# Patient Record
Sex: Male | Born: 1955 | Race: Black or African American | Hispanic: No | Marital: Married | Smoking: Former smoker
Health system: Southern US, Community
[De-identification: ages and names within clinical notes are randomized; demographics above are authoritative.]

## PROBLEM LIST (undated history)

## (undated) DIAGNOSIS — I219 Acute myocardial infarction, unspecified: Secondary | ICD-10-CM

## (undated) DIAGNOSIS — I509 Heart failure, unspecified: Secondary | ICD-10-CM

## (undated) DIAGNOSIS — I2510 Atherosclerotic heart disease of native coronary artery without angina pectoris: Secondary | ICD-10-CM

## (undated) DIAGNOSIS — J449 Chronic obstructive pulmonary disease, unspecified: Secondary | ICD-10-CM

## (undated) DIAGNOSIS — C801 Malignant (primary) neoplasm, unspecified: Secondary | ICD-10-CM

## (undated) DIAGNOSIS — R0602 Shortness of breath: Secondary | ICD-10-CM

## (undated) DIAGNOSIS — I10 Essential (primary) hypertension: Secondary | ICD-10-CM

## (undated) HISTORY — PX: CORONARY ANGIOPLASTY WITH STENT PLACEMENT: SHX49

---

## 2003-11-16 HISTORY — PX: PNEUMONECTOMY: SHX168

## 2003-11-16 HISTORY — PX: OTHER SURGICAL HISTORY: SHX169

## 2004-06-08 ENCOUNTER — Ambulatory Visit (HOSPITAL_COMMUNITY): Admission: RE | Admit: 2004-06-08 | Discharge: 2004-06-08 | Payer: Self-pay | Admitting: Internal Medicine

## 2004-08-10 ENCOUNTER — Encounter: Admission: RE | Admit: 2004-08-10 | Discharge: 2004-08-10 | Payer: Self-pay | Admitting: Nephrology

## 2004-10-22 ENCOUNTER — Ambulatory Visit (HOSPITAL_COMMUNITY): Admission: RE | Admit: 2004-10-22 | Discharge: 2004-10-22 | Payer: Self-pay | Admitting: Internal Medicine

## 2004-12-02 ENCOUNTER — Ambulatory Visit (HOSPITAL_COMMUNITY): Admission: RE | Admit: 2004-12-02 | Discharge: 2004-12-02 | Payer: Self-pay | Admitting: Internal Medicine

## 2004-12-10 ENCOUNTER — Ambulatory Visit: Payer: Self-pay | Admitting: Internal Medicine

## 2005-02-14 ENCOUNTER — Emergency Department (HOSPITAL_COMMUNITY): Admission: EM | Admit: 2005-02-14 | Discharge: 2005-02-14 | Payer: Self-pay | Admitting: Emergency Medicine

## 2005-03-24 ENCOUNTER — Ambulatory Visit: Payer: Self-pay | Admitting: Internal Medicine

## 2005-04-02 ENCOUNTER — Ambulatory Visit (HOSPITAL_COMMUNITY): Admission: RE | Admit: 2005-04-02 | Discharge: 2005-04-02 | Payer: Self-pay | Admitting: Internal Medicine

## 2005-07-23 ENCOUNTER — Ambulatory Visit: Payer: Self-pay | Admitting: Internal Medicine

## 2005-08-28 ENCOUNTER — Emergency Department (HOSPITAL_COMMUNITY): Admission: EM | Admit: 2005-08-28 | Discharge: 2005-08-29 | Payer: Self-pay | Admitting: Emergency Medicine

## 2005-08-29 ENCOUNTER — Emergency Department (HOSPITAL_COMMUNITY): Admission: EM | Admit: 2005-08-29 | Discharge: 2005-08-29 | Payer: Self-pay | Admitting: Emergency Medicine

## 2005-08-31 ENCOUNTER — Ambulatory Visit (HOSPITAL_COMMUNITY): Admission: RE | Admit: 2005-08-31 | Discharge: 2005-08-31 | Payer: Self-pay | Admitting: Nephrology

## 2005-09-02 ENCOUNTER — Emergency Department (HOSPITAL_COMMUNITY): Admission: EM | Admit: 2005-09-02 | Discharge: 2005-09-02 | Payer: Self-pay | Admitting: Emergency Medicine

## 2005-09-04 ENCOUNTER — Inpatient Hospital Stay (HOSPITAL_COMMUNITY): Admission: EM | Admit: 2005-09-04 | Discharge: 2005-09-09 | Payer: Self-pay | Admitting: Emergency Medicine

## 2005-09-07 ENCOUNTER — Encounter (INDEPENDENT_AMBULATORY_CARE_PROVIDER_SITE_OTHER): Payer: Self-pay | Admitting: Specialist

## 2005-09-13 ENCOUNTER — Ambulatory Visit: Payer: Self-pay | Admitting: Internal Medicine

## 2005-10-01 ENCOUNTER — Ambulatory Visit (HOSPITAL_COMMUNITY): Admission: RE | Admit: 2005-10-01 | Discharge: 2005-10-01 | Payer: Self-pay | Admitting: Internal Medicine

## 2006-03-07 ENCOUNTER — Ambulatory Visit: Payer: Self-pay | Admitting: Internal Medicine

## 2006-05-11 ENCOUNTER — Ambulatory Visit: Payer: Self-pay | Admitting: Internal Medicine

## 2006-05-20 ENCOUNTER — Ambulatory Visit (HOSPITAL_COMMUNITY): Admission: RE | Admit: 2006-05-20 | Discharge: 2006-05-20 | Payer: Self-pay | Admitting: Internal Medicine

## 2006-05-23 LAB — COMPREHENSIVE METABOLIC PANEL
ALT: 10 U/L (ref 0–40)
AST: 14 U/L (ref 0–37)
Alkaline Phosphatase: 64 U/L (ref 39–117)
CO2: 24 mEq/L (ref 19–32)
Sodium: 142 mEq/L (ref 135–145)
Total Bilirubin: 0.4 mg/dL (ref 0.3–1.2)
Total Protein: 7 g/dL (ref 6.0–8.3)

## 2006-05-23 LAB — CBC WITH DIFFERENTIAL/PLATELET
BASO%: 0.4 % (ref 0.0–2.0)
EOS%: 1.3 % (ref 0.0–7.0)
LYMPH%: 23.9 % (ref 14.0–48.0)
MCHC: 33.8 g/dL (ref 32.0–35.9)
MONO#: 0.5 10*3/uL (ref 0.1–0.9)
MONO%: 7.7 % (ref 0.0–13.0)
Platelets: 308 10*3/uL (ref 145–400)
RBC: 4.67 10*6/uL (ref 4.20–5.71)
WBC: 6.8 10*3/uL (ref 4.0–10.0)

## 2006-05-30 ENCOUNTER — Ambulatory Visit (HOSPITAL_COMMUNITY): Admission: RE | Admit: 2006-05-30 | Discharge: 2006-05-30 | Payer: Self-pay | Admitting: Internal Medicine

## 2006-11-16 ENCOUNTER — Ambulatory Visit: Payer: Self-pay | Admitting: Internal Medicine

## 2007-01-09 ENCOUNTER — Ambulatory Visit: Payer: Self-pay | Admitting: Internal Medicine

## 2007-01-11 LAB — COMPREHENSIVE METABOLIC PANEL
ALT: 8 U/L (ref 0–53)
Alkaline Phosphatase: 75 U/L (ref 39–117)
Creatinine, Ser: 1.08 mg/dL (ref 0.40–1.50)
Glucose, Bld: 91 mg/dL (ref 70–99)
Sodium: 137 mEq/L (ref 135–145)
Total Bilirubin: 0.4 mg/dL (ref 0.3–1.2)
Total Protein: 7.6 g/dL (ref 6.0–8.3)

## 2007-01-11 LAB — CBC WITH DIFFERENTIAL/PLATELET
BASO%: 0.3 % (ref 0.0–2.0)
LYMPH%: 24.7 % (ref 14.0–48.0)
MCHC: 35.4 g/dL (ref 32.0–35.9)
MCV: 87.2 fL (ref 81.6–98.0)
MONO%: 9.2 % (ref 0.0–13.0)
NEUT%: 64 % (ref 40.0–75.0)
Platelets: 270 10*3/uL (ref 145–400)
RBC: 5.01 10*6/uL (ref 4.20–5.71)

## 2007-10-09 ENCOUNTER — Ambulatory Visit: Payer: Self-pay | Admitting: Internal Medicine

## 2007-10-09 LAB — COMPREHENSIVE METABOLIC PANEL
Alkaline Phosphatase: 73 U/L (ref 39–117)
BUN: 22 mg/dL (ref 6–23)
CO2: 22 mEq/L (ref 19–32)
Creatinine, Ser: 1.09 mg/dL (ref 0.40–1.50)
Glucose, Bld: 69 mg/dL — ABNORMAL LOW (ref 70–99)
Sodium: 138 mEq/L (ref 135–145)
Total Bilirubin: 0.5 mg/dL (ref 0.3–1.2)
Total Protein: 7.6 g/dL (ref 6.0–8.3)

## 2007-10-09 LAB — CBC WITH DIFFERENTIAL/PLATELET
Eosinophils Absolute: 0.2 10*3/uL (ref 0.0–0.5)
HCT: 42.1 % (ref 38.7–49.9)
LYMPH%: 33.1 % (ref 14.0–48.0)
MCV: 86.8 fL (ref 81.6–98.0)
MONO#: 0.7 10*3/uL (ref 0.1–0.9)
MONO%: 9.8 % (ref 0.0–13.0)
NEUT#: 3.5 10*3/uL (ref 1.5–6.5)
NEUT%: 52.1 % (ref 40.0–75.0)
Platelets: 291 10*3/uL (ref 145–400)
RBC: 4.85 10*6/uL (ref 4.20–5.71)

## 2007-10-17 ENCOUNTER — Ambulatory Visit (HOSPITAL_COMMUNITY): Admission: RE | Admit: 2007-10-17 | Discharge: 2007-10-17 | Payer: Self-pay | Admitting: Internal Medicine

## 2007-11-29 ENCOUNTER — Encounter: Admission: RE | Admit: 2007-11-29 | Discharge: 2007-11-29 | Payer: Self-pay | Admitting: Nephrology

## 2008-04-11 ENCOUNTER — Ambulatory Visit: Payer: Self-pay | Admitting: Internal Medicine

## 2008-04-16 ENCOUNTER — Ambulatory Visit (HOSPITAL_COMMUNITY): Admission: RE | Admit: 2008-04-16 | Discharge: 2008-04-16 | Payer: Self-pay | Admitting: Internal Medicine

## 2008-04-16 LAB — COMPREHENSIVE METABOLIC PANEL
ALT: 14 U/L (ref 0–53)
CO2: 27 mEq/L (ref 19–32)
Chloride: 103 mEq/L (ref 96–112)
Sodium: 138 mEq/L (ref 135–145)
Total Bilirubin: 0.8 mg/dL (ref 0.3–1.2)
Total Protein: 7.6 g/dL (ref 6.0–8.3)

## 2008-04-16 LAB — CBC WITH DIFFERENTIAL/PLATELET
BASO%: 0.5 % (ref 0.0–2.0)
LYMPH%: 32 % (ref 14.0–48.0)
MCHC: 34.5 g/dL (ref 32.0–35.9)
MONO#: 0.6 10*3/uL (ref 0.1–0.9)
RBC: 4.84 10*6/uL (ref 4.20–5.71)
RDW: 14.7 % — ABNORMAL HIGH (ref 11.2–14.6)
WBC: 7.2 10*3/uL (ref 4.0–10.0)
lymph#: 2.3 10*3/uL (ref 0.9–3.3)

## 2008-05-13 ENCOUNTER — Emergency Department (HOSPITAL_COMMUNITY): Admission: EM | Admit: 2008-05-13 | Discharge: 2008-05-13 | Payer: Self-pay | Admitting: Emergency Medicine

## 2008-05-16 ENCOUNTER — Inpatient Hospital Stay (HOSPITAL_COMMUNITY): Admission: EM | Admit: 2008-05-16 | Discharge: 2008-05-23 | Payer: Self-pay | Admitting: Emergency Medicine

## 2008-05-31 ENCOUNTER — Ambulatory Visit: Payer: Self-pay | Admitting: Internal Medicine

## 2008-06-04 LAB — COMPREHENSIVE METABOLIC PANEL
ALT: 20 U/L (ref 0–53)
Albumin: 3.7 g/dL (ref 3.5–5.2)
CO2: 24 mEq/L (ref 19–32)
Calcium: 8.6 mg/dL (ref 8.4–10.5)
Chloride: 105 mEq/L (ref 96–112)
Sodium: 136 mEq/L (ref 135–145)
Total Protein: 6.7 g/dL (ref 6.0–8.3)

## 2008-06-04 LAB — CBC WITH DIFFERENTIAL/PLATELET
BASO%: 0.4 % (ref 0.0–2.0)
HCT: 35 % — ABNORMAL LOW (ref 38.7–49.9)
MCHC: 34.6 g/dL (ref 32.0–35.9)
MONO#: 1 10*3/uL — ABNORMAL HIGH (ref 0.1–0.9)
NEUT#: 9.4 10*3/uL — ABNORMAL HIGH (ref 1.5–6.5)
NEUT%: 76.9 % — ABNORMAL HIGH (ref 40.0–75.0)
RBC: 4.04 10*6/uL — ABNORMAL LOW (ref 4.20–5.71)
WBC: 12.3 10*3/uL — ABNORMAL HIGH (ref 4.0–10.0)
lymph#: 1.7 10*3/uL (ref 0.9–3.3)

## 2008-06-12 ENCOUNTER — Ambulatory Visit (HOSPITAL_COMMUNITY): Admission: RE | Admit: 2008-06-12 | Discharge: 2008-06-12 | Payer: Self-pay | Admitting: Internal Medicine

## 2008-06-17 LAB — CBC WITH DIFFERENTIAL/PLATELET
BASO%: 0.3 % (ref 0.0–2.0)
Basophils Absolute: 0 10*3/uL (ref 0.0–0.1)
Eosinophils Absolute: 0.2 10*3/uL (ref 0.0–0.5)
HCT: 39.3 % (ref 38.7–49.9)
HGB: 13.3 g/dL (ref 13.0–17.1)
MCHC: 33.9 g/dL (ref 32.0–35.9)
MONO#: 0.6 10*3/uL (ref 0.1–0.9)
NEUT#: 5.4 10*3/uL (ref 1.5–6.5)
NEUT%: 67 % (ref 40.0–75.0)
Platelets: 356 10*3/uL (ref 145–400)
WBC: 8.1 10*3/uL (ref 4.0–10.0)
lymph#: 1.9 10*3/uL (ref 0.9–3.3)

## 2008-06-17 LAB — COMPREHENSIVE METABOLIC PANEL
ALT: 16 U/L (ref 0–53)
CO2: 21 mEq/L (ref 19–32)
Calcium: 9.4 mg/dL (ref 8.4–10.5)
Chloride: 105 mEq/L (ref 96–112)
Creatinine, Ser: 1.24 mg/dL (ref 0.40–1.50)
Glucose, Bld: 107 mg/dL — ABNORMAL HIGH (ref 70–99)

## 2008-08-07 ENCOUNTER — Emergency Department (HOSPITAL_COMMUNITY): Admission: EM | Admit: 2008-08-07 | Discharge: 2008-08-07 | Payer: Self-pay | Admitting: Emergency Medicine

## 2008-09-25 ENCOUNTER — Encounter (INDEPENDENT_AMBULATORY_CARE_PROVIDER_SITE_OTHER): Payer: Self-pay | Admitting: Cardiovascular Disease

## 2008-09-25 ENCOUNTER — Ambulatory Visit (HOSPITAL_COMMUNITY): Admission: RE | Admit: 2008-09-25 | Discharge: 2008-09-25 | Payer: Self-pay | Admitting: Cardiovascular Disease

## 2008-12-09 ENCOUNTER — Ambulatory Visit: Payer: Self-pay | Admitting: Internal Medicine

## 2008-12-12 ENCOUNTER — Ambulatory Visit (HOSPITAL_COMMUNITY): Admission: RE | Admit: 2008-12-12 | Discharge: 2008-12-12 | Payer: Self-pay | Admitting: Internal Medicine

## 2008-12-12 LAB — CBC WITH DIFFERENTIAL/PLATELET
Basophils Absolute: 0 10*3/uL (ref 0.0–0.1)
HCT: 41.1 % (ref 38.7–49.9)
HGB: 14.1 g/dL (ref 13.0–17.1)
LYMPH%: 20.8 % (ref 14.0–48.0)
MCHC: 34.2 g/dL (ref 32.0–35.9)
MONO#: 0.6 10*3/uL (ref 0.1–0.9)
NEUT%: 68.2 % (ref 40.0–75.0)
Platelets: 294 10*3/uL (ref 145–400)
WBC: 6.7 10*3/uL (ref 4.0–10.0)
lymph#: 1.4 10*3/uL (ref 0.9–3.3)

## 2008-12-12 LAB — COMPREHENSIVE METABOLIC PANEL
BUN: 11 mg/dL (ref 6–23)
CO2: 27 mEq/L (ref 19–32)
Calcium: 8.7 mg/dL (ref 8.4–10.5)
Chloride: 99 mEq/L (ref 96–112)
Creatinine, Ser: 1.06 mg/dL (ref 0.40–1.50)
Glucose, Bld: 97 mg/dL (ref 70–99)
Total Bilirubin: 0.9 mg/dL (ref 0.3–1.2)

## 2008-12-20 ENCOUNTER — Emergency Department (HOSPITAL_COMMUNITY): Admission: EM | Admit: 2008-12-20 | Discharge: 2008-12-21 | Payer: Self-pay | Admitting: Emergency Medicine

## 2008-12-25 ENCOUNTER — Ambulatory Visit: Payer: Self-pay | Admitting: Thoracic Surgery

## 2008-12-27 ENCOUNTER — Ambulatory Visit: Payer: Self-pay | Admitting: Thoracic Surgery

## 2008-12-27 ENCOUNTER — Ambulatory Visit (HOSPITAL_COMMUNITY): Admission: RE | Admit: 2008-12-27 | Discharge: 2008-12-27 | Payer: Self-pay | Admitting: Thoracic Surgery

## 2008-12-27 ENCOUNTER — Encounter: Payer: Self-pay | Admitting: Thoracic Surgery

## 2008-12-31 ENCOUNTER — Ambulatory Visit: Payer: Self-pay | Admitting: Thoracic Surgery

## 2009-02-27 ENCOUNTER — Encounter (HOSPITAL_COMMUNITY): Admission: RE | Admit: 2009-02-27 | Discharge: 2009-05-28 | Payer: Self-pay | Admitting: Cardiology

## 2009-04-11 ENCOUNTER — Ambulatory Visit: Payer: Self-pay | Admitting: Internal Medicine

## 2009-04-16 LAB — CBC WITH DIFFERENTIAL/PLATELET
BASO%: 0.9 % (ref 0.0–2.0)
Basophils Absolute: 0.1 10*3/uL (ref 0.0–0.1)
EOS%: 0.9 % (ref 0.0–7.0)
HCT: 42.5 % (ref 38.4–49.9)
HGB: 14.6 g/dL (ref 13.0–17.1)
LYMPH%: 24 % (ref 14.0–49.0)
MCH: 30.3 pg (ref 27.2–33.4)
MCHC: 34.3 g/dL (ref 32.0–36.0)
MCV: 88.3 fL (ref 79.3–98.0)
NEUT%: 68.8 % (ref 39.0–75.0)
Platelets: 302 10*3/uL (ref 140–400)

## 2009-04-16 LAB — COMPREHENSIVE METABOLIC PANEL
ALT: 9 U/L (ref 0–53)
AST: 15 U/L (ref 0–37)
BUN: 12 mg/dL (ref 6–23)
Calcium: 9.1 mg/dL (ref 8.4–10.5)
Chloride: 106 mEq/L (ref 96–112)
Creatinine, Ser: 1.03 mg/dL (ref 0.40–1.50)
Total Bilirubin: 0.4 mg/dL (ref 0.3–1.2)

## 2009-06-10 ENCOUNTER — Ambulatory Visit: Payer: Self-pay | Admitting: Internal Medicine

## 2009-06-13 ENCOUNTER — Ambulatory Visit (HOSPITAL_COMMUNITY): Admission: RE | Admit: 2009-06-13 | Discharge: 2009-06-13 | Payer: Self-pay | Admitting: Internal Medicine

## 2009-06-13 LAB — CBC WITH DIFFERENTIAL/PLATELET
BASO%: 0.5 % (ref 0.0–2.0)
EOS%: 1.8 % (ref 0.0–7.0)
HCT: 41.9 % (ref 38.4–49.9)
LYMPH%: 27.1 % (ref 14.0–49.0)
MCH: 29.8 pg (ref 27.2–33.4)
MCHC: 33.6 g/dL (ref 32.0–36.0)
MCV: 88.6 fL (ref 79.3–98.0)
MONO%: 7.8 % (ref 0.0–14.0)
NEUT%: 62.8 % (ref 39.0–75.0)
lymph#: 1.8 10*3/uL (ref 0.9–3.3)

## 2009-06-13 LAB — COMPREHENSIVE METABOLIC PANEL
ALT: 10 U/L (ref 0–53)
AST: 18 U/L (ref 0–37)
Alkaline Phosphatase: 61 U/L (ref 39–117)
BUN: 12 mg/dL (ref 6–23)
Chloride: 106 mEq/L (ref 96–112)
Creatinine, Ser: 1 mg/dL (ref 0.40–1.50)
Total Bilirubin: 0.7 mg/dL (ref 0.3–1.2)

## 2009-07-28 ENCOUNTER — Emergency Department (HOSPITAL_COMMUNITY): Admission: EM | Admit: 2009-07-28 | Discharge: 2009-07-28 | Payer: Self-pay | Admitting: Emergency Medicine

## 2009-11-05 ENCOUNTER — Inpatient Hospital Stay (HOSPITAL_COMMUNITY): Admission: EM | Admit: 2009-11-05 | Discharge: 2009-11-09 | Payer: Self-pay | Admitting: Emergency Medicine

## 2009-12-09 ENCOUNTER — Emergency Department (HOSPITAL_COMMUNITY): Admission: EM | Admit: 2009-12-09 | Discharge: 2009-12-09 | Payer: Self-pay | Admitting: Emergency Medicine

## 2010-03-13 ENCOUNTER — Encounter: Admission: RE | Admit: 2010-03-13 | Discharge: 2010-03-13 | Payer: Self-pay | Admitting: Nephrology

## 2010-03-18 ENCOUNTER — Encounter (HOSPITAL_COMMUNITY): Admission: RE | Admit: 2010-03-18 | Discharge: 2010-05-15 | Payer: Self-pay | Admitting: Cardiology

## 2010-04-17 ENCOUNTER — Ambulatory Visit: Payer: Self-pay | Admitting: Internal Medicine

## 2010-04-20 ENCOUNTER — Ambulatory Visit (HOSPITAL_COMMUNITY): Admission: RE | Admit: 2010-04-20 | Discharge: 2010-04-20 | Payer: Self-pay | Admitting: Internal Medicine

## 2010-04-20 LAB — CBC WITH DIFFERENTIAL/PLATELET
Basophils Absolute: 0 10*3/uL (ref 0.0–0.1)
Eosinophils Absolute: 0.1 10*3/uL (ref 0.0–0.5)
HCT: 42.5 % (ref 38.4–49.9)
LYMPH%: 27.4 % (ref 14.0–49.0)
MCV: 90.1 fL (ref 79.3–98.0)
MONO#: 0.5 10*3/uL (ref 0.1–0.9)
NEUT#: 5.1 10*3/uL (ref 1.5–6.5)
NEUT%: 65.3 % (ref 39.0–75.0)
Platelets: 250 10*3/uL (ref 140–400)
WBC: 7.8 10*3/uL (ref 4.0–10.3)

## 2010-04-20 LAB — COMPREHENSIVE METABOLIC PANEL
BUN: 8 mg/dL (ref 6–23)
CO2: 23 mEq/L (ref 19–32)
Creatinine, Ser: 1.05 mg/dL (ref 0.40–1.50)
Glucose, Bld: 87 mg/dL (ref 70–99)
Total Bilirubin: 1.1 mg/dL (ref 0.3–1.2)
Total Protein: 8 g/dL (ref 6.0–8.3)

## 2010-09-04 ENCOUNTER — Emergency Department (HOSPITAL_COMMUNITY): Admission: EM | Admit: 2010-09-04 | Discharge: 2010-09-05 | Payer: Self-pay | Admitting: Emergency Medicine

## 2010-12-04 ENCOUNTER — Other Ambulatory Visit: Payer: Self-pay | Admitting: Internal Medicine

## 2010-12-04 DIAGNOSIS — C3490 Malignant neoplasm of unspecified part of unspecified bronchus or lung: Secondary | ICD-10-CM

## 2010-12-05 ENCOUNTER — Encounter: Payer: Self-pay | Admitting: Internal Medicine

## 2010-12-06 ENCOUNTER — Encounter: Payer: Self-pay | Admitting: Internal Medicine

## 2010-12-19 IMAGING — CT CT CHEST W/ CM
1 of 2 series · 14 of 32 positions shown, 18 images · IV contrast (agent unspecified)
Comparison: 11/05/2009

CLINICAL DATA: Lung cancer.

CT CHEST WITH CONTRAST
TECHNIQUE: Multidetector CT imaging of the chest was performed
following the standard protocol during bolus administration of
intravenous contrast.
Contrast: 80 ml [id number]

[Series 2: rtn chest with st · axial · 0.65mm/px · z∈[+1572,+1836]mm · 14 of 63 slices shown, 18 images]
[im 5/63  mediastinal]
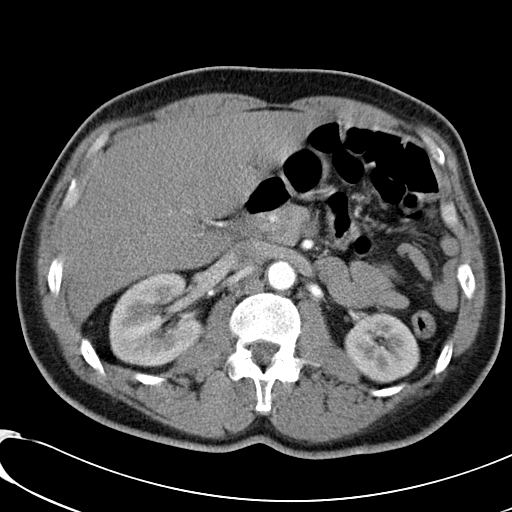
[im 5/63  lung]
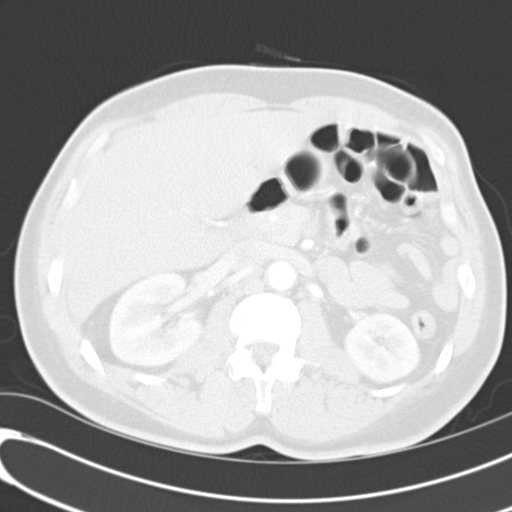
[im 10/63  lung]
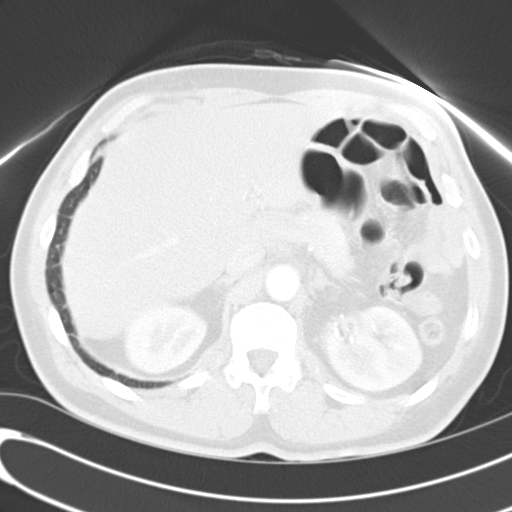
[im 15/63  lung]
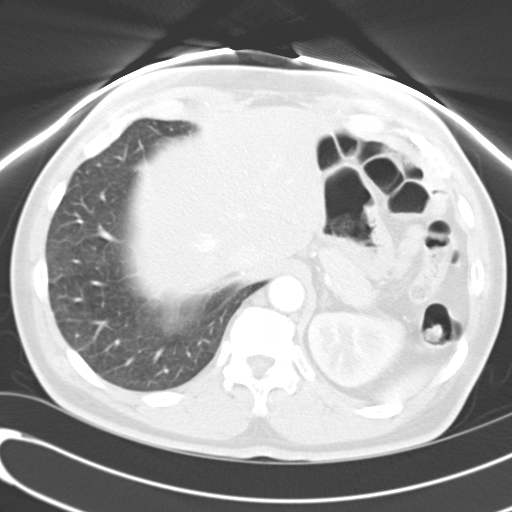
[im 20/63  lung]
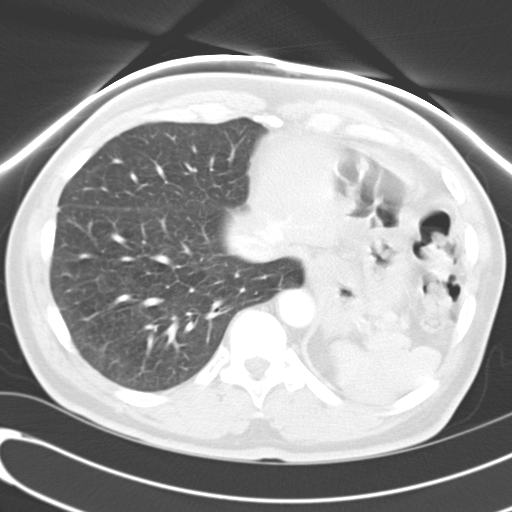
[im 24/63  mediastinal]
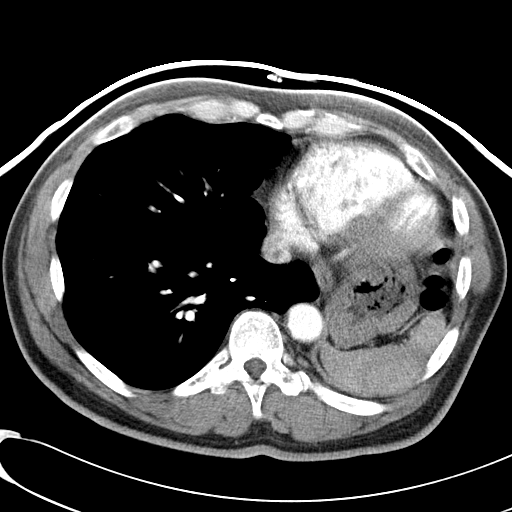
[im 24/63  lung]
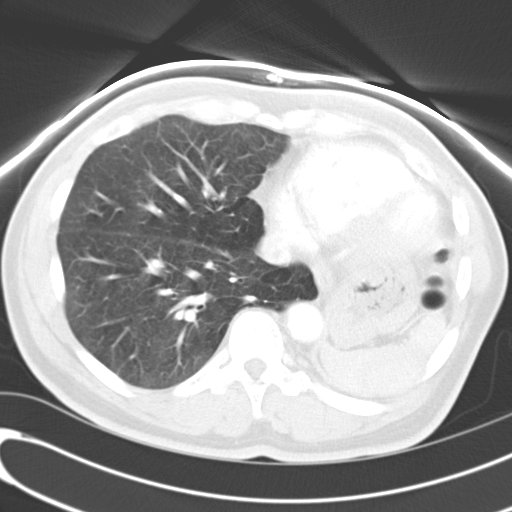
[im 29/63  lung]
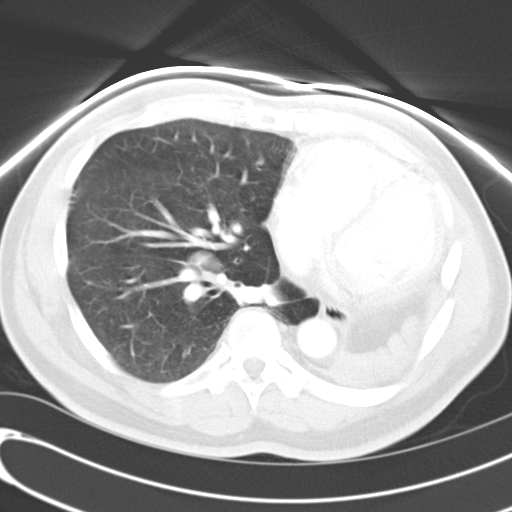
[im 30/63  lung]
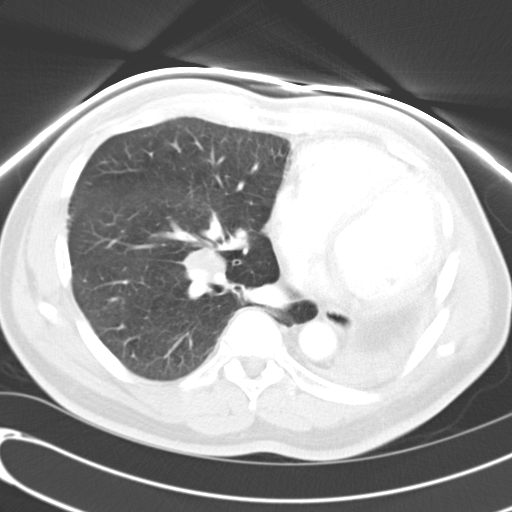
[im 32/63  lung]
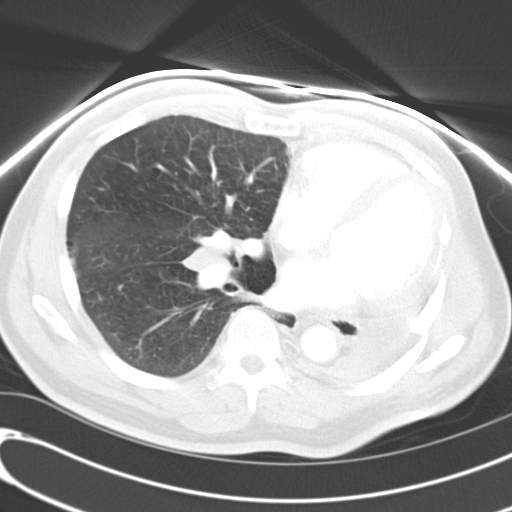
[im 34/63  mediastinal]
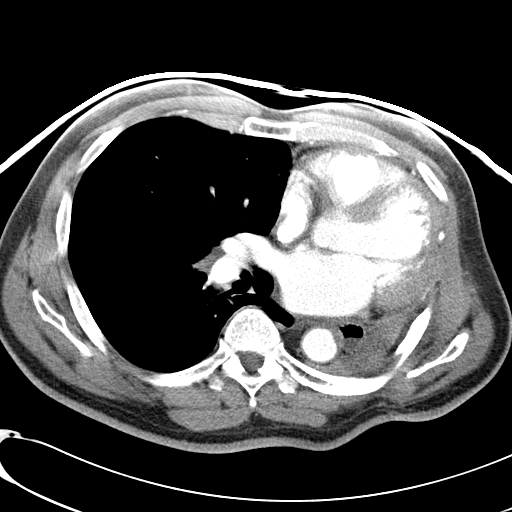
[im 34/63  lung]
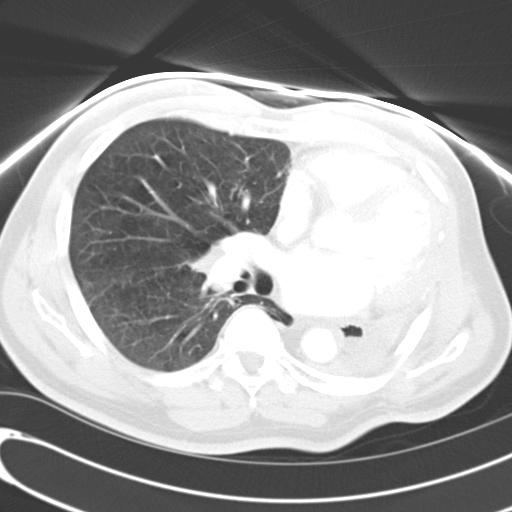
[im 39/63  lung]
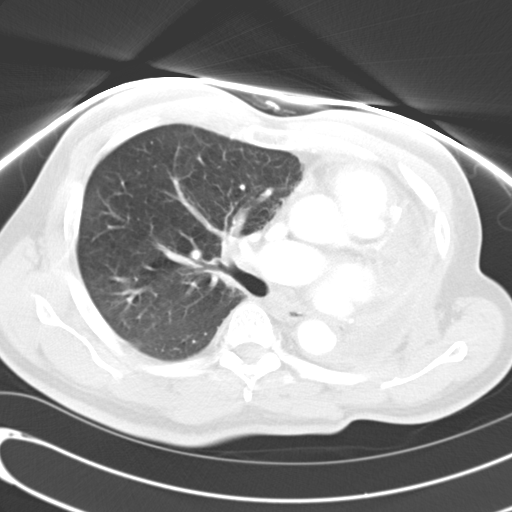
[im 43/63  lung]
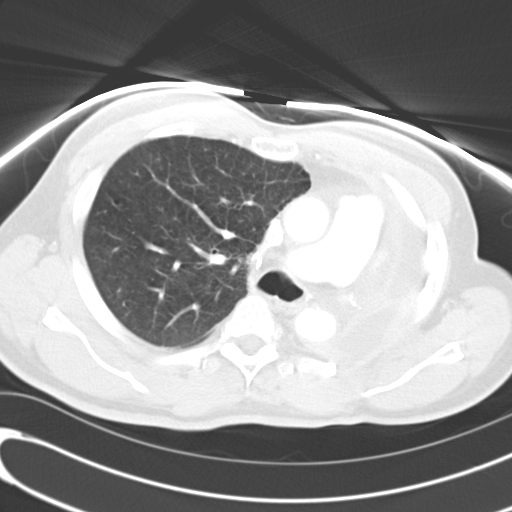
[im 48/63  lung]
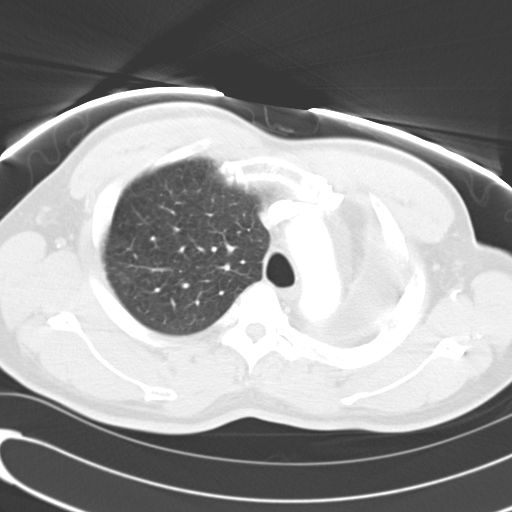
[im 53/63  mediastinal]
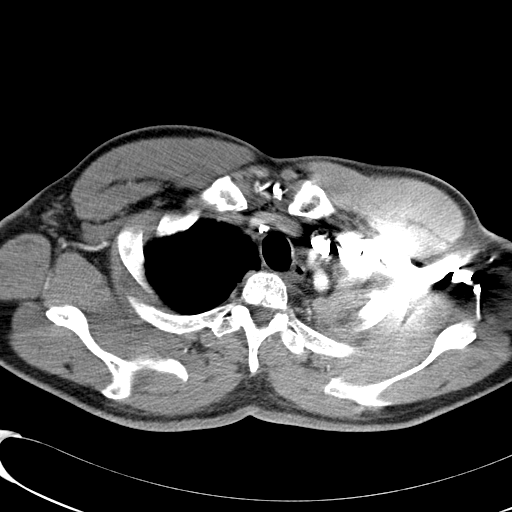
[im 53/63  lung]
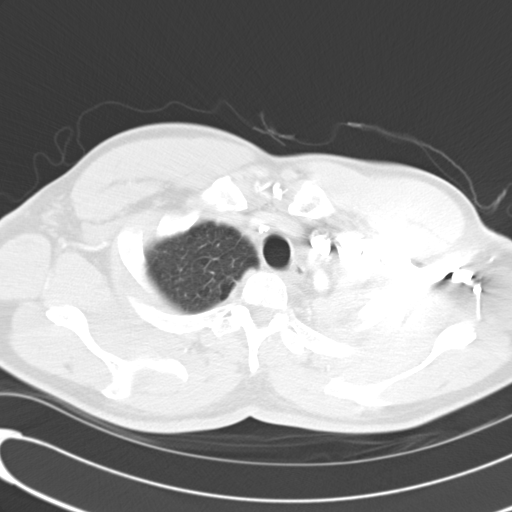
[im 58/63  lung]
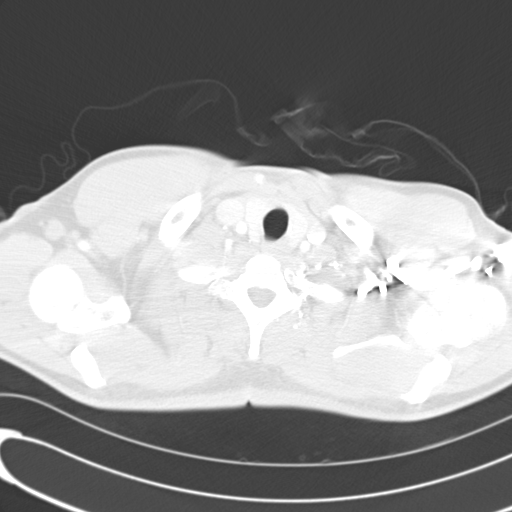

[14 of 32 positions shown; findings below may reference images not displayed]

FINDINGS: Right infrahilar adenopathy measures 1.2 x 2.8 cm
(previously 2.2 x 3.4 cm).  No mediastinal or axillary adenopathy.
Heart is at the upper limits of normal in size, with postoperative
shift of the heart and mediastinum to the left.  No pericardial
effusion. Pulmonary arteries are enlarged.

There are postoperative changes of left pneumonectomy.
Centrilobular emphysema.  Mild compensatory hyperexpansion of the
right lung.  Minimal septal thickening in the lower right
hemithorax.  No right pleural effusion.  Airway is otherwise
unremarkable.

Incidental imaging of the upper abdomen shows no acute findings.
No worrisome lytic or sclerotic lesions.
IMPRESSION: 1.  Postoperative changes of left pneumonectomy without evidence of
recurrent or metastatic disease.
2.  Decrease in the size of right infrahilar adenopathy.
3.  Cannot exclude mild pulmonary edema.
4.  Pulmonary arterial hypertension.

## 2011-01-27 LAB — DIFFERENTIAL
Basophils Absolute: 0 10*3/uL (ref 0.0–0.1)
Eosinophils Relative: 1 % (ref 0–5)
Lymphocytes Relative: 23 % (ref 12–46)
Lymphs Abs: 2 10*3/uL (ref 0.7–4.0)
Neutro Abs: 6.3 10*3/uL (ref 1.7–7.7)
Neutrophils Relative %: 70 % (ref 43–77)

## 2011-01-27 LAB — CBC
HCT: 42.7 % (ref 39.0–52.0)
RDW: 15.4 % (ref 11.5–15.5)
WBC: 9 10*3/uL (ref 4.0–10.5)

## 2011-01-27 LAB — COMPREHENSIVE METABOLIC PANEL
AST: 24 U/L (ref 0–37)
Albumin: 3.7 g/dL (ref 3.5–5.2)
Alkaline Phosphatase: 65 U/L (ref 39–117)
BUN: 12 mg/dL (ref 6–23)
Chloride: 104 mEq/L (ref 96–112)
GFR calc non Af Amer: >60 mL/min (ref >60)
Potassium: 3.5 mEq/L (ref 3.5–5.1)

## 2011-01-27 LAB — URINALYSIS, ROUTINE W REFLEX MICROSCOPIC
Glucose, UA: NEGATIVE mg/dL
Protein, ur: NEGATIVE mg/dL
Specific Gravity, Urine: 1.011 (ref 1.005–1.030)
pH: 5.5 (ref 5.0–8.0)

## 2011-01-27 LAB — POCT CARDIAC MARKERS: CKMB, poc: 1.4 ng/mL (ref 1.0–8.0)

## 2011-01-31 LAB — GC/CHLAMYDIA PROBE AMP, GENITAL: Chlamydia, DNA Probe: NEGATIVE

## 2011-02-15 LAB — CBC
HCT: 38.6 % — ABNORMAL LOW (ref 39.0–52.0)
HCT: 39.3 % (ref 39.0–52.0)
HCT: 39.7 % (ref 39.0–52.0)
Hemoglobin: 13.2 g/dL (ref 13.0–17.0)
Hemoglobin: 13.3 g/dL (ref 13.0–17.0)
Hemoglobin: 13.5 g/dL (ref 13.0–17.0)
MCHC: 33.7 g/dL (ref 30.0–36.0)
MCHC: 33.9 g/dL (ref 30.0–36.0)
MCHC: 34.2 g/dL (ref 30.0–36.0)
MCV: 90 fL (ref 78.0–100.0)
MCV: 90.1 fL (ref 78.0–100.0)
MCV: 90.2 fL (ref 78.0–100.0)
Platelets: 226 10*3/uL (ref 150–400)
Platelets: 246 10*3/uL (ref 150–400)
Platelets: 257 10*3/uL (ref 150–400)
RBC: 4.3 MIL/uL (ref 4.22–5.81)
RBC: 4.46 MIL/uL (ref 4.22–5.81)
RDW: 15.6 % — ABNORMAL HIGH (ref 11.5–15.5)
RDW: 15.6 % — ABNORMAL HIGH (ref 11.5–15.5)
RDW: 15.7 % — ABNORMAL HIGH (ref 11.5–15.5)
WBC: 6.3 10*3/uL (ref 4.0–10.5)
WBC: 6.9 10*3/uL (ref 4.0–10.5)

## 2011-02-15 LAB — RENAL FUNCTION PANEL
Albumin: 3.3 g/dL — ABNORMAL LOW (ref 3.5–5.2)
BUN: 13 mg/dL (ref 6–23)
BUN: 14 mg/dL (ref 6–23)
CO2: 29 mEq/L (ref 19–32)
Calcium: 8.8 mg/dL (ref 8.4–10.5)
Calcium: 8.8 mg/dL (ref 8.4–10.5)
Creatinine, Ser: 1.05 mg/dL (ref 0.4–1.5)
GFR calc Af Amer: >60 mL/min (ref >60)
GFR calc non Af Amer: >60 mL/min (ref >60)
Glucose, Bld: 97 mg/dL (ref 70–99)
Phosphorus: 4.4 mg/dL (ref 2.3–4.6)
Phosphorus: 4.6 mg/dL (ref 2.3–4.6)
Sodium: 138 mEq/L (ref 135–145)

## 2011-02-15 LAB — POCT CARDIAC MARKERS: Myoglobin, poc: 46.4 ng/mL (ref 12–200)

## 2011-02-15 LAB — DIFFERENTIAL
Basophils Absolute: 0 10*3/uL (ref 0.0–0.1)
Basophils Absolute: 0 10*3/uL (ref 0.0–0.1)
Basophils Relative: 0 % (ref 0–1)
Eosinophils Absolute: 0.2 10*3/uL (ref 0.0–0.7)
Eosinophils Relative: 1 % (ref 0–5)
Eosinophils Relative: 2 % (ref 0–5)
Eosinophils Relative: 2 % (ref 0–5)
Lymphocytes Relative: 27 % (ref 12–46)
Lymphocytes Relative: 27 % (ref 12–46)
Lymphocytes Relative: 31 % (ref 12–46)
Lymphs Abs: 1.9 10*3/uL (ref 0.7–4.0)
Monocytes Absolute: 0.6 10*3/uL (ref 0.1–1.0)
Monocytes Absolute: 0.7 10*3/uL (ref 0.1–1.0)
Monocytes Relative: 10 % (ref 3–12)

## 2011-02-15 LAB — BASIC METABOLIC PANEL
BUN: 11 mg/dL (ref 6–23)
GFR calc non Af Amer: >60 mL/min (ref >60)
Potassium: 4.2 mEq/L (ref 3.5–5.1)
Sodium: 139 mEq/L (ref 135–145)

## 2011-02-15 LAB — COMPREHENSIVE METABOLIC PANEL
AST: 29 U/L (ref 0–37)
Albumin: 3.1 g/dL — ABNORMAL LOW (ref 3.5–5.2)
Calcium: 8.6 mg/dL (ref 8.4–10.5)
Creatinine, Ser: 1.07 mg/dL (ref 0.4–1.5)
GFR calc Af Amer: >60 mL/min (ref >60)
Total Protein: 6.6 g/dL (ref 6.0–8.3)

## 2011-02-15 LAB — URINALYSIS, ROUTINE W REFLEX MICROSCOPIC
Glucose, UA: NEGATIVE mg/dL
Hgb urine dipstick: NEGATIVE
Ketones, ur: NEGATIVE mg/dL
Protein, ur: NEGATIVE mg/dL
pH: 5 (ref 5.0–8.0)

## 2011-02-15 LAB — APTT: aPTT: 33 seconds (ref 24–37)

## 2011-02-15 LAB — PROTIME-INR
INR: 1.17 (ref 0.00–1.49)
Prothrombin Time: 14.8 seconds (ref 11.6–15.2)

## 2011-02-15 LAB — BRAIN NATRIURETIC PEPTIDE
Pro B Natriuretic peptide (BNP): 396 pg/mL — ABNORMAL HIGH (ref 0.0–100.0)
Pro B Natriuretic peptide (BNP): 542 pg/mL — ABNORMAL HIGH (ref 0.0–100.0)

## 2011-02-19 LAB — DIFFERENTIAL
Basophils Absolute: 0 10*3/uL (ref 0.0–0.1)
Basophils Relative: 1 % (ref 0–1)
Eosinophils Absolute: 0.2 10*3/uL (ref 0.0–0.7)
Eosinophils Relative: 3 % (ref 0–5)
Monocytes Absolute: 0.8 10*3/uL (ref 0.1–1.0)
Neutro Abs: 3.5 10*3/uL (ref 1.7–7.7)

## 2011-02-19 LAB — BASIC METABOLIC PANEL
CO2: 27 mEq/L (ref 19–32)
Calcium: 8.9 mg/dL (ref 8.4–10.5)
Chloride: 106 mEq/L (ref 96–112)
Creatinine, Ser: 0.9 mg/dL (ref 0.4–1.5)
GFR calc Af Amer: >60 mL/min (ref >60)
Glucose, Bld: 88 mg/dL (ref 70–99)

## 2011-02-19 LAB — CBC
Hemoglobin: 14 g/dL (ref 13.0–17.0)
MCHC: 33.2 g/dL (ref 30.0–36.0)
MCV: 91.1 fL (ref 78.0–100.0)
RBC: 4.64 MIL/uL (ref 4.22–5.81)
RDW: 15.5 % (ref 11.5–15.5)

## 2011-03-02 LAB — CBC
HCT: 38.1 % — ABNORMAL LOW (ref 39.0–52.0)
HCT: 41.8 % (ref 39.0–52.0)
Hemoglobin: 12.9 g/dL — ABNORMAL LOW (ref 13.0–17.0)
MCHC: 34 g/dL (ref 30.0–36.0)
MCHC: 34.1 g/dL (ref 30.0–36.0)
MCV: 90.1 fL (ref 78.0–100.0)
MCV: 90.2 fL (ref 78.0–100.0)
Platelets: 265 10*3/uL (ref 150–400)
Platelets: 320 10*3/uL (ref 150–400)
RDW: 14.8 % (ref 11.5–15.5)
RDW: 15.2 % (ref 11.5–15.5)

## 2011-03-02 LAB — URINALYSIS, ROUTINE W REFLEX MICROSCOPIC
Bilirubin Urine: NEGATIVE
Ketones, ur: NEGATIVE mg/dL
Nitrite: NEGATIVE
Urobilinogen, UA: 1 mg/dL (ref 0.0–1.0)
pH: 6.5 (ref 5.0–8.0)

## 2011-03-02 LAB — COMPREHENSIVE METABOLIC PANEL
ALT: 13 U/L (ref 0–53)
AST: 17 U/L (ref 0–37)
AST: 22 U/L (ref 0–37)
Albumin: 3.5 g/dL (ref 3.5–5.2)
BUN: 8 mg/dL (ref 6–23)
Calcium: 8.9 mg/dL (ref 8.4–10.5)
Creatinine, Ser: 0.99 mg/dL (ref 0.4–1.5)
GFR calc Af Amer: >60 mL/min (ref >60)
GFR calc non Af Amer: >60 mL/min (ref >60)
GFR calc non Af Amer: >60 mL/min (ref >60)
Total Bilirubin: 0.5 mg/dL (ref 0.3–1.2)

## 2011-03-02 LAB — DIFFERENTIAL
Basophils Absolute: 0.1 10*3/uL (ref 0.0–0.1)
Eosinophils Relative: 0 % (ref 0–5)
Lymphocytes Relative: 17 % (ref 12–46)
Lymphs Abs: 2 10*3/uL (ref 0.7–4.0)
Monocytes Absolute: 0.9 10*3/uL (ref 0.1–1.0)
Neutro Abs: 9 10*3/uL — ABNORMAL HIGH (ref 1.7–7.7)

## 2011-03-02 LAB — CULTURE, RESPIRATORY W GRAM STAIN

## 2011-03-02 LAB — RPR: RPR Ser Ql: NONREACTIVE

## 2011-03-02 LAB — APTT: aPTT: 40 seconds — ABNORMAL HIGH (ref 24–37)

## 2011-03-02 LAB — AFB CULTURE WITH SMEAR (NOT AT ARMC)

## 2011-03-02 LAB — FUNGUS CULTURE W SMEAR: Fungal Smear: NONE SEEN

## 2011-03-02 LAB — PROTIME-INR: Prothrombin Time: 13.9 seconds (ref 11.6–15.2)

## 2011-03-02 LAB — URINE MICROSCOPIC-ADD ON

## 2011-03-14 ENCOUNTER — Inpatient Hospital Stay (HOSPITAL_COMMUNITY)
Admission: EM | Admit: 2011-03-14 | Discharge: 2011-03-19 | DRG: 193 | Disposition: A | Discharge status: Home / Self Care | Payer: Medicare Other | Attending: Internal Medicine | Admitting: Internal Medicine

## 2011-03-14 ENCOUNTER — Emergency Department (HOSPITAL_COMMUNITY): Payer: Medicare Other

## 2011-03-14 DIAGNOSIS — E785 Hyperlipidemia, unspecified: Secondary | ICD-10-CM | POA: Diagnosis present

## 2011-03-14 DIAGNOSIS — Z9861 Coronary angioplasty status: Secondary | ICD-10-CM

## 2011-03-14 DIAGNOSIS — E2749 Other adrenocortical insufficiency: Secondary | ICD-10-CM | POA: Diagnosis present

## 2011-03-14 DIAGNOSIS — K5289 Other specified noninfective gastroenteritis and colitis: Secondary | ICD-10-CM | POA: Diagnosis present

## 2011-03-14 DIAGNOSIS — J4489 Other specified chronic obstructive pulmonary disease: Secondary | ICD-10-CM | POA: Diagnosis present

## 2011-03-14 DIAGNOSIS — Z86718 Personal history of other venous thrombosis and embolism: Secondary | ICD-10-CM

## 2011-03-14 DIAGNOSIS — J449 Chronic obstructive pulmonary disease, unspecified: Secondary | ICD-10-CM | POA: Diagnosis present

## 2011-03-14 DIAGNOSIS — E669 Obesity, unspecified: Secondary | ICD-10-CM | POA: Diagnosis present

## 2011-03-14 DIAGNOSIS — I959 Hypotension, unspecified: Secondary | ICD-10-CM | POA: Diagnosis present

## 2011-03-14 DIAGNOSIS — Z85118 Personal history of other malignant neoplasm of bronchus and lung: Secondary | ICD-10-CM

## 2011-03-14 DIAGNOSIS — Z7982 Long term (current) use of aspirin: Secondary | ICD-10-CM

## 2011-03-14 DIAGNOSIS — I5033 Acute on chronic diastolic (congestive) heart failure: Secondary | ICD-10-CM | POA: Diagnosis present

## 2011-03-14 DIAGNOSIS — I252 Old myocardial infarction: Secondary | ICD-10-CM

## 2011-03-14 DIAGNOSIS — I2510 Atherosclerotic heart disease of native coronary artery without angina pectoris: Secondary | ICD-10-CM | POA: Diagnosis present

## 2011-03-14 DIAGNOSIS — J189 Pneumonia, unspecified organism: Principal | ICD-10-CM | POA: Diagnosis present

## 2011-03-14 DIAGNOSIS — Z7902 Long term (current) use of antithrombotics/antiplatelets: Secondary | ICD-10-CM

## 2011-03-14 DIAGNOSIS — I509 Heart failure, unspecified: Secondary | ICD-10-CM | POA: Diagnosis present

## 2011-03-14 DIAGNOSIS — E876 Hypokalemia: Secondary | ICD-10-CM | POA: Diagnosis present

## 2011-03-14 DIAGNOSIS — F4321 Adjustment disorder with depressed mood: Secondary | ICD-10-CM | POA: Diagnosis present

## 2011-03-14 DIAGNOSIS — J9600 Acute respiratory failure, unspecified whether with hypoxia or hypercapnia: Secondary | ICD-10-CM | POA: Diagnosis present

## 2011-03-14 HISTORY — DX: Malignant (primary) neoplasm, unspecified: C80.1

## 2011-03-14 HISTORY — DX: Chronic obstructive pulmonary disease, unspecified: J44.9

## 2011-03-14 HISTORY — DX: Heart failure, unspecified: I50.9

## 2011-03-14 LAB — CBC
MCV: 85.8 fL (ref 78.0–100.0)
Platelets: 257 10*3/uL (ref 150–400)
RBC: 4.8 MIL/uL (ref 4.22–5.81)
WBC: 6.7 10*3/uL (ref 4.0–10.5)

## 2011-03-14 LAB — BASIC METABOLIC PANEL
BUN: 6 mg/dL (ref 6–23)
Chloride: 100 mEq/L (ref 96–112)
GFR calc Af Amer: >60 mL/min (ref >60)
GFR calc non Af Amer: >60 mL/min (ref >60)
Potassium: 3 mEq/L — ABNORMAL LOW (ref 3.5–5.1)
Sodium: 135 mEq/L (ref 135–145)

## 2011-03-14 LAB — DIFFERENTIAL
Basophils Absolute: 0 10*3/uL (ref 0.0–0.1)
Eosinophils Absolute: 0 10*3/uL (ref 0.0–0.7)
Lymphs Abs: 1.6 10*3/uL (ref 0.7–4.0)
Neutrophils Relative %: 66 % (ref 43–77)

## 2011-03-14 LAB — POCT CARDIAC MARKERS
CKMB, poc: <1 ng/mL — ABNORMAL LOW (ref 1.0–8.0)
Myoglobin, poc: 37.8 ng/mL (ref 12–200)
Troponin i, poc: <0.05 ng/mL (ref 0.00–0.09)

## 2011-03-14 LAB — DIGOXIN LEVEL: Digoxin Level: <0.2 ng/mL — ABNORMAL LOW (ref 0.8–2.0)

## 2011-03-15 ENCOUNTER — Inpatient Hospital Stay (HOSPITAL_COMMUNITY): Payer: Medicare Other

## 2011-03-15 LAB — BLOOD GAS, ARTERIAL
Acid-base deficit: 1.2 mmol/L (ref 0.0–2.0)
Drawn by: 308601
O2 Content: 4 L/min
O2 Saturation: 95.8 %
pO2, Arterial: 75.9 mmHg — ABNORMAL LOW (ref 80.0–100.0)

## 2011-03-15 LAB — LACTIC ACID, PLASMA: Lactic Acid, Venous: 2.1 mmol/L (ref 0.5–2.2)

## 2011-03-15 LAB — BASIC METABOLIC PANEL
BUN: 5 mg/dL — ABNORMAL LOW (ref 6–23)
CO2: 31 mEq/L (ref 19–32)
Calcium: 7.8 mg/dL — ABNORMAL LOW (ref 8.4–10.5)
Creatinine, Ser: 1.2 mg/dL (ref 0.4–1.5)
GFR calc non Af Amer: >60 mL/min (ref >60)
Glucose, Bld: 104 mg/dL — ABNORMAL HIGH (ref 70–99)

## 2011-03-16 ENCOUNTER — Inpatient Hospital Stay (HOSPITAL_COMMUNITY): Payer: Medicare Other

## 2011-03-16 DIAGNOSIS — J189 Pneumonia, unspecified organism: Secondary | ICD-10-CM

## 2011-03-16 LAB — CBC
HCT: 37.3 % — ABNORMAL LOW (ref 39.0–52.0)
Hemoglobin: 12.7 g/dL — ABNORMAL LOW (ref 13.0–17.0)
MCH: 29.3 pg (ref 26.0–34.0)
MCV: 86.1 fL (ref 78.0–100.0)
RBC: 4.33 MIL/uL (ref 4.22–5.81)
WBC: 7.2 10*3/uL (ref 4.0–10.5)

## 2011-03-16 LAB — DRUGS OF ABUSE SCREEN W/O ALC, ROUTINE URINE
Amphetamine Screen, Ur: NEGATIVE
Barbiturate Quant, Ur: NEGATIVE
Benzodiazepines.: NEGATIVE
Cocaine Metabolites: POSITIVE — AB
Methadone: NEGATIVE
Opiate Screen, Urine: NEGATIVE
Phencyclidine (PCP): NEGATIVE

## 2011-03-16 LAB — BASIC METABOLIC PANEL
BUN: 8 mg/dL (ref 6–23)
CO2: 24 mEq/L (ref 19–32)
Chloride: 101 mEq/L (ref 96–112)
Creatinine, Ser: 1.16 mg/dL (ref 0.4–1.5)
Glucose, Bld: 105 mg/dL — ABNORMAL HIGH (ref 70–99)
Potassium: 4.3 mEq/L (ref 3.5–5.1)

## 2011-03-16 LAB — LIPID PANEL
LDL Cholesterol: 71 mg/dL (ref 0–99)
Total CHOL/HDL Ratio: 4 RATIO
VLDL: 11 mg/dL (ref 0–40)

## 2011-03-16 LAB — LEGIONELLA ANTIGEN, URINE

## 2011-03-17 LAB — CBC
HCT: 37.9 % — ABNORMAL LOW (ref 39.0–52.0)
MCHC: 34 g/dL (ref 30.0–36.0)
MCV: 86.5 fL (ref 78.0–100.0)
Platelets: 202 10*3/uL (ref 150–400)
RDW: 14.6 % (ref 11.5–15.5)

## 2011-03-17 LAB — BASIC METABOLIC PANEL
BUN: 11 mg/dL (ref 6–23)
Calcium: 9.1 mg/dL (ref 8.4–10.5)
GFR calc Af Amer: >60 mL/min (ref >60)
GFR calc non Af Amer: >60 mL/min (ref >60)
GFR calc non Af Amer: >60 mL/min (ref >60)
Glucose, Bld: 92 mg/dL (ref 70–99)
Potassium: 4.1 mEq/L (ref 3.5–5.1)
Sodium: 133 mEq/L — ABNORMAL LOW (ref 135–145)

## 2011-03-17 LAB — CORTISOL: Cortisol, Plasma: 2 ug/dL

## 2011-03-17 LAB — T4, FREE: Free T4: 0.97 ng/dL (ref 0.80–1.80)

## 2011-03-17 LAB — TSH: TSH: 3.335 u[IU]/mL (ref 0.350–4.500)

## 2011-03-18 ENCOUNTER — Inpatient Hospital Stay (HOSPITAL_COMMUNITY): Payer: Medicare Other

## 2011-03-18 ENCOUNTER — Encounter (HOSPITAL_COMMUNITY): Payer: Self-pay | Admitting: Radiology

## 2011-03-18 LAB — CBC
HCT: 36.8 % — ABNORMAL LOW (ref 39.0–52.0)
Hemoglobin: 12.7 g/dL — ABNORMAL LOW (ref 13.0–17.0)
MCV: 86.4 fL (ref 78.0–100.0)
Platelets: 211 10*3/uL (ref 150–400)
RBC: 4.26 MIL/uL (ref 4.22–5.81)
WBC: 6.4 10*3/uL (ref 4.0–10.5)

## 2011-03-18 LAB — PHOSPHORUS: Phosphorus: 4.1 mg/dL (ref 2.3–4.6)

## 2011-03-18 LAB — DIFFERENTIAL
Eosinophils Absolute: 0.1 10*3/uL (ref 0.0–0.7)
Lymphocytes Relative: 20 % (ref 12–46)
Lymphs Abs: 1.3 10*3/uL (ref 0.7–4.0)
Neutro Abs: 4.5 10*3/uL (ref 1.7–7.7)
Neutrophils Relative %: 70 % (ref 43–77)

## 2011-03-18 LAB — COMPREHENSIVE METABOLIC PANEL
Albumin: 2.8 g/dL — ABNORMAL LOW (ref 3.5–5.2)
Alkaline Phosphatase: 59 U/L (ref 39–117)
BUN: 10 mg/dL (ref 6–23)
Chloride: 97 mEq/L (ref 96–112)
Potassium: 4.2 mEq/L (ref 3.5–5.1)
Total Bilirubin: 0.3 mg/dL (ref 0.3–1.2)

## 2011-03-18 LAB — MAGNESIUM: Magnesium: 2 mg/dL (ref 1.5–2.5)

## 2011-03-18 MED ORDER — IOHEXOL 300 MG/ML  SOLN
100.0000 mL | Freq: Once | INTRAMUSCULAR | Status: AC | PRN
  Administered 2011-03-18: 100 mL via INTRAVENOUS

## 2011-03-19 LAB — COMPREHENSIVE METABOLIC PANEL
ALT: 16 U/L (ref 0–53)
AST: 24 U/L (ref 0–37)
Alkaline Phosphatase: 66 U/L (ref 39–117)
CO2: 26 mEq/L (ref 19–32)
Chloride: 98 mEq/L (ref 96–112)
GFR calc Af Amer: >60 mL/min (ref >60)
GFR calc non Af Amer: >60 mL/min (ref >60)
Sodium: 134 mEq/L — ABNORMAL LOW (ref 135–145)
Total Bilirubin: 0.3 mg/dL (ref 0.3–1.2)

## 2011-03-19 LAB — DIFFERENTIAL
Basophils Absolute: 0 10*3/uL (ref 0.0–0.1)
Basophils Relative: 0 % (ref 0–1)
Lymphocytes Relative: 13 % (ref 12–46)
Monocytes Relative: 5 % (ref 3–12)
Neutro Abs: 9.2 10*3/uL — ABNORMAL HIGH (ref 1.7–7.7)
Neutrophils Relative %: 81 % — ABNORMAL HIGH (ref 43–77)

## 2011-03-19 LAB — OVA AND PARASITE EXAMINATION

## 2011-03-19 LAB — CBC
Hemoglobin: 11.8 g/dL — ABNORMAL LOW (ref 13.0–17.0)
RBC: 4.09 MIL/uL — ABNORMAL LOW (ref 4.22–5.81)

## 2011-03-21 LAB — CULTURE, BLOOD (ROUTINE X 2)
Culture  Setup Time: 201204301631
Culture  Setup Time: 201204301631

## 2011-03-22 LAB — STOOL CULTURE

## 2011-03-28 NOTE — Discharge Summary (Signed)
Ronald [name], Ronald [name]             ACCOUNT NO.:  [id number]  MEDICAL RECORD NO.:  [id number]           PATIENT TYPE:  I  LOCATION:  1423                         FACILITY:  [facility]  PHYSICIAN:  Rock Nephew, MD       DATE OF BIRTH:  [birth year]  DATE OF ADMISSION:  03/14/2011 DATE OF DISCHARGE:  03/19/2011                        DISCHARGE SUMMARY - REFERRING   ADDENDUM: This is addendum to discharge dictation summary dictated Mar 18, 2011, job number [id number].  DISCHARGE MEDICATIONS:  The patient's discharge diagnoses are as follows: 1. Acute respiratory failure, resolved. 2. Community-acquired pneumonia. 3. New diagnosis right-sided colitis. 4. Adrenal insufficiency. 5. Acute congestive heart failure, diastolic. 6. Polysubstance abuse. 7. Hyperlipidemia. 8. Hyperkalemia. 9. Coronary artery disease. 10.Tobacco abuse.  DISCHARGE MEDICATIONS:  Discharge medications for the patient are as follows: 1. Avelox 400 mg p.o. daily. 2. Colace 100 mg p.o. twice daily. 3. Combivent 2 puffs inhaled daily use daily and q.4 h p.r.n.     shortness of breath. 4. Guaifenesin 600 mg by mouth twice daily. 5. Hydrocortisone 20 mg in the morning, 10 mg at p.m. 6. Lasix 20 mg p.o. every other day. 7. Metronidazole 500 mg p.o. 3 times a day for 11 days. 8. Nicotine patch 14 mg for 24 hours transdermally daily. 9. Vicodin 5/500 one tablet by mouth every 6 hours as needed for pain. 10.Aspirin 325 mg p.o. daily. 11.Digoxin 0.125 mg p.o. daily. 12.Lipitor 20 mg p.o. daily. 13.Plavix 75 mg p.o. daily.  DISPOSITION:  The patient is discharged home.  ADDENDUM:  This addendum is dictated to the hospital course from Mar 18, 2011 to Mar 19, 2011.  The patient was noted to be complaining of abdominal pain.  The patient had a CT scan of the abdomen and pelvis done: 1. The CT scan of the abdomen and pelvis showed right-sided colitis     likely due to pseudomembranes or other infectious colitis.  No  evidence of abscess or complication. 2. No evidence of abdominal pelvic metastatic disease. 3. Right hilar lymphadenopathy is incompletely visualized on the     study.  Consider CT of the chest with additional IV contrast after 24 to 48 hours for further evaluation.  The patient had a C diff PCR drawn.  The C diff PCR was negative.  The patient was started on Flagyl.  The patient's Avelox will be extended for additional 10 days and the patient will get 11 days of metronidazole for colitis.  The patient should also follow up with Dr. Carman Ching, the patient's primary gastroenterologist, in about 1 month.  Also of note we reviewed the records of the patient's colonoscopy done by Dr. Randa Evens and EGD was done in December 2011.  The colonoscopy showed one 5 mm polyp in the descending colon resected and retrieved, medium sized internal hemorrhoids.  Otherwise it was unremarkable.  The patient's polyp was tubular adenoma and no  high-grade dysplasia or malignancy was identified.  The patient was also instructed to use a high fiber diet and drink plenty of fluids.  Also of note the patient's 2-D echocardiogram showed following findings: Left ventricular cavity size is normal,  ejection fraction 60 to 65%. Wall motion was normal.  Mild aortic regurg.  Moderate to severe mitral regurg.  Left atrium was dilated.  Pulmonary artery peak pressure was 54 mmHg.  As far as followup, the patient is noted to follow up with the following physicians:  The patient should follow up with Dr. Jeri Cos within 1 week.  The patient should obtain a referral to see an endocrinologist in 1 to 2 weeks.  The patient should follow up with Dr. Sharyn Lull in about 4 weeks.  The patient should follow up with Dr. Si Gaul in 2 months.  The patient should also follow up with Dr. Carman Ching in 1 month to follow up for the colitis.     Rock Nephew, MD     NH/MEDQ  D:  03/19/2011  T:  03/19/2011   Job:  [id number]  Electronically Signed by Rock Nephew MD on 03/28/2011 01:57:03 PM

## 2011-03-28 NOTE — Discharge Summary (Signed)
Ronald [name], Ronald [name]             ACCOUNT NO.:  [id number]  MEDICAL RECORD NO.:  [id number]           PATIENT TYPE:  I  LOCATION:  1423                         FACILITY:  [facility]  PHYSICIAN:  Rock Nephew, MD       DATE OF BIRTH:  07-20-1956  DATE OF ADMISSION:  03/14/2011 DATE OF DISCHARGE:  03/18/2011                        DISCHARGE SUMMARY - REFERRING   PRIMARY CARE PHYSICIAN:  Jarome Matin, M.D.  PRIMARY CARDIOLOGIST:  Eduardo Osier. Sharyn Lull, M.D.  PRIMARY ONCOLOGIST:  Lajuana Matte, M.D.  DISCHARGE DIAGNOSES: 1. Acute respiratory failure, resolved. 2. Community-acquired pneumonia. 3. Possible acute congestive heart failure, not otherwise specified, 2-     D echocardiogram pending. 4. Polysubstance abuse, counseled. 5. Hyperlipidemia. 6. Hyperkalemia, resolved. 7. Hypotension and adrenal insufficiency, started on oral     hydrocortisone. 8. Coronary artery disease. 9. Tobacco abuse. 10.Abdominal pain.  CT scan of the abdomen and pelvis is pending.  DISCHARGE MEDICATIONS:  Discharge medications for the patient are as follows: 1. Avelox 400 mg p.o. daily for 5 days. 2. Combivent two puffs inhaled daily.  Use daily and q.4 hours p.r.n.     shortness of breath. 3. Guaifenesin 600 mg by mouth twice daily. 4. Hydrocortisone 20 mg in the morning and 10 mg in the p.m. 5. Nicotine patch 14 mg per 24 hours. 6. Aspirin enteric-coated 325 mg p.o. daily. 7. Digoxin 0.125 mg p.o. daily. 8. Lipitor 20 mg p.o. daily. 9. Plavix 75 mg p.o. daily.  DISPOSITION:  The patient is discharged home.  DIET:  The patient's diet is heart-healthy.  PROCEDURES PERFORMED:  The patient had a two-view chest x-ray performed March 14, 2011, which showed airspace prominence of right hilar region and infrahilar lung suspicious for acute pneumonia and bronchitis.  Last chest x-ray on Mar 16, 2011, showed decreased pulmonary vascular congestion in the right lung.  Otherwise stable appearance of  the medial right lung base.  Stable sequelae of left pneumonectomy.  The patient has a CT scan of the abdomen and pelvis that is pending and the patient has a 2-D echocardiogram that is still pending.  CONSULTATIONS ON THIS CASE:  Pulmonary Critical Care Service Dr. Sandrea Hughs.  FOLLOWUP:  The patient should follow up with the following physicians. 1. The patient should follow up with Dr. Jeri Cos in 1 week. 2. The patient should follow up with Dr. Sharyn Lull in 4 weeks. 3. The patient should follow up with Dr. Velora Heckler. Mohamed in about 2     months. 4. The patient should also obtain a referral to see an endocrinologist     in the next 1 to 2 weeks. 5. The patient should also be compliant with his medications.  I have     suspicion that the patient is noncompliant with his medications.  BRIEF HISTORY OF PRESENT ILLNESS:  This is a 55 year old male who presents for shortness of breath which has been ongoing for the last 2 days.  He admits to cough which is causing left-sided chest pain.  He is coughing up a whitish, clear-colored mucous.  He does not complain of a sore throat.  He  also has been having a fever of 101 degrees.  His chest x-ray showed pneumonia.  HOSPITAL COURSE: 1. Acute respiratory failure.  The patient was initially admitted to     the step-down unit.  The patient received antibiotics as well as     nebulizations.  The patient had blood cultures.  Urine     streptococcus antigen was positive.  As the urine streptococcus     antigen was positive, the patient had azithromycin added. 2. Community-acquired pneumonia.  Again, the patient had community-     acquired pneumonia.  Urine streptococcus antigen was positive.  The     patient was treated with Avelox and azithromycin for about 5 days.     The patient will be treated now with Avelox for an additional 5     days to complete a 10-day course. 3. Acute CHF, not otherwise specified.  The patient's BNP was  slightly     elevated at 260.  The patient has a 2-D echocardiogram that is     still pending. 4. Polysubstance abuse.  The patient has a history of cocaine abuse.     The patient was counseled. 5. Hyperlipidemia.  The patient was continued on his statin     medication. 6. Hyperkalemia.  The patient was noted to have hyperkalemia of 5.6 on     Mar 17, 2011.  The patient lactulose and Kayexalate and the     patient's hyperkalemia resolved. 7. Hypotension and adrenal insufficiency.  The patient was noted to be     slightly hypotensive.  We checked a random cortisol level on the     patient.  The patient's cortisol level came back at 2.0, which is     low.  The patient was started on hydrocortisone.  The patient     should follow up with the endocrinologist for possible adrenal     insufficiency. 8. Coronary artery disease.  The patient received Plavix during the     hospitalization.  The patient takes aspirin and Plavix at home.  He     should be continued on that and follow up with Dr. Sharyn Lull. 9. Tobacco abuse.  The patient was counseled.  He received nicotine     patches during the hospitalization and he will get nicotine patches    on discharge. 10.Abdominal pain.  On Mar 18, 2011, the patient reported some 5 out of     10 right middle quadrant abdominal pain.  The patient has had     normal LFTs.  We are going to get a CT scan of the abdomen and     pelvis, which is still pending right now.  The patient was recently     constipated and has recently moved his bowels.  The     abdominal pain could be related to constipation. 11.Also we will check the patient's pulse oximetry on room air before     discharge to see if he qualifies for home oxygen.     Rock Nephew, MD     NH/MEDQ  D:  03/18/2011  T:  03/18/2011  Job:  [id number]  cc:   Jarome Matin, M.D. Fax: [phone]  Pulmonary Critical Care Medicine  Eduardo Osier. Sharyn Lull, M.D. Fax: [phone]  Lajuana Matte, M.D. Fax:  [phone]  Electronically Signed by Rock Nephew MD on 03/28/2011 01:56:48 PM

## 2011-03-30 NOTE — Discharge Summary (Signed)
NAMEBILLAL, Ronald [name]             ACCOUNT NO.:  [id number]   MEDICAL RECORD NO.:  [id number]          PATIENT TYPE:  INP   LOCATION:  2007                         FACILITY:  [facility]   PHYSICIAN:  Ronald [name], M.D. DATE OF BIRTH:  1956/04/09   DATE OF ADMISSION:  05/16/2008  DATE OF DISCHARGE:  05/23/2008                               DISCHARGE SUMMARY   ADMITTING DIAGNOSES:  Recent anteroseptal wall myocardial infarction,  post infarction angina, history of carcinoma of lung, status post left  pneumonectomy, history of tobacco abuse, hypercholesteremia, positive  family history of coronary artery disease.   DISCHARGE DIAGNOSES:  Status post recent anterolateral wall myocardial  infarction, status post PTCA stenting to LAD, status post left  pneumonia, status post left shoulder bursitis, hypercholesteremia,  history of carcinoma of left lung status post left lung resection,  history of tobacco abuse, positive family history of coronary artery  disease.   DISCHARGE HOME MEDICATIONS:  1. Enteric-coated aspirin 325 mg 1 tablet daily.  2. Plavix 75 mg 1 tablet daily with food.  3. Coreg 3.125 mg 1 tablet every 12 hours.  4. Ramipril 2.5 mg 1 capsule daily.  5. Lipitor 40 mg 1 tablet daily.  6. Prilosec 20 mg 1 capsule daily.  7. Singulair 10 mg 1 tablet daily.  8. Avelox 400 mg 1 tablet daily for 5 more days.  9. Nitrostat 0.4 mg sublingual use as directed.   DIET:  Low salt, low cholesterol.   ACTIVITY:  Increase activity slowly.  The patient will be scheduled for  phase II cardiac rehab as outpatient post PTCA stent.  Instructions have  been given.  Follow up with me in 1 week.   CONDITION ON DISCHARGE:  Stable.   BRIEF HISTORY AND HOSPITAL COURSE:  Ronald [name] is a 55 year old black  male with past medical significant for CA of the lung status post left  lung resection, hypercholesteremia, history of tobacco abuse.  He came  to the ER complaining of recurrent  retrosternal chest pain radiating to  both arms grade 10/10, associated with mild shortness of breath and  diaphoresis.  The patient states he was seen at [facility] and [facility] earlier this week with similar chest pain, but as pain got  worse, so decided to come to ER.  EKG done in the ER showed normal sinus  rhythm with anteroseptal wall MI, age determined and T-wave inversion in  high lateral leads and was noted to have CPK-MB of more than 80 and  troponin I of 5.95.   PAST MEDICAL HISTORY:  As above.   PAST SURGICAL HISTORY:  He had left lung resection approximately 5 plus  years ago.   MEDICATION AT HOME:  1. He takes Valium 5 mg h.s.  2. Tylenol with Codeine as needed.  3. Lipitor 10 mg p.o. daily.  4. Aspirin 81 mg p.o. daily.  5. Singular 10 mg daily.  6. Prilosec 20 mg p.o. daily.   SOCIAL HISTORY:  He is single, 3 children.  Smoked 1 pack per day for 25  years, quit 5 years ago.  Used to drink 15 years ago, the patient on  disability.   FAMILY HISTORY:  Positive for coronary artery disease.   PHYSICAL EXAMINATION:  GENERAL:  On examination, he is alert, awake, and  oriented x3 in no acute distress.  VITAL SIGNS:  Blood pressure was 130/81, pulse was 81 regular.  HEENT:  Conjunctivae was pink and supple, no JVD.  LUNGS:  Right lung clear to auscultation without rhonchi or rales.  CARDIOVASCULAR EXAM:  S1, S2 was normal.  There was soft systolic murmur  and S4 gallop.  ABDOMEN:  Soft.  Bowel sounds were present, nontender.  EXTREMITIES:  There is no clubbing, cyanosis, or edema.   LABORATORY DATA:  His admission labs were point of care CPK-MB was more  than 80, troponin I went up to 7.67.  Repeat CPK-MB, CK was 819, MB 106,  relative index 13, and troponin I was 8.45.  On July 3, CPK was 706, MB  60.4, relative index 8.6, troponin I was 6.47 which was trending down.  On July 4, CPK was 345, MB 13.9, relative index 4.0, troponin I was  3.58.  On July 5,  CK was 182, MB 4.6, troponin I was 3.04.  On July 8,  CK is 97, MB 3.3, troponin I was 1.09.  His cholesterol was 122, LDL 80,  HDL of 34.  His hemoglobin on July 8, is 12.1, hematocrit 36.2, white  count of 9.0.  Sodium was 140, potassium 3.9, glucose 101, BUN 6,  creatinine 0.87.  Sputum cultures grew moderate gram-positive rods and  few gram-positive cocci.  Blood cultures were negative.   BRIEF HOSPITAL COURSE:  The patient was directly taken to the cath lab  and underwent left cardiac cath with selective left and right coronary  angiography and PTCA stenting to proximal LAD.  As per procedure report,  the patient tolerated procedure well.  There were no complications.  The  patient was transferred to recovery room in stable condition.  Postprocedure, the patient developed high-grade fever, was started  empirically on Avelox and subsequently vanco was added.  Pancultures  were obtained.  Chest x-ray was also obtained which suggested right  basilar increase in atelectasis, persistent interstitial edema at the  right lung base.  The patient also had recurrent chest pain during the  hospital stay.  Repeat EKG showed high lateral injury pattern.  After  reviewing the angiogram, it was felt the diagonal vessel was very small  and was not suitable for PCI.  The patient was empirically treated with  beta-blockers, nitrates.  The patient's troponin I and CPK-MB gradually  came down towards normal.  The patient has been ambulating in the  hallway without any problems.  Phase I cardiac rehab was called and the  patient will be scheduled for phase II cardiac rehab as outpatient.  The  patient also has appointment to see Dr. [name] for his followup for his  CA.  The patient has been afebrile for last few days and is eager to go  home and will be discharged home on above medications.  If he continues  to have recurrent chest pain, we will consider PCI to ramus branch as  outpatient.     Ronald [name], M.D.  Electronically Signed    MNH/MEDQ  D:  05/23/2008  T:  05/24/2008  Job:  [id number]

## 2011-03-30 NOTE — Op Note (Signed)
NAMENAVIN, DOGAN             ACCOUNT NO.:  [id number]   MEDICAL RECORD NO.:  [id number]          PATIENT TYPE:  AMB   LOCATION:  SDS                          FACILITY:  [facility]   PHYSICIAN:  Ines Bloomer, M.D. DATE OF BIRTH:  1956-11-09   DATE OF PROCEDURE:  DATE OF DISCHARGE:  12/27/2008                               OPERATIVE REPORT   PREOPERATIVE DIAGNOSIS:  Status post left pneumonectomy for Stage IIIA  non-small cell lung cancer, right hilar adenopathy.   POSTOPERATIVE DIAGNOSIS:  Status post left pneumonectomy for Stage IIIA  non-small cell lung cancer, right hilar adenopathy.   OPERATION PERFORMED:  Video bronchoscopy with EBUS.   SURGEON:  Ines Bloomer, M.D.   ANESTHESIA:  General anesthesia.   PROCEDURE IN DETAIL:  After general anesthesia, the video bronchoscope  was passed through the endotracheal tube.  The carina was in the  midline.  The left main stem bronchus showed a well-healed bronchial  stump.  The right upper lobe, right middle lobe, and right lower lobe  orifices were normal.  We then inserted the EBUS scope that confirmed  nodal tissue around the right middle lobe take-off bronchus and around  the right upper lobe take-off bronchus.  The EBUS scope was advanced  down to the right middle lobe just to the take-off of the bronchus  intermedius and we put this on the upper wall and could see a large  amount of nodal tissue approximately 2 x 3 cm in size.  This was  continuous with the lung.  Then, under ultrasound guidance, we inserted  and did two passes into this with the EBUS needle and sent that for  cytology and we advanced scope back to the superior portion of bronchus  intermedius, we could see the right upper lobe take-off, nodal tissue  right at the subcarinal area.  Again, under guidance, we did 2 to 3 more  passes there and sent that for cytology.  The area was irrigated  copiously.  The patient tolerated the procedure well, returned  to  recovery room in stable condition.      Ines Bloomer, M.D.  Electronically Signed     DPB/MEDQ  D:  12/27/2008  T:  12/28/2008  Job:  [id number]   cc:   Leslye Peer, MD

## 2011-03-30 NOTE — Op Note (Signed)
NAMEWALTON, DIGILIO             ACCOUNT NO.:  [id number]   MEDICAL RECORD NO.:  [id number]          PATIENT TYPE:  INP   LOCATION:  2911                         FACILITY:  [facility]   PHYSICIAN:  Mohan N. Sharyn Lull, M.D. DATE OF BIRTH:  30-Jul-1956   DATE OF PROCEDURE:  05/16/2008  DATE OF DISCHARGE:                               OPERATIVE REPORT   PROCEDURES:  1. Left cardiac cath with selective right and left coronary      angiography, left ventriculography via right groin using Judkins      technique.  2. Successful percutaneous transluminal coronary angioplasty to      proximal and proximal and mid junction of left anterior descending      using 3.0-mm x 12-mm long Voyager balloon.  3. Successful deployment of 3.5-mm x 33-mm long Cypher drug-eluting      stent in proximal and proximal and mid junction of left anterior      descending.  4. Successful post dilatation of the stent using 3.75-mm x 20-mm long      Platter Voyager balloon going up to 18 atmospheric pressure.   INDICATIONS FOR PROCEDURE:  Mr. Menter is 55 year old black male with  past medical history significant for CA of lung, status post left lung  resection, hypercholesterolemia, history of tobacco abuse, history of  cocaine abuse and marijuana abuse in the past.  Last cocaine use was  more than 5 years ago.  He came to the ER complaining of recurrent  retrosternal chest pain radiating to the both arms, grade 10/10  associating with mild shortness of breath and diaphoresis.  The patient  states was seen at [facility] and [facility] earlier  with similar chest pain, but has been got worse, so decided to come to  ER.  EKG done in the ER showed normal sinus rhythm with Q-wave in  anteroseptal leads and T-wave inversion and high lateral leads and was  noted to have CPK-MB of more than 80 and troponin I of 5.95.  The  patient received aspirin, Plavix, sublingual nitroglycerin, and heparin  on the way to  cath lab with partial relief of chest pain from 10/10 to  7/10.  Due to chest pain, positive cardiac markers and EKG suggestive of  anteroseptal wall MI and post infarction, angina.  Discussed with the  patient regarding emergency left cath, possible PTCA stenting, its risks  and benefits, i.e., death, mild stroke, need for emergency CABG, risk of  restenosis, local vascular complications, etc., and consented for the  procedure.   PROCEDURE:  After obtaining the informed consent, the patient was  brought to the cath lab and was placed on fluoroscopy table.  The right  groin was prepped and draped in usual fashion.  A 1% Xylocaine was used  for local anesthesia in the right groin.  With the help of thin-walled  needle, a 6-French arterial sheath was placed.  The sheath was aspirated  and flushed.  Next, 6-French left Judkins catheter was advanced over the  wire under fluoroscopic guidance up to the ascending aorta.  Wire was  pulled out,  the catheter was aspirated and connected to the manifold.  Catheter was further advanced and engaged into left coronary ostium.  Multiple views of the left system were taken.  Next, the catheter was  disengaged and was pulled out over the wire and was replaced with 6-  [place] right Judkins catheter, which was advanced over the wire under  fluoroscopic guidance up to the ascending aorta.  Wire was pulled out,  the catheter was aspirated and connected to the manifold.  Catheter was  further advanced and engaged into right coronary ostium.  Multiple views  of the right system were taken.  Next, the catheter was disengaged and  was pulled out over the wire and was replaced with 6-French pigtail  catheter, which was advanced over the wire under fluoroscopic guidance  up to the ascending aorta.  Wire was pulled out, the catheter was  aspirated and connected to the manifold.  Catheter was further advanced  across the aortic valve into the LV.  LV pressures were  recorded.  Next,  LV graft was done in 30-degree RAO position.  Post angiographic  pressures were recorded from LV and then pullback pressures were  recorded from the aorta.  There was no gradient across the aortic valve.  Next, a pigtail catheter was pulled out over the wire.  Sheaths were  aspirated and flushed.   FINDINGS:  LV showed anterolateral wall severe hypokinesia, EF of  approximately 40-45%.  There was 2+ MR.  Left main was patent.  LAD has  70% long proximal stenosis with haziness, which appears to be the  culprit lesion for the chest pain.  Also, diagonal I was very, very  small, which was 100% occluded proximally, which was felt not suitable  for PCI.  Diagonal II and III were very, very small, which were patent.  Ramus has 60-70% proximal smooth stenosis.  Left circumflex was small,  which was patent.  RCA has 30-40% proximal diffuse stenosis and 50-60%  mid stenosis.  PDA and PLV branches were patent.   INTERVENTIONAL PROCEDURE:  Successful PTCA to proximal and proximal and  mid junction of LAD was done using 3.0-mm x 12-mm long Voyager balloon  for predilatation and then 3.5-mm x 33-mm long Cypher drug-eluting stent  was deployed in proximal and proximal and mid junction of LAD at 15  atmospheric pressure.  Stent was post dilated using 3.75-mm x 20-mm long  [place] Voyager balloon going up to 18 atmospheric pressure.  Lesion was  dilated from 70% with haziness to 0% as well with excellent TIMI grade  III distal flow without evidence of dissection or distal embolization.  The patient received weight-based heparin integral and 600 mg total of  Plavix during the procedure.  The patient tolerated the procedure well.  There were no complications.  The patient was transferred to the  recovery room in stable condition.      Eduardo Osier. Sharyn Lull, M.D.  Electronically Signed     MNH/MEDQ  D:  05/16/2008  T:  05/17/2008  Job:  [id number]   cc:   Jarome Matin, M.D.

## 2011-03-30 NOTE — Letter (Signed)
December 25, 2008   Lajuana Matte, MD  [address] [place], [place] [address]   Re:  Ronald [name], Ronald [name]             DOB:  1956-05-14   Dear [name],   I appreciate the opportunity of seeing the patient.  This 55 year old  African American male with diagnosis of stage IIIA cancer in July 2004  and underwent neoadjuvant radiation and chemotherapy and followed by  left pneumonectomy performed by Dr. Berneda Rose in [place].  He has been following in her clinic here and has had some right hilar  adenopathy that is slightly increased in size, increasing despite a  negative PET scan.  He is referred here for evaluation.  He has had no  hemoptysis, fever, chills, or excessive sputum.   MEDICATIONS:  Percocet and hydrocodone.   ALLERGIES:  He has no allergies.   FAMILY HISTORY:  Positive for vascular disease.   SOCIAL HISTORY:  He is single and has 3 children.  Still occasionally  smokes and does not drink alcohol on a regular basis.   REVIEW OF SYSTEMS:  Vital Signs:  He is 5 feet and 232 pounds.  General:  His weight has been stable.  Cardiac:  No angina or atrial fibrillation.  Pulmonary:  See history of present illness.  GI:  No nausea, vomiting,  constipation, or diarrhea.  GU:  No kidney disease, dysuria, or frequent  urination.  Vascular:  No claudication, DVT, or TIAs.  Neurological:  No  dizziness, headaches, blackouts, or seizures.  Musculoskeletal:  No  arthritis.  Psychiatric:  No psychiatric illness.  Eyes/ENT:  No changes  in eyesight or hearing.  Hematological:  No problems with bleeding or  clotting disorders.   PHYSICAL EXAMINATION:  VITAL SIGNS:  His blood pressure is 117/76, pulse  92, respirations 20, and sats are 90%.  His pulmonary function tests  showed an FVC of 1.82 with an FEV-1 of 1.38.  HEAD, EYES, EARS, NOSE AND THROAT:  Unremarkable.  NECK:  Supple without thyromegaly.  There is no supraclavicular or  axillary adenopathy.  CHEST:   Regular sinus rhythm.  No murmurs.  There is some depression of  the PMI to the left.  No breath sounds on the left and normal breath  sounds on the right.  ABDOMEN:  Soft.  There is no hepatosplenomegaly.  EXTREMITIES:  Pulses are 2+.  There is no clubbing or edema.   I have discussed the options of using EBUS for possible diagnosis and he  agrees for the procedure and we will plan to do on December 27, 2008 at  [facility].  I will let you know our results.  I think, this is the  only way that we can possibly get a diagnosis of possible recurrence.  I  appreciate the opportunity of seeing the patient.   Sincerely.   Ines Bloomer, M.D.  Electronically Signed   DPB/MEDQ  D:  12/25/2008  T:  12/25/2008  Job:  [id number]

## 2011-03-30 NOTE — Letter (Signed)
December 31, 2008   Lajuana Matte, MD  [phone] [address]  [place], [place] [address]   Re:  Ronald [name], Ronald [name]             DOB:  12/16/1955   Dear [name]:   I saw the patient back today after we did an EBUS for his 10R node. Cell  block and specimens just showed some bronchial epithelial cells as well  as some blood cells, but I did not see any malignant cells.  I think we  got a fairly good aspiration of this, but it would have been nice if  there had been some more lymphocytes.  His washings were all negative.  Considering that this is the only thing that we can do to make a  diagnosis, I think I would continue to follow him and should his  lymphadenopathy progress, we can do an EBUS again in the future.  His  blood pressure was 116/74, pulse 100, respirations 18, and sats were  94%.  I will see him again as needed.   Sincerely,   Ines Bloomer, M.D.  Electronically Signed   DPB/MEDQ  D:  12/31/2008  T:  12/31/2008  Job:  [id number]   cc:   Leslye Peer, MD

## 2011-04-02 NOTE — Consult Note (Signed)
Ronald [name], Ronald [name]             ACCOUNT NO.:  [id number]   MEDICAL RECORD NO.:  [id number]          PATIENT TYPE:  INP   LOCATION:  5705                         FACILITY:  [facility]   PHYSICIAN:  Shirley Friar, MDDATE OF BIRTH:  1956-09-14   DATE OF CONSULTATION:  09/06/2005  DATE OF DISCHARGE:                                   CONSULTATION   REASON FOR CONSULTATION:  Abdominal pain, constipation.   HISTORY OF PRESENT ILLNESS:  55 year old black male with a history of lung  cancer status post radiation and chemotherapy approximately two years ago  who was admitted on September 04, 2005, secondary to abdominal pain and  constipation.  He describes the abdominal pain as being postprandial  occurring within several minutes of eating with associated nausea.  He is  also having constipation and unable to pass his bowels over the past 7-10  days.  He says the abdominal pain has been intermittent over the past ten  days, is worse after eating, but does occasionally wake him up from sleep.  He was seen by his medicine doctor last week, underwent a barium enema  secondary to his constipation, which a large amount of retained feces,  especially on the right side of the colon and prevented complete evaluation  of this area.  Otherwise, no abnormality was noted on barium enema.  The  patient reports having a weight loss of 2-3 pounds over the past week.  Of  note, the patient acknowledges that he has used cocaine recently and his  urine drug screen was positive for such.  He also has been on opiates for  pain control over the past 7-10 days.  He denies any hematemesis,  hematochezia, melena.  He denies any family history of colorectal cancer or  stomach cancer.  He also denies any NSAID use.   He was seen in the emergency room  prior to this admission where he had a  negative workup including negative lipase, amylase, and negative CAT scan.  This CAT scan was repeated during this admission  and, again, he had no acute  abdominal process and no evidence of metastatic disease.  Currently, the  patient states that his abdominal pain shows up every time he eats and he  denies ever having these symptoms prior to ten days ago.   PAST MEDICAL HISTORY:  1.  History of lung cancer.  2.  Status post left pneumonectomy (status post chemotherapy and radiation      per medical chart).   MEDICATIONS ON ADMISSION:  Oxycodone 5 mg q.4-6h. p.r.n.   ALLERGIES:  Penicillin causes pruritus.   PHYSICAL EXAMINATION:  VITAL SIGNS:  Temperature 98.8, pulse 68, blood pressure 121/77, O2  saturations 99% on room air, weight 160 pounds.  GENERAL:  Disheveled, no acute distress, alert.  HEENT:  Nonicteric sclerae, oropharynx clear.  CHEST:  Clear to auscultation bilaterally.  CARDIOVASCULAR:  Regular rate and rhythm.  ABDOMEN:  Tender in midline (suprapubic area), otherwise, nontender,  positive guarding to deep palpation, otherwise, no rebound, soft,  nondistended, active bowel sounds, liver edge not palpable, no masses  palpated.  EXTREMITIES:  No edema.   LABORATORY DATA:  White blood cell count 6.1, hemoglobin 15.1, hematocrit  44.8, platelet count 337.  LFTs with T-bili 0.7, ALP 75, AST 20, ALT 14,  total protein 8.2, amylase 84, lipase 17.  Urine drug screen positive for  cocaine and opiates.   ASSESSMENT:  55 year old black male with a history of lung cancer presents  with ten days of abdominal pain and chronic constipation with risk factors  with differential diagnosis including malignancy versus ischemia versus  strictures.  The patient's recent cocaine use increases the likelihood of  possible ischemia, although he is not having any evidence of hematochezia.  He needs an endoscopic evaluation to evaluate for possible malignancy as  well as ischemia.   PLAN:  1.  Will prep patient for colonoscopy to evaluate for colon cancer and/or      ischemia.  2.  Will do upper endoscopy, as  well, to rule out peptic ulcer disease,      although I suspect that his symptoms are secondary to lower GI source.  3.  If EGD and colonoscopy are negative, the patient will most likely need a      small bowel series to further evaluate his GI tract.  4.  If small bowel series negative, then the patient may need further workup      for possible ischemia with his cocaine history, although at this time, I      recommend the steps as outlined above.  5.  Will plan to do EGD and colonoscopy on September 07, 2005.  6.  Recommend stool guaiac.      Shirley Friar, MD  Electronically Signed     VCS/MEDQ  D:  09/06/2005  T:  09/06/2005  Job:  [id number]

## 2011-04-02 NOTE — H&P (Signed)
Ronald [name], [place]             ACCOUNT NO.:  [id number]   MEDICAL RECORD NO.:  [id number]          PATIENT TYPE:  INP   LOCATION:  5705                         FACILITY:  [facility]   PHYSICIAN:  Fleet Contras, M.D.    DATE OF BIRTH:  February 27, 1956   DATE OF ADMISSION:  09/04/2005  DATE OF DISCHARGE:                                HISTORY & PHYSICAL   PRESENTING COMPLAINT:  Abdominal pain.   HISTORY OF PRESENT ILLNESS:  Mr. Mozley is a 55 year old African-American  gentleman with past medical history significant for lung cancer. He  presented to the emergency room twice this week with complaints of  progressively worsening abdominal pain in the mid abdomen, non radiating,  associated with nausea, vomiting and diarrhea x1 on the day of presentation.  He denies any fever, chills, he had no alcohol intake and denies using any  illicit drugs. Denies using any NSAIDs such as Aleve, Motrin or Advil.  During his previous visit to the emergency room he had work up including  blood work. Lipase, amylase, CT scan of the abdomen which were all negative  and he was discharged home on Oxycodone. He says this did not help his pain  and he decided to return. At this time again evaluation in the emergency  room did not reveal any significant findings on the laboratory data. However  because of his persistent pain he is being hospitalized for further  evaluation and appropriate treatment.   PAST MEDICAL HISTORY:  1.  History of left sided lung cancer for which he had a lobectomy,      chemotherapy and radiation at [facility] about 2 years ago.  2.  He denies any history of heart disease, diabetes. He has quit smoking      tobacco for a year.   PAST SURGICAL HISTORY:  As above, left lobectomy of the lung.   MEDICATIONS:  Oxycodone 5 mg one p.o. q.4-6h p.r.n.   ALLERGIES:  PENICILLIN (causes pruritus).   FAMILY AND SOCIAL HISTORY:  He currently lives with his fiance. He denies  any use of alcohol  or illicit drugs recently. He said he quit tobacco use  about a year ago.   REVIEW OF SYSTEMS:  HEENT:  He denies any headaches, dizziness, blurring of  vision or weakness of extremities.  CVS:  No chest pain, shortness of breath, orthopnea or paroxysmal nocturnal  dyspnea.  GI:  As above. GU:  He denies any dysuria, frequency or hematuria.  MUSCULOSKELETAL:  He denies any joint pains, joint swelling, or muscle  pains.   PHYSICAL EXAMINATION:  GENERAL:  He is lying supine in bed. Not in acute  respiratory distress. He is not pale, not icteric. He is not dehydrated.  VITAL SIGNS:  Blood pressure 119/69, heart rate 60 and regular, respiratory  rate of 18.  Temperature 98.7. His O2 saturations on room air are 97%.  NECK:  Supple with no JVD, no cervical lymphadenopathy.  CHEST:  Shows good air entry bilaterally with occasional rhonchi.  CARDIAC:  Heart sounds 1 and 2 are heard with no murmur.  ABDOMEN:  Soft and  nontender without guarding or rebound. He had no palpable  masses. Bowel sounds are present.  EXTREMITIES:  Show no edema, no calf tenderness or swelling.   LABORATORY DATA:  White count 7500, hemoglobin 15.9, hematocrit 47.2,  platelet count 342,000. Sodium is 131, potassium 4.1, chloride 95,  bicarbonate of 30, BUN 8, creatinine 0.9 and glucose of 101. Amylase 84,  lipase was 17. Urinalysis essentially normal. CT scan of the abdomen at the  pelvis performed on August 24, 2005 was negative. He also had a barium  enema performed as an outpatient on August 31, 2005 and again this returned  normal except for some feces in the right colon.   ASSESSMENT:  A 55 year old African-American gentleman with past medical  history significant for left-sided lung cancer, presenting with unrelenting  abdominal pain difficult to control in the outpatient setting. Unfortunately  no significant finding on laboratory or radiological work up. He is being  admitted for pain control and further  intervention.   ADMISSION DIAGNOSES:  1.  Abdominal pain.  2.  History of lung cancer.   PLAN:  He will be admitted to a medical bed and placed on a clear liquid  diet. Intravenous fluids with IV Morphine 2-4 mg q.4h p.r.n. A repeat CT  scan of the abdomen will be performed. He will also be placed on Protonix 40  mg IV daily. He may require a colonoscopy to evaluate all of the colon. This  plan of care has been discussed with him and his questions answered.      Fleet Contras, M.D.  Electronically Signed     EA/MEDQ  D:  09/05/2005  T:  09/05/2005  Job:  [id number]

## 2011-04-14 NOTE — H&P (Signed)
Ronald [name], Ronald [name]             ACCOUNT NO.:  [id number]  MEDICAL RECORD NO.:  [id number]           PATIENT TYPE:  E  LOCATION:  WLED                         FACILITY[facility]  [facility]  PHYSICIAN:  Calvert Cantor, M.D.     DATE OF BIRTH:  03-17-1956  DATE OF ADMISSION:  03/14/2011 DATE OF DISCHARGE:                             HISTORY & PHYSICAL   REFERRING PHYSICIAN:  Zadie Rhine, MD  PRIMARY CARE PHYSICIAN:  Jarome Matin, MD  CARDIOLOGIST:  Eduardo Osier. Sharyn Lull, MD  ONCOLOGIST:  Lajuana Matte, MD  PRESENTING COMPLAINT:  Shortness of breath.  HISTORY OF PRESENT ILLNESS:  This is a 55 year old male who presents essentially for shortness of breath and which has been going on for about 2 days.  He also admits to having a cough which is causing left- sided chest pain.  He is coughing up white or clear colored mucus, does not complain of sore throat, does admit to having a runny nose.  He also admits to fevers.  He states his temperature was 101.  Again, symptoms have been going on for a couple of days according to the patient.  PAST MEDICAL HISTORY: 1. Stage IIIA non-small cell lung cancer, status post left-sided     pneumectomy. 2. Myocardial infarct, status post stenting of the LAD. 3. Removal of a cyst or a polyp from the rectum.  SOCIAL HISTORY:  He used to smoke heavily two packs a day for about 20- 25 years, cut back significantly when he was diagnosed with lung cancer, now smokes a cigarette occasionally, does not drink any alcohol, does not use any drugs.  He is not working.  He is single.  He lives with his son.  ALLERGIES:  PENICILLIN.  He is not sure of the allergic reaction.  MEDICATIONS:  He is currently not taking any medications.  He was previously apparently on Coreg, digoxin, Lasix and nitro.  FAMILY HISTORY:  Hypertension.  REVIEW OF SYSTEMS:  GENERAL:  He admits to a 20-pound weight loss over 3 months.  He attributes this to poor appetite and stress  due to certain problems with his son.  As per H and P, he admits to fevers and chills and currently has a headache.  HEENT:  No sore throat or sinus discharge, postnasal drip, but does have some clear drainage from his nose.  No earache.  RESPIRATORY:  As mentioned in H and P.  CARDIAC:  No chest pain other than when he coughs.  No palpitations or pedal edema. GI:  No nausea, vomiting, diarrhea or constipation.  No history of ulcers.  GU:  No dysuria or hematuria.  HEMATOLOGICALLY:  He does admit to easy bruising.  SKIN:  No rash.  MUSCULOSKELETAL:  No arthritis or back pain.  NEUROLOGICALLY:  No history of strokes or seizures. PSYCHOLOGICALLY:  He admits to some anxiety and depression which is most likely situational.  PHYSICAL EXAMINATION:  VITAL SIGNS:  Blood pressure 102/69, pulse 72, respiratory rate 26, temperature 98.1, then increasing to 100.7, oxygen saturation 99% which is on 3 L of O2.  I do not have a room air sat.  EYES:  Pupils equal, round and reactive to light.  Extraocular movements are intact.  Conjunctivae are pink.  No scleral icterus. MOUTH:  Oral mucosa moist. NECK:  Supple.  No thyromegaly, lymphadenopathy or carotid bruits. HEART:  Regular rate and rhythm.  Tachycardic.  No murmurs, rubs or gallops. LUNGS:  Rhonchi throughout the right lung field.  No air movement on the left noted.  He is not obviously tachypneic, but is coughing quite frequently and seems to be in obvious pain when he coughs. ABDOMEN:  Soft, nontender and nondistended.  Bowel sounds positive.  No organomegaly. EXTREMITIES:  No cyanosis, clubbing or edema.  Pedal pulses positive. NEUROLOGICALLY:  Cranial nerves II through XII intact.  Strength intact in all 4 extremities. PSYCHOLOGICALLY:  Awake, alert and oriented x3.  Mood and affect normal. SKIN:  Warm and dry.  No rash or bruising.  BLOOD WORK:  Metabolic panel reveals a potassium of 3.0.  CBC is within normal limits.  Chest x-ray  two-view reveals airspace prominence of the right hilar region and infrahilar lung suspicious for acute pneumonia/ bronchitis.  EKG reveals sinus tach at 106 beats per minute with interventricular delay and LVH.  ASSESSMENT AND PLAN: 1. Right middle lobe pneumonia, community acquired.  He has been     started on Avelox which I will continue.  I will order nebs p.r.n.     I will order Tussionex to help suppress his cough as he is in quite     a lot of pain every time he coughs.  I will order guaifenesin and     Mucinex as well. 2. Hypokalemia.  We will replace this p.o. 3. Pleuritic chest pain.  We will order Vicodin p.r.n. 4. History of lung cancer, status post left-sided pneumonectomy. 5. History of coronary artery disease with stenting of the left     anterior descending (coronary artery).  I suspect the patient does     need to go back on his medications, but at this point he is     hypotensive and, therefore, I will not be starting any of his blood     pressure medications.  He should at least be on a baby aspirin     which I will start for him. 6. Weight loss related to stress and situation.  Currently, he is     declining a psych eval. 7. deep venous thrombosis prophylaxis with Lovenox.  Time on admission was 45 minutes.     Calvert Cantor, M.D.     SR/MEDQ  D:  03/14/2011  T:  03/14/2011  Job:  [id number]  cc:   Jarome Matin, M.D. Fax: [phone]  Electronically Signed by Calvert Cantor M.D. on 04/14/2011 09:19:54 AM

## 2011-04-19 ENCOUNTER — Other Ambulatory Visit (HOSPITAL_COMMUNITY): Payer: Self-pay

## 2011-04-30 ENCOUNTER — Other Ambulatory Visit (HOSPITAL_COMMUNITY): Payer: Medicare Other

## 2011-05-05 ENCOUNTER — Other Ambulatory Visit: Payer: Self-pay | Admitting: Internal Medicine

## 2011-05-05 DIAGNOSIS — C3490 Malignant neoplasm of unspecified part of unspecified bronchus or lung: Secondary | ICD-10-CM

## 2011-05-17 ENCOUNTER — Other Ambulatory Visit: Payer: Self-pay | Admitting: Internal Medicine

## 2011-05-17 ENCOUNTER — Ambulatory Visit (HOSPITAL_COMMUNITY)
Admission: RE | Admit: 2011-05-17 | Discharge: 2011-05-17 | Disposition: A | Discharge status: Home / Self Care | Payer: Medicare Other | Source: Ambulatory Visit | Attending: Internal Medicine | Admitting: Internal Medicine

## 2011-05-17 ENCOUNTER — Encounter (HOSPITAL_BASED_OUTPATIENT_CLINIC_OR_DEPARTMENT_OTHER): Payer: Medicare Other | Admitting: Internal Medicine

## 2011-05-17 DIAGNOSIS — C3410 Malignant neoplasm of upper lobe, unspecified bronchus or lung: Secondary | ICD-10-CM

## 2011-05-17 DIAGNOSIS — R0602 Shortness of breath: Secondary | ICD-10-CM | POA: Insufficient documentation

## 2011-05-17 DIAGNOSIS — I517 Cardiomegaly: Secondary | ICD-10-CM | POA: Insufficient documentation

## 2011-05-17 DIAGNOSIS — Z902 Acquired absence of lung [part of]: Secondary | ICD-10-CM | POA: Insufficient documentation

## 2011-05-17 DIAGNOSIS — C3490 Malignant neoplasm of unspecified part of unspecified bronchus or lung: Secondary | ICD-10-CM

## 2011-05-17 DIAGNOSIS — R599 Enlarged lymph nodes, unspecified: Secondary | ICD-10-CM | POA: Insufficient documentation

## 2011-05-17 LAB — CBC WITH DIFFERENTIAL/PLATELET
BASO%: 0.3 % (ref 0.0–2.0)
EOS%: 1.5 % (ref 0.0–7.0)
HCT: 41.7 % (ref 38.4–49.9)
MCH: 29.8 pg (ref 27.2–33.4)
MCHC: 33.4 g/dL (ref 32.0–36.0)
NEUT%: 73.9 % (ref 39.0–75.0)
lymph#: 1.4 10*3/uL (ref 0.9–3.3)

## 2011-05-17 LAB — CMP (CANCER CENTER ONLY)
ALT(SGPT): 20 U/L (ref 10–47)
AST: 24 U/L (ref 11–38)
Calcium: 9 mg/dL (ref 8.0–10.3)
Chloride: 103 mEq/L (ref 98–108)
Creat: 1.1 mg/dl (ref 0.6–1.2)
Total Bilirubin: 0.5 mg/dl (ref 0.20–1.60)

## 2011-05-17 MED ORDER — IOHEXOL 300 MG/ML  SOLN
80.0000 mL | Freq: Once | INTRAMUSCULAR | Status: AC | PRN
  Administered 2011-05-17: 80 mL via INTRAVENOUS

## 2011-05-21 ENCOUNTER — Emergency Department (HOSPITAL_COMMUNITY)
Admission: EM | Admit: 2011-05-21 | Discharge: 2011-05-21 | Disposition: A | Discharge status: Home / Self Care | Payer: Medicare Other | Attending: Emergency Medicine | Admitting: Emergency Medicine

## 2011-05-21 ENCOUNTER — Emergency Department (HOSPITAL_COMMUNITY): Payer: Medicare Other

## 2011-05-21 DIAGNOSIS — Z85118 Personal history of other malignant neoplasm of bronchus and lung: Secondary | ICD-10-CM | POA: Insufficient documentation

## 2011-05-21 DIAGNOSIS — Z902 Acquired absence of lung [part of]: Secondary | ICD-10-CM | POA: Insufficient documentation

## 2011-05-21 DIAGNOSIS — I252 Old myocardial infarction: Secondary | ICD-10-CM | POA: Insufficient documentation

## 2011-05-21 DIAGNOSIS — R791 Abnormal coagulation profile: Secondary | ICD-10-CM | POA: Insufficient documentation

## 2011-05-21 DIAGNOSIS — F17200 Nicotine dependence, unspecified, uncomplicated: Secondary | ICD-10-CM | POA: Insufficient documentation

## 2011-05-21 DIAGNOSIS — I2510 Atherosclerotic heart disease of native coronary artery without angina pectoris: Secondary | ICD-10-CM | POA: Insufficient documentation

## 2011-05-21 DIAGNOSIS — R079 Chest pain, unspecified: Secondary | ICD-10-CM | POA: Insufficient documentation

## 2011-05-21 LAB — BASIC METABOLIC PANEL
BUN: 12 mg/dL (ref 6–23)
Creatinine, Ser: 0.76 mg/dL (ref 0.50–1.35)
GFR calc Af Amer: >60 mL/min (ref >60)
GFR calc non Af Amer: >60 mL/min (ref >60)

## 2011-05-21 LAB — CBC
HCT: 40 % (ref 39.0–52.0)
MCH: 29 pg (ref 26.0–34.0)
MCHC: 33.8 g/dL (ref 30.0–36.0)
MCV: 86 fL (ref 78.0–100.0)
RDW: 15.4 % (ref 11.5–15.5)

## 2011-05-21 LAB — DIFFERENTIAL
Basophils Relative: 0 % (ref 0–1)
Eosinophils Relative: 1 % (ref 0–5)
Lymphs Abs: 1.7 10*3/uL (ref 0.7–4.0)
Monocytes Absolute: 0.7 10*3/uL (ref 0.1–1.0)
Neutro Abs: 7 10*3/uL (ref 1.7–7.7)

## 2011-05-21 LAB — D-DIMER, QUANTITATIVE: D-Dimer, Quant: 1.56 ug/mL-FEU — ABNORMAL HIGH (ref 0.00–0.48)

## 2011-05-21 MED ORDER — IOHEXOL 300 MG/ML  SOLN
80.0000 mL | Freq: Once | INTRAMUSCULAR | Status: AC | PRN
  Administered 2011-05-21: 80 mL via INTRAVENOUS

## 2011-05-26 ENCOUNTER — Other Ambulatory Visit: Payer: Self-pay | Admitting: Internal Medicine

## 2011-05-26 ENCOUNTER — Encounter (HOSPITAL_BASED_OUTPATIENT_CLINIC_OR_DEPARTMENT_OTHER): Payer: Medicare Other | Admitting: Internal Medicine

## 2011-05-26 DIAGNOSIS — C3410 Malignant neoplasm of upper lobe, unspecified bronchus or lung: Secondary | ICD-10-CM

## 2011-05-26 DIAGNOSIS — C3490 Malignant neoplasm of unspecified part of unspecified bronchus or lung: Secondary | ICD-10-CM

## 2011-06-01 ENCOUNTER — Other Ambulatory Visit (HOSPITAL_COMMUNITY): Payer: Self-pay | Admitting: Cardiology

## 2011-06-09 ENCOUNTER — Other Ambulatory Visit (HOSPITAL_COMMUNITY): Payer: Medicare Other

## 2011-06-09 ENCOUNTER — Ambulatory Visit (HOSPITAL_COMMUNITY): Payer: Medicare Other

## 2011-08-12 LAB — POCT CARDIAC MARKERS
CKMB, poc: 1.6
CKMB, poc: >80
Myoglobin, poc: 90.8
Myoglobin, poc: 132
Operator id: 151321
Operator id: 146091
Operator id: 146091
Troponin i, poc: <0.05
Troponin i, poc: 7.67

## 2011-08-12 LAB — COMPREHENSIVE METABOLIC PANEL
ALT: 21
AST: 21
AST: 89 — ABNORMAL HIGH
Albumin: 3.4 — ABNORMAL LOW
Alkaline Phosphatase: 62
BUN: 9
CO2: 27
CO2: 21
Calcium: 9
Chloride: 102
Chloride: 103
Creatinine, Ser: 1.1
Creatinine, Ser: 0.85
GFR calc Af Amer: >60
GFR calc Af Amer: >60
GFR calc non Af Amer: >60
GFR calc non Af Amer: >60
Glucose, Bld: 78
Potassium: 3.7
Total Bilirubin: 0.7
Total Bilirubin: 1.4 — ABNORMAL HIGH

## 2011-08-12 LAB — CBC
HCT: 43.4
HCT: 37.4 — ABNORMAL LOW
HCT: 37.8 — ABNORMAL LOW
HCT: 39.6
HCT: 40.1
Hemoglobin: 14.6
Hemoglobin: 13.4
Hemoglobin: 13.6
MCHC: 33.6
MCHC: 33.3
MCHC: 34
MCHC: 34.2
MCHC: 34.5
MCV: 90.2
MCV: 89.8
MCV: 89.8
MCV: 90
MCV: 90.5
Platelets: 205
Platelets: 222
Platelets: 225
Platelets: 252
Platelets: 256
Platelets: 259
RBC: 4.81
RBC: 4.05 — ABNORMAL LOW
RBC: 4.18 — ABNORMAL LOW
RDW: 14.5
RDW: 14.7
RDW: 15.1
WBC: 6.4
WBC: 10.4
WBC: 12.2 — ABNORMAL HIGH
WBC: 13.1 — ABNORMAL HIGH
WBC: 9.4

## 2011-08-12 LAB — BASIC METABOLIC PANEL
BUN: 11
BUN: 12
BUN: 9
CO2: 22
CO2: 23
Calcium: 8.6
Calcium: 8.7
Chloride: 101
Chloride: 102
Chloride: 102
Creatinine, Ser: 1.04
Creatinine, Ser: 1.1
GFR calc Af Amer: >60
GFR calc Af Amer: >60
GFR calc non Af Amer: >60
GFR calc non Af Amer: >60
Glucose, Bld: 110 — ABNORMAL HIGH
Potassium: 3.8
Potassium: 3.9
Sodium: 132 — ABNORMAL LOW
Sodium: 140

## 2011-08-12 LAB — EXPECTORATED SPUTUM ASSESSMENT W GRAM STAIN, RFLX TO RESP C

## 2011-08-12 LAB — LIPID PANEL
HDL: 34 — ABNORMAL LOW
LDL Cholesterol: 80
Triglycerides: 41
VLDL: 8

## 2011-08-12 LAB — DIFFERENTIAL
Basophils Absolute: 0
Eosinophils Absolute: 0.1
Lymphocytes Relative: 32
Lymphocytes Relative: 19
Lymphs Abs: 2.1
Lymphs Abs: 1.8
Monocytes Relative: 10
Neutro Abs: 3.6
Neutro Abs: 6.9
Neutrophils Relative %: 56
Neutrophils Relative %: 73

## 2011-08-12 LAB — POCT I-STAT, CHEM 8
BUN: 13
Creatinine, Ser: 1.2
Hemoglobin: 15.3
Potassium: 4.3
Sodium: 135

## 2011-08-12 LAB — TROPONIN I
Troponin I: 2.01
Troponin I: 2.75

## 2011-08-12 LAB — CULTURE, BLOOD (ROUTINE X 2): Culture: NO GROWTH

## 2011-08-12 LAB — CARDIAC PANEL(CRET KIN+CKTOT+MB+TROPI)
CK, MB: 3.3
Relative Index: INVALID
Total CK: 97
Troponin I: 1.09
Troponin I: 3.04
Troponin I: 3.58

## 2011-08-12 LAB — H1N1 SCREEN (PCR): H1N1 Virus Scrn: NOT DETECTED

## 2011-08-12 LAB — CULTURE, RESPIRATORY W GRAM STAIN

## 2011-08-12 LAB — CK TOTAL AND CKMB (NOT AT ARMC)
CK, MB: 3.5
Relative Index: 13 — ABNORMAL HIGH
Relative Index: 2.4
Relative Index: 8.6 — ABNORMAL HIGH
Total CK: 143

## 2011-08-12 LAB — HEPARIN LEVEL (UNFRACTIONATED): Heparin Unfractionated: 0.56

## 2011-08-12 LAB — B-NATRIURETIC PEPTIDE (CONVERTED LAB): Pro B Natriuretic peptide (BNP): 368 — ABNORMAL HIGH

## 2011-08-23 ENCOUNTER — Other Ambulatory Visit: Payer: Self-pay | Admitting: Internal Medicine

## 2011-08-23 ENCOUNTER — Ambulatory Visit (HOSPITAL_COMMUNITY)
Admission: RE | Admit: 2011-08-23 | Discharge: 2011-08-23 | Disposition: A | Discharge status: Home / Self Care | Payer: Medicare Other | Source: Ambulatory Visit | Attending: Internal Medicine | Admitting: Internal Medicine

## 2011-08-23 ENCOUNTER — Encounter (HOSPITAL_BASED_OUTPATIENT_CLINIC_OR_DEPARTMENT_OTHER): Payer: Medicare Other | Admitting: Internal Medicine

## 2011-08-23 ENCOUNTER — Encounter (HOSPITAL_COMMUNITY): Payer: Self-pay

## 2011-08-23 DIAGNOSIS — C3490 Malignant neoplasm of unspecified part of unspecified bronchus or lung: Secondary | ICD-10-CM

## 2011-08-23 LAB — CBC WITH DIFFERENTIAL/PLATELET
BASO%: 0.8 % (ref 0.0–2.0)
Eosinophils Absolute: 0.1 10*3/uL (ref 0.0–0.5)
LYMPH%: 26.9 % (ref 14.0–49.0)
MCHC: 34.8 g/dL (ref 32.0–36.0)
MCV: 85.2 fL (ref 79.3–98.0)
MONO#: 0.6 10*3/uL (ref 0.1–0.9)
MONO%: 9.3 % (ref 0.0–14.0)
NEUT#: 3.9 10*3/uL (ref 1.5–6.5)
RBC: 4.92 10*6/uL (ref 4.20–5.82)
RDW: 15.5 % — ABNORMAL HIGH (ref 11.0–14.6)
WBC: 6.4 10*3/uL (ref 4.0–10.3)
nRBC: 0 % (ref 0–0)

## 2011-08-23 LAB — CMP (CANCER CENTER ONLY)
ALT(SGPT): 19 U/L (ref 10–47)
AST: 22 U/L (ref 11–38)
CO2: 26 mEq/L (ref 18–33)
Creat: 1 mg/dl (ref 0.6–1.2)
Sodium: 141 mEq/L (ref 128–145)
Total Bilirubin: 0.7 mg/dl (ref 0.20–1.60)
Total Protein: 7.6 g/dL (ref 6.4–8.1)

## 2011-08-23 MED ORDER — IOHEXOL 300 MG/ML  SOLN
80.0000 mL | Freq: Once | INTRAMUSCULAR | Status: AC | PRN
  Administered 2011-08-23: 80 mL via INTRAVENOUS

## 2011-08-26 ENCOUNTER — Encounter (HOSPITAL_BASED_OUTPATIENT_CLINIC_OR_DEPARTMENT_OTHER): Payer: Medicare Other | Admitting: Internal Medicine

## 2011-08-26 DIAGNOSIS — Z902 Acquired absence of lung [part of]: Secondary | ICD-10-CM

## 2011-08-26 DIAGNOSIS — Z85118 Personal history of other malignant neoplasm of bronchus and lung: Secondary | ICD-10-CM

## 2011-12-20 ENCOUNTER — Other Ambulatory Visit (HOSPITAL_COMMUNITY): Payer: Self-pay | Admitting: Cardiology

## 2011-12-24 ENCOUNTER — Ambulatory Visit (HOSPITAL_COMMUNITY): Admission: RE | Admit: 2011-12-24 | Payer: Medicare Other | Source: Ambulatory Visit

## 2011-12-24 ENCOUNTER — Ambulatory Visit (HOSPITAL_COMMUNITY): Payer: Medicare Other

## 2011-12-24 ENCOUNTER — Encounter (HOSPITAL_COMMUNITY)
Admission: RE | Admit: 2011-12-24 | Discharge: 2011-12-24 | Disposition: A | Discharge status: Home / Self Care | Payer: Medicare Other | Source: Ambulatory Visit | Attending: Cardiology | Admitting: Cardiology

## 2011-12-24 DIAGNOSIS — R079 Chest pain, unspecified: Secondary | ICD-10-CM | POA: Insufficient documentation

## 2011-12-24 MED ORDER — TECHNETIUM TC 99M TETROFOSMIN IV KIT
10.0000 | PACK | Freq: Once | INTRAVENOUS | Status: AC | PRN
  Administered 2011-12-24: 10 via INTRAVENOUS

## 2011-12-24 MED ORDER — REGADENOSON 0.4 MG/5ML IV SOLN
INTRAVENOUS | Status: AC
  Filled 2011-12-24: qty 5

## 2011-12-24 MED ORDER — TECHNETIUM TC 99M TETROFOSMIN IV KIT
30.0000 | PACK | Freq: Once | INTRAVENOUS | Status: AC | PRN
  Administered 2011-12-24: 30 via INTRAVENOUS

## 2011-12-24 MED ORDER — REGADENOSON 0.4 MG/5ML IV SOLN
0.4000 mg | Freq: Once | INTRAVENOUS | Status: AC
  Administered 2011-12-24: 0.4 mg via INTRAVENOUS

## 2012-01-17 ENCOUNTER — Other Ambulatory Visit: Payer: Self-pay | Admitting: Nephrology

## 2012-01-17 ENCOUNTER — Ambulatory Visit
Admission: RE | Admit: 2012-01-17 | Discharge: 2012-01-17 | Disposition: A | Discharge status: Home / Self Care | Payer: Medicare Other | Source: Ambulatory Visit | Attending: Nephrology | Admitting: Nephrology

## 2012-01-17 DIAGNOSIS — F17200 Nicotine dependence, unspecified, uncomplicated: Secondary | ICD-10-CM

## 2012-02-01 ENCOUNTER — Telehealth: Payer: Self-pay | Admitting: Internal Medicine

## 2012-02-01 NOTE — Telephone Encounter (Signed)
recd call from [facility]ts [facility] for appts,per mosaiq 10/14 appt made and printed for pt     aom

## 2012-04-05 ENCOUNTER — Other Ambulatory Visit: Payer: Self-pay

## 2012-04-05 ENCOUNTER — Emergency Department (HOSPITAL_COMMUNITY): Payer: Medicare Other

## 2012-04-05 ENCOUNTER — Inpatient Hospital Stay (HOSPITAL_COMMUNITY)
Admission: EM | Admit: 2012-04-05 | Discharge: 2012-04-08 | DRG: 247 | Disposition: A | Discharge status: Home / Self Care | Payer: Medicare Other | Attending: Internal Medicine | Admitting: Internal Medicine

## 2012-04-05 ENCOUNTER — Encounter (HOSPITAL_COMMUNITY): Payer: Self-pay | Admitting: Emergency Medicine

## 2012-04-05 DIAGNOSIS — F17200 Nicotine dependence, unspecified, uncomplicated: Secondary | ICD-10-CM | POA: Diagnosis present

## 2012-04-05 DIAGNOSIS — C3490 Malignant neoplasm of unspecified part of unspecified bronchus or lung: Secondary | ICD-10-CM | POA: Diagnosis present

## 2012-04-05 DIAGNOSIS — Z79899 Other long term (current) drug therapy: Secondary | ICD-10-CM

## 2012-04-05 DIAGNOSIS — Z955 Presence of coronary angioplasty implant and graft: Secondary | ICD-10-CM

## 2012-04-05 DIAGNOSIS — I10 Essential (primary) hypertension: Secondary | ICD-10-CM | POA: Diagnosis present

## 2012-04-05 DIAGNOSIS — R0902 Hypoxemia: Secondary | ICD-10-CM

## 2012-04-05 DIAGNOSIS — I2510 Atherosclerotic heart disease of native coronary artery without angina pectoris: Secondary | ICD-10-CM | POA: Diagnosis present

## 2012-04-05 DIAGNOSIS — K5900 Constipation, unspecified: Secondary | ICD-10-CM | POA: Diagnosis present

## 2012-04-05 DIAGNOSIS — F1910 Other psychoactive substance abuse, uncomplicated: Secondary | ICD-10-CM | POA: Diagnosis present

## 2012-04-05 DIAGNOSIS — Z9861 Coronary angioplasty status: Secondary | ICD-10-CM

## 2012-04-05 DIAGNOSIS — I5023 Acute on chronic systolic (congestive) heart failure: Principal | ICD-10-CM | POA: Diagnosis present

## 2012-04-05 DIAGNOSIS — I340 Nonrheumatic mitral (valve) insufficiency: Secondary | ICD-10-CM | POA: Diagnosis present

## 2012-04-05 DIAGNOSIS — E7800 Pure hypercholesterolemia, unspecified: Secondary | ICD-10-CM | POA: Diagnosis present

## 2012-04-05 DIAGNOSIS — I2589 Other forms of chronic ischemic heart disease: Secondary | ICD-10-CM | POA: Diagnosis present

## 2012-04-05 DIAGNOSIS — J441 Chronic obstructive pulmonary disease with (acute) exacerbation: Secondary | ICD-10-CM

## 2012-04-05 DIAGNOSIS — F1410 Cocaine abuse, uncomplicated: Secondary | ICD-10-CM | POA: Diagnosis present

## 2012-04-05 DIAGNOSIS — Z720 Tobacco use: Secondary | ICD-10-CM | POA: Diagnosis present

## 2012-04-05 DIAGNOSIS — J440 Chronic obstructive pulmonary disease with acute lower respiratory infection: Secondary | ICD-10-CM | POA: Diagnosis present

## 2012-04-05 DIAGNOSIS — R0602 Shortness of breath: Secondary | ICD-10-CM

## 2012-04-05 DIAGNOSIS — Z85118 Personal history of other malignant neoplasm of bronchus and lung: Secondary | ICD-10-CM

## 2012-04-05 DIAGNOSIS — I509 Heart failure, unspecified: Secondary | ICD-10-CM | POA: Diagnosis present

## 2012-04-05 DIAGNOSIS — I5021 Acute systolic (congestive) heart failure: Secondary | ICD-10-CM | POA: Diagnosis present

## 2012-04-05 DIAGNOSIS — Z880 Allergy status to penicillin: Secondary | ICD-10-CM

## 2012-04-05 DIAGNOSIS — Z7982 Long term (current) use of aspirin: Secondary | ICD-10-CM

## 2012-04-05 DIAGNOSIS — J209 Acute bronchitis, unspecified: Secondary | ICD-10-CM | POA: Diagnosis present

## 2012-04-05 DIAGNOSIS — Z9889 Other specified postprocedural states: Secondary | ICD-10-CM

## 2012-04-05 DIAGNOSIS — I059 Rheumatic mitral valve disease, unspecified: Secondary | ICD-10-CM | POA: Diagnosis present

## 2012-04-05 DIAGNOSIS — T699XXA Effect of reduced temperature, unspecified, initial encounter: Secondary | ICD-10-CM | POA: Diagnosis present

## 2012-04-05 LAB — DIFFERENTIAL
Basophils Relative: 1 % (ref 0–1)
Lymphs Abs: 2.4 10*3/uL (ref 0.7–4.0)
Monocytes Relative: 10 % (ref 3–12)
Neutro Abs: 5.1 10*3/uL (ref 1.7–7.7)
Neutrophils Relative %: 60 % (ref 43–77)

## 2012-04-05 LAB — CBC
Hemoglobin: 13.3 g/dL (ref 13.0–17.0)
Platelets: 304 10*3/uL (ref 150–400)
RBC: 4.51 MIL/uL (ref 4.22–5.81)

## 2012-04-05 LAB — COMPREHENSIVE METABOLIC PANEL
ALT: 57 U/L — ABNORMAL HIGH (ref 0–53)
Albumin: 2.9 g/dL — ABNORMAL LOW (ref 3.5–5.2)
Alkaline Phosphatase: 175 U/L — ABNORMAL HIGH (ref 39–117)
BUN: 17 mg/dL (ref 6–23)
Chloride: 103 mEq/L (ref 96–112)
Glucose, Bld: 99 mg/dL (ref 70–99)
Potassium: 3.6 mEq/L (ref 3.5–5.1)
Sodium: 137 mEq/L (ref 135–145)
Total Bilirubin: 0.4 mg/dL (ref 0.3–1.2)

## 2012-04-05 LAB — POCT I-STAT TROPONIN I: Troponin i, poc: 0.06 ng/mL (ref 0.00–0.08)

## 2012-04-05 NOTE — ED Notes (Signed)
Pt ambulated from room to end of the hall. Beginning sats were 98% ending says 90% . Pt get labored very easily with activity

## 2012-04-05 NOTE — ED Provider Notes (Signed)
History     CSN: [id number]  Arrival date & time 04/05/12  1910   First MD Initiated Contact with Patient 04/05/12 2126      Chief Complaint  Patient presents with  . Shortness of Breath  . Weakness    (Consider location/radiation/quality/duration/timing/severity/associated sxs/prior treatment) HPI Comments: Patient with history of COPD, CHF, MI, lung cancer with left lung pneumonectomy presents emergency department with chief complaint of shortness of breath.  Onset of symptoms began 2 days ago and have gradually been worsening.  Associated symptoms include right-sided chest pain with a severity of 8/10, but the patient is currently pain-free.  Pain does not radiate and is described as sharp and intermittent in nature.  Patient denies any fevers, night sweats, chills, chest pain, leg swelling, claudication, decrease in appetite or weight loss.  Associated symptoms include productive cough, dyspnea on exertion, weakness, and fatigue.   PCP: Dr. Bascom Levels Cardiology: Dr. Sharyn Lull Oncology: Dr. Ala Bent   Patient is a 56 y.o. male presenting with shortness of breath and weakness. The history is provided by the patient.  Shortness of Breath  Associated symptoms include cough and shortness of breath. Pertinent negatives include no chest pain, no fever, no stridor and no wheezing.  Weakness Primary symptoms do not include headaches, dizziness or fever.  Additional symptoms include weakness.    Past Medical History  Diagnosis Date  . COPD (chronic obstructive pulmonary disease)   . CHF (congestive heart failure)   . lung ca dx'd 2005    chemo/xrt comp 2005, lung ca    Past Surgical History  Procedure Date  . Coronary angioplasty with stent placement   . Left lung removed     Family History  Problem Relation Age of Onset  . Hypertension Other   . Diabetes Other     History  Substance Use Topics  . Smoking status: Current Some Day Smoker    Types: Cigarettes  . Smokeless  tobacco: Not on file  . Alcohol Use: No     former      Review of Systems  Constitutional: Positive for fatigue. Negative for fever, chills and appetite change.  HENT: Negative for congestion.   Eyes: Negative for visual disturbance.  Respiratory: Positive for cough and shortness of breath. Negative for chest tightness, wheezing and stridor.   Cardiovascular: Negative for chest pain and leg swelling.  Gastrointestinal: Negative for abdominal pain.  Genitourinary: Negative for dysuria, urgency and frequency.  Neurological: Positive for weakness. Negative for dizziness, syncope, light-headedness, numbness and headaches.  Psychiatric/Behavioral: Negative for confusion.  All other systems reviewed and are negative.    Allergies  Penicillins  Home Medications   Current Outpatient Rx  Name Route Sig Dispense Refill  . AMITRIPTYLINE HCL 25 MG PO TABS Oral Take 25 mg by mouth at bedtime.    . ASPIRIN 325 MG PO TABS Oral Take 325 mg by mouth daily.    . ATORVASTATIN CALCIUM 20 MG PO TABS Oral Take 20 mg by mouth daily.    Marland Kitchen CARVEDILOL 3.125 MG PO TABS Oral Take 3.125 mg by mouth 2 (two) times daily with a meal.    . CLOPIDOGREL BISULFATE 75 MG PO TABS Oral Take 75 mg by mouth daily.    . OXYCODONE-ACETAMINOPHEN 7.5-325 MG PO TABS Oral Take 1 tablet by mouth every 6 (six) hours as needed.    Marland Kitchen ZOLPIDEM TARTRATE 5 MG PO TABS Oral Take 5 mg by mouth at bedtime as needed.  BP 119/79  Pulse 96  Temp(Src) 97.6 F (36.4 C) (Oral)  Resp 20  SpO2 94%  Physical Exam  Nursing note and vitals reviewed. Constitutional: He is oriented to person, place, and time. He appears well-developed and well-nourished. No distress.  HENT:  Head: Normocephalic and atraumatic.  Eyes: Conjunctivae and EOM are normal.  Neck: Normal range of motion.  Cardiovascular:       RRR, no aberrancy on ausculation, intact distal pulses, no pitting edema   Pulmonary/Chest: Effort normal.       Right lung no  rhonchi or wheezing. Mild respiratory distress when O2 off (o2 Sat on RA is 92%), no accessory muscle use, or nasal flaring.   Musculoskeletal: Normal range of motion.  Neurological: He is alert and oriented to person, place, and time.  Skin: Skin is warm and dry. No rash noted. He is not diaphoretic.  Psychiatric: He has a normal mood and affect. His behavior is normal.    ED Course  Procedures (including critical care time)  Labs Reviewed  CBC - Abnormal; Notable for the following:    HCT 37.9 (*)    RDW 15.7 (*)    All other components within normal limits  COMPREHENSIVE METABOLIC PANEL - Abnormal; Notable for the following:    Albumin 2.9 (*)    AST 49 (*)    ALT 57 (*)    Alkaline Phosphatase 175 (*)    All other components within normal limits  DIFFERENTIAL  POCT I-STAT TROPONIN I   Dg Chest 2 View  04/05/2012  *RADIOLOGY REPORT*  Clinical Data: Shortness of breath  CHEST - 2 VIEW  Comparison: 01/17/2012  Findings: Status post left pneumonectomy with mediastinal shift into the left hemithorax.  Coronary artery stent noted. Right hilar fullness is similar to prior.  Otherwise, cannot evaluate the cardiomediastinal contours. Hyperinflation with interstitial prominence on the right.  No pneumothorax on the right. 1 cm nodular density not seen previously may be artifactual due to superimposed shadows.  Attention on follow-up.  No acute osseous finding.  IMPRESSION: Status post left pneumonectomy.  Right lung emphysema.  Interstitial prominence may reflect chronic change or pulmonary edema.  1 cm nodular density within the right lung was not seen on recent prior, therefore may be artifactual.  Attention on follow-up.  Original Report Authenticated By: Waneta Martins, M.D.     No diagnosis found.   Date: 04/06/2012  Rate: 93  Rhythm: normal sinus rhythm  QRS Axis: left  Intervals: normal  ST/T Wave abnormalities: nonspecific T wave changes  Conduction Disutrbances:none and  left anterior fascicular block  Narrative Interpretation:   Old EKG Reviewed: changes noted     MDM  COPD exacerbation, Hypoxia  Patient unable to maintain pulse oxygenation greater than 90 with ambulation.  He is to be admitted for hypoxia and COPD exacerbation.  Imaging results discussed with patient and he is aware of the 1 cm nodular density in the right lung that needs followup. CT angio chest and BNP pending. Admit to [facility].     1:13 AM Pt with increased breathing difficulty, accessory muscle use and is now in respiratory distress. ABG ordered, respiratory consulted.  Secretary asked to page admitting doctor to notify him. Above imaging still pending. RR 30, HR 110, pulse ox 92 on 1 L [place]. Admission bed changed to step down.     Jaci Carrel, [place] 04/06/12 [phone]

## 2012-04-05 NOTE — ED Notes (Signed)
Pt called out stating he was feeling more short of breath, pt noted to be low in the bed and laying on his right side, pt pulled up in bed and repositioned to a straight upright position, states he feels better, pt stated that sometimes when he lays on his right side he starts to feel short of breath, pt speaking in full sentences and stating he is breathing easier.

## 2012-04-05 NOTE — ED Notes (Signed)
Pt states he has been feeling short of breath and his legs are hurting him and he feels weak  Pt states shortness of breath started 2 days ago  Pt states he has a productive cough varies in color

## 2012-04-06 ENCOUNTER — Encounter (HOSPITAL_COMMUNITY): Payer: Self-pay | Admitting: Internal Medicine

## 2012-04-06 ENCOUNTER — Emergency Department (HOSPITAL_COMMUNITY): Payer: Medicare Other

## 2012-04-06 DIAGNOSIS — R0602 Shortness of breath: Secondary | ICD-10-CM

## 2012-04-06 DIAGNOSIS — I509 Heart failure, unspecified: Secondary | ICD-10-CM

## 2012-04-06 DIAGNOSIS — C3490 Malignant neoplasm of unspecified part of unspecified bronchus or lung: Secondary | ICD-10-CM | POA: Diagnosis present

## 2012-04-06 DIAGNOSIS — F1410 Cocaine abuse, uncomplicated: Secondary | ICD-10-CM

## 2012-04-06 DIAGNOSIS — F1910 Other psychoactive substance abuse, uncomplicated: Secondary | ICD-10-CM

## 2012-04-06 DIAGNOSIS — I059 Rheumatic mitral valve disease, unspecified: Secondary | ICD-10-CM

## 2012-04-06 DIAGNOSIS — I340 Nonrheumatic mitral (valve) insufficiency: Secondary | ICD-10-CM | POA: Diagnosis present

## 2012-04-06 DIAGNOSIS — I2510 Atherosclerotic heart disease of native coronary artery without angina pectoris: Secondary | ICD-10-CM | POA: Diagnosis present

## 2012-04-06 LAB — CARDIAC PANEL(CRET KIN+CKTOT+MB+TROPI)
CK, MB: 4.1 ng/mL — ABNORMAL HIGH (ref 0.3–4.0)
Relative Index: INVALID (ref 0.0–2.5)
Relative Index: 3 — ABNORMAL HIGH (ref 0.0–2.5)
Total CK: 99 U/L (ref 7–232)
Total CK: 83 U/L (ref 7–232)
Total CK: 123 U/L (ref 7–232)
Troponin I: <0.3 ng/mL (ref <0.30)

## 2012-04-06 LAB — BLOOD GAS, ARTERIAL
Bicarbonate: 20.2 mEq/L (ref 20.0–24.0)
Drawn by: 362741
O2 Content: 2 L/min
O2 Saturation: 92.1 %
pCO2 arterial: 32.2 mmHg — ABNORMAL LOW (ref 35.0–45.0)
pO2, Arterial: 72.4 mmHg — ABNORMAL LOW (ref 80.0–100.0)

## 2012-04-06 LAB — PROTIME-INR
INR: 1.17 (ref 0.00–1.49)
Prothrombin Time: 15.1 seconds (ref 11.6–15.2)

## 2012-04-06 LAB — RAPID URINE DRUG SCREEN, HOSP PERFORMED: Barbiturates: NOT DETECTED

## 2012-04-06 MED ORDER — ATORVASTATIN CALCIUM 80 MG PO TABS
80.0000 mg | ORAL_TABLET | Freq: Every day | ORAL | Status: AC
  Administered 2012-04-06: 80 mg via ORAL
  Filled 2012-04-06: qty 1

## 2012-04-06 MED ORDER — DILTIAZEM HCL 30 MG PO TABS
30.0000 mg | ORAL_TABLET | Freq: Four times a day (QID) | ORAL | Status: DC
  Administered 2012-04-06 – 2012-04-07 (×3): 30 mg via ORAL
  Filled 2012-04-06 (×7): qty 1

## 2012-04-06 MED ORDER — CLOPIDOGREL BISULFATE 75 MG PO TABS
75.0000 mg | ORAL_TABLET | Freq: Every day | ORAL | Status: DC
  Administered 2012-04-06 – 2012-04-07 (×2): 75 mg via ORAL
  Filled 2012-04-06 (×2): qty 1

## 2012-04-06 MED ORDER — IOHEXOL 300 MG/ML  SOLN
100.0000 mL | Freq: Once | INTRAMUSCULAR | Status: AC | PRN
  Administered 2012-04-06: 100 mL via INTRAVENOUS

## 2012-04-06 MED ORDER — ENOXAPARIN SODIUM 40 MG/0.4ML ~~LOC~~ SOLN
40.0000 mg | SUBCUTANEOUS | Status: DC
  Administered 2012-04-06: 40 mg via SUBCUTANEOUS
  Filled 2012-04-06 (×2): qty 0.4

## 2012-04-06 MED ORDER — FUROSEMIDE 10 MG/ML IJ SOLN
40.0000 mg | Freq: Once | INTRAMUSCULAR | Status: AC
  Administered 2012-04-06: 40 mg via INTRAVENOUS
  Filled 2012-04-06: qty 4

## 2012-04-06 MED ORDER — AMITRIPTYLINE HCL 25 MG PO TABS
25.0000 mg | ORAL_TABLET | Freq: Every day | ORAL | Status: DC
  Administered 2012-04-06 – 2012-04-07 (×2): 25 mg via ORAL
  Filled 2012-04-06 (×4): qty 1

## 2012-04-06 MED ORDER — OXYCODONE-ACETAMINOPHEN 5-325 MG PO TABS
1.5000 | ORAL_TABLET | Freq: Four times a day (QID) | ORAL | Status: DC | PRN
  Administered 2012-04-06 – 2012-04-08 (×2): 1.5 via ORAL
  Filled 2012-04-06: qty 1
  Filled 2012-04-06: qty 2
  Filled 2012-04-06: qty 1

## 2012-04-06 MED ORDER — SIMVASTATIN 40 MG PO TABS
40.0000 mg | ORAL_TABLET | Freq: Every day | ORAL | Status: DC
  Filled 2012-04-06: qty 1

## 2012-04-06 MED ORDER — FUROSEMIDE 10 MG/ML IJ SOLN
40.0000 mg | Freq: Three times a day (TID) | INTRAMUSCULAR | Status: DC
  Administered 2012-04-06: 40 mg via INTRAVENOUS
  Filled 2012-04-06 (×6): qty 4

## 2012-04-06 MED ORDER — SODIUM CHLORIDE 0.9 % IJ SOLN
3.0000 mL | Freq: Two times a day (BID) | INTRAMUSCULAR | Status: DC
  Administered 2012-04-06 – 2012-04-07 (×4): 3 mL via INTRAVENOUS

## 2012-04-06 MED ORDER — NITROGLYCERIN 0.4 MG/HR TD PT24
0.4000 mg | MEDICATED_PATCH | Freq: Every day | TRANSDERMAL | Status: DC
  Administered 2012-04-06 – 2012-04-07 (×2): 0.4 mg via TRANSDERMAL
  Filled 2012-04-06 (×2): qty 1

## 2012-04-06 MED ORDER — OXYCODONE-ACETAMINOPHEN 7.5-325 MG PO TABS: 1.0000 | ORAL_TABLET | Freq: Four times a day (QID) | ORAL | Status: DC | PRN

## 2012-04-06 MED ORDER — SODIUM CHLORIDE 0.9 % IV BOLUS (SEPSIS)
250.0000 mL | Freq: Once | INTRAVENOUS | Status: AC
  Administered 2012-04-06: 250 mL via INTRAVENOUS

## 2012-04-06 MED ORDER — ACETAMINOPHEN 650 MG RE SUPP: 650.0000 mg | Freq: Four times a day (QID) | RECTAL | Status: DC | PRN

## 2012-04-06 MED ORDER — ONDANSETRON HCL 4 MG PO TABS: 4.0000 mg | ORAL_TABLET | Freq: Four times a day (QID) | ORAL | Status: DC | PRN

## 2012-04-06 MED ORDER — ACETAMINOPHEN 325 MG PO TABS: 650.0000 mg | ORAL_TABLET | Freq: Four times a day (QID) | ORAL | Status: DC | PRN

## 2012-04-06 MED ORDER — SODIUM CHLORIDE 0.9 % IJ SOLN: 3.0000 mL | Freq: Two times a day (BID) | INTRAMUSCULAR | Status: DC

## 2012-04-06 MED ORDER — ASPIRIN 325 MG PO TABS
325.0000 mg | ORAL_TABLET | Freq: Every day | ORAL | Status: DC
  Administered 2012-04-06: 325 mg via ORAL
  Filled 2012-04-06 (×2): qty 1

## 2012-04-06 MED ORDER — ONDANSETRON HCL 4 MG/2ML IJ SOLN: 4.0000 mg | Freq: Four times a day (QID) | INTRAMUSCULAR | Status: DC | PRN

## 2012-04-06 MED ORDER — ATORVASTATIN CALCIUM 80 MG PO TABS: 80.0000 mg | ORAL_TABLET | Freq: Every day | ORAL | Status: DC

## 2012-04-06 MED ORDER — ZOLPIDEM TARTRATE 5 MG PO TABS: 5.0000 mg | ORAL_TABLET | Freq: Every evening | ORAL | Status: DC | PRN

## 2012-04-06 MED ORDER — ASPIRIN EC 325 MG PO TBEC: 325.0000 mg | DELAYED_RELEASE_TABLET | Freq: Every day | ORAL | Status: DC

## 2012-04-06 NOTE — ED Notes (Signed)
Patient denies pain and is resting comfortably.  

## 2012-04-06 NOTE — Consult Note (Signed)
Name: Ronald [name] MRN: [id number] DOB: 10-Oct-1956    LOS: 1  Referring Provider:  Toniann Fail Reason for Referral:  Respiratory failure  PULMONARY / CRITICAL CARE MEDICINE  HPI:  This is a 56 y/o male with CHF, lung cancer s/p pneumonectomy and cocaine abuse who presented to the [facility] ED on 5/23 with shortness of breath.  The symptoms had been progressing for two days prior to admission, and apparently started after he used cocaine.  He noted some clear sputum production and leg swelling.  In the ED PCCM was consulted by the hospitalist service for increased work of breathing.  Upon arrival of the PCCM MD he had been given lasix and stated that he was feeling better.  Past Medical History  Diagnosis Date  . COPD (chronic obstructive pulmonary disease)   . CHF (congestive heart failure)   . lung ca dx'd 2005    chemo/xrt comp 2005, lung ca   Past Surgical History  Procedure Date  . Coronary angioplasty with stent placement   . Left lung removed    Prior to Admission medications   Medication Sig Start Date End Date Taking? Authorizing Provider  amitriptyline (ELAVIL) 25 MG tablet Take 25 mg by mouth at bedtime.   Yes Historical Provider, MD  aspirin 325 MG tablet Take 325 mg by mouth daily.   Yes Historical Provider, MD  atorvastatin (LIPITOR) 20 MG tablet Take 20 mg by mouth daily.   Yes Historical Provider, MD  carvedilol (COREG) 3.125 MG tablet Take 3.125 mg by mouth 2 (two) times daily with a meal.   Yes Historical Provider, MD  clopidogrel (PLAVIX) 75 MG tablet Take 75 mg by mouth daily.   Yes Historical Provider, MD  oxyCODONE-acetaminophen (PERCOCET) 7.5-325 MG per tablet Take 1 tablet by mouth every 6 (six) hours as needed. For pain   Yes Historical Provider, MD  zolpidem (AMBIEN) 5 MG tablet Take 5 mg by mouth at bedtime as needed. For sleep   Yes Historical Provider, MD   Allergies Allergies  Allergen Reactions  . Penicillins Itching    Family History Family History    Problem Relation Age of Onset  . Hypertension Other   . Diabetes Other    Social History  reports that he has been smoking Cigarettes.  He does not have any smokeless tobacco history on file. He reports that he uses illicit drugs (Cocaine). He reports that he does not drink alcohol.  Review Of Systems:   Gen: Denies fever, chills, weight change, fatigue, night sweats HEENT: Denies blurred vision, double vision, hearing loss, tinnitus, sinus congestion, rhinorrhea, sore throat, neck stiffness, dysphagia PULM: per hpi CV: Denies chest pain, he does note edema, orthopnea, paroxysmal nocturnal dyspnea, no palpitations GI: Denies abdominal pain, nausea, vomiting, diarrhea, hematochezia, melena, constipation, change in bowel habits GU: Denies dysuria, hematuria, polyuria, oliguria, urethral discharge Endocrine: Denies hot or cold intolerance, polyuria, polyphagia or appetite change Derm: Denies rash, dry skin, scaling or peeling skin change Heme: Denies easy bruising, bleeding, bleeding gums Neuro: Denies headache, numbness, weakness, slurred speech, loss of memory or consciousness   Brief patient description:  56/ y/o male with CHF and cocaine abuse who was admitted on 5/23 to the [facility] service at [facility] with respiratory distress due to a CHF exacerbation due to cocaine use.  Events Since Admission:   Current Status:  Vital Signs: Temp:  [97.6 F (36.4 C)-97.9 F (36.6 C)] 97.9 F (36.6 C) (05/23 0357) Pulse Rate:  [94-96] 94  (05/23  4540) Resp:  [20-22] 22  (05/23 0357) BP: (119-120)/(79-84) 120/84 mmHg (05/23 0357) SpO2:  [94 %-98 %] 98 % (05/23 0357) 2L [place]  Physical Examination: Gen: tachypnic, speaking in full sentences, no accessory muscle use HEENT: NCAT, PERRL, EOMi, OP clear, neck supple without masses PULM: Insp rales in R base, some mild wheezing CV: Tachy, regular, loud systolic murmur at apex radiating to axilla, S3 noted AB: BS+, soft, nontender, no hsm Ext: warm,  pitting edema in legs bilaterally, no clubbing, no cyanosis Derm: no rash or skin breakdown Neuro: A&Ox4, CN II-XII intact, strength 5/5 in all 4 extremities   Principal Problem:  *CHF (congestive heart failure) Active Problems:  CAD (coronary artery disease)  Lung cancer  Mitral regurgitation   ASSESSMENT AND PLAN  PULMONARY  Lab 04/06/12 0120  PHART 7.414  PCO2ART 32.2*  PO2ART 72.4*  HCO3 20.2  O2SAT 92.1   Ventilator Settings:   5/22 CXR>> s/p L pneumonectomy, interstitial edema and prominent vascularity 5/23 CT Angio chest >> no PE, small R effusion, R hilar fullness enlarged, interlobular septal thickening  A:  Lung cancer with Prominent hilar fullness on R (slightly enlarged on 5/23 study).  The lymphadenopathy and the parenchymal and pleural changes described by radiology are likely due to his CHF exacerbation.  Suspect that the R hilar mass will decrease in size with diuresis (has been stable with roughly 11 CT scans since 2008!). P:   -discuss with Oncology further dispo -needs repeat CT after adequate diuresis to ensure that the node has decreased, but strongly question the rationale/evidence for continued serial CT chests after that  CARDIOVASCULAR  Lab 04/05/12 2215  TROPONINI --  LATICACIDVEN --  PROBNP 3204.0*   ECG:  Sinus tach, LAD, LAE, LVH, repolarization abnormality Lines: none 2009 TTE: Normal LVEF, LAE, Severe MR, mild AVR  A: Acute exacerbation of CHF due to cocaine use and medication non-compliance (he admits to both) P:  -agree with SDU placement for close observation -he does not want BIPAP (says that he was intolerant before) and considering his subjective improvement after receiving lasix and adequate diuresis it seems likely that he will recover without needing mechanical ventilation -continue lasix as you are doing -consider repeat TTE -continue home b-blocker at 1/2 dose 5/23, then resume at full dose 5/24 -consider adding ACE-In  depending on BP control   Nadim Malia, M.D. Pulmonary and Critical Care Medicine [facility] Pager: [phone]  04/06/2012, 5:46 AM

## 2012-04-06 NOTE — Progress Notes (Signed)
  Echocardiogram 2D Echocardiogram has been performed.  Shawneequa Baldridge L 04/06/2012, 4:14 PM

## 2012-04-06 NOTE — ED Notes (Signed)
Pt sitting on side of bed having labored respirations at a rate of 36 to 40  Pt states he just can't breathe  PA to bedside

## 2012-04-06 NOTE — ED Notes (Signed)
Pt given ham sandwich, chips a hoy cookies, and apple sauce.

## 2012-04-06 NOTE — ED Notes (Signed)
Pt stable. Awaiting admitting Md to consult

## 2012-04-06 NOTE — ED Notes (Signed)
Critical care dr in room with pt

## 2012-04-06 NOTE — ED Notes (Signed)
Pt breathing more labored, Pt placed in sitting position. PA-C made aware. ABG order placed. RT called to obtain gas

## 2012-04-06 NOTE — ED Notes (Signed)
Family at bedside. 

## 2012-04-06 NOTE — ED Notes (Signed)
Called patient's probation officer per patient request.  Spoke with Ronald [name] [phone] informed that Mr. Ronald [name] is currently an inpatient at [facility] and has been here since last night.  He will be in the hospital for a few days and may be at [facility] tomorrow depending on the results of the procedure that will occur tomorrow morning.  Mr. Ronald [name] stated that the patient does not need to see him until July 12th.  Informed patient of upcoming appointment on July 12th.  Patient verbalized understanding.  No other concerns at this time.  Barrie Lyme 3:21 PM 04/06/2012

## 2012-04-06 NOTE — ED Notes (Signed)
Report given to [facility], RN for room 1406.  Patient stable to transfer to room without RN per MD order.  Patient has all belongings including cell phone, clothing, shoes, and wallet.  Denies pain.  Vitals stable.  No other concerns at this time.  02 at 2L [place].

## 2012-04-06 NOTE — ED Notes (Signed)
Vital signs stable. 

## 2012-04-06 NOTE — Progress Notes (Signed)
Ronald [name] was admitted this morning with CHF exacerbation. He was evaluated by pulmonary and cardiology. He feels much better at this time. Plan to have cardiac cath per cardiology. Will continue to monitor. Patient examined chart reviewed.

## 2012-04-06 NOTE — ED Notes (Signed)
Hospitalist at bedside 

## 2012-04-06 NOTE — ED Notes (Signed)
Patient is resting comfortably. 

## 2012-04-06 NOTE — H&P (Signed)
Ronald [name] is an 56 y.o. male.   PCP - Dr.Frazier. Cardiologist - Dr.Harwani. Chief Complaint: Shortness of breath. HPI: 56 year-old male with known history of stage IIIa lung cancer status post pneumonectomy chemotherapy and radiation in 2004, history of CAD status post stenting and polysubstance abuse presented with complaints of shortness of breath which has been worsening last 2 days. Denies any chest pain has some cough with whitish sputum. Denies any fever chills. The shortness of breath his presented exertion and also on lying down. Patient states he did take cocaine 2 days ago. At this time patient is chest pain-free. In the ER patient did have CT angiogram of the chest which at this time showing fluid overload. BNP is also elevated. Patient admitted for decompensated CHF.  Past Medical History  Diagnosis Date  . COPD (chronic obstructive pulmonary disease)   . CHF (congestive heart failure)   . lung ca dx'd 2005    chemo/xrt comp 2005, lung ca    Past Surgical History  Procedure Date  . Coronary angioplasty with stent placement   . Left lung removed     Family History  Problem Relation Age of Onset  . Hypertension Other   . Diabetes Other    Social History:  reports that he has been smoking Cigarettes.  He does not have any smokeless tobacco history on file. He reports that he uses illicit drugs (Cocaine). He reports that he does not drink alcohol.  Allergies:  Allergies  Allergen Reactions  . Penicillins Itching     (Not in a hospital admission)  Results for orders placed during the hospital encounter of 04/05/12 (from the past 48 hour(s))  CBC     Status: Abnormal   Collection Time   04/05/12 10:15 PM      Component Value Range Comment   WBC 8.4  4.0 - 10.5 (K/uL)    RBC 4.51  4.22 - 5.81 (MIL/uL)    Hemoglobin 13.3  13.0 - 17.0 (g/dL)    HCT 69.6 (*) 29.5 - 52.0 (%)    MCV 84.0  78.0 - 100.0 (fL)    MCH 29.5  26.0 - 34.0 (pg)    MCHC 35.1  30.0 - 36.0  (g/dL)    RDW 28.4 (*) 13.2 - 15.5 (%)    Platelets 304  150 - 400 (K/uL)   DIFFERENTIAL     Status: Normal   Collection Time   04/05/12 10:15 PM      Component Value Range Comment   Neutrophils Relative 60  43 - 77 (%)    Neutro Abs 5.1  1.7 - 7.7 (K/uL)    Lymphocytes Relative 28  12 - 46 (%)    Lymphs Abs 2.4  0.7 - 4.0 (K/uL)    Monocytes Relative 10  3 - 12 (%)    Monocytes Absolute 0.8  0.1 - 1.0 (K/uL)    Eosinophils Relative 2  0 - 5 (%)    Eosinophils Absolute 0.1  0.0 - 0.7 (K/uL)    Basophils Relative 1  0 - 1 (%)    Basophils Absolute 0.0  0.0 - 0.1 (K/uL)   COMPREHENSIVE METABOLIC PANEL     Status: Abnormal   Collection Time   04/05/12 10:15 PM      Component Value Range Comment   Sodium 137  135 - 145 (mEq/L)    Potassium 3.6  3.5 - 5.1 (mEq/L)    Chloride 103  96 - 112 (mEq/L)  CO2 24  19 - 32 (mEq/L)    Glucose, Bld 99  70 - 99 (mg/dL)    BUN 17  6 - 23 (mg/dL)    Creatinine, Ser 8.29  0.50 - 1.35 (mg/dL)    Calcium 8.6  8.4 - 10.5 (mg/dL)    Total Protein 7.4  6.0 - 8.3 (g/dL)    Albumin 2.9 (*) 3.5 - 5.2 (g/dL)    AST 49 (*) 0 - 37 (U/L)    ALT 57 (*) 0 - 53 (U/L)    Alkaline Phosphatase 175 (*) 39 - 117 (U/L)    Total Bilirubin 0.4  0.3 - 1.2 (mg/dL)    GFR calc non Af Amer >90  >90 (mL/min)    GFR calc Af Amer >90  >90 (mL/min)   PRO B NATRIURETIC PEPTIDE     Status: Abnormal   Collection Time   04/05/12 10:15 PM      Component Value Range Comment   Pro B Natriuretic peptide (BNP) 3204.0 (*) 0 - 125 (pg/mL)   POCT I-STAT TROPONIN I     Status: Normal   Collection Time   04/05/12 10:18 PM      Component Value Range Comment   Troponin i, poc 0.06  0.00 - 0.08 (ng/mL)    Comment 3            BLOOD GAS, ARTERIAL     Status: Abnormal   Collection Time   04/06/12  1:20 AM      Component Value Range Comment   O2 Content 2.0      Delivery systems NASAL CANNULA      pH, Arterial 7.414  7.350 - 7.450     pCO2 arterial 32.2 (*) 35.0 - 45.0 (mmHg)    pO2,  Arterial 72.4 (*) 80.0 - 100.0 (mmHg)    Bicarbonate 20.2  20.0 - 24.0 (mEq/L)    TCO2 17.8  0 - 100 (mmol/L)    Acid-base deficit 3.0 (*) 0.0 - 2.0 (mmol/L)    O2 Saturation 92.1      Patient temperature 98.6      Collection site RIGHT RADIAL      Drawn by [id number]      Sample type ARTERIAL DRAW      Allens test (pass/fail) PASS  PASS     Dg Chest 2 View  04/05/2012  *RADIOLOGY REPORT*  Clinical Data: Shortness of breath  CHEST - 2 VIEW  Comparison: 01/17/2012  Findings: Status post left pneumonectomy with mediastinal shift into the left hemithorax.  Coronary artery stent noted. Right hilar fullness is similar to prior.  Otherwise, cannot evaluate the cardiomediastinal contours. Hyperinflation with interstitial prominence on the right.  No pneumothorax on the right. 1 cm nodular density not seen previously may be artifactual due to superimposed shadows.  Attention on follow-up.  No acute osseous finding.  IMPRESSION: Status post left pneumonectomy.  Right lung emphysema.  Interstitial prominence may reflect chronic change or pulmonary edema.  1 cm nodular density within the right lung was not seen on recent prior, therefore may be artifactual.  Attention on follow-up.  Original Report Authenticated By: Waneta Martins, M.D.   Ct Angio Chest W/cm &/or Wo Cm  04/06/2012  *RADIOLOGY REPORT*  Clinical Data: Right-sided chest pain  CT ANGIOGRAPHY CHEST  Technique:  Multidetector CT imaging of the chest using the standard protocol during bolus administration of intravenous contrast. Multiplanar reconstructed images including MIPs were obtained and reviewed to evaluate the vascular anatomy.  Contrast: OMNIPAQUE IOHEXOL 300 MG/ML  SOLN  Comparison: 08/23/2011  Findings: The evaluation of the lower lobe branches are degraded by respiratory motion.  Otherwise, no pulmonary arterial branch filling defect is identified.  Normal caliber aorta with scattered atherosclerosis.  Status post left pneumonectomy  with fluid in the surgical bed.  Mediastinal structures are shifted leftward.  The previously described right perihilar lymph nodes appear more prominent on today's study.  In total, measures 2.4 x 3.7 cm as compared to 1.6 x 3.2 cm previously.  It is conceivable that the increased may be exaggerated by pleural fluid tracking along the fissure. Small right pleural effusion collects dependently as well.  Centrolobular emphysematous changes.  Interlobular septal prominence.  Peribronchial thickening detailed parenchymal evaluation is degraded by motion.  No pneumothorax.  The trachea and main bronchi are patent.  Status post left thoracotomy.  No acute osseous abnormality identified.  Limited images through the upper abdomen demonstrate no acute abnormality.  IMPRESSION: No pulmonary embolism identified.  Small right pleural effusion.  Right hilar fullness has increased in the interval in this patient with known prominent right hilar lymph nodes. It is possible that the increase may be exaggerated by adjacent fluid along the fissure. Recommend repeat CT after resolution of the volume overload status.  Interlobular septal prominence and peribronchial thickening may reflect edema or infection.  Lymphangitic spread of tumor not entirely excluded.  Original Report Authenticated By: Waneta Martins, M.D.    Review of Systems  Constitutional: Negative.   HENT: Negative.   Eyes: Negative.   Respiratory: Positive for shortness of breath.   Cardiovascular: Negative.   Gastrointestinal: Negative.   Genitourinary: Negative.   Musculoskeletal: Negative.   Skin: Negative.   Neurological: Negative.   Endo/Heme/Allergies: Negative.   Psychiatric/Behavioral: Negative.     Blood pressure 120/84, pulse 94, temperature 97.9 F (36.6 C), temperature source Oral, resp. rate 22, SpO2 98.00%. Physical Exam  Constitutional: He is oriented to person, place, and time. He appears well-developed and well-nourished. No  distress.  HENT:  Head: Normocephalic and atraumatic.  Right Ear: External ear normal.  Left Ear: External ear normal.  Mouth/Throat: No oropharyngeal exudate.  Eyes: Conjunctivae are normal. Pupils are equal, round, and reactive to light. Right eye exhibits no discharge. Left eye exhibits no discharge. No scleral icterus.  Neck: Normal range of motion. Neck supple.  Cardiovascular: Normal rate and regular rhythm.   Respiratory: Effort normal. No respiratory distress. He has wheezes. He has rales.  GI: Soft. Bowel sounds are normal. He exhibits no distension. There is no tenderness. There is no rebound.  Musculoskeletal: Normal range of motion. He exhibits no edema and no tenderness.  Neurological: He is alert and oriented to person, place, and time.       Moves all extremities.  Skin: Skin is warm and dry. No rash noted. He is not diaphoretic. No erythema.  Psychiatric: His behavior is normal.     Assessment/Plan #1. Decompensated CHF last EF measured in 2009 was 60% - continue his Lasix, I have ordered 40 mg IV q. 8. Placed on nitroglycerin paste. Continue aspirin and his Plavix. I think he has decompensation from using cocaine which I have strongly advised him not to use again. We will cycle cardiac markers. I did discuss with Dr. Sharyn Lull who will be seeing patient in consult. #2. CAD status post stenting - chest which is pain-free but given his decompensated CHF we will cycle cardiac markers to make sure that is  no ACS. Continue aspirin and Plavix. #3. Polysubstance abuse including recent use of cocaine - we'll hold beta blockers for now. Strongly advised not use cocaine. #4. History of lung cancer status post pneumonectomy and radiation chemotherapy - repeat CT angiogram chest after adequate diuresis assisted by radiologist to make sure there is no recurrence of his cancer.  CODE STATUS - full code.   Ferdinando Lodge N. 04/06/2012, 4:03 AM

## 2012-04-06 NOTE — Consult Note (Signed)
Reason for Consult: Congestive heart failure Referring Physician: [facility] hospitalist  Ronald [name] is an 56 y.o. male.  HPI: Patient is 56 year old male with past medical history significant for coronary artery disease status post PTCA stenting to LAD in July of 2009 hypertension hypercholesteremia history of congestive heart failure secondary to valvular dysfunction history of moderately severe mitral regurgitation hypercholesteremia tobacco abuse cocaine abuse history of non-small cell CA off left lung stage IIIa status post left pneumonectomy right hilar adenopathy came to the ER complaining of progressive increasing shortness of breath for last 3 days associated with left-sided precordial chest pain radiating to left arm off and on associated with palpitations. Patient states he has been smoking cocaine and 3-4 times week regularly and now smokes a few cigarettes only used to smoke more than one pack per day for 20+ years. Patient had a nuclear stress test in February of 2013 which showed mild peri-infarct ischemia in the anteroseptal wall patient opted for medical management and did not followup in the office.  Past Medical History  Diagnosis Date  . COPD (chronic obstructive pulmonary disease)   . CHF (congestive heart failure)   . lung ca dx'd 2005    chemo/xrt comp 2005, lung ca    Past Surgical History  Procedure Date  . Coronary angioplasty with stent placement   . Pneumonectomy 2005    left    Family History  Problem Relation Age of Onset  . Hypertension Other   . Diabetes Other     Social History:  reports that he has been smoking Cigarettes.  He does not have any smokeless tobacco history on file. He reports that he uses illicit drugs (Cocaine). He reports that he does not drink alcohol.  Allergies:  Allergies  Allergen Reactions  . Penicillins Itching    Medications: I have reviewed the patient's current medications.  Results for orders placed during the  hospital encounter of 04/05/12 (from the past 48 hour(s))  CBC     Status: Abnormal   Collection Time   04/05/12 10:15 PM      Component Value Range Comment   WBC 8.4  4.0 - 10.5 (K/uL)    RBC 4.51  4.22 - 5.81 (MIL/uL)    Hemoglobin 13.3  13.0 - 17.0 (g/dL)    HCT 40.9 (*) 81.1 - 52.0 (%)    MCV 84.0  78.0 - 100.0 (fL)    MCH 29.5  26.0 - 34.0 (pg)    MCHC 35.1  30.0 - 36.0 (g/dL)    RDW 91.4 (*) 78.2 - 15.5 (%)    Platelets 304  150 - 400 (K/uL)   DIFFERENTIAL     Status: Normal   Collection Time   04/05/12 10:15 PM      Component Value Range Comment   Neutrophils Relative 60  43 - 77 (%)    Neutro Abs 5.1  1.7 - 7.7 (K/uL)    Lymphocytes Relative 28  12 - 46 (%)    Lymphs Abs 2.4  0.7 - 4.0 (K/uL)    Monocytes Relative 10  3 - 12 (%)    Monocytes Absolute 0.8  0.1 - 1.0 (K/uL)    Eosinophils Relative 2  0 - 5 (%)    Eosinophils Absolute 0.1  0.0 - 0.7 (K/uL)    Basophils Relative 1  0 - 1 (%)    Basophils Absolute 0.0  0.0 - 0.1 (K/uL)   COMPREHENSIVE METABOLIC PANEL     Status: Abnormal  Collection Time   04/05/12 10:15 PM      Component Value Range Comment   Sodium 137  135 - 145 (mEq/L)    Potassium 3.6  3.5 - 5.1 (mEq/L)    Chloride 103  96 - 112 (mEq/L)    CO2 24  19 - 32 (mEq/L)    Glucose, Bld 99  70 - 99 (mg/dL)    BUN 17  6 - 23 (mg/dL)    Creatinine, Ser 4.09  0.50 - 1.35 (mg/dL)    Calcium 8.6  8.4 - 10.5 (mg/dL)    Total Protein 7.4  6.0 - 8.3 (g/dL)    Albumin 2.9 (*) 3.5 - 5.2 (g/dL)    AST 49 (*) 0 - 37 (U/L)    ALT 57 (*) 0 - 53 (U/L)    Alkaline Phosphatase 175 (*) 39 - 117 (U/L)    Total Bilirubin 0.4  0.3 - 1.2 (mg/dL)    GFR calc non Af Amer >90  >90 (mL/min)    GFR calc Af Amer >90  >90 (mL/min)   PRO B NATRIURETIC PEPTIDE     Status: Abnormal   Collection Time   04/05/12 10:15 PM      Component Value Range Comment   Pro B Natriuretic peptide (BNP) 3204.0 (*) 0 - 125 (pg/mL)   POCT I-STAT TROPONIN I     Status: Normal   Collection Time    04/05/12 10:18 PM      Component Value Range Comment   Troponin i, poc 0.06  0.00 - 0.08 (ng/mL)    Comment 3            BLOOD GAS, ARTERIAL     Status: Abnormal   Collection Time   04/06/12  1:20 AM      Component Value Range Comment   O2 Content 2.0      Delivery systems NASAL CANNULA      pH, Arterial 7.414  7.350 - 7.450     pCO2 arterial 32.2 (*) 35.0 - 45.0 (mmHg)    pO2, Arterial 72.4 (*) 80.0 - 100.0 (mmHg)    Bicarbonate 20.2  20.0 - 24.0 (mEq/L)    TCO2 17.8  0 - 100 (mmol/L)    Acid-base deficit 3.0 (*) 0.0 - 2.0 (mmol/L)    O2 Saturation 92.1      Patient temperature 98.6      Collection site RIGHT RADIAL      Drawn by [id number]      Sample type ARTERIAL DRAW      Allens test (pass/fail) PASS  PASS    URINE RAPID DRUG SCREEN (HOSP PERFORMED)     Status: Abnormal   Collection Time   04/06/12  6:09 AM      Component Value Range Comment   Opiates NONE DETECTED  NONE DETECTED     Cocaine POSITIVE (*) NONE DETECTED     Benzodiazepines NONE DETECTED  NONE DETECTED     Amphetamines NONE DETECTED  NONE DETECTED     Tetrahydrocannabinol NONE DETECTED  NONE DETECTED     Barbiturates NONE DETECTED  NONE DETECTED    CARDIAC PANEL(CRET KIN+CKTOT+MB+TROPI)     Status: Abnormal   Collection Time   04/06/12  6:33 AM      Component Value Range Comment   Total CK 99  7 - 232 (U/L)    CK, MB 4.1 (*) 0.3 - 4.0 (ng/mL)    Troponin I <0.30  <0.30 (ng/mL)    Relative Index  RELATIVE INDEX IS INVALID  0.0 - 2.5      Dg Chest 2 View  04/05/2012  *RADIOLOGY REPORT*  Clinical Data: Shortness of breath  CHEST - 2 VIEW  Comparison: 01/17/2012  Findings: Status post left pneumonectomy with mediastinal shift into the left hemithorax.  Coronary artery stent noted. Right hilar fullness is similar to prior.  Otherwise, cannot evaluate the cardiomediastinal contours. Hyperinflation with interstitial prominence on the right.  No pneumothorax on the right. 1 cm nodular density not seen previously may be  artifactual due to superimposed shadows.  Attention on follow-up.  No acute osseous finding.  IMPRESSION: Status post left pneumonectomy.  Right lung emphysema.  Interstitial prominence may reflect chronic change or pulmonary edema.  1 cm nodular density within the right lung was not seen on recent prior, therefore may be artifactual.  Attention on follow-up.  Original Report Authenticated By: Waneta Martins, M.D.   Ct Angio Chest W/cm &/or Wo Cm  04/06/2012  *RADIOLOGY REPORT*  Clinical Data: Right-sided chest pain  CT ANGIOGRAPHY CHEST  Technique:  Multidetector CT imaging of the chest using the standard protocol during bolus administration of intravenous contrast. Multiplanar reconstructed images including MIPs were obtained and reviewed to evaluate the vascular anatomy.  Contrast: OMNIPAQUE IOHEXOL 300 MG/ML  SOLN  Comparison: 08/23/2011  Findings: The evaluation of the lower lobe branches are degraded by respiratory motion.  Otherwise, no pulmonary arterial branch filling defect is identified.  Normal caliber aorta with scattered atherosclerosis.  Status post left pneumonectomy with fluid in the surgical bed.  Mediastinal structures are shifted leftward.  The previously described right perihilar lymph nodes appear more prominent on today's study.  In total, measures 2.4 x 3.7 cm as compared to 1.6 x 3.2 cm previously.  It is conceivable that the increased may be exaggerated by pleural fluid tracking along the fissure. Small right pleural effusion collects dependently as well.  Centrolobular emphysematous changes.  Interlobular septal prominence.  Peribronchial thickening detailed parenchymal evaluation is degraded by motion.  No pneumothorax.  The trachea and main bronchi are patent.  Status post left thoracotomy.  No acute osseous abnormality identified.  Limited images through the upper abdomen demonstrate no acute abnormality.  IMPRESSION: No pulmonary embolism identified.  Small right pleural  effusion.  Right hilar fullness has increased in the interval in this patient with known prominent right hilar lymph nodes. It is possible that the increase may be exaggerated by adjacent fluid along the fissure. Recommend repeat CT after resolution of the volume overload status.  Interlobular septal prominence and peribronchial thickening may reflect edema or infection.  Lymphangitic spread of tumor not entirely excluded.  Original Report Authenticated By: Waneta Martins, M.D.    Review of Systems  Constitutional: Negative for fever, chills and weight loss.  HENT: Negative for hearing loss.   Eyes: Negative for blurred vision and double vision.  Respiratory: Positive for cough and shortness of breath. Negative for hemoptysis and sputum production.   Cardiovascular: Positive for chest pain, palpitations and orthopnea. Negative for leg swelling and PND.  Gastrointestinal: Negative for heartburn, nausea and abdominal pain.  Neurological: Negative for dizziness and headaches.   Blood pressure 120/82, pulse 92, temperature 98.7 F (37.1 C), temperature source Oral, resp. rate 18, SpO2 100.00%. Physical Exam  Constitutional: He is oriented to person, place, and time.  HENT:  Head: Normocephalic and atraumatic.  Nose: Nose normal.  Mouth/Throat: No oropharyngeal exudate.  Eyes: Conjunctivae and EOM are normal. Pupils are  equal, round, and reactive to light. Left eye exhibits discharge. No scleral icterus.  Neck: Normal range of motion. Neck supple. No JVD present. No tracheal deviation present. No thyromegaly present.  Cardiovascular: Regular rhythm.        Regular rate and rhythm S1-S2 normal Nose 3/6 systolic ejection murmur in the precordium radiating to axilla  Respiratory:       Decreased breath sound at bases with faint rales  GI: Soft. Bowel sounds are normal. He exhibits no distension. There is no tenderness. There is no rebound.  Musculoskeletal: He exhibits no edema and no  tenderness.  Lymphadenopathy:    He has no cervical adenopathy.  Neurological: He is alert and oriented to person, place, and time.    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 Agree with ACE inhibitors and  rest of medicines Discussed with patient regarding left cath possible PTCA stenting its risk and benefits i.e. death MI stroke need for emergency CABG local vascular complications etc. and consented for PCI.  Harlyn Italiano N 04/06/2012, 10:11 AM

## 2012-04-06 NOTE — ED Notes (Signed)
Pt calmer and breathing much easier  Waiting for a stepdown bed

## 2012-04-07 ENCOUNTER — Ambulatory Visit (HOSPITAL_COMMUNITY): Admit: 2012-04-07 | Payer: Self-pay | Admitting: Cardiology

## 2012-04-07 ENCOUNTER — Encounter (HOSPITAL_COMMUNITY)
Admission: EM | Disposition: A | Discharge status: Home / Self Care | Payer: Self-pay | Source: Home / Self Care | Attending: Internal Medicine

## 2012-04-07 DIAGNOSIS — I509 Heart failure, unspecified: Secondary | ICD-10-CM

## 2012-04-07 DIAGNOSIS — I2510 Atherosclerotic heart disease of native coronary artery without angina pectoris: Secondary | ICD-10-CM

## 2012-04-07 DIAGNOSIS — F1910 Other psychoactive substance abuse, uncomplicated: Secondary | ICD-10-CM

## 2012-04-07 HISTORY — PX: LEFT HEART CATHETERIZATION WITH CORONARY ANGIOGRAM: SHX5451

## 2012-04-07 LAB — CBC
MCH: 28.8 pg (ref 26.0–34.0)
MCHC: 33.9 g/dL (ref 30.0–36.0)
MCV: 84.9 fL (ref 78.0–100.0)
Platelets: 289 10*3/uL (ref 150–400)

## 2012-04-07 LAB — COMPREHENSIVE METABOLIC PANEL
ALT: 44 U/L (ref 0–53)
AST: 36 U/L (ref 0–37)
Albumin: 2.9 g/dL — ABNORMAL LOW (ref 3.5–5.2)
Alkaline Phosphatase: 146 U/L — ABNORMAL HIGH (ref 39–117)
CO2: 26 mEq/L (ref 19–32)
Chloride: 98 mEq/L (ref 96–112)
GFR calc non Af Amer: 72 mL/min — ABNORMAL LOW (ref >90)
Potassium: 3.6 mEq/L (ref 3.5–5.1)
Sodium: 136 mEq/L (ref 135–145)
Total Bilirubin: 0.4 mg/dL (ref 0.3–1.2)

## 2012-04-07 LAB — BASIC METABOLIC PANEL
BUN: 19 mg/dL (ref 6–23)
Calcium: 8.3 mg/dL — ABNORMAL LOW (ref 8.4–10.5)
GFR calc non Af Amer: 78 mL/min — ABNORMAL LOW (ref >90)
Glucose, Bld: 91 mg/dL (ref 70–99)
Sodium: 136 mEq/L (ref 135–145)

## 2012-04-07 LAB — TSH: TSH: 3.265 u[IU]/mL (ref 0.350–4.500)

## 2012-04-07 SURGERY — LEFT HEART CATHETERIZATION WITH CORONARY ANGIOGRAM
Anesthesia: LOCAL

## 2012-04-07 MED ORDER — HEPARIN (PORCINE) IN NACL 2-0.9 UNIT/ML-% IJ SOLN
INTRAMUSCULAR | Status: AC
  Filled 2012-04-07: qty 2000

## 2012-04-07 MED ORDER — ONDANSETRON HCL 4 MG/2ML IJ SOLN: 4.0000 mg | Freq: Four times a day (QID) | INTRAMUSCULAR | Status: DC | PRN

## 2012-04-07 MED ORDER — PRASUGREL HCL 10 MG PO TABS
ORAL_TABLET | ORAL | Status: AC
  Filled 2012-04-07: qty 6

## 2012-04-07 MED ORDER — SODIUM CHLORIDE 0.9 % IJ SOLN: 3.0000 mL | INTRAMUSCULAR | Status: DC | PRN

## 2012-04-07 MED ORDER — NITROGLYCERIN 0.2 MG/ML ON CALL CATH LAB
INTRAVENOUS | Status: AC
  Filled 2012-04-07: qty 1

## 2012-04-07 MED ORDER — ASPIRIN 81 MG PO CHEW
324.0000 mg | CHEWABLE_TABLET | ORAL | Status: AC
  Administered 2012-04-07: 324 mg via ORAL
  Filled 2012-04-07: qty 4

## 2012-04-07 MED ORDER — SODIUM CHLORIDE 0.9 % IV SOLN: INTRAVENOUS | Status: DC

## 2012-04-07 MED ORDER — SODIUM CHLORIDE 0.9 % IV SOLN: 250.0000 mL | INTRAVENOUS | Status: DC | PRN

## 2012-04-07 MED ORDER — DIAZEPAM 5 MG PO TABS
5.0000 mg | ORAL_TABLET | ORAL | Status: AC
  Administered 2012-04-07: 5 mg via ORAL
  Filled 2012-04-07: qty 1

## 2012-04-07 MED ORDER — SODIUM CHLORIDE 0.9 % IV BOLUS (SEPSIS): 250.0000 mL | Freq: Once | INTRAVENOUS | Status: DC

## 2012-04-07 MED ORDER — ASPIRIN EC 325 MG PO TBEC: 325.0000 mg | DELAYED_RELEASE_TABLET | Freq: Every day | ORAL | Status: DC

## 2012-04-07 MED ORDER — PRASUGREL HCL 10 MG PO TABS
10.0000 mg | ORAL_TABLET | Freq: Every day | ORAL | Status: DC
  Administered 2012-04-08: 10 mg via ORAL
  Filled 2012-04-07 (×2): qty 1

## 2012-04-07 MED ORDER — ATORVASTATIN CALCIUM 80 MG PO TABS
80.0000 mg | ORAL_TABLET | Freq: Every day | ORAL | Status: DC
  Administered 2012-04-07: 80 mg via ORAL
  Filled 2012-04-07 (×2): qty 1

## 2012-04-07 MED ORDER — NITROGLYCERIN IN D5W 200-5 MCG/ML-% IV SOLN: 5.0000 ug/min | INTRAVENOUS | Status: DC

## 2012-04-07 MED ORDER — RAMIPRIL 1.25 MG PO CAPS
1.2500 mg | ORAL_CAPSULE | Freq: Every day | ORAL | Status: DC
  Filled 2012-04-07 (×2): qty 1

## 2012-04-07 MED ORDER — LIDOCAINE HCL (PF) 1 % IJ SOLN
INTRAMUSCULAR | Status: AC
  Filled 2012-04-07: qty 30

## 2012-04-07 MED ORDER — ASPIRIN 325 MG PO TABS
325.0000 mg | ORAL_TABLET | Freq: Every day | ORAL | Status: DC
  Administered 2012-04-08: 325 mg via ORAL
  Filled 2012-04-07 (×2): qty 1

## 2012-04-07 MED ORDER — ACETAMINOPHEN 325 MG PO TABS: 650.0000 mg | ORAL_TABLET | ORAL | Status: DC | PRN

## 2012-04-07 MED ORDER — CARVEDILOL 3.125 MG PO TABS
3.1250 mg | ORAL_TABLET | Freq: Two times a day (BID) | ORAL | Status: DC
  Administered 2012-04-08: 3.125 mg via ORAL
  Filled 2012-04-07 (×2): qty 1

## 2012-04-07 MED ORDER — AZITHROMYCIN 500 MG PO TABS
500.0000 mg | ORAL_TABLET | Freq: Every day | ORAL | Status: AC
  Filled 2012-04-07: qty 1

## 2012-04-07 MED ORDER — SODIUM CHLORIDE 0.9 % IV SOLN: INTRAVENOUS | Status: AC

## 2012-04-07 MED ORDER — SODIUM CHLORIDE 0.9 % IJ SOLN
3.0000 mL | Freq: Two times a day (BID) | INTRAMUSCULAR | Status: DC
  Administered 2012-04-07: 3 mL via INTRAVENOUS

## 2012-04-07 MED ORDER — AZITHROMYCIN 250 MG PO TABS
250.0000 mg | ORAL_TABLET | Freq: Every day | ORAL | Status: DC
  Administered 2012-04-08: 250 mg via ORAL
  Filled 2012-04-07 (×2): qty 1

## 2012-04-07 MED ORDER — BIVALIRUDIN 250 MG IV SOLR
INTRAVENOUS | Status: AC
  Filled 2012-04-07: qty 250

## 2012-04-07 MED ORDER — ACETAMINOPHEN 325 MG PO TABS
650.0000 mg | ORAL_TABLET | ORAL | Status: DC | PRN
  Administered 2012-04-07 – 2012-04-08 (×2): 650 mg via ORAL
  Filled 2012-04-07: qty 2

## 2012-04-07 NOTE — Progress Notes (Signed)
Subjective: Patient complains of coughing with some congestion.  Objective: Weight change:   Intake/Output Summary (Last 24 hours) at 04/07/12 1907 Last data filed at 04/07/12 1844  Gross per 24 hour  Intake    887 ml  Output   1100 ml  Net   -213 ml    Filed Vitals:   04/07/12 1800  BP: 92/66  Pulse: 77  Temp:   Resp: 21  General: Patient does not seem to be in any acute distress HEENT: Head is normocephalic atraumatic Cardiovascular: Regular rate rhythm Lungs: Rhonchi throughout Abdomen: Positive bowel sounds Extremities no edema  Lab Results: Reviewed  Studies/Results: Dg Chest 2 View  04/05/2012  *RADIOLOGY REPORT*  Clinical Data: Shortness of breath  CHEST - 2 VIEW  Comparison: 01/17/2012  Findings: Status post left pneumonectomy with mediastinal shift into the left hemithorax.  Coronary artery stent noted. Right hilar fullness is similar to prior.  Otherwise, cannot evaluate the cardiomediastinal contours. Hyperinflation with interstitial prominence on the right.  No pneumothorax on the right. 1 cm nodular density not seen previously may be artifactual due to superimposed shadows.  Attention on follow-up.  No acute osseous finding.  IMPRESSION: Status post left pneumonectomy.  Right lung emphysema.  Interstitial prominence may reflect chronic change or pulmonary edema.  1 cm nodular density within the right lung was not seen on recent prior, therefore may be artifactual.  Attention on follow-up.  Original Report Authenticated By: Waneta Martins, M.D.   Ct Angio Chest W/cm &/or Wo Cm  04/06/2012  *RADIOLOGY REPORT*  Clinical Data: Right-sided chest pain  CT ANGIOGRAPHY CHEST  Technique:  Multidetector CT imaging of the chest using the standard protocol during bolus administration of intravenous contrast. Multiplanar reconstructed images including MIPs were obtained and reviewed to evaluate the vascular anatomy.  Contrast: OMNIPAQUE IOHEXOL 300 MG/ML  SOLN   Comparison: 08/23/2011  Findings: The evaluation of the lower lobe branches are degraded by respiratory motion.  Otherwise, no pulmonary arterial branch filling defect is identified.  Normal caliber aorta with scattered atherosclerosis.  Status post left pneumonectomy with fluid in the surgical bed.  Mediastinal structures are shifted leftward.  The previously described right perihilar lymph nodes appear more prominent on today's study.  In total, measures 2.4 x 3.7 cm as compared to 1.6 x 3.2 cm previously.  It is conceivable that the increased may be exaggerated by pleural fluid tracking along the fissure. Small right pleural effusion collects dependently as well.  Centrolobular emphysematous changes.  Interlobular septal prominence.  Peribronchial thickening detailed parenchymal evaluation is degraded by motion.  No pneumothorax.  The trachea and main bronchi are patent.  Status post left thoracotomy.  No acute osseous abnormality identified.  Limited images through the upper abdomen demonstrate no acute abnormality.  IMPRESSION: No pulmonary embolism identified.  Small right pleural effusion.  Right hilar fullness has increased in the interval in this patient with known prominent right hilar lymph nodes. It is possible that the increase may be exaggerated by adjacent fluid along the fissure. Recommend repeat CT after resolution of the volume overload status.  Interlobular septal prominence and peribronchial thickening may reflect edema or infection.  Lymphangitic spread of tumor not entirely excluded.  Original Report Authenticated By: Waneta Martins, M.D.   Medications: Scheduled Meds:   . amitriptyline  25 mg Oral QHS  . aspirin  324 mg Oral Pre-Cath  . aspirin  325 mg Oral Daily  . atorvastatin  80 mg Oral q1800  .  azithromycin  500 mg Oral Daily   Followed by  . azithromycin  250 mg Oral Daily  . bivalirudin      . carvedilol  3.125 mg Oral BID WC  . diazepam  5 mg Oral On Call  . heparin       . lidocaine      . nitroGLYCERIN      . prasugrel      . prasugrel  10 mg Oral Daily  . ramipril  1.25 mg Oral Daily  . sodium chloride  250 mL Intravenous Once  . sodium chloride  3 mL Intravenous Q12H  . DISCONTD: aspirin EC  325 mg Oral Daily  . DISCONTD: aspirin  325 mg Oral Daily  . DISCONTD: clopidogrel  75 mg Oral Daily  . DISCONTD: diltiazem  30 mg Oral QID  . DISCONTD: enoxaparin  40 mg Subcutaneous Q24H  . DISCONTD: furosemide  40 mg Intravenous Q8H  . DISCONTD: nitroGLYCERIN  0.4 mg Transdermal Daily  . DISCONTD: sodium chloride  250 mL Intravenous Once  . DISCONTD: sodium chloride  3 mL Intravenous Q12H   Continuous Infusions:   . sodium chloride    . nitroGLYCERIN    . DISCONTD: sodium chloride    . DISCONTD: nitroGLYCERIN     PRN Meds:.acetaminophen, ondansetron (ZOFRAN) IV, oxyCODONE-acetaminophen, zolpidem, DISCONTD: sodium chloride, DISCONTD: acetaminophen, DISCONTD: acetaminophen, DISCONTD: acetaminophen, DISCONTD: ondansetron (ZOFRAN) IV, DISCONTD: ondansetron, DISCONTD: sodium chloride  Assessment/Plan: CHF (congestive heart failure) (5/23/201/CAD (coronary artery disease) (04/06/2012) Patient had cardiac catheterization today with stenting per cardiology Dr Sharyn Lull. Possible discharge tomorrow.  Lung cancer (04/06/2012) Stable   Cocaine abuse (04/06/2012)  constipation on substance abuse.  Acute bronchitis Z-Pak   LOS: 2 days   Ronald [name] 04/07/2012, 7:07 PM

## 2012-04-07 NOTE — Progress Notes (Signed)
Pt is down from 1 ppd to 4 cigarettes per day. He is in action stage and plans to quit cold [place] on his own. Referred to 1-800 quit now for f/u and support. Discussed oral fixation substitutes, second hand smoke and in home smoking policy. Reviewed and gave pt Written education/contact information.

## 2012-04-07 NOTE — Progress Notes (Signed)
Utilization Review Completed.Ronald [name] T5/24/2013   

## 2012-04-07 NOTE — Progress Notes (Signed)
Spoke with K. Schorr and Dr. Sharyn Lull in regards to low BP and lasix administration. Both agreed to hold am Lasix dose. Dr. Sharyn Lull aware of the held dose and NS bolus last night as well. Parameters added to cardizem and nitropaste as per Dr. Annitta Jersey orders. Pt is NPO for Cardiac Cath today---Dr. Sharyn Lull states it will probably occur around 1100am. Will continue to monitor and notify day shift.

## 2012-04-07 NOTE — ED Provider Notes (Signed)
Medical screening examination/treatment/procedure(s) were performed by non-physician practitioner and as supervising physician I was immediately available for consultation/collaboration.  Juliet Rude. Rubin Payor, MD 04/07/12 [phone]

## 2012-04-07 NOTE — Care Management Note (Signed)
    Page 1 of 1   04/07/2012     1:32:36 PM   CARE MANAGEMENT NOTE 04/07/2012  Patient:  VI, WHITESEL   Account Number:  [id number]  Date Initiated:  04/07/2012  Documentation initiated by:  Lanier Clam  Subjective/Objective Assessment:   ADMITTED W/SOB.CHF.AV:WUJW CA,CAD.     Action/Plan:   FROM HOME ALONE.   Anticipated DC Date:  04/07/2012   Anticipated DC Plan:  ACUTE TO ACUTE TRANS      DC Planning Services  CM consult      Choice offered to / List presented to:             Status of service:  Completed, signed off Medicare Important Message given?   (If response is "NO", the following Medicare IM given date fields will be blank) Date Medicare IM given:   Date Additional Medicare IM given:    Discharge Disposition:  ACUTE TO ACUTE TRANS  Per UR Regulation:  Reviewed for med. necessity/level of care/duration of stay  If discussed at Long Length of Stay Meetings, dates discussed:    Comments:  04/07/12 Jaquel Glassburn RN,BSN NCM [phone] TRANSFERRED TO [facility]-CATH. CARDIO/PULMONARY FOLLOWING.CARDIAC CATH TODAY.

## 2012-04-07 NOTE — Cardiovascular Report (Signed)
Ronald [name], Ronald [name]             ACCOUNT NO.:  [id number]  MEDICAL RECORD NO.:  [id number]  LOCATION:  2503                         FACILITY:  [facility]  PHYSICIAN:  Eduardo Osier. Sharyn Lull, M.D. DATE OF BIRTH:  Feb 10, 1956  DATE OF PROCEDURE:  04/07/2012 DATE OF DISCHARGE:                           CARDIAC CATHETERIZATION   PROCEDURE: 1. Left cardiac cath with selective left and right coronary     angiography, LV graphy via right groin using Judkins technique. 2. Successful PTCA to proximal ramus using 2.5 x 8 mm long Emerge     balloon for predilatation. 3. Successful deployment of 2.5 x 16 mm long PROMUS Element drug-     eluting stent in proximal ramus. 4. Successful PTCA to mid left circumflex using same 2.5 x 8 mm Long     Emerge balloon. 5. Successful deployment of a 2.25 x 12 mm long PROMUS Element drug-     eluting stent in mid left circumflex.  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  He came to the ER complaining of progressive increasing shortness of breath for last 3 days, associated with left- sided precordial chest pain radiating to left arm off and on, associated with palpitation.  The patient states he has been smoking cocaine for 3- 4 times per week regularly and now smokes few cigarettes and he used to smoke more than 1 pack per day for 20+ years.  The patient had nuclear stress test in February 2013, which showed mild peri-infarct ischemia in the anteroseptal wall.  The patient opted for medical management and  did not follow up in the office.  Due to recurrent chest pain and multiple risk factors and new onset heart failure, I discussed with the patient regarding left cath, possible PTCA with stenting, its risks and benefits, i.e., death, MI, stroke, need for emergency CABG, local vascular complications, etc. and consented for the procedure.  PROCEDURE:  After obtaining the informed consent, the patient was brought to the cath lab and was placed on fluoroscopy table.  Right groin was prepped and draped in usual fashion.  Xylocaine 1% was used for local anesthesia in the right groin.  With the help of thin wall needle, a 6-French arterial sheath was placed.  The sheath was aspirated and flushed.  A 6-French left Judkins catheter was advanced over the wire under fluoroscopic guidance up to the ascending aorta.  Wire was pulled out. The catheter was aspirated and connected to the Manifold.  Catheter was further advanced and engaged into left coronary ostium.  Multiple views of the left system were taken.  The catheter was disengaged and was pulled out over the wire and was replaced with a 6-French right Judkins catheter, which was advanced over the wire under fluoroscopic guidance up to the ascending aorta.  Wire was pulled out.  The  catheter was aspirated and connected to the Manifold.  The catheter was further advanced and engaged into right coronary ostium.  Multiple views of the right system were taken.  The catheter was disengaged and was pulled out over the wire and was replaced with a 6-French pigtail catheter, which was advanced over the wire under fluoroscopic guidance up to the ascending aorta.  Wire was pulled out.  The catheter was aspirated and connected to the Manifold. The catheter was further advanced across the aortic valve into the LV. LV pressures were recorded.  LV graft was done in 30-degree RAO position.  Post-angiographic pressures were recorded from LV and then  pullback pressures were recorded from the aorta.  There was no gradient across the aortic valve.  The pigtail catheter was pulled out over the wire.  Sheaths were aspirated and flushed.  FINDINGS:  The LV showed global hypokinesia, EF of 20-25%.  There was 4+ MR.  Left main was patent.  LAD has 30-40% proximal in-stent restenosis in the mid portion of the stent.  Diagonal 1, 2, 3 are very small. Ramus has 95% proximal stenosis.  Left circumflex is small, which has 75- 80% mid stenosis.  OM 1 is very, very small.  RCA is patent, which is large.  PDA and PLV branches are also patent.  INTERVENTIONAL PROCEDURE:  Successful PTCA to proximal ramus was done using 2.5 x 8 mm long Emerge balloon for predilatation and then 2.5 x 16 mm long PROMUS Element drug-eluting stent was deployed at 11 atmospheric pressure.  Stent was postdilated going up to 13 atmospheric pressure. Lesion was dilated from 95% to 0% residual with excellent TIMI grade 3 distal flow without evidence of dissection or distal embolization and then, successful PTCA to mid left circumflex was done using same 2.5 x 8 mm long Emerge balloon going up to very low-pressure up to 4 atmospheric pressure and then 2.25 x 12 mm long PROMUS Element Plus drug-eluting stent was deployed in mid left circumflex at 9 atmospheric pressure. Stent was postdilated going up to 13 atmospheric pressure.  Lesion was dilated from 75 to 80% to 0% residual with excellent TIMI grade 3 distal flow without evidence of dissection or distal embolization.  The patient received weight-based Angiomax and 60 mg of prasugrel prior to the procedure.  The patient tolerated procedure well.  There were no complications.  Arteriotomy at the end of the procedure was closed with Perclose without difficulty.  The patient tolerated procedure well. There were no complications.  The patient was transferred to recovery room in stable condition.     Eduardo Osier. Sharyn Lull,  M.D.     [facility]/MEDQ  D:  04/07/2012  T:  04/07/2012  Job:  [id number]

## 2012-04-07 NOTE — Progress Notes (Signed)
Subjective:  Doing well no chest pain breathing has improved. Successfully underwent left cath and PTCA stenting to left circumflex and ramus.  Objective:  Vital Signs in the last 24 hours: Temp:  [97.4 F (36.3 C)-97.9 F (36.6 C)] 97.9 F (36.6 C) (05/24 1700) Pulse Rate:  [69-81] 77  (05/24 1800) Resp:  [16-23] 21  (05/24 1800) BP: (87-102)/(53-73) 92/66 mmHg (05/24 1800) SpO2:  [90 %-98 %] 96 % (05/24 1800) Weight:  [63.458 kg (139 lb 14.4 oz)] 63.458 kg (139 lb 14.4 oz) (05/24 0504)  Intake/Output from previous day: 05/23 0701 - 05/24 0700 In: 610 [P.O.:600; I.V.:6; IV Piggyback:4] Out: 2675 [Urine:2675] Intake/Output from this shift: Total I/O In: 760 [P.O.:760] Out: 800 [Urine:800]  Physical Exam: Neck: no adenopathy, no carotid bruit, no JVD and supple, symmetrical, trachea midline Lungs: decreased breath sound at bases with occasional ralesair entry improved Heart: regular rate and rhythm, S1, S2 normal, S2: 3/6 systolic murmur and soft diastolic murmur noted and no rub Abdomen: soft, non-tender; bowel sounds normal; no masses,  no organomegaly Extremities: extremities normal, atraumatic, no cyanosis or edema and right groin stable  Lab Results:  Basename 04/07/12 0443 04/05/12 2215  WBC 7.2 8.4  HGB 13.0 13.3  PLT 289 304    Basename 04/07/12 0630 04/07/12 0443  NA 136 136  K 3.5 3.6  CL 100 98  CO2 27 26  GLUCOSE 91 92  BUN 19 20  CREATININE 1.05 1.12    Basename 04/06/12 2235 04/06/12 1428  TROPONINI <0.30 <0.30   Hepatic Function Panel  Basename 04/07/12 0443  PROT 7.0  ALBUMIN 2.9*  AST 36  ALT 44  ALKPHOS 146*  BILITOT 0.4  BILIDIR --  IBILI --   No results found for this basename: CHOL in the last 72 hours No results found for this basename: PROTIME in the last 72 hours  Imaging: Imaging results have been reviewed and Dg Chest 2 View  04/05/2012  *RADIOLOGY REPORT*  Clinical Data: Shortness of breath  CHEST - 2 VIEW  Comparison:  01/17/2012  Findings: Status post left pneumonectomy with mediastinal shift into the left hemithorax.  Coronary artery stent noted. Right hilar fullness is similar to prior.  Otherwise, cannot evaluate the cardiomediastinal contours. Hyperinflation with interstitial prominence on the right.  No pneumothorax on the right. 1 cm nodular density not seen previously may be artifactual due to superimposed shadows.  Attention on follow-up.  No acute osseous finding.  IMPRESSION: Status post left pneumonectomy.  Right lung emphysema.  Interstitial prominence may reflect chronic change or pulmonary edema.  1 cm nodular density within the right lung was not seen on recent prior, therefore may be artifactual.  Attention on follow-up.  Original Report Authenticated By: Waneta Martins, M.D.   Ct Angio Chest W/cm &/or Wo Cm  04/06/2012  *RADIOLOGY REPORT*  Clinical Data: Right-sided chest pain  CT ANGIOGRAPHY CHEST  Technique:  Multidetector CT imaging of the chest using the standard protocol during bolus administration of intravenous contrast. Multiplanar reconstructed images including MIPs were obtained and reviewed to evaluate the vascular anatomy.  Contrast: OMNIPAQUE IOHEXOL 300 MG/ML  SOLN  Comparison: 08/23/2011  Findings: The evaluation of the lower lobe branches are degraded by respiratory motion.  Otherwise, no pulmonary arterial branch filling defect is identified.  Normal caliber aorta with scattered atherosclerosis.  Status post left pneumonectomy with fluid in the surgical bed.  Mediastinal structures are shifted leftward.  The previously described right perihilar lymph nodes appear  more prominent on today's study.  In total, measures 2.4 x 3.7 cm as compared to 1.6 x 3.2 cm previously.  It is conceivable that the increased may be exaggerated by pleural fluid tracking along the fissure. Small right pleural effusion collects dependently as well.  Centrolobular emphysematous changes.  Interlobular septal  prominence.  Peribronchial thickening detailed parenchymal evaluation is degraded by motion.  No pneumothorax.  The trachea and main bronchi are patent.  Status post left thoracotomy.  No acute osseous abnormality identified.  Limited images through the upper abdomen demonstrate no acute abnormality.  IMPRESSION: No pulmonary embolism identified.  Small right pleural effusion.  Right hilar fullness has increased in the interval in this patient with known prominent right hilar lymph nodes. It is possible that the increase may be exaggerated by adjacent fluid along the fissure. Recommend repeat CT after resolution of the volume overload status.  Interlobular septal prominence and peribronchial thickening may reflect edema or infection.  Lymphangitic spread of tumor not entirely excluded.  Original Report Authenticated By: Waneta Martins, M.D.    Cardiac Studies:  Assessment/Plan:  Status post chest pain in the setting of cocaine abuse rule out vasospasm rule out MI  Multivessel coronary artery disease status post PCI to LAD in past  Status post left cath and PTCA stenting to proximal ramus and mid left circumflex Ischemic cardiomyopathy Decompensated systolic heart failure improved Moderately severe MR  Hypertension  Hypercholesteremia  Cocaine abuse  Tobacco abuse  Positive family history of coronary artery disease  history of non-small cell CA of left lung status post left pneumonectomy/right hilar adenopathy Plan Continue present management Smoking and drug cessation consult Phase I cardiac rehabilitation  LOS: 2 days    Ronald [name] 04/07/2012, 6:55 PM

## 2012-04-07 NOTE — Plan of Care (Signed)
Problem: Phase II Progression Outcomes Goal: Pain controlled Outcome: Completed/Met Date Met:  04/07/12 Pain controlled with tylenol  Goal: Vascular site scale level 0 - I Vascular Site Scale Level 0: No bruising/bleeding/hematoma Level I (Mild): Bruising/Ecchymosis, minimal bleeding/ooozing, palpable hematoma < 3 cm Level II (Moderate): Bleeding not affecting hemodynamic parameters, pseudoaneurysm, palpable hematoma > 3 cm Outcome: Completed/Met Date Met:  04/07/12 Level 0 right groin Goal: No post PCI angina Outcome: Completed/Met Date Met:  04/07/12 No angina Goal: Distal pulses equal baseline assessment Outcome: Completed/Met Date Met:  04/07/12 Pulses +1 bilaterally radials and pedals Goal: Tolerating diet Outcome: Completed/Met Date Met:  04/07/12 Tolerating diet well

## 2012-04-07 NOTE — CV Procedure (Signed)
Left cardiac cath/PTCA stenting report dictated on 04/07/2012 dictation number is [id number]

## 2012-04-08 ENCOUNTER — Inpatient Hospital Stay (HOSPITAL_COMMUNITY): Payer: Medicare Other

## 2012-04-08 DIAGNOSIS — I5021 Acute systolic (congestive) heart failure: Secondary | ICD-10-CM | POA: Diagnosis present

## 2012-04-08 DIAGNOSIS — Z720 Tobacco use: Secondary | ICD-10-CM | POA: Diagnosis present

## 2012-04-08 DIAGNOSIS — I25810 Atherosclerosis of coronary artery bypass graft(s) without angina pectoris: Secondary | ICD-10-CM

## 2012-04-08 DIAGNOSIS — J209 Acute bronchitis, unspecified: Secondary | ICD-10-CM | POA: Diagnosis present

## 2012-04-08 DIAGNOSIS — F1910 Other psychoactive substance abuse, uncomplicated: Secondary | ICD-10-CM

## 2012-04-08 DIAGNOSIS — Z9889 Other specified postprocedural states: Secondary | ICD-10-CM

## 2012-04-08 LAB — CBC
Hemoglobin: 13.4 g/dL (ref 13.0–17.0)
MCH: 29.5 pg (ref 26.0–34.0)
MCHC: 34.9 g/dL (ref 30.0–36.0)
MCV: 84.6 fL (ref 78.0–100.0)
Platelets: 288 10*3/uL (ref 150–400)

## 2012-04-08 LAB — BASIC METABOLIC PANEL
Calcium: 8.5 mg/dL (ref 8.4–10.5)
Creatinine, Ser: 0.85 mg/dL (ref 0.50–1.35)
GFR calc non Af Amer: >90 mL/min (ref >90)
Glucose, Bld: 87 mg/dL (ref 70–99)
Sodium: 136 mEq/L (ref 135–145)

## 2012-04-08 MED ORDER — ALBUTEROL SULFATE (5 MG/ML) 0.5% IN NEBU
2.5000 mg | INHALATION_SOLUTION | RESPIRATORY_TRACT | Status: DC | PRN
  Filled 2012-04-08: qty 0.5

## 2012-04-08 MED ORDER — RAMIPRIL 1.25 MG PO CAPS: 1.2500 mg | ORAL_CAPSULE | Freq: Every day | ORAL | Status: DC

## 2012-04-08 MED ORDER — AZITHROMYCIN 250 MG PO TABS: ORAL_TABLET | ORAL | Status: AC

## 2012-04-08 MED ORDER — GUAIFENESIN-DM 100-10 MG/5ML PO SYRP: 5.0000 mL | ORAL_SOLUTION | Freq: Three times a day (TID) | ORAL | Status: AC | PRN

## 2012-04-08 MED ORDER — PRASUGREL HCL 10 MG PO TABS: 10.0000 mg | ORAL_TABLET | Freq: Every day | ORAL | Status: DC

## 2012-04-08 NOTE — Discharge Instructions (Signed)
Heart Failure Heart failure (HF) is a condition in which the heart has trouble pumping blood. This means your heart does not pump blood efficiently for your body to work well. In some cases of HF, fluid may back up into your lungs or you may have swelling (edema) in your lower legs. HF is a long-term (chronic) condition. It is important for you to take good care of yourself and follow your caregiver's treatment plan. CAUSES   Health conditions:   High blood pressure (hypertension) causes the heart muscle to work harder than normal. When pressure in the blood vessels is high, the heart needs to pump (contract) with more force in order to circulate blood throughout the body. High blood pressure eventually causes the heart to become stiff and weak.   Coronary artery disease (CAD) is the buildup of cholesterol and fat (plaques) in the arteries of the heart. The blockage in the arteries deprives the heart muscle of oxygen and blood. This can cause chest pain and may lead to a heart attack. High blood pressure can also contribute to CAD.   Heart attack (myocardial infarction) occurs when 1 or more arteries in the heart become blocked. The loss of oxygen damages the muscle tissue of the heart. When this happens, part of the heart muscle dies. The injured tissue does not contract as well and weakens the heart's ability to pump blood.   Abnormal heart valves can cause HF when the heart valves do not open and close properly. This makes the heart muscle pump harder to keep the blood flowing.   Heart muscle disease (cardiomyopathy or myocarditis) is damage to the heart muscle from a variety of causes. These can include drug or alcohol abuse, infections, or unknown reasons. These can increase the risk of HF.   Lung disease makes the heart work harder because the lungs do not work properly. This can cause a strain on the heart leading it to fail.   Diabetes increases the risk of HF. High blood sugar contributes  to high fat (lipid) levels in the blood. Diabetes can also cause slow damage to tiny blood vessels that carry important nutrients to the heart muscle. When the heart does not get enough oxygen and food, it can cause the heart to become weak and stiff. This leads to a heart that does not contract efficiently.   Other diseases can contribute to HF. These include abnormal heart rhythms, thyroid problems, and low blood counts (anemia).   Unhealthy lifestyle habits:   Obesity.   Smoking.   Eating foods high in fat and cholesterol.   Eating or drinking beverages high in salt.   Drug or alcohol abuse.   Lack of exercise.  SYMPTOMS  HF symptoms may vary and can be hard to detect. Symptoms may include:  Shortness of breath with activity, such as climbing stairs.   Persistent cough.   Swelling of the feet, ankles, legs, or abdomen.   Unexplained weight gain.   Difficulty breathing when lying flat.   Waking from sleep because of the need to sit up and get more air.   Rapid heartbeat.   Fatigue and loss of energy.   Feeling lightheaded or close to fainting.  DIAGNOSIS  A diagnosis of HF is based on your history, symptoms, physical examination, and diagnostic tests. Diagnostic tests for HF may include:  EKG.   Chest X-ray.   Blood tests.   Exercise stress test.   Blood oxygen test (arterial blood gas).   Evaluation   by a heart doctor (cardiologist).   Ultrasound evaluation of the heart (echocardiogram).   Heart artery test to look for blockages (angiogram).   Radioactive imaging to look at the heart (radionuclide test).  TREATMENT  Treatment is aimed at managing the symptoms of HF. Medicines, lifestyle changes, or surgical intervention may be necessary to treat HF.  Medicines to help treat HF may include:   Angiotensin-converting enzyme (ACE) inhibitors. These block the effects of a blood protein called angiotensin-converting enzyme. ACE inhibitors relax (dilate) the  blood vessels and help lower blood pressure. This decreases the workload of the heart, slows the progression of HF, and improves symptoms.   Angiotensin receptor blockers (ARBs). These medications work similar to ACE inhibitors. ARBs may be an alternative for people who cannot tolerate an ACE inhibitor.   Aldosterone antagonists. This medication helps get rid of extra fluid from your body. This lowers the volume of blood the heart has to pump.   Water pills (diuretics). Diuretics cause the kidneys to remove salt and water from the blood. The extra fluid is removed by urination. By removing extra fluid from the body, diuretics help lower the workload of the heart and help prevent fluid buildup in the lungs so breathing is easier.   Beta blockers. These prevent the heart from beating too fast and improve heart muscle strength. Beta blockers help maintain a normal heart rate, control blood pressure, and improve HF symptoms.   Digitalis. This increases the force of the heartbeat and may be helpful to people with HF or heart rhythm problems.   Healthy lifestyle changes include:   Stopping smoking.   Eating a healthy diet. Avoid foods high in fat. Avoid foods fried in oil or made with fat. A dietician can help with healthy food choices.   Limiting how much salt you eat.   Limiting alcohol intake to no more than 1 drink per day for women and 2 drinks per day for men. Drinking more than that is harmful to your heart. If your heart has already been damaged by alcohol or you have severe HF, drinking alcohol should be stopped completely.   Exercising as directed by your caregiver.   Surgical treatment for HF may include:   Procedures to open blocked arteries, repair damaged heart valves, or remove damaged heart muscle tissue.   A pacemaker to help heart muscle function and to control certain abnormal heart rhythms.   A defibrillator to possibly prevent sudden cardiac death.  HOME CARE  INSTRUCTIONS   Activity level. Your caregiver can help you determine what type of exercise program may be helpful. It is important to maintain your strength. Pace your physical activity to avoid shortness of breath or chest pain. Rest for 1 hour before and after meals. A cardiac rehabilitation program may be helpful to some people with HF.   Diet. Eat a heart healthy diet. Food choices should be low in saturated fat and cholesterol. Talk to a dietician to learn about heart healthy foods.   Salt intake. When you have HF, you need to limit the amount of salt you eat. Eat less than 1500 milligrams (mg) of salt per day or as recommended by your caregiver.   Weight monitoring. Weigh yourself every day. You should weigh yourself in the morning after you urinate and before you eat breakfast. Wear the same amount of clothing each time you weigh yourself. Record your weight daily. Bring your recorded weights to your clinic visits. Tell your caregiver right away if   you have gained 3 lb/1.4 kg in 1 day, or 5 lb/2.3 kg in a week or whatever amount you were told to report.   Blood pressure monitoring. This should be done as directed by your caregiver. A home blood pressure cuff can be purchased at a drugstore. Record your blood pressure numbers and bring them to your clinic visits. Tell your caregiver if you become dizzy or lightheaded upon standing up.   Smoking. If you are currently a smoker, it is time to quit. Nicotine makes your heart work harder by causing your blood vessels to constrict. Do not use nicotine gum or patches before talking to your caregiver.   Follow up. Be sure to schedule a follow-up visit with your caregiver. Keep all your appointments.  SEEK MEDICAL CARE IF:   Your weight increases by 3 lb/1.4 kg in 1 day or 5 lb/2.3 kg in a week.   You notice increasing shortness of breath that is unusual for you. This may happen during rest, sleep, or with activity.   You cough more than normal,  especially with physical activity.   You notice more swelling in your hands, feet, ankles, or belly (abdomen).   You are unable to sleep because it is hard to breathe.   You cough up bloody mucus (sputum).   You begin to feel "jumping" or "fluttering" sensations (palpitations) in your chest.  SEEK IMMEDIATE MEDICAL CARE IF:   You have severe chest pain or pressure which may include symptoms such as:   Pain or pressure in the arms, neck, jaw, or back.   Feeling sweaty.   Feeling sick to your stomach (nauseous).   Feeling short of breath while at rest.   Having a fast or irregular heartbeat.   You experience stroke symptoms. These symptoms include:   Facial weakness or numbness.   Weakness or numbness in an arm, leg, or on one side of your body.   Blurred vision.   Difficulty talking or thinking.   Dizziness or fainting.   Severe headache.  THESE ARE MEDICAL EMERGENCIES. Do not wait to see if the symptoms go away. Call your local emergency services (911 in [place] DO NOT drive yourself to the hospital. IMPORTANT  Make a list of every medicine, vitamin, or herbal supplement you are taking. Keep the list with you at all times. Show it to your caregiver at every visit. Keep the list up-to-date.   Ask your caregiver or pharmacist to write an explanation of each medicine you are taking. This should include:   Why you are taking it.   The possible side effects.   The best time of day to take it.   Foods to take with it or what foods to avoid.   When to stop taking it.  MAKE SURE YOU:   Understand these instructions.   Will watch your condition.   Will get help right away if you are not doing well or get worse.  Document Released: 11/01/2005 Document Revised: 10/21/2011 Document Reviewed: 02/13/2010 [employer] Patient Information 2012 [employer]. 

## 2012-04-08 NOTE — Discharge Summary (Signed)
Patient ID: Ronald [name] MRN: [id number] DOB/AGE: 1956/09/05 56 y.o.  Admit date: 04/05/2012 Discharge date: 04/08/2012  Primary Care Physician:  Jeri Cos, MD, MD  Discharge Diagnoses:     Principal Problem:  *Acute systolic CHF (congestive heart failure)  Active Problems:  CAD (coronary artery disease)  Mitral regurgitation  Lung cancer  Cocaine abuse  Status post cardiac catheterization  Acute bronchitis  Tobacco abuse   Medication List  As of 04/08/2012  9:15 AM   STOP taking these medications         clopidogrel 75 MG tablet         TAKE these medications         amitriptyline 25 MG tablet   Commonly known as: ELAVIL   Take 25 mg by mouth at bedtime.      aspirin 325 MG tablet   Take 325 mg by mouth daily.      atorvastatin 20 MG tablet   Commonly known as: LIPITOR   Take 20 mg by mouth daily.      azithromycin 250 MG tablet   Commonly known as: ZITHROMAX   1 tablet daily for 4 days      carvedilol 3.125 MG tablet   Commonly known as: COREG   Take 3.125 mg by mouth 2 (two) times daily with a meal.      guaiFENesin-dextromethorphan 100-10 MG/5ML syrup   Commonly known as: ROBITUSSIN DM   Take 5 mLs by mouth 3 (three) times daily as needed for cough.      oxyCODONE-acetaminophen 7.5-325 MG per tablet   Commonly known as: PERCOCET   Take 1 tablet by mouth every 6 (six) hours as needed. For pain      prasugrel 10 MG Tabs   Commonly known as: EFFIENT   Take 1 tablet (10 mg total) by mouth daily.      ramipril 1.25 MG capsule   Commonly known as: ALTACE   Take 1 capsule (1.25 mg total) by mouth daily.      zolpidem 5 MG tablet   Commonly known as: AMBIEN   Take 5 mg by mouth at bedtime as needed. For sleep            Disposition and Follow-up: -home with outpatient follow up with PCP and Dr Sharyn Lull in 1 week -Needs follow up CT chest with contrast to evaluate increase size of right hilar nodes in 2-4 weeks as outpatient and can  be arranged by PCP. meds started  prasugrel 10 mg po daily  altace 1.25 mg po daily zithromax 250 mg po daily for next 4 days  robitussin  meds discontinued  plavix  Continue ASA 325 mg po daily for 1 month and then switch to 81 mg po daily   Consults:  Rinaldo Cloud ( cardiology)  Significant Diagnostic Studies:  Dg Chest 2 View  04/05/2012  *RADIOLOGY REPORT*  Clinical Data: Shortness of breath  CHEST - 2 VIEW  Comparison: 01/17/2012  Findings: Status post left pneumonectomy with mediastinal shift into the left hemithorax.  Coronary artery stent noted. Right hilar fullness is similar to prior.  Otherwise, cannot evaluate the cardiomediastinal contours. Hyperinflation with interstitial prominence on the right.  No pneumothorax on the right. 1 cm nodular density not seen previously may be artifactual due to superimposed shadows.  Attention on follow-up.  No acute osseous finding.  IMPRESSION: Status post left pneumonectomy.  Right lung emphysema.  Interstitial prominence may reflect chronic change or pulmonary edema.  1  cm nodular density within the right lung was not seen on recent prior, therefore may be artifactual.  Attention on follow-up.  Original Report Authenticated By: Waneta Martins, M.D.   Ct Angio Chest W/cm &/or Wo Cm  04/06/2012  *RADIOLOGY REPORT*  Clinical Data: Right-sided chest pain  CT ANGIOGRAPHY CHEST  Technique:  Multidetector CT imaging of the chest using the standard protocol during bolus administration of intravenous contrast. Multiplanar reconstructed images including MIPs were obtained and reviewed to evaluate the vascular anatomy.  Contrast: OMNIPAQUE IOHEXOL 300 MG/ML  SOLN  Comparison: 08/23/2011  Findings: The evaluation of the lower lobe branches are degraded by respiratory motion.  Otherwise, no pulmonary arterial branch filling defect is identified.  Normal caliber aorta with scattered atherosclerosis.  Status post left pneumonectomy with fluid in the  surgical bed.  Mediastinal structures are shifted leftward.  The previously described right perihilar lymph nodes appear more prominent on today's study.  In total, measures 2.4 x 3.7 cm as compared to 1.6 x 3.2 cm previously.  It is conceivable that the increased may be exaggerated by pleural fluid tracking along the fissure. Small right pleural effusion collects dependently as well.  Centrolobular emphysematous changes.  Interlobular septal prominence.  Peribronchial thickening detailed parenchymal evaluation is degraded by motion.  No pneumothorax.  The trachea and main bronchi are patent.  Status post left thoracotomy.  No acute osseous abnormality identified.  Limited images through the upper abdomen demonstrate no acute abnormality.  IMPRESSION: No pulmonary embolism identified.  Small right pleural effusion.  Right hilar fullness has increased in the interval in this patient with known prominent right hilar lymph nodes. It is possible that the increase may be exaggerated by adjacent fluid along the fissure. Recommend repeat CT after resolution of the volume overload status.  Interlobular septal prominence and peribronchial thickening may reflect edema or infection.  Lymphangitic spread of tumor not entirely excluded.  Original Report Authenticated By: Waneta Martins, M.D.    Brief H and P: For complete details please refer to admission H and P, but in brief 56 year-old male with known history of stage IIIa lung cancer status post pneumonectomy chemotherapy and radiation in 2004, history of CAD status post stenting and polysubstance abuse presented with complaints of shortness of breath which has been worsening last 2 days. Denies any chest pain has some cough with whitish sputum. Denies any fever chills. The shortness of breath his presented exertion and also on lying down. Patient states he did take cocaine 2 days ago. At this time patient is chest pain-free. In the ER patient did have CT angiogram  of the chest which at this time showing fluid overload. BNP is also elevated. Patient admitted for decompensated CHF.   Physical Exam on Discharge:  Filed Vitals:   04/08/12 0050 04/08/12 0145 04/08/12 0400 04/08/12 0742  BP:  104/71 103/74 97/71  Pulse: 80 92 83 84  Temp:   97.4 F (36.3 C) 97.4 F (36.3 C)  TempSrc:   Oral Oral  Resp: 24 23 22 22   Height:      Weight:   66.2 kg (145 lb 15.1 oz)   SpO2: 98% 95% 100% 100%     Intake/Output Summary (Last 24 hours) at 04/08/12 0915 Last data filed at 04/08/12 0900  Gross per 24 hour  Intake   1260 ml  Output   1175 ml  Net     85 ml    General: Alert, awake, oriented x3, in  no acute distress. HEENT: No bruits, no goiter. Heart: Regular rate and rhythm, without murmurs, rubs, gallops. Lungs: Clear to auscultation bilaterally. Abdomen: Soft, nontender, nondistended, positive bowel sounds. Extremities: No clubbing cyanosis or edema with positive pedal pulses. Neuro: Grossly intact, nonfocal.  CBC:    Component Value Date/Time   WBC 6.3 04/08/2012 0534   WBC 6.4 08/23/2011 1116   HGB 13.4 04/08/2012 0534   HGB 14.6 08/23/2011 1116   HCT 38.4* 04/08/2012 0534   HCT 41.9 08/23/2011 1116   PLT 288 04/08/2012 0534   PLT 273 08/23/2011 1116   MCV 84.6 04/08/2012 0534   MCV 85.2 08/23/2011 1116   NEUTROABS 5.1 04/05/2012 2215   NEUTROABS 3.9 08/23/2011 1116   LYMPHSABS 2.4 04/05/2012 2215   LYMPHSABS 1.7 08/23/2011 1116   MONOABS 0.8 04/05/2012 2215   MONOABS 0.6 08/23/2011 1116   EOSABS 0.1 04/05/2012 2215   EOSABS 0.1 08/23/2011 1116   BASOSABS 0.0 04/05/2012 2215   BASOSABS 0.1 08/23/2011 1116    Basic Metabolic Panel:    Component Value Date/Time   NA 136 04/08/2012 0534   NA 141 08/23/2011 1116   K 4.1 04/08/2012 0534   K 4.4 08/23/2011 1116   CL 102 04/08/2012 0534   CL 104 08/23/2011 1116   CO2 25 04/08/2012 0534   CO2 26 08/23/2011 1116   BUN 12 04/08/2012 0534   BUN 8 08/23/2011 1116   CREATININE 0.85 04/08/2012 0534    CREATININE 1.0 08/23/2011 1116   GLUCOSE 87 04/08/2012 0534   GLUCOSE 88 08/23/2011 1116   CALCIUM 8.5 04/08/2012 0534   CALCIUM 10.0 08/23/2011 1116    Hospital Course:    *Acute systolic CHF (congestive heart failure) Patient admitted to stepdown monitoring. He was diursed with IV lasix. A 2 D echo obtained shows significantly reduced EF of 30-35% with severe MR.  -serial cardiac enzymes were negative. Patient reported of active smoking and cocaine use 2 days prior to admission. He was seen by Dr Sharyn Lull and had cardiac cath done on 5/24 with PTCA stenting to left circumflex and ramus. Patient now started on prasugrel.  Continue ASA 325 mg daily for 30 days and then switch to 81 mg po daily. Discontinue plavix. continue lipitor and coreg.  low BP noted yesterday. BP stable this am and will continue low dose altace that has been started. Patient seen by cardiac rehab and doing well      CAD (coronary artery disease) As above. Status post PTCA with tenting on 5/24. Follow up with Dr Sharyn Lull in 1 week   Mitral regurgitation Has severe regurgitation as per echo. Currently euvolemic and given borderline BP will not start lasix at this time.   Lung cancer History of lung cancer status post pneumonectomy and radiation chemotherapy . Patient 's CT angiogram this admission showed right hilar prominence which could have been enhanced secondary to volume overload and radiologist recommends repeat CT chest after adequate diuresis.  i spoke with radiologist on call today and given very recent volume overload and contrast burden secondary to both CT and cardiac cath recently it is better to repeat CT chest in 2-4 weeks when he is well euvolemic.  his outpatient CT chest with contrast should be arranged by his PCP in 2-4 weeks.   Cocaine abuse Counseled strongly on cocaine cessation and significant harmful impact on his heart    Acute bronchitis continue Zithromax Symptoms improved with nebs    Tobacco abuse counseled on smoking cessation  Patient stable for discharge home with outpatient follow up with PCP and cardiology   Time spent on Discharge: 45 minutes  Signed: Eddie North 04/08/2012, 9:15 AM

## 2012-04-08 NOTE — Progress Notes (Addendum)
CARDIAC REHAB PHASE I   PRE:  Rate/Rhythm: 86 SR  BP:  Supine: 97/71  Sitting:   Standing:    SaO2: 100 2L 96 RA  MODE:  Ambulation: 680 ft   POST:  Rate/Rhythem: 102 ST  BP:  Supine:   Sitting: 108/75  Standing:    SaO2: 92-95 RA during and after walk [id number] On arrival pt c/o of SOB and congestion. Pt in bed, encouraged him to get in recliner to sit upright to help him breath better. On 2L of O2 on arrival. Pt states that he has had this chest congestion for a long time and that his primary care physician does not do anything about it. When pt in chair took O2 off sat 96%. Pt able to walk 680 feet without oxygen and c/o of cp . States that his SOB is much improved over what he was before coming to the hospital. He walked the 680 feet steady paced without stopping. RA sat during walk and after walk 92-95%. Completed discharge education with pt. He agrees to [facility] in [place], will send referral.   Ronald [name]

## 2012-04-10 MED FILL — Dextrose Inj 5%: INTRAVENOUS | Qty: 50 | Status: AC

## 2012-07-05 ENCOUNTER — Other Ambulatory Visit: Payer: Self-pay | Admitting: Internal Medicine

## 2012-07-05 ENCOUNTER — Telehealth: Payer: Self-pay | Admitting: Internal Medicine

## 2012-07-05 DIAGNOSIS — C3490 Malignant neoplasm of unspecified part of unspecified bronchus or lung: Secondary | ICD-10-CM

## 2012-07-05 NOTE — Telephone Encounter (Signed)
Mailed the pt his oct 2013 appts at the address he provided

## 2012-08-10 ENCOUNTER — Telehealth: Payer: Self-pay | Admitting: Internal Medicine

## 2012-08-10 NOTE — Telephone Encounter (Signed)
Moved 10/14 appt to 12 pm due to call day. Called pt @ [phone] and [phone] the # left to reach pt on vm of primary number. Re change w/new time. Also added comment to 10/9 appt to send pt for new schedule.

## 2012-08-23 ENCOUNTER — Ambulatory Visit (HOSPITAL_COMMUNITY): Payer: Medicare Other

## 2012-08-23 ENCOUNTER — Other Ambulatory Visit: Payer: Self-pay | Admitting: Internal Medicine

## 2012-08-23 ENCOUNTER — Other Ambulatory Visit: Payer: Medicare Other

## 2012-08-24 ENCOUNTER — Other Ambulatory Visit: Payer: Self-pay | Admitting: *Deleted

## 2012-08-25 ENCOUNTER — Telehealth: Payer: Self-pay | Admitting: Internal Medicine

## 2012-08-25 NOTE — Telephone Encounter (Signed)
called above # which instructs you to call [phone].  called that number and lm for him to call and r/s all appts as we would cx 10/14 since he did not have his ct done.   aom

## 2012-08-28 ENCOUNTER — Ambulatory Visit: Payer: Medicare Other | Admitting: Internal Medicine

## 2012-08-28 ENCOUNTER — Other Ambulatory Visit: Payer: Medicare Other | Admitting: Lab

## 2012-09-15 ENCOUNTER — Emergency Department (HOSPITAL_COMMUNITY): Payer: Medicare Other

## 2012-09-15 ENCOUNTER — Inpatient Hospital Stay (HOSPITAL_COMMUNITY)
Admission: EM | Admit: 2012-09-15 | Discharge: 2012-09-18 | DRG: 191 | Disposition: A | Discharge status: Home Health | Payer: Medicare Other | Attending: Internal Medicine | Admitting: Internal Medicine

## 2012-09-15 ENCOUNTER — Encounter (HOSPITAL_COMMUNITY): Payer: Self-pay | Admitting: Family Medicine

## 2012-09-15 DIAGNOSIS — Z7902 Long term (current) use of antithrombotics/antiplatelets: Secondary | ICD-10-CM

## 2012-09-15 DIAGNOSIS — Z79899 Other long term (current) drug therapy: Secondary | ICD-10-CM

## 2012-09-15 DIAGNOSIS — I5022 Chronic systolic (congestive) heart failure: Secondary | ICD-10-CM | POA: Diagnosis present

## 2012-09-15 DIAGNOSIS — F1410 Cocaine abuse, uncomplicated: Secondary | ICD-10-CM | POA: Diagnosis present

## 2012-09-15 DIAGNOSIS — Z902 Acquired absence of lung [part of]: Secondary | ICD-10-CM

## 2012-09-15 DIAGNOSIS — Z9861 Coronary angioplasty status: Secondary | ICD-10-CM

## 2012-09-15 DIAGNOSIS — I059 Rheumatic mitral valve disease, unspecified: Secondary | ICD-10-CM | POA: Diagnosis present

## 2012-09-15 DIAGNOSIS — J449 Chronic obstructive pulmonary disease, unspecified: Secondary | ICD-10-CM | POA: Diagnosis present

## 2012-09-15 DIAGNOSIS — Z8249 Family history of ischemic heart disease and other diseases of the circulatory system: Secondary | ICD-10-CM

## 2012-09-15 DIAGNOSIS — I509 Heart failure, unspecified: Secondary | ICD-10-CM | POA: Diagnosis present

## 2012-09-15 DIAGNOSIS — F17200 Nicotine dependence, unspecified, uncomplicated: Secondary | ICD-10-CM

## 2012-09-15 DIAGNOSIS — R0989 Other specified symptoms and signs involving the circulatory and respiratory systems: Secondary | ICD-10-CM

## 2012-09-15 DIAGNOSIS — J209 Acute bronchitis, unspecified: Secondary | ICD-10-CM

## 2012-09-15 DIAGNOSIS — R079 Chest pain, unspecified: Secondary | ICD-10-CM | POA: Diagnosis not present

## 2012-09-15 DIAGNOSIS — Z720 Tobacco use: Secondary | ICD-10-CM | POA: Diagnosis present

## 2012-09-15 DIAGNOSIS — Z85118 Personal history of other malignant neoplasm of bronchus and lung: Secondary | ICD-10-CM

## 2012-09-15 DIAGNOSIS — E7800 Pure hypercholesterolemia, unspecified: Secondary | ICD-10-CM | POA: Diagnosis present

## 2012-09-15 DIAGNOSIS — R0600 Dyspnea, unspecified: Secondary | ICD-10-CM

## 2012-09-15 DIAGNOSIS — I5021 Acute systolic (congestive) heart failure: Secondary | ICD-10-CM

## 2012-09-15 DIAGNOSIS — I2589 Other forms of chronic ischemic heart disease: Secondary | ICD-10-CM | POA: Diagnosis present

## 2012-09-15 DIAGNOSIS — Z880 Allergy status to penicillin: Secondary | ICD-10-CM

## 2012-09-15 DIAGNOSIS — Z23 Encounter for immunization: Secondary | ICD-10-CM

## 2012-09-15 DIAGNOSIS — I2789 Other specified pulmonary heart diseases: Secondary | ICD-10-CM | POA: Diagnosis present

## 2012-09-15 DIAGNOSIS — I38 Endocarditis, valve unspecified: Secondary | ICD-10-CM | POA: Diagnosis present

## 2012-09-15 DIAGNOSIS — C3490 Malignant neoplasm of unspecified part of unspecified bronchus or lung: Secondary | ICD-10-CM | POA: Diagnosis present

## 2012-09-15 DIAGNOSIS — Z9889 Other specified postprocedural states: Secondary | ICD-10-CM

## 2012-09-15 DIAGNOSIS — J441 Chronic obstructive pulmonary disease with (acute) exacerbation: Principal | ICD-10-CM | POA: Diagnosis present

## 2012-09-15 DIAGNOSIS — I340 Nonrheumatic mitral (valve) insufficiency: Secondary | ICD-10-CM

## 2012-09-15 DIAGNOSIS — I2510 Atherosclerotic heart disease of native coronary artery without angina pectoris: Secondary | ICD-10-CM | POA: Diagnosis present

## 2012-09-15 DIAGNOSIS — Z7982 Long term (current) use of aspirin: Secondary | ICD-10-CM

## 2012-09-15 LAB — CBC WITH DIFFERENTIAL/PLATELET
Basophils Relative: 0 % (ref 0–1)
Eosinophils Relative: 1 % (ref 0–5)
HCT: 42.3 % (ref 39.0–52.0)
Hemoglobin: 15 g/dL (ref 13.0–17.0)
Lymphocytes Relative: 18 % (ref 12–46)
MCHC: 35.5 g/dL (ref 30.0–36.0)
MCV: 86.3 fL (ref 78.0–100.0)
Monocytes Absolute: 0.7 10*3/uL (ref 0.1–1.0)
Monocytes Relative: 10 % (ref 3–12)
Neutro Abs: 5.4 10*3/uL (ref 1.7–7.7)

## 2012-09-15 LAB — CBC
HCT: 38.1 % — ABNORMAL LOW (ref 39.0–52.0)
MCHC: 35.7 g/dL (ref 30.0–36.0)
RDW: 15.7 % — ABNORMAL HIGH (ref 11.5–15.5)

## 2012-09-15 LAB — CREATININE, SERUM: GFR calc non Af Amer: >90 mL/min (ref >90)

## 2012-09-15 LAB — BASIC METABOLIC PANEL
BUN: 11 mg/dL (ref 6–23)
CO2: 24 mEq/L (ref 19–32)
Calcium: 9.4 mg/dL (ref 8.4–10.5)
Chloride: 104 mEq/L (ref 96–112)
Creatinine, Ser: 0.85 mg/dL (ref 0.50–1.35)

## 2012-09-15 LAB — POCT I-STAT TROPONIN I: Troponin i, poc: 0 ng/mL (ref 0.00–0.08)

## 2012-09-15 MED ORDER — PREDNISONE 20 MG PO TABS
60.0000 mg | ORAL_TABLET | Freq: Once | ORAL | Status: AC
  Administered 2012-09-15: 60 mg via ORAL
  Filled 2012-09-15: qty 3

## 2012-09-15 MED ORDER — AMITRIPTYLINE HCL 25 MG PO TABS
25.0000 mg | ORAL_TABLET | Freq: Every day | ORAL | Status: DC
  Administered 2012-09-15 – 2012-09-17 (×3): 25 mg via ORAL
  Filled 2012-09-15 (×6): qty 1

## 2012-09-15 MED ORDER — ATORVASTATIN CALCIUM 20 MG PO TABS
20.0000 mg | ORAL_TABLET | Freq: Every day | ORAL | Status: DC
  Administered 2012-09-15 – 2012-09-17 (×3): 20 mg via ORAL
  Filled 2012-09-15 (×5): qty 1

## 2012-09-15 MED ORDER — ENOXAPARIN SODIUM 40 MG/0.4ML ~~LOC~~ SOLN
40.0000 mg | SUBCUTANEOUS | Status: DC
  Administered 2012-09-15 – 2012-09-17 (×3): 40 mg via SUBCUTANEOUS
  Filled 2012-09-15 (×5): qty 0.4

## 2012-09-15 MED ORDER — CARVEDILOL 3.125 MG PO TABS
3.1250 mg | ORAL_TABLET | Freq: Two times a day (BID) | ORAL | Status: DC
  Administered 2012-09-15 – 2012-09-18 (×7): 3.125 mg via ORAL
  Filled 2012-09-15 (×9): qty 1

## 2012-09-15 MED ORDER — OXYCODONE-ACETAMINOPHEN 5-325 MG PO TABS: 1.5000 | ORAL_TABLET | Freq: Two times a day (BID) | ORAL | Status: DC | PRN

## 2012-09-15 MED ORDER — NICOTINE 21 MG/24HR TD PT24
21.0000 mg | MEDICATED_PATCH | Freq: Every day | TRANSDERMAL | Status: DC
  Administered 2012-09-15 – 2012-09-18 (×4): 21 mg via TRANSDERMAL
  Filled 2012-09-15 (×4): qty 1

## 2012-09-15 MED ORDER — ASPIRIN 81 MG PO CHEW
324.0000 mg | CHEWABLE_TABLET | Freq: Once | ORAL | Status: AC
  Administered 2012-09-15: 324 mg via ORAL
  Filled 2012-09-15: qty 4

## 2012-09-15 MED ORDER — ALBUTEROL SULFATE (5 MG/ML) 0.5% IN NEBU
5.0000 mg | INHALATION_SOLUTION | Freq: Once | RESPIRATORY_TRACT | Status: AC
  Administered 2012-09-15: 5 mg via RESPIRATORY_TRACT
  Filled 2012-09-15: qty 1

## 2012-09-15 MED ORDER — OXYCODONE-ACETAMINOPHEN 7.5-325 MG PO TABS: 1.0000 | ORAL_TABLET | Freq: Two times a day (BID) | ORAL | Status: DC | PRN

## 2012-09-15 MED ORDER — PRASUGREL HCL 10 MG PO TABS
10.0000 mg | ORAL_TABLET | Freq: Every day | ORAL | Status: DC
  Administered 2012-09-15 – 2012-09-18 (×4): 10 mg via ORAL
  Filled 2012-09-15 (×5): qty 1

## 2012-09-15 MED ORDER — ALBUTEROL SULFATE HFA 108 (90 BASE) MCG/ACT IN AERS: 2.0000 | INHALATION_SPRAY | RESPIRATORY_TRACT | Status: DC | PRN

## 2012-09-15 MED ORDER — ASPIRIN 325 MG PO TABS
325.0000 mg | ORAL_TABLET | Freq: Every day | ORAL | Status: DC
  Administered 2012-09-16 – 2012-09-18 (×3): 325 mg via ORAL
  Filled 2012-09-15 (×3): qty 1

## 2012-09-15 MED ORDER — LEVOFLOXACIN IN D5W 500 MG/100ML IV SOLN
500.0000 mg | INTRAVENOUS | Status: DC
  Administered 2012-09-15: 500 mg via INTRAVENOUS
  Filled 2012-09-15 (×3): qty 100

## 2012-09-15 MED ORDER — METHYLPREDNISOLONE SODIUM SUCC 125 MG IJ SOLR
60.0000 mg | Freq: Four times a day (QID) | INTRAMUSCULAR | Status: DC
  Administered 2012-09-15 – 2012-09-17 (×9): 60 mg via INTRAVENOUS
  Filled 2012-09-15: qty 2
  Filled 2012-09-15 (×11): qty 0.96

## 2012-09-15 MED ORDER — PNEUMOCOCCAL VAC POLYVALENT 25 MCG/0.5ML IJ INJ
0.5000 mL | INJECTION | INTRAMUSCULAR | Status: AC
  Administered 2012-09-16: 0.5 mL via INTRAMUSCULAR
  Filled 2012-09-15: qty 0.5

## 2012-09-15 MED ORDER — METHYLPREDNISOLONE SODIUM SUCC 125 MG IJ SOLR
INTRAMUSCULAR | Status: AC
  Administered 2012-09-15: 60 mg via INTRAVENOUS
  Filled 2012-09-15: qty 2

## 2012-09-15 MED ORDER — LEVALBUTEROL HCL 0.63 MG/3ML IN NEBU
0.6300 mg | INHALATION_SOLUTION | Freq: Four times a day (QID) | RESPIRATORY_TRACT | Status: DC
  Administered 2012-09-16 – 2012-09-18 (×10): 0.63 mg via RESPIRATORY_TRACT
  Filled 2012-09-15 (×15): qty 3

## 2012-09-15 MED ORDER — SODIUM CHLORIDE 0.9 % IJ SOLN
3.0000 mL | Freq: Two times a day (BID) | INTRAMUSCULAR | Status: DC
  Administered 2012-09-15 – 2012-09-18 (×6): 3 mL via INTRAVENOUS

## 2012-09-15 MED ORDER — IPRATROPIUM BROMIDE 0.02 % IN SOLN
0.5000 mg | Freq: Once | RESPIRATORY_TRACT | Status: AC
  Administered 2012-09-15: 0.5 mg via RESPIRATORY_TRACT
  Filled 2012-09-15: qty 2.5

## 2012-09-15 MED ORDER — IPRATROPIUM BROMIDE 0.02 % IN SOLN
0.5000 mg | Freq: Four times a day (QID) | RESPIRATORY_TRACT | Status: DC
  Administered 2012-09-16 – 2012-09-18 (×10): 0.5 mg via RESPIRATORY_TRACT
  Filled 2012-09-15 (×10): qty 2.5

## 2012-09-15 MED ORDER — INFLUENZA VIRUS VACC SPLIT PF IM SUSP
0.5000 mL | INTRAMUSCULAR | Status: AC
  Administered 2012-09-16: 0.5 mL via INTRAMUSCULAR
  Filled 2012-09-15: qty 0.5

## 2012-09-15 MED ORDER — FUROSEMIDE 10 MG/ML IJ SOLN
40.0000 mg | Freq: Once | INTRAMUSCULAR | Status: AC
  Administered 2012-09-15: 40 mg via INTRAVENOUS
  Filled 2012-09-15: qty 4

## 2012-09-15 MED ORDER — RAMIPRIL 1.25 MG PO CAPS
1.2500 mg | ORAL_CAPSULE | Freq: Every day | ORAL | Status: DC
  Administered 2012-09-15 – 2012-09-18 (×4): 1.25 mg via ORAL
  Filled 2012-09-15 (×5): qty 1

## 2012-09-15 MED ORDER — ZOLPIDEM TARTRATE 5 MG PO TABS
5.0000 mg | ORAL_TABLET | Freq: Every evening | ORAL | Status: DC | PRN
  Administered 2012-09-15 – 2012-09-17 (×3): 5 mg via ORAL
  Filled 2012-09-15 (×3): qty 1

## 2012-09-15 NOTE — H&P (Addendum)
[facility] History and Physical  Ronald [name] ZOX:[id number] DOB: 03-Oct-1956 DOA: 09/15/2012  Referring physician: dr Ethelda Chick PCP: Jeri Cos, MD  Specialists: none  Chief Complaint: shortness of breath since one week.   HPI: Ronald [name] is a 56 y.o. male with h/o COPD , ischemic cardiomyopathy, with  Chronic systolic heart failure, not on diuretics, lung cancer s/p pneumonectomy, still smoking comes in for sob since one week, associated with cough with brown sputum, no fever or chills, orthopnea, no pnd, or chest pain. He was found to be diffusely wheezing and his CXR in ED does not show pulm edema or pneumonia but his pro bnp is elevated. He is being admitted for management of pneumonia and mild chf exacerbation.  Review of Systems: The patient denies anorexia, fever, weight loss,, vision loss, decreased hearing, hoarseness, chest pain, syncope, peripheral edema, balance deficits, hemoptysis, abdominal pain, melena, hematochezia, severe indigestion/heartburn, hematuria, incontinence, genital sores, muscle weakness, suspicious skin lesions, transient blindness, difficulty walking, depression, unusual weight change, abnormal bleeding, enlarged lymph nodes, angioedema, and breast masses.    Past Medical History  Diagnosis Date  . COPD (chronic obstructive pulmonary disease)   . CHF (congestive heart failure)   . lung ca dx'd 2005    chemo/xrt comp 2005, lung ca   Past Surgical History  Procedure Date  . Coronary angioplasty with stent placement   . Pneumonectomy 2005    left   Social History:  reports that he has been smoking Cigarettes.  He has never used smokeless tobacco. He reports that he uses illicit drugs (Cocaine). He reports that he does not drink alcohol.  where does patient live--home,  Can patient participate in ADLs?  Allergies  Allergen Reactions  . Penicillins Itching    Family History  Problem Relation Age of Onset  . Hypertension Other    . Diabetes Other     Prior to Admission medications   Medication Sig Start Date End Date Taking? Authorizing Provider  amitriptyline (ELAVIL) 25 MG tablet Take 25 mg by mouth at bedtime.   Yes Historical Provider, MD  aspirin 325 MG tablet Take 325 mg by mouth daily.   Yes Historical Provider, MD  atorvastatin (LIPITOR) 20 MG tablet Take 20 mg by mouth daily.   Yes Historical Provider, MD  carvedilol (COREG) 3.125 MG tablet Take 3.125 mg by mouth 2 (two) times daily with a meal.   Yes Historical Provider, MD  oxyCODONE-acetaminophen (PERCOCET) 7.5-325 MG per tablet Take 1 tablet by mouth 2 (two) times daily as needed. For pain   Yes Historical Provider, MD  prasugrel (EFFIENT) 10 MG TABS Take 10 mg by mouth daily. 04/08/12  Yes Nishant Dhungel, MD  ramipril (ALTACE) 1.25 MG capsule Take 1.25 mg by mouth daily. 04/08/12 04/08/13 Yes Nishant Dhungel, MD  zolpidem (AMBIEN) 5 MG tablet Take 5 mg by mouth at bedtime as needed. For sleep   Yes Historical Provider, MD   Physical Exam: Filed Vitals:   09/15/12 1227 09/15/12 1308 09/15/12 1314 09/15/12 1557  BP: 122/79 125/84  109/72  Pulse: 97 101    Temp: 98.6 F (37 C)     TempSrc: Oral     Resp: 20 25  28   SpO2: 100% 92% 92% 94%    Constitutional: Vital signs reviewed.  Patient is a well-developed and well-nourished  in no acute distress and cooperative with exam. Alert and oriented x3.  Head: Normocephalic and atraumatic Mouth: no erythema or exudates, MMM Eyes: PERRL, EOMI, conjunctivae  normal, No scleral icterus.  Neck: Supple, Trachea midline normal ROM, No JVD, mass, thyromegaly, or carotid bruit present.  Cardiovascular: RRR, S1 normal, S2 normal, no MRG, pulses symmetric and intact bilaterally Pulmonary/Chest: CTAB, diffusely wheezing, rales, or rhonchi Abdominal: Soft. Non-tender, non-distended, bowel sounds are normal, no masses, organomegaly, or guarding present.  Musculoskeletal: No joint deformities, erythema, or stiffness,  ROM full and no nontender Neurological: A&O x3, Strength is normal and symmetric bilaterally, cranial nerve II-XII are grossly intact, no focal motor deficit, sensory intact to light touch bilaterally.  Skin: Warm, dry and intact. No rash, cyanosis, or clubbing.  Psychiatric: Normal mood and affect. speech and behavior is normal. Judgment and thought content normal. Cognition and memory are normal.    Labs on Admission:  Basic Metabolic Panel:  Lab 09/15/12 1610  NA 139  K 4.4  CL 104  CO2 24  GLUCOSE 85  BUN 11  CREATININE 0.85  CALCIUM 9.4  MG --  PHOS --   Liver Function Tests: No results found for this basename: AST:5,ALT:5,ALKPHOS:5,BILITOT:5,PROT:5,ALBUMIN:5 in the last 168 hours No results found for this basename: LIPASE:5,AMYLASE:5 in the last 168 hours No results found for this basename: AMMONIA:5 in the last 168 hours CBC:  Lab 09/15/12 1242  WBC 7.5  NEUTROABS 5.4  HGB 15.0  HCT 42.3  MCV 86.3  PLT 304   Cardiac Enzymes: No results found for this basename: CKTOTAL:5,CKMB:5,CKMBINDEX:5,TROPONINI:5 in the last 168 hours  BNP (last 3 results)  Basename 09/15/12 1242 04/05/12 2215  PROBNP 2820.0* 3204.0*   CBG: No results found for this basename: GLUCAP:5 in the last 168 hours  Radiological Exams on Admission: Dg Chest 2 View  09/15/2012  *RADIOLOGY REPORT*  Clinical Data: 56 year old male with shortness of breath cough weakness and history of lung cancer.  CHEST - 2 VIEW  Comparison: 04/06/2012 and earlier.  Findings: Sequelae of left pneumonectomy.  Stable mediastinal contours.  Stable increased interstitial markings throughout the right lung.  No discrete pulmonary mass or nodule. Chronic/postoperative osseous changes to the left ribs. Small volume of gas probably in the thoracic esophagus on the left. Negative visualized bowel gas pattern.  IMPRESSION: Stable appearance of the chest status post left pneumonectomy.  No acute cardiopulmonary abnormality  identified.   Original Report Authenticated By: Erskine Speed, M.D.     EKG: NSR  Assessment/Plan Active Problems:    1. COPD exacerbation:  - admit to telemetry - started on IV Solumedrol. - on levaquin.  - nebs as needed. - oxygen as needed.   2. CHF Exacerbation: mild. - started on lasix. - repeat BNP in am. - resume home medications.  - last echo in 5/13  3. Ischemic CArdiomyopathy: no chest pain. Resume home medications.   4. DVT prophylaxis 5. Tobacco abuse: nicotine patch Cessation counseling ordered.   Code Status: full code Family Communication: none at bedside Disposition Plan: in 2 to 3 days home.   [facility] Pager [phone]  If 7PM-7AM, please contact night-coverage [email or web address] Password [facility] 09/15/2012, 4:46 PM

## 2012-09-15 NOTE — ED Provider Notes (Signed)
History     CSN: [id number]  Arrival date & time 09/15/12  1219   First MD Initiated Contact with Patient 09/15/12 1314      Chief Complaint  Patient presents with  . Shortness of Breath  . Cough    (Consider location/radiation/quality/duration/timing/severity/associated sxs/prior treatment) HPI Comments: Patient with a history of COPD, Lung Cancer, Coronary Artery Disease, and CHF comes in today with a chief complaint of SOB.  He reports that he has been feeling short of breath for the past 3 days.  He has also had a productive cough and wheezing over the past 2-3 days.  He is coughing up brownish color sputum.  He reports that he has pain in his chest when he coughs, but only when he coughs.  He denies orthopnea or PND.  He does have some dyspnea on exertion.  He is currently does not use any inhaler.  He smokes occasionally.  Last time he smoked cigarettes was two days ago.  He was diagnosed with Lung Cancer in 2005.  He had a left thoracectomy, chemotherapy, and radiation.  Lung cancer followed by Dr. Sofie Hartigan.  He has had stents put in his coronary arteries twice.  Stents last placed in May 2013 by Dr. Sharyn Lull.  His PCP is Dr. Roanna Epley.    The history is provided by the patient.    Past Medical History  Diagnosis Date  . COPD (chronic obstructive pulmonary disease)   . CHF (congestive heart failure)   . lung ca dx'd 2005    chemo/xrt comp 2005, lung ca    Past Surgical History  Procedure Date  . Coronary angioplasty with stent placement   . Pneumonectomy 2005    left    Family History  Problem Relation Age of Onset  . Hypertension Other   . Diabetes Other     History  Substance Use Topics  . Smoking status: Current Some Day Smoker    Types: Cigarettes  . Smokeless tobacco: Never Used  . Alcohol Use: No     former      Review of Systems  Constitutional: Negative for fever, chills and diaphoresis.  Respiratory: Positive for cough, chest tightness, shortness of  breath and wheezing.   Cardiovascular: Negative for chest pain.  Gastrointestinal: Negative for nausea and vomiting.  Neurological: Negative for dizziness, syncope and light-headedness.    Allergies  Penicillins  Home Medications   Current Outpatient Rx  Name Route Sig Dispense Refill  . AMITRIPTYLINE HCL 25 MG PO TABS Oral Take 25 mg by mouth at bedtime.    . ASPIRIN 325 MG PO TABS Oral Take 325 mg by mouth daily.    . ATORVASTATIN CALCIUM 20 MG PO TABS Oral Take 20 mg by mouth daily.    Marland Kitchen CARVEDILOL 3.125 MG PO TABS Oral Take 3.125 mg by mouth 2 (two) times daily with a meal.    . OXYCODONE-ACETAMINOPHEN 7.5-325 MG PO TABS Oral Take 1 tablet by mouth 2 (two) times daily as needed. For pain    . PRASUGREL HCL 10 MG PO TABS Oral Take 10 mg by mouth daily.    Marland Kitchen RAMIPRIL 1.25 MG PO CAPS Oral Take 1.25 mg by mouth daily.    Marland Kitchen ZOLPIDEM TARTRATE 5 MG PO TABS Oral Take 5 mg by mouth at bedtime as needed. For sleep      BP 125/84  Pulse 101  Temp 98.6 F (37 C) (Oral)  Resp 25  SpO2 92%  Physical Exam  Nursing  note and vitals reviewed. Constitutional: He appears well-developed and well-nourished. No distress.  HENT:  Head: Normocephalic and atraumatic.  Neck: Normal range of motion. Neck supple.  Cardiovascular: Normal rate, regular rhythm and normal heart sounds.   Pulmonary/Chest: Accessory muscle usage present. Tachypnea noted. He has decreased breath sounds. He has wheezes. He has rhonchi.       Mild diffuse expiratory wheezing Patient able to speak in full sentences  Neurological: He is alert.  Skin: Skin is warm and dry. He is not diaphoretic.  Psychiatric: He has a normal mood and affect.    ED Course  Procedures (including critical care time)  Labs Reviewed  CBC WITH DIFFERENTIAL - Abnormal; Notable for the following:    RDW 15.8 (*)     All other components within normal limits  PRO B NATRIURETIC PEPTIDE - Abnormal; Notable for the following:    Pro B  Natriuretic peptide (BNP) 2820.0 (*)     All other components within normal limits  BASIC METABOLIC PANEL  PROTIME-INR  POCT I-STAT TROPONIN I   Dg Chest 2 View  09/15/2012  *RADIOLOGY REPORT*  Clinical Data: 56 year old male with shortness of breath cough weakness and history of lung cancer.  CHEST - 2 VIEW  Comparison: 04/06/2012 and earlier.  Findings: Sequelae of left pneumonectomy.  Stable mediastinal contours.  Stable increased interstitial markings throughout the right lung.  No discrete pulmonary mass or nodule. Chronic/postoperative osseous changes to the left ribs. Small volume of gas probably in the thoracic esophagus on the left. Negative visualized bowel gas pattern.  IMPRESSION: Stable appearance of the chest status post left pneumonectomy.  No acute cardiopulmonary abnormality identified.   Original Report Authenticated By: Erskine Speed, M.D.     Date: 09/15/2012  Rate: 98  Rhythm: normal sinus rhythm  QRS Axis: normal  Intervals: normal  ST/T Wave abnormalities: nonspecific T wave changes  Conduction Disutrbances:none  Narrative Interpretation:   Old EKG Reviewed: changes noted, twave inversions in leads I and AVL more pronounced than previous EKG    No diagnosis found.  Patient discussed with Dr. Blake Divine who has agreed to admit the patient.    MDM  Patient with history of COPD, Lung Cancer s/p left thoracectomy, CAD, and CHF presents with Shortness of breath.  Patient using some accessory muscles when breathing.  Patient given nebulized breathing treatment and Prednisone.  CXR negative.  BNP found to be elevated at 2820.  Patient admitted to Hospitalist service for further management of dyspnea.          Pascal Lux Eagle, PA-C 09/15/12 2054

## 2012-09-15 NOTE — ED Notes (Signed)
Pt. States that three days ago he started out productive cough at night. States sputum is a tan color. Denies fevers. States he has to sleep on his side. States chest tightness when he walks and after he coughs.

## 2012-09-15 NOTE — ED Provider Notes (Signed)
Presents with cough productive of thick sputum for the past 3 days the cut her right shortness of breath. Shortness of breath is worse with exertion. On exam patient in mild respiratory distress speaks in sentences lungs diminished breath sounds right side rhonchi left side Mondays accessory muscles  Doug Sou, MD 09/15/12 1642

## 2012-09-15 NOTE — Progress Notes (Signed)
Patient transferred to 4705 from the ED via stretcher. Patient oriented to room and call bell system. Patient is familiar with heart failure information and states he has watched the video but states may watch it again later. Vital signs taken and are stable. Patient lying in bed and is alert oriented. Will monitor as needed to the end of shift.

## 2012-09-16 DIAGNOSIS — Z720 Tobacco use: Secondary | ICD-10-CM

## 2012-09-16 DIAGNOSIS — J441 Chronic obstructive pulmonary disease with (acute) exacerbation: Secondary | ICD-10-CM

## 2012-09-16 LAB — BASIC METABOLIC PANEL
BUN: 14 mg/dL (ref 6–23)
CO2: 23 mEq/L (ref 19–32)
Chloride: 102 mEq/L (ref 96–112)
Creatinine, Ser: 0.83 mg/dL (ref 0.50–1.35)
GFR calc Af Amer: >90 mL/min (ref >90)
Glucose, Bld: 174 mg/dL — ABNORMAL HIGH (ref 70–99)

## 2012-09-16 LAB — CBC
HCT: 38.5 % — ABNORMAL LOW (ref 39.0–52.0)
MCH: 30.3 pg (ref 26.0–34.0)
MCHC: 35.3 g/dL (ref 30.0–36.0)
MCV: 85.7 fL (ref 78.0–100.0)
RDW: 15.6 % — ABNORMAL HIGH (ref 11.5–15.5)

## 2012-09-16 LAB — RAPID URINE DRUG SCREEN, HOSP PERFORMED
Amphetamines: NOT DETECTED
Barbiturates: NOT DETECTED
Benzodiazepines: NOT DETECTED
Cocaine: NOT DETECTED
Tetrahydrocannabinol: NOT DETECTED

## 2012-09-16 MED ORDER — LEVOFLOXACIN 750 MG PO TABS
750.0000 mg | ORAL_TABLET | Freq: Every day | ORAL | Status: DC
  Administered 2012-09-16 – 2012-09-17 (×2): 750 mg via ORAL
  Filled 2012-09-16 (×4): qty 1

## 2012-09-16 NOTE — Progress Notes (Signed)
UR COMPLETED.   Alizah Sills Wise Zeb Rawl, RN, BSN    Phone #[phone] 

## 2012-09-16 NOTE — Progress Notes (Signed)
[facility] PROGRESS NOTE  Ronald [name]:[id number] DOB: 08/20/1956 DOA: 09/15/2012 PCP: Jeri Cos, MD  Assessment/Plan: COPD exacerbation -Continue intravenous steroids -Sputum Gram stain and culture -Pulmonary toilet -Continue duo nebs -Continue Levaquin, switched to oral Tobacco abuse -Tobacco cessation discussed -Continue Nicoderm patch Pulmonary arterial hypertension -Likely contributing to the patient's elevated proBNP -Clinically does not appear to be fluid overloaded Ischemic cardiomyopathy -04/06/2012 echocardiogram ejection fraction 30-35% -Continue ramipril       Antibiotics:  Levaquin November 1>>>    Procedures/Studies: Dg Chest 2 View  09/15/2012  *RADIOLOGY REPORT*  Clinical Data: 56 year old male with shortness of breath cough weakness and history of lung cancer.  CHEST - 2 VIEW  Comparison: 04/06/2012 and earlier.  Findings: Sequelae of left pneumonectomy.  Stable mediastinal contours.  Stable increased interstitial markings throughout the right lung.  No discrete pulmonary mass or nodule. Chronic/postoperative osseous changes to the left ribs. Small volume of gas probably in the thoracic esophagus on the left. Negative visualized bowel gas pattern.  IMPRESSION: Stable appearance of the chest status post left pneumonectomy.  No acute cardiopulmonary abnormality identified.   Original Report Authenticated By: Erskine Speed, M.D.          Subjective: Patient states that he is breathing 25% better. He denies any fevers, chills, chest pain,  nausea, vomiting, diarrhea, abdominal pain,, dysuria, hematuria. He is coughing up brown sputum. Continues to have dyspnea on exertion but slowly improving.  Objective: Filed Vitals:   09/15/12 1841 09/15/12 2031 09/16/12 0218 09/16/12 0440  BP: 118/76 109/70  103/67  Pulse: 95 82  83  Temp:  98 F (36.7 C)  97.7 F (36.5 C)  TempSrc:  Oral  Oral  Resp:  18  18  Height:      Weight:    64.955  kg (143 lb 3.2 oz)  SpO2:  95% 95% 96%    Intake/Output Summary (Last 24 hours) at 09/16/12 0837 Last data filed at 09/16/12 8119  Gross per 24 hour  Intake    820 ml  Output   1300 ml  Net   -480 ml   Weight change:  Exam:   General:  Pt is alert, follows commands appropriately, not in acute distress  HEENT: No icterus, No thrush, No neck mass, [place]/AT  Cardiovascular: RRR, S1/S2, no rubs, no gallops  Respiratory: Diminished breath sounds on the left. Right basilar crackles. No wheezes or rhonchi.  Abdomen: Soft/+BS, non tender, non distended, no guarding  Extremities: No edema, No lymphangitis, No petechiae, No rashes, no synovitis  Data Reviewed: Basic Metabolic Panel:  Lab 09/16/12 1478 09/15/12 1739 09/15/12 1242  NA 137 -- 139  K 3.8 -- 4.4  CL 102 -- 104  CO2 23 -- 24  GLUCOSE 174* -- 85  BUN 14 -- 11  CREATININE 0.83 0.84 0.85  CALCIUM 9.2 -- 9.4  MG -- -- --  PHOS -- -- --   Liver Function Tests: No results found for this basename: AST:5,ALT:5,ALKPHOS:5,BILITOT:5,PROT:5,ALBUMIN:5 in the last 168 hours No results found for this basename: LIPASE:5,AMYLASE:5 in the last 168 hours No results found for this basename: AMMONIA:5 in the last 168 hours CBC:  Lab 09/16/12 0535 09/15/12 1739 09/15/12 1242  WBC 8.5 8.2 7.5  NEUTROABS -- -- 5.4  HGB 13.6 13.6 15.0  HCT 38.5* 38.1* 42.3  MCV 85.7 86.0 86.3  PLT 300 292 304   Cardiac Enzymes:  Lab 09/16/12 0535 09/15/12 2345 09/15/12 2110  CKTOTAL -- -- --  CKMB -- -- --  CKMBINDEX -- -- --  TROPONINI <0.30 <0.30 <0.30   BNP: No components found with this basename: POCBNP:5 CBG: No results found for this basename: GLUCAP:5 in the last 168 hours  No results found for this or any previous visit (from the past 240 hour(s)).   Scheduled Meds:   . albuterol  5 mg Nebulization Once  . amitriptyline  25 mg Oral QHS  . aspirin  324 mg Oral Once  . aspirin  325 mg Oral Daily  . atorvastatin  20 mg Oral  q1800  . carvedilol  3.125 mg Oral BID WC  . enoxaparin (LOVENOX) injection  40 mg Subcutaneous Q24H  . furosemide  40 mg Intravenous Once  . influenza  inactive virus vaccine  0.5 mL Intramuscular Tomorrow-1000  . ipratropium  0.5 mg Nebulization Once  . ipratropium  0.5 mg Nebulization Q6H  . levalbuterol  0.63 mg Nebulization Q6H  . levofloxacin (LEVAQUIN) IV  500 mg Intravenous Q24H  . methylPREDNISolone (SOLU-MEDROL) injection  60 mg Intravenous Q6H  . nicotine  21 mg Transdermal Daily  . pneumococcal 23 valent vaccine  0.5 mL Intramuscular Tomorrow-1000  . prasugrel  10 mg Oral Daily  . predniSONE  60 mg Oral Once  . ramipril  1.25 mg Oral Daily  . sodium chloride  3 mL Intravenous Q12H   Continuous Infusions:    Romaine Maciolek, DO  [facility] Pager [phone]  If 7PM-7AM, please contact night-coverage [email or web address] Password [email or web address] 09/16/2012, 8:37 AM   LOS: 1 day

## 2012-09-16 NOTE — ED Provider Notes (Signed)
Medical screening examination/treatment/procedure(s) were conducted as a shared visit with non-physician practitioner(s) and myself.  I personally evaluated the patient during the encounter  Doug Sou, MD 09/16/12 [phone]

## 2012-09-17 DIAGNOSIS — C3490 Malignant neoplasm of unspecified part of unspecified bronchus or lung: Secondary | ICD-10-CM

## 2012-09-17 LAB — BASIC METABOLIC PANEL
Calcium: 9.1 mg/dL (ref 8.4–10.5)
GFR calc Af Amer: >90 mL/min (ref >90)
GFR calc non Af Amer: >90 mL/min (ref >90)
Potassium: 4.1 mEq/L (ref 3.5–5.1)
Sodium: 136 mEq/L (ref 135–145)

## 2012-09-17 MED ORDER — DOCUSATE SODIUM 100 MG PO CAPS
100.0000 mg | ORAL_CAPSULE | Freq: Two times a day (BID) | ORAL | Status: DC
  Administered 2012-09-17 – 2012-09-18 (×2): 100 mg via ORAL
  Filled 2012-09-17 (×4): qty 1

## 2012-09-17 MED ORDER — GUAIFENESIN ER 600 MG PO TB12
600.0000 mg | ORAL_TABLET | Freq: Two times a day (BID) | ORAL | Status: DC
  Administered 2012-09-17 – 2012-09-18 (×3): 600 mg via ORAL
  Filled 2012-09-17 (×4): qty 1

## 2012-09-17 MED ORDER — PREDNISONE 50 MG PO TABS
50.0000 mg | ORAL_TABLET | Freq: Two times a day (BID) | ORAL | Status: DC
  Administered 2012-09-17 – 2012-09-18 (×3): 50 mg via ORAL
  Filled 2012-09-17 (×4): qty 1

## 2012-09-17 MED ORDER — SENNA 8.6 MG PO TABS
2.0000 | ORAL_TABLET | Freq: Every day | ORAL | Status: DC
  Administered 2012-09-17: 17.2 mg via ORAL
  Filled 2012-09-17 (×3): qty 2

## 2012-09-17 NOTE — Progress Notes (Signed)
Pt requesting Cardiology consult and cath. Informed MD TAT. Will order BMP at this time.

## 2012-09-17 NOTE — Consult Note (Signed)
Reason for Consult: Chest pain/CHF Referring Physician: [facility]  Ronald [name] is an 56 y.o. male.  HPI: Patient is 56 year old male with past medical history significant for very artery disease status post PTCA stenting to LAD in July of 2009, currently had PTCA stenting to left circumflex and ramus in May of 2013, ischemic cardiomyopathy with EF of 20-25%, hypertension, moderately severe mitral regurgitation, hypercholesteremia, history of recurrent congestive heart failure, hypercholesteremia, back abuse, cocaine abuse last use approximately one month ago, history of non-small cell CA of left lung stage III status post left pneumonectomy, right hilar adenopathy, he was admitted because of progressive increasing shortness of breath associated with vague left-sided chest pain radiating to left arm off and on for last few days. Denies any palpitation lightheadedness or syncope denies PND orthopnea or leg swelling. Patient was treated with bronchodilators and steroids for exacerbation of COPD with improvement in his symptoms. Patient does give history of feeling tired fatigue no energy and exertional dyspnea with minimal exertion lately. States has been smoking cocaine off and on as he is under a lot of stress.  Past Medical History  Diagnosis Date  . COPD (chronic obstructive pulmonary disease)   . CHF (congestive heart failure)   . lung ca dx'd 2005    chemo/xrt comp 2005, lung ca    Past Surgical History  Procedure Date  . Coronary angioplasty with stent placement   . Pneumonectomy 2005    left    Family History  Problem Relation Age of Onset  . Hypertension Other   . Diabetes Other     Social History:  reports that he has been smoking Cigarettes.  He has never used smokeless tobacco. He reports that he uses illicit drugs (Cocaine). He reports that he does not drink alcohol.  Allergies:  Allergies  Allergen Reactions  . Penicillins Itching    Medications: I have  reviewed the patient's current medications.  Results for orders placed during the hospital encounter of 09/15/12 (from the past 48 hour(s))  CBC     Status: Abnormal   Collection Time   09/15/12  5:39 PM      Component Value Range Comment   WBC 8.2  4.0 - 10.5 K/uL    RBC 4.43  4.22 - 5.81 MIL/uL    Hemoglobin 13.6  13.0 - 17.0 g/dL    HCT 95.2 (*) 84.1 - 52.0 %    MCV 86.0  78.0 - 100.0 fL    MCH 30.7  26.0 - 34.0 pg    MCHC 35.7  30.0 - 36.0 g/dL    RDW 32.4 (*) 40.1 - 15.5 %    Platelets 292  150 - 400 K/uL   CREATININE, SERUM     Status: Normal   Collection Time   09/15/12  5:39 PM      Component Value Range Comment   Creatinine, Ser 0.84  0.50 - 1.35 mg/dL    GFR calc non Af Amer >90  >90 mL/min    GFR calc Af Amer >90  >90 mL/min   TROPONIN I     Status: Normal   Collection Time   09/15/12  9:10 PM      Component Value Range Comment   Troponin I <0.30  <0.30 ng/mL   TROPONIN I     Status: Normal   Collection Time   09/15/12 11:45 PM      Component Value Range Comment   Troponin I <0.30  <0.30 ng/mL  URINE RAPID DRUG SCREEN (HOSP PERFORMED)     Status: Normal   Collection Time   09/16/12  2:42 AM      Component Value Range Comment   Opiates NONE DETECTED  NONE DETECTED    Cocaine NONE DETECTED  NONE DETECTED    Benzodiazepines NONE DETECTED  NONE DETECTED    Amphetamines NONE DETECTED  NONE DETECTED    Tetrahydrocannabinol NONE DETECTED  NONE DETECTED    Barbiturates NONE DETECTED  NONE DETECTED   BASIC METABOLIC PANEL     Status: Abnormal   Collection Time   09/16/12  5:35 AM      Component Value Range Comment   Sodium 137  135 - 145 mEq/L    Potassium 3.8  3.5 - 5.1 mEq/L    Chloride 102  96 - 112 mEq/L    CO2 23  19 - 32 mEq/L    Glucose, Bld 174 (*) 70 - 99 mg/dL    BUN 14  6 - 23 mg/dL    Creatinine, Ser 0.98  0.50 - 1.35 mg/dL    Calcium 9.2  8.4 - 11.9 mg/dL    GFR calc non Af Amer >90  >90 mL/min    GFR calc Af Amer >90  >90 mL/min   CBC     Status:  Abnormal   Collection Time   09/16/12  5:35 AM      Component Value Range Comment   WBC 8.5  4.0 - 10.5 K/uL    RBC 4.49  4.22 - 5.81 MIL/uL    Hemoglobin 13.6  13.0 - 17.0 g/dL    HCT 14.7 (*) 82.9 - 52.0 %    MCV 85.7  78.0 - 100.0 fL    MCH 30.3  26.0 - 34.0 pg    MCHC 35.3  30.0 - 36.0 g/dL    RDW 56.2 (*) 13.0 - 15.5 %    Platelets 300  150 - 400 K/uL   PRO B NATRIURETIC PEPTIDE     Status: Abnormal   Collection Time   09/16/12  5:35 AM      Component Value Range Comment   Pro B Natriuretic peptide (BNP) 3007.0 (*) 0 - 125 pg/mL   TROPONIN I     Status: Normal   Collection Time   09/16/12  5:35 AM      Component Value Range Comment   Troponin I <0.30  <0.30 ng/mL   CULTURE, RESPIRATORY     Status: Normal (Preliminary result)   Collection Time   09/16/12 11:36 AM      Component Value Range Comment   Specimen Description SPUTUM      Special Requests NONE      Gram Stain PENDING      Culture Culture reincubated for better growth      Report Status PENDING     CULTURE, EXPECTORATED SPUTUM-ASSESSMENT     Status: Normal   Collection Time   09/16/12 12:31 PM      Component Value Range Comment   Specimen Description SPUTUM      Special Requests NONE      Sputum evaluation        Value: THIS SPECIMEN IS ACCEPTABLE. RESPIRATORY CULTURE REPORT TO FOLLOW.   Report Status 09/16/2012 FINAL     BASIC METABOLIC PANEL     Status: Abnormal   Collection Time   09/17/12  6:40 AM      Component Value Range Comment   Sodium 136  135 -  145 mEq/L    Potassium 4.1  3.5 - 5.1 mEq/L    Chloride 101  96 - 112 mEq/L    CO2 25  19 - 32 mEq/L    Glucose, Bld 133 (*) 70 - 99 mg/dL    BUN 15  6 - 23 mg/dL    Creatinine, Ser 0.86  0.50 - 1.35 mg/dL    Calcium 9.1  8.4 - 57.8 mg/dL    GFR calc non Af Amer >90  >90 mL/min    GFR calc Af Amer >90  >90 mL/min     No results found.  Review of Systems  Constitutional: Negative for fever, chills and weight loss.  Respiratory: Positive for cough  and shortness of breath. Negative for hemoptysis and sputum production.   Cardiovascular: Positive for chest pain. Negative for palpitations, orthopnea, claudication and leg swelling.  Gastrointestinal: Negative for nausea, vomiting, abdominal pain and diarrhea.  Genitourinary: Negative for dysuria.  Neurological: Negative for dizziness.   Blood pressure 101/66, pulse 85, temperature 97.6 F (36.4 C), temperature source Oral, resp. rate 19, height 5' (1.524 m), weight 65.59 kg (144 lb 9.6 oz), SpO2 100.00%. Physical Exam  Constitutional: He is oriented to person, place, and time.  HENT:  Head: Normocephalic and atraumatic.  Eyes: Conjunctivae normal and EOM are normal. Left eye exhibits no discharge. No scleral icterus.  Neck: Normal range of motion. Neck supple. No JVD present. No tracheal deviation present. No thyromegaly present.  Cardiovascular: Normal rate and regular rhythm.  Exam reveals no friction rub.   Murmur (2/6 systolic murmur noted and soft S3 gallop noted) heard. Respiratory: Effort normal. He has no wheezes.       Decreased breath sounds left lung field Decrease vessel the right base  GI: Soft. Bowel sounds are normal. He exhibits no distension. There is no tenderness. There is no rebound.  Musculoskeletal: He exhibits no edema and no tenderness.  Neurological: He is alert and oriented to person, place, and time.    Assessment/Plan: Status post chest pain MI ruled out rule out restenosis Ischemic cardiomyopathy Valvular heart disease Moderately severe mitral regurgitation Resolving exacerbation of COPD Cocaine abuse Hypertension Hypercholesteremia History of tobacco abuse Positive family history of coronary artery disease History of non-small cell C. of left lung status post left pneumonectomy/right hilar adenopathy Plan Agree with present management  Wean off steroids Will schedule for nuclear stress test in a.m. to evaluate for  ischemia/restenosis  Miran Kautzman N 09/17/2012, 3:16 PM

## 2012-09-17 NOTE — Progress Notes (Signed)
Pt requesting Mrilax. Page MD. Levin Bacon and senkt ordered.

## 2012-09-17 NOTE — Progress Notes (Signed)
[facility] PROGRESS NOTE  Ronald [name] ZOX:[id number] DOB: [birth year] DOA: 09/15/2012 PCP: Jeri Cos, MD  Assessment/Plan: COPD exacerbation  -Change IV segmental to by mouth prednisone -Sputum Gram stain and culture  -Pulmonary toilet  -Continue duo nebs  -Continue Levaquin, switched to oral  Tobacco abuse  -Patient continues to smoke -Tobacco cessation discussed  -Continue Nicoderm patch  Pulmonary arterial hypertension  -Likely contributing to the patient's elevated proBNP  -Clinically does not appear to be fluid overloaded  Ischemic cardiomyopathy  -04/06/2012 echocardiogram ejection fraction 30-35%  -Continue ramipril -04/07/12 heart catheterization with placement of 2 drug-eluting stents -Continue aspirin and Effient, coreg -patient requested Dr. Sharyn Lull to come see him-->I placed the consult -Troponins and EKGs do not suggest acute coronary syndrome       Disposition Plan:   Home on 09/18/12 (monday if stable)   Antibiotics:  Levaquin November 1>>>     Procedures/Studies: Dg Chest 2 View  09/15/2012  *RADIOLOGY REPORT*  Clinical Data: 56 year old male with shortness of breath cough weakness and history of lung cancer.  CHEST - 2 VIEW  Comparison: 04/06/2012 and earlier.  Findings: Sequelae of left pneumonectomy.  Stable mediastinal contours.  Stable increased interstitial markings throughout the right lung.  No discrete pulmonary mass or nodule. Chronic/postoperative osseous changes to the left ribs. Small volume of gas probably in the thoracic esophagus on the left. Negative visualized bowel gas pattern.  IMPRESSION: Stable appearance of the chest status post left pneumonectomy.  No acute cardiopulmonary abnormality identified.   Original Report Authenticated By: Erskine Speed, M.D.          Subjective: Patient denies any fevers, chills, chest pain, shortness breath, nausea, vomiting, diarrhea, abdominal pain, headaches,  dizziness.  Objective: Filed Vitals:   09/17/12 0200 09/17/12 0443 09/17/12 0820 09/17/12 1033  BP:  99/54  100/76  Pulse:  85  90  Temp:  98.1 F (36.7 C)    TempSrc:  Oral    Resp:  19  19  Height:      Weight:  65.59 kg (144 lb 9.6 oz)    SpO2: 97% 96% 96% 9%    Intake/Output Summary (Last 24 hours) at 09/17/12 1316 Last data filed at 09/17/12 1001  Gross per 24 hour  Intake   1300 ml  Output   2275 ml  Net   -975 ml   Weight change: -0.81 kg (-1 lb 12.6 oz) Exam:   General:  Pt is alert, follows commands appropriately, not in acute distress  HEENT: No icterus, No thrush,  Flushing/AT  Cardiovascular: RRR, S1/S2, no rubs, no gallops  Respiratory: Diminished breath sounds in the left. But clear to auscultation. No wheezes or rhonchi.  Abdomen: Soft/+BS, non tender, non distended, no guarding  Extremities: No edema, No lymphangitis, No petechiae, No rashes, no synovitis  Data Reviewed: Basic Metabolic Panel:  Lab 09/17/12 8119 09/16/12 0535 09/15/12 1739 09/15/12 1242  NA 136 137 -- 139  K 4.1 3.8 -- 4.4  CL 101 102 -- 104  CO2 25 23 -- 24  GLUCOSE 133* 174* -- 85  BUN 15 14 -- 11  CREATININE 0.83 0.83 0.84 0.85  CALCIUM 9.1 9.2 -- 9.4  MG -- -- -- --  PHOS -- -- -- --   Liver Function Tests: No results found for this basename: AST:5,ALT:5,ALKPHOS:5,BILITOT:5,PROT:5,ALBUMIN:5 in the last 168 hours No results found for this basename: LIPASE:5,AMYLASE:5 in the last 168 hours No results found for this basename: AMMONIA:5 in the last 168  hours CBC:  Lab 09/16/12 0535 09/15/12 1739 09/15/12 1242  WBC 8.5 8.2 7.5  NEUTROABS -- -- 5.4  HGB 13.6 13.6 15.0  HCT 38.5* 38.1* 42.3  MCV 85.7 86.0 86.3  PLT 300 292 304   Cardiac Enzymes:  Lab 09/16/12 0535 09/15/12 2345 09/15/12 2110  CKTOTAL -- -- --  CKMB -- -- --  CKMBINDEX -- -- --  TROPONINI <0.30 <0.30 <0.30   BNP: No components found with this basename: POCBNP:5 CBG: No results found for this  basename: GLUCAP:5 in the last 168 hours  Recent Results (from the past 240 hour(s))  CULTURE, RESPIRATORY     Status: Normal (Preliminary result)   Collection Time   09/16/12 11:36 AM      Component Value Range Status Comment   Specimen Description SPUTUM   Final    Special Requests NONE   Final    Gram Stain PENDING   Incomplete    Culture Culture reincubated for better growth   Final    Report Status PENDING   Incomplete   CULTURE, EXPECTORATED SPUTUM-ASSESSMENT     Status: Normal   Collection Time   09/16/12 12:31 PM      Component Value Range Status Comment   Specimen Description SPUTUM   Final    Special Requests NONE   Final    Sputum evaluation     Final    Value: THIS SPECIMEN IS ACCEPTABLE. RESPIRATORY CULTURE REPORT TO FOLLOW.   Report Status 09/16/2012 FINAL   Final      Scheduled Meds:   . amitriptyline  25 mg Oral QHS  . aspirin  325 mg Oral Daily  . atorvastatin  20 mg Oral q1800  . carvedilol  3.125 mg Oral BID WC  . enoxaparin (LOVENOX) injection  40 mg Subcutaneous Q24H  . guaiFENesin  600 mg Oral BID  . ipratropium  0.5 mg Nebulization Q6H  . levalbuterol  0.63 mg Nebulization Q6H  . levofloxacin  750 mg Oral Q2000  . methylPREDNISolone (SOLU-MEDROL) injection  60 mg Intravenous Q6H  . nicotine  21 mg Transdermal Daily  . prasugrel  10 mg Oral Daily  . ramipril  1.25 mg Oral Daily  . sodium chloride  3 mL Intravenous Q12H   Continuous Infusions:    Ronald Grunden, DO  [facility] Pager [phone]  If 7PM-7AM, please contact night-coverage [email or web address] Password [email or web address] 09/17/2012, 1:16 PM   LOS: 2 days

## 2012-09-17 NOTE — Care Management Note (Signed)
    Page 1 of 2   09/17/2012     10:16:19 AM   CARE MANAGEMENT NOTE 09/17/2012  Patient:  Ronald [name], Ronald [name]   Account Number:  [id number]  Date Initiated:  09/17/2012  Documentation initiated by:  GRAVES-BIGELOW,Johnluke Haugen  Subjective/Objective Assessment:   Pt admitted with shortness of breath since one week-hx COPD.Pt lives alone.     Action/Plan:   CM discussed H/H servcies with pt and he is agreeable to [facility] for Disease Management. Pt chose [facility] for services. Pt will need order for [facility] services. CM will contact MD for order/ f55f.   Anticipated DC Date:  09/19/2012   Anticipated DC Plan:  HOME W HOME HEALTH SERVICES      DC Planning Services  CM consult      [facility] Choice  HOME HEALTH   Choice offered to / List presented to:  C-1 Patient        HH arranged  HH-1 RN  HH-10 DISEASE MANAGEMENT      HH agency  [facility]   Status of service:  Completed, signed off Medicare Important Message given?   (If response is "NO", the following Medicare IM given date fields will be blank) Date Medicare IM given:   Date Additional Medicare IM given:    Discharge Disposition:  HOME W HOME HEALTH SERVICES  Per UR Regulation:  Reviewed for med. necessity/level of care/duration of stay  If discussed at Long Length of Stay Meetings, dates discussed:    Comments:  09-27-12 [address] [place] [phone] CM received order for [facility] and CM will make referral for services. SOC to begin within 24-48 hours post d/c.   09-17-12 0931 Tomi Bamberger, RN,BSN [phone]  Amion Message was sent to MD for orders for [facility]. Pt states he has an Engineer, production from [facility] [employer] 7 days a wk 3 hrs a day. CM will make referral once order has been written. No further needs at this time.

## 2012-09-18 ENCOUNTER — Inpatient Hospital Stay (HOSPITAL_COMMUNITY): Payer: Medicare Other

## 2012-09-18 DIAGNOSIS — F1410 Cocaine abuse, uncomplicated: Secondary | ICD-10-CM

## 2012-09-18 LAB — BASIC METABOLIC PANEL
BUN: 18 mg/dL (ref 6–23)
Chloride: 101 mEq/L (ref 96–112)
GFR calc Af Amer: >90 mL/min (ref >90)
Potassium: 4 mEq/L (ref 3.5–5.1)

## 2012-09-18 MED ORDER — LEVOFLOXACIN 750 MG PO TABS: 750.0000 mg | ORAL_TABLET | Freq: Every day | ORAL | Status: DC

## 2012-09-18 MED ORDER — FLUTICASONE-SALMETEROL 250-50 MCG/DOSE IN AEPB: 1.0000 | INHALATION_SPRAY | Freq: Two times a day (BID) | RESPIRATORY_TRACT | Status: DC

## 2012-09-18 MED ORDER — TECHNETIUM TC 99M SESTAMIBI GENERIC - CARDIOLITE
10.0000 | Freq: Once | INTRAVENOUS | Status: AC | PRN
  Administered 2012-09-18: 10 via INTRAVENOUS

## 2012-09-18 MED ORDER — PREDNISONE 10 MG PO TABS: ORAL_TABLET | ORAL | Status: DC

## 2012-09-18 MED ORDER — REGADENOSON 0.4 MG/5ML IV SOLN
0.4000 mg | Freq: Once | INTRAVENOUS | Status: AC
  Administered 2012-09-18: 0.4 mg via INTRAVENOUS
  Filled 2012-09-18: qty 5

## 2012-09-18 MED ORDER — TECHNETIUM TC 99M SESTAMIBI GENERIC - CARDIOLITE
30.0000 | Freq: Once | INTRAVENOUS | Status: AC | PRN
  Administered 2012-09-18: 30 via INTRAVENOUS

## 2012-09-18 NOTE — Discharge Summary (Signed)
Physician Discharge Summary  Ronald [name] QIO:[id number] DOB: 1956-09-17 DOA: 09/15/2012  PCP: Jeri Cos, MD  Admit date: 09/15/2012 Discharge date: 09/18/2012  Recommendations for Outpatient Follow-up:  1. Pt will need to follow up with PCP in 2 weeks post discharge 2. Follow up Dr. Sharyn Lull in 2 weeks  Discharge Diagnoses:  COPD exacerbation  -continue prednisone wean over the week -Sputum Gram stain and culture -->flora -Pulmonary toilet  -start advair -Continue Levaquin x 3 days Tobacco abuse  -Patient continues to smoke  -Tobacco cessation discussed  Pulmonary arterial hypertension  -Likely contributing to the patient's elevated proBNP  -Clinically does not appear to be fluid overloaded  Ischemic cardiomyopathy  -04/06/2012 echocardiogram ejection fraction 30-35%  -Continue ramipril  -04/07/12 heart catheterization with placement of 2 drug-eluting stents  -Continue aspirin and Effient, coreg  -Troponins and EKGs do not suggest acute coronary syndrome -myoview 09/18/12 negative, EF 53%a   Discharge Condition: Stable  Disposition:  discharge home  Diet: Cardiac diet Wt Readings from Last 3 Encounters:  09/18/12 65.1 kg (143 lb 8.3 oz)  04/08/12 66.2 kg (145 lb 15.1 oz)  04/08/12 66.2 kg (145 lb 15.1 oz)    History of present illness:  56 y.o. male with h/o COPD , ischemic cardiomyopathy, with Chronic systolic heart failure, not on diuretics, lung cancer s/p pneumonectomy, still smoking comes in for sob since one week, associated with cough with brown sputum, no fever or chills, orthopnea, no pnd, or chest pain. He was found to be diffusely wheezing and his CXR in ED does not show pulm edema or pneumonia but his pro bnp is elevated.    Hospital Course:  After the patient was admitted to the hospital, the patient was started on intravenous Solu-Medrol. The patient gradually improved. His respiratory status improved. Did not have any respiratory distress. His  Solu-Medrol was deescalated to po prednisone. The patient was started on Levaquin and Xopenex and Atrovent nebulizers which he tolerated well. The patient was placed on a Nicoderm patch. Unfortunately, the patient experienced some chest discomfort. Surgical tendons and EKGs were negative for acute coronary syndrome. Because of the patient's history of coronary disease with 2 previous drug-eluting stents in May 2013, his cardiologist, Dr. Sharyn Lull was consulted. A Myoview stress test was ordered. This was negative for any reversible ischemia and showed an ejection fraction of 53%. The patient had a elevated proBNP, but he was not fluid overloaded. He remained euvolemic throughout the hospitalization. Urine toxicology was negative. Sputum culture showed normal respiratory flora. The patient will be discharged home with a prednisone wean over the next week and 3 additional days of levofloxacin, making a total of 7 days of antibiotics. Smoking cessation was discussed. In addition, the patient was sent home on Advair for his COPD.   Consultants: Dr. Sharyn Lull  Discharge Exam: Filed Vitals:   09/18/12 1419  BP: 106/74  Pulse: 82  Temp: 97.8 F (36.6 C)  Resp: 20   Filed Vitals:   09/18/12 0938 09/18/12 1150 09/18/12 1419 09/18/12 1458  BP: 99/75 109/72 106/74   Pulse:   82   Temp:   97.8 F (36.6 C)   TempSrc:   Oral   Resp:  18 20   Height:      Weight:      SpO2:  97% 97% 97%   General: A&O x 3, NAD, pleasant, cooperative Cardiovascular: RRR, no rub, no gallop, no S3 Respiratory: Diminished breath sounds left lung. Bibasilar rales. No wheezes or rhonchi.  Abdomen:soft, nontender, nondistended, positive bowel sounds Extremities: No edema, No lymphangitis, no petechiae  Discharge Instructions  Discharge Orders    Future Orders Please Complete By Expires   Diet - low sodium heart healthy      Increase activity slowly      Discharge instructions      Comments:   Take prednisone 60mg  (6  tablets) starting tomorrow, then decrease by one tablet each day until gone.       Medication List     As of 09/18/2012  4:55 PM    TAKE these medications         amitriptyline 25 MG tablet   Commonly known as: ELAVIL   Take 25 mg by mouth at bedtime.      aspirin 325 MG tablet   Take 325 mg by mouth daily.      atorvastatin 20 MG tablet   Commonly known as: LIPITOR   Take 20 mg by mouth daily.      carvedilol 3.125 MG tablet   Commonly known as: COREG   Take 3.125 mg by mouth 2 (two) times daily with a meal.      Fluticasone-Salmeterol 250-50 MCG/DOSE Aepb   Commonly known as: ADVAIR   Inhale 1 puff into the lungs 2 (two) times daily.      levofloxacin 750 MG tablet   Commonly known as: LEVAQUIN   Take 1 tablet (750 mg total) by mouth daily at 8 pm.      oxyCODONE-acetaminophen 7.5-325 MG per tablet   Commonly known as: PERCOCET   Take 1 tablet by mouth 2 (two) times daily as needed. For pain      prasugrel 10 MG Tabs   Commonly known as: EFFIENT   Take 10 mg by mouth daily.      predniSONE 10 MG tablet   Commonly known as: DELTASONE   Take 60mg  (6 tablets) on day 1 and decrease by one tablet each day until gone      ramipril 1.25 MG capsule   Commonly known as: ALTACE   Take 1.25 mg by mouth daily.      zolpidem 5 MG tablet   Commonly known as: AMBIEN   Take 5 mg by mouth at bedtime as needed. For sleep         The results of significant diagnostics from this hospitalization (including imaging, microbiology, ancillary and laboratory) are listed below for reference.    Significant Diagnostic Studies: Dg Chest 2 View  09/15/2012  *RADIOLOGY REPORT*  Clinical Data: 56 year old male with shortness of breath cough weakness and history of lung cancer.  CHEST - 2 VIEW  Comparison: 04/06/2012 and earlier.  Findings: Sequelae of left pneumonectomy.  Stable mediastinal contours.  Stable increased interstitial markings throughout the right lung.  No discrete  pulmonary mass or nodule. Chronic/postoperative osseous changes to the left ribs. Small volume of gas probably in the thoracic esophagus on the left. Negative visualized bowel gas pattern.  IMPRESSION: Stable appearance of the chest status post left pneumonectomy.  No acute cardiopulmonary abnormality identified.   Original Report Authenticated By: Erskine Speed, M.D.    Nm Myocar Multi W/spect W/wall Motion / Ef  09/18/2012  *RADIOLOGY REPORT*  Clinical Data:  Shortness of breath, COPD, lung cancer, and tobacco use.  Chest pain.  MYOCARDIAL IMAGING WITH SPECT (REST AND PHARMACOLOGIC-STRESS) GATED LEFT VENTRICULAR WALL MOTION STUDY LEFT VENTRICULAR EJECTION FRACTION  Technique:  Standard myocardial SPECT imaging was performed after resting intravenous injection of  10 mCi Tc-67m sestamibi. Subsequently, intravenous infusion of lexiscan was performed under the supervision of the Cardiology staff.  At peak effect of the drug, 30 mCi Tc-64m sestamibi was injected intravenously and standard myocardial SPECT  imaging was performed.  Quantitative gated imaging was also performed to evaluate left ventricular wall motion, and estimate left ventricular ejection fraction.  Comparison:  Two-view chest 09/15/2012.  Nuclear medicine stress test to 12/24/2011.  Findings: A large anterolateral wall defect is again noted.  This appears fixed without significant reversibility.  The anterolateral wall defect does appear slightly larger than on the prior exams.  Left ventricular dilation left jugular dilation is stable.  Wall motion:  Lateral wall hypokinesis is again noted.  There is some dyskinesia of the apical segment.  There is also mild hypokinesis along the base of the septum.  Left ventricular ejection fraction.  Calculated ejection fraction is 53%, stable from prior exam.  IMPRESSION:  1.  No evidence for significant reversibility. 2.  Large inferolateral wall fixed defect with hypokinesis may be slightly larger than on the  prior exam. 3.  Mild hypokinesis along the base of the septum. 4.  Stable ejection fraction of 53%.   Original Report Authenticated By: Marin Roberts, M.D.      Microbiology: Recent Results (from the past 240 hour(s))  CULTURE, RESPIRATORY     Status: Normal (Preliminary result)   Collection Time   09/16/12 11:36 AM      Component Value Range Status Comment   Specimen Description SPUTUM   Final    Special Requests NONE   Final    Gram Stain     Final    Value: ABUNDANT WBC PRESENT, PREDOMINANTLY PMN     RARE SQUAMOUS EPITHELIAL CELLS PRESENT     RARE GRAM POSITIVE COCCI IN PAIRS   Culture NORMAL OROPHARYNGEAL FLORA   Final    Report Status PENDING   Incomplete   CULTURE, EXPECTORATED SPUTUM-ASSESSMENT     Status: Normal   Collection Time   09/16/12 12:31 PM      Component Value Range Status Comment   Specimen Description SPUTUM   Final    Special Requests NONE   Final    Sputum evaluation     Final    Value: THIS SPECIMEN IS ACCEPTABLE. RESPIRATORY CULTURE REPORT TO FOLLOW.   Report Status 09/16/2012 FINAL   Final      Labs: Basic Metabolic Panel:  Lab 09/18/12 1610 09/17/12 0640 09/16/12 0535 09/15/12 1739 09/15/12 1242  NA 137 136 137 -- 139  K 4.0 4.1 -- -- --  CL 101 101 102 -- 104  CO2 28 25 23  -- 24  GLUCOSE 97 133* 174* -- 85  BUN 18 15 14  -- 11  CREATININE 0.90 0.83 0.83 0.84 0.85  CALCIUM 8.9 9.1 9.2 -- 9.4  MG -- -- -- -- --  PHOS -- -- -- -- --   Liver Function Tests: No results found for this basename: AST:5,ALT:5,ALKPHOS:5,BILITOT:5,PROT:5,ALBUMIN:5 in the last 168 hours No results found for this basename: LIPASE:5,AMYLASE:5 in the last 168 hours No results found for this basename: AMMONIA:5 in the last 168 hours CBC:  Lab 09/16/12 0535 09/15/12 1739 09/15/12 1242  WBC 8.5 8.2 7.5  NEUTROABS -- -- 5.4  HGB 13.6 13.6 15.0  HCT 38.5* 38.1* 42.3  MCV 85.7 86.0 86.3  PLT 300 292 304   Cardiac Enzymes:  Lab 09/16/12 0535 09/15/12 2345 09/15/12  2110  CKTOTAL -- -- --  CKMB -- -- --  CKMBINDEX -- -- --  TROPONINI <0.30 <0.30 <0.30   BNP: No components found with this basename: POCBNP:5 CBG: No results found for this basename: GLUCAP:5 in the last 168 hours  Time coordinating discharge:  Greater than 30 minutes  Signed:  Anish Vana, DO [facility] Pager: [phone] 09/18/2012, 4:55 PM

## 2012-09-18 NOTE — Progress Notes (Signed)
Subjective:  Patient denies any further chest pain states breathing is improved  Objective:  Vital Signs in the last 24 hours: Temp:  [97.1 F (36.2 C)-98 F (36.7 C)] 97.1 F (36.2 C) (11/04 0425) Pulse Rate:  [72-85] 78  (11/04 0425) Resp:  [19-20] 20  (11/04 0425) BP: (87-105)/(49-76) 99/75 mmHg (11/04 0938) SpO2:  [93 %-100 %] 98 % (11/04 0425) Weight:  [65.1 kg (143 lb 8.3 oz)] 65.1 kg (143 lb 8.3 oz) (11/04 0425)  Intake/Output from previous day: 11/03 0701 - 11/04 0700 In: 1180 [P.O.:1180] Out: 1850 [Urine:1850] Intake/Output from this shift: Total I/O In: -  Out: 300 [Urine:300]  Physical Exam: Neck: no adenopathy, no carotid bruit, no JVD and supple, symmetrical, trachea midline Lungs: Decreased breath sounds left lung field and right base Heart: regular rate and rhythm, S1, S2 normal and 2/6 systolic murmur noted Abdomen: soft, non-tender; bowel sounds normal; no masses,  no organomegaly Extremities: extremities normal, atraumatic, no cyanosis or edema  Lab Results:  Basename 09/16/12 0535 09/15/12 1739  WBC 8.5 8.2  HGB 13.6 13.6  PLT 300 292    Basename 09/18/12 0445 09/17/12 0640  NA 137 136  K 4.0 4.1  CL 101 101  CO2 28 25  GLUCOSE 97 133*  BUN 18 15  CREATININE 0.90 0.83    Basename 09/16/12 0535 09/15/12 2345  TROPONINI <0.30 <0.30   Hepatic Function Panel No results found for this basename: PROT,ALBUMIN,AST,ALT,ALKPHOS,BILITOT,BILIDIR,IBILI in the last 72 hours No results found for this basename: CHOL in the last 72 hours No results found for this basename: PROTIME in the last 72 hours  Imaging: Imaging results have been reviewed and No results found.  Cardiac Studies:  Assessment/Plan:  Status post chest pain MI ruled out rule out restenosis  Ischemic cardiomyopathy  Valvular heart disease  Moderately severe mitral regurgitation  Resolving exacerbation of COPD  Cocaine abuse  Hypertension  Hypercholesteremia  History of tobacco  abuse  Positive family history of coronary artery disease  History of non-small cell Ca. of left lung status post left pneumonectomy/right hilar adenopathy  Plan Schedule for nuclear stress test today  LOS: 3 days    Ronald [name] N 09/18/2012, 11:46 AM

## 2012-09-19 LAB — CULTURE, RESPIRATORY W GRAM STAIN: Culture: NORMAL

## 2012-11-30 ENCOUNTER — Emergency Department (HOSPITAL_COMMUNITY): Payer: Medicare Other

## 2012-11-30 ENCOUNTER — Inpatient Hospital Stay (HOSPITAL_COMMUNITY)
Admission: EM | Admit: 2012-11-30 | Discharge: 2012-12-06 | DRG: 190 | Disposition: A | Discharge status: Home / Self Care | Payer: Medicare Other | Attending: Cardiology | Admitting: Cardiology

## 2012-11-30 ENCOUNTER — Encounter (HOSPITAL_COMMUNITY): Payer: Self-pay | Admitting: Emergency Medicine

## 2012-11-30 DIAGNOSIS — I10 Essential (primary) hypertension: Secondary | ICD-10-CM | POA: Diagnosis present

## 2012-11-30 DIAGNOSIS — I2589 Other forms of chronic ischemic heart disease: Secondary | ICD-10-CM | POA: Diagnosis present

## 2012-11-30 DIAGNOSIS — Z9861 Coronary angioplasty status: Secondary | ICD-10-CM

## 2012-11-30 DIAGNOSIS — F17200 Nicotine dependence, unspecified, uncomplicated: Secondary | ICD-10-CM | POA: Diagnosis present

## 2012-11-30 DIAGNOSIS — I5021 Acute systolic (congestive) heart failure: Secondary | ICD-10-CM | POA: Diagnosis present

## 2012-11-30 DIAGNOSIS — Z85118 Personal history of other malignant neoplasm of bronchus and lung: Secondary | ICD-10-CM

## 2012-11-30 DIAGNOSIS — E7800 Pure hypercholesterolemia, unspecified: Secondary | ICD-10-CM | POA: Diagnosis present

## 2012-11-30 DIAGNOSIS — J441 Chronic obstructive pulmonary disease with (acute) exacerbation: Principal | ICD-10-CM | POA: Diagnosis present

## 2012-11-30 DIAGNOSIS — I2510 Atherosclerotic heart disease of native coronary artery without angina pectoris: Secondary | ICD-10-CM | POA: Diagnosis present

## 2012-11-30 DIAGNOSIS — I059 Rheumatic mitral valve disease, unspecified: Secondary | ICD-10-CM | POA: Diagnosis present

## 2012-11-30 DIAGNOSIS — R0600 Dyspnea, unspecified: Secondary | ICD-10-CM

## 2012-11-30 LAB — COMPREHENSIVE METABOLIC PANEL
ALT: 26 U/L (ref 0–53)
AST: 33 U/L (ref 0–37)
Albumin: 2.9 g/dL — ABNORMAL LOW (ref 3.5–5.2)
Alkaline Phosphatase: 109 U/L (ref 39–117)
BUN: 14 mg/dL (ref 6–23)
CO2: 25 mEq/L (ref 19–32)
Calcium: 8.7 mg/dL (ref 8.4–10.5)
Chloride: 105 mEq/L (ref 96–112)
Creatinine, Ser: 0.83 mg/dL (ref 0.50–1.35)
GFR calc Af Amer: >90 mL/min (ref >90)
GFR calc non Af Amer: >90 mL/min (ref >90)
Glucose, Bld: 108 mg/dL — ABNORMAL HIGH (ref 70–99)
Potassium: 3.8 mEq/L (ref 3.5–5.1)
Sodium: 139 mEq/L (ref 135–145)
Total Bilirubin: 0.3 mg/dL (ref 0.3–1.2)
Total Protein: 6.8 g/dL (ref 6.0–8.3)

## 2012-11-30 LAB — CBC
HCT: 37.6 % — ABNORMAL LOW (ref 39.0–52.0)
Hemoglobin: 13.1 g/dL (ref 13.0–17.0)
MCH: 30.3 pg (ref 26.0–34.0)
MCHC: 34.8 g/dL (ref 30.0–36.0)
MCV: 87 fL (ref 78.0–100.0)
Platelets: 297 10*3/uL (ref 150–400)
RBC: 4.32 MIL/uL (ref 4.22–5.81)
RDW: 15.5 % (ref 11.5–15.5)
WBC: 7.8 10*3/uL (ref 4.0–10.5)

## 2012-11-30 LAB — PRO B NATRIURETIC PEPTIDE: Pro B Natriuretic peptide (BNP): 1901 pg/mL — ABNORMAL HIGH (ref 0–125)

## 2012-11-30 LAB — TROPONIN I: Troponin I: <0.3 ng/mL (ref <0.30)

## 2012-11-30 MED ORDER — AMITRIPTYLINE HCL 25 MG PO TABS
25.0000 mg | ORAL_TABLET | Freq: Every day | ORAL | Status: DC
  Administered 2012-12-01 – 2012-12-05 (×6): 25 mg via ORAL
  Filled 2012-11-30 (×7): qty 1

## 2012-11-30 MED ORDER — METHYLPREDNISOLONE SODIUM SUCC 125 MG IJ SOLR
125.0000 mg | Freq: Once | INTRAMUSCULAR | Status: AC
  Administered 2012-11-30: 125 mg via INTRAVENOUS
  Filled 2012-11-30: qty 2

## 2012-11-30 MED ORDER — METHYLPREDNISOLONE SODIUM SUCC 125 MG IJ SOLR
60.0000 mg | Freq: Four times a day (QID) | INTRAMUSCULAR | Status: DC
  Administered 2012-12-01 (×3): 60 mg via INTRAVENOUS
  Filled 2012-11-30 (×5): qty 0.96

## 2012-11-30 MED ORDER — ACETAMINOPHEN 325 MG PO TABS
650.0000 mg | ORAL_TABLET | ORAL | Status: DC | PRN
  Administered 2012-12-02: 650 mg via ORAL
  Filled 2012-11-30: qty 2

## 2012-11-30 MED ORDER — SODIUM CHLORIDE 0.9 % IV SOLN: 250.0000 mL | INTRAVENOUS | Status: DC | PRN

## 2012-11-30 MED ORDER — SODIUM CHLORIDE 0.9 % IJ SOLN: 3.0000 mL | INTRAMUSCULAR | Status: DC | PRN

## 2012-11-30 MED ORDER — FUROSEMIDE 10 MG/ML IJ SOLN
40.0000 mg | Freq: Every day | INTRAMUSCULAR | Status: DC
  Administered 2012-12-01 – 2012-12-02 (×2): 40 mg via INTRAVENOUS
  Filled 2012-11-30 (×2): qty 4

## 2012-11-30 MED ORDER — FUROSEMIDE 10 MG/ML IJ SOLN
40.0000 mg | Freq: Once | INTRAMUSCULAR | Status: AC
  Administered 2012-11-30: 40 mg via INTRAVENOUS
  Filled 2012-11-30: qty 4

## 2012-11-30 MED ORDER — ASPIRIN EC 81 MG PO TBEC
81.0000 mg | DELAYED_RELEASE_TABLET | Freq: Every day | ORAL | Status: DC
  Administered 2012-12-01 – 2012-12-06 (×6): 81 mg via ORAL
  Filled 2012-11-30 (×6): qty 1

## 2012-11-30 MED ORDER — PRASUGREL HCL 10 MG PO TABS
10.0000 mg | ORAL_TABLET | Freq: Every day | ORAL | Status: DC
  Administered 2012-12-01 – 2012-12-06 (×6): 10 mg via ORAL
  Filled 2012-11-30 (×6): qty 1

## 2012-11-30 MED ORDER — ATORVASTATIN CALCIUM 20 MG PO TABS
20.0000 mg | ORAL_TABLET | Freq: Every day | ORAL | Status: DC
  Administered 2012-12-01 – 2012-12-06 (×6): 20 mg via ORAL
  Filled 2012-11-30 (×6): qty 1

## 2012-11-30 MED ORDER — RAMIPRIL 1.25 MG PO CAPS
1.2500 mg | ORAL_CAPSULE | Freq: Every day | ORAL | Status: DC
  Administered 2012-12-01 – 2012-12-04 (×4): 1.25 mg via ORAL
  Filled 2012-11-30 (×6): qty 1

## 2012-11-30 MED ORDER — IPRATROPIUM BROMIDE 0.02 % IN SOLN
0.5000 mg | Freq: Once | RESPIRATORY_TRACT | Status: AC
  Administered 2012-11-30: 0.5 mg via RESPIRATORY_TRACT
  Filled 2012-11-30: qty 2.5

## 2012-11-30 MED ORDER — POTASSIUM CHLORIDE CRYS ER 20 MEQ PO TBCR
20.0000 meq | EXTENDED_RELEASE_TABLET | Freq: Every day | ORAL | Status: DC
  Administered 2012-12-01 – 2012-12-06 (×6): 20 meq via ORAL
  Filled 2012-11-30 (×6): qty 1

## 2012-11-30 MED ORDER — ALBUTEROL (5 MG/ML) CONTINUOUS INHALATION SOLN
15.0000 mg | INHALATION_SOLUTION | Freq: Once | RESPIRATORY_TRACT | Status: AC
  Administered 2012-11-30: 15 mg via RESPIRATORY_TRACT
  Filled 2012-11-30: qty 20

## 2012-11-30 MED ORDER — ONDANSETRON HCL 4 MG/2ML IJ SOLN: 4.0000 mg | Freq: Four times a day (QID) | INTRAMUSCULAR | Status: DC | PRN

## 2012-11-30 MED ORDER — PANTOPRAZOLE SODIUM 40 MG PO TBEC
40.0000 mg | DELAYED_RELEASE_TABLET | Freq: Every day | ORAL | Status: DC
  Administered 2012-12-01 – 2012-12-06 (×6): 40 mg via ORAL
  Filled 2012-11-30 (×6): qty 1

## 2012-11-30 MED ORDER — SODIUM CHLORIDE 0.9 % IJ SOLN
3.0000 mL | Freq: Two times a day (BID) | INTRAMUSCULAR | Status: DC
  Administered 2012-12-01 – 2012-12-06 (×11): 3 mL via INTRAVENOUS

## 2012-11-30 MED ORDER — HEPARIN SODIUM (PORCINE) 5000 UNIT/ML IJ SOLN
5000.0000 [IU] | Freq: Three times a day (TID) | INTRAMUSCULAR | Status: DC
  Administered 2012-12-01 – 2012-12-06 (×17): 5000 [IU] via SUBCUTANEOUS
  Filled 2012-11-30 (×20): qty 1

## 2012-11-30 MED ORDER — LEVALBUTEROL HCL 1.25 MG/0.5ML IN NEBU
1.2500 mg | INHALATION_SOLUTION | Freq: Four times a day (QID) | RESPIRATORY_TRACT | Status: DC | PRN
  Filled 2012-11-30: qty 0.5

## 2012-11-30 MED ORDER — ALBUTEROL SULFATE (5 MG/ML) 0.5% IN NEBU
5.0000 mg | INHALATION_SOLUTION | Freq: Once | RESPIRATORY_TRACT | Status: AC
  Administered 2012-11-30: 5 mg via RESPIRATORY_TRACT
  Filled 2012-11-30: qty 1

## 2012-11-30 MED ORDER — CARVEDILOL 3.125 MG PO TABS
3.1250 mg | ORAL_TABLET | Freq: Two times a day (BID) | ORAL | Status: DC
  Administered 2012-12-01 – 2012-12-06 (×9): 3.125 mg via ORAL
  Filled 2012-11-30 (×14): qty 1

## 2012-11-30 NOTE — ED Notes (Signed)
Sob x 2 days wheezing badley states only has 1 lung

## 2012-11-30 NOTE — ED Notes (Signed)
Just had ultrasound  today and was told he had fluid around heart only has rt lung hurting on rt side of chest

## 2012-11-30 NOTE — ED Notes (Signed)
Patient ambulated to restroom and became extremely short of breath. O2 sats 75% on RA. Patient placed in bed and put on 100% NRB until sats greater than 95%. Patient is now on 4L [place] and sats are 100%.

## 2012-11-30 NOTE — H&P (Signed)
Ronald [name] is an 57 y.o. male.   Chief Complaint: Progressive shortness of breath associated with HPI: Patient is 57 year old male with past medical history significant for multiple medical problems i.e. coronary artery disease status post PTCA stenting to LAD in July of 2009 subsequently had PTCA stenting to left circumflex and ramus in 2013 negative nuclear stress test in November of 2013, hypertension, moderately severe marked regurgitation, hypercholesteremia, history of recurrent congestive heart failure secondary to systolic dysfunction/valvular heart disease, hypercholesteremia, tobacco abuse, history of cocaine abuse, history of non-small cell CA off left lung status post left pneumonectomy, came to the ER complaining off progressive increasing shortness of breath associated with wheezing and pleuritic chest pain for last 2 days. Patient also complains of cough with whitish phlegm denies any fever chills sore throat. Denies any palpitation lightheadedness or syncope. Denies pain the orthopnea leg swelling.  Past Medical History  Diagnosis Date  . COPD (chronic obstructive pulmonary disease)   . CHF (congestive heart failure)   . lung ca dx'd 2005    chemo/xrt comp 2005, lung ca    Past Surgical History  Procedure Date  . Coronary angioplasty with stent placement   . Pneumonectomy 2005    left    Family History  Problem Relation Age of Onset  . Hypertension Other   . Diabetes Other    Social History:  reports that he has been smoking Cigarettes.  He has never used smokeless tobacco. He reports that he uses illicit drugs (Cocaine). He reports that he does not drink alcohol.  Allergies:  Allergies  Allergen Reactions  . Penicillins Itching     (Not in a hospital admission)  Results for orders placed during the hospital encounter of 11/30/12 (from the past 48 hour(s))  TROPONIN I     Status: Normal   Collection Time   11/30/12  3:40 PM      Component Value Range  Comment   Troponin I <0.30  <0.30 ng/mL   CBC     Status: Abnormal   Collection Time   11/30/12  3:40 PM      Component Value Range Comment   WBC 7.8  4.0 - 10.5 K/uL    RBC 4.32  4.22 - 5.81 MIL/uL    Hemoglobin 13.1  13.0 - 17.0 g/dL    HCT 16.1 (*) 09.6 - 52.0 %    MCV 87.0  78.0 - 100.0 fL    MCH 30.3  26.0 - 34.0 pg    MCHC 34.8  30.0 - 36.0 g/dL    RDW 04.5  40.9 - 81.1 %    Platelets 297  150 - 400 K/uL   COMPREHENSIVE METABOLIC PANEL     Status: Abnormal   Collection Time   11/30/12  3:40 PM      Component Value Range Comment   Sodium 139  135 - 145 mEq/L    Potassium 3.8  3.5 - 5.1 mEq/L    Chloride 105  96 - 112 mEq/L    CO2 25  19 - 32 mEq/L    Glucose, Bld 108 (*) 70 - 99 mg/dL    BUN 14  6 - 23 mg/dL    Creatinine, Ser 9.14  0.50 - 1.35 mg/dL    Calcium 8.7  8.4 - 78.2 mg/dL    Total Protein 6.8  6.0 - 8.3 g/dL    Albumin 2.9 (*) 3.5 - 5.2 g/dL    AST 33  0 - 37 U/L  ALT 26  0 - 53 U/L    Alkaline Phosphatase 109  39 - 117 U/L    Total Bilirubin 0.3  0.3 - 1.2 mg/dL    GFR calc non Af Amer >90  >90 mL/min    GFR calc Af Amer >90  >90 mL/min   PRO B NATRIURETIC PEPTIDE     Status: Abnormal   Collection Time   11/30/12  3:40 PM      Component Value Range Comment   Pro B Natriuretic peptide (BNP) 1901.0 (*) 0 - 125 pg/mL    Dg Chest 2 View  11/30/2012  *RADIOLOGY REPORT*  Clinical Data: Shortness of breath.  Prior lung cancer.  CHEST - 2 VIEW  Comparison: 09/15/2012  Findings: Changes of prior left pneumonectomy with stable appearance.  Increasing interstitial prominence throughout the right lung.  This could reflect interstitial edema superimposed on chronic interstitial lung disease.  Heart is difficult to visualize with the pneumonectomy changes, likely borderline.  No visible right effusion.  No acute bony abnormality.  IMPRESSION: Worsening interstitial prominence throughout the right lung, question acute edema superimposed on chronic interstitial lung disease.   Stable post pneumonectomy changes on the left.   Original Report Authenticated By: Charlett Nose, M.D.     Review of Systems  Constitutional: Negative for fever and chills.  Respiratory: Positive for cough, shortness of breath and wheezing. Negative for hemoptysis and sputum production.   Cardiovascular: Positive for chest pain. Negative for palpitations, claudication and leg swelling.  Gastrointestinal: Negative for nausea, vomiting and abdominal pain.  Genitourinary: Negative for dysuria.  Neurological: Negative for dizziness.    Blood pressure 102/79, pulse 84, temperature 97.9 F (36.6 C), temperature source Oral, resp. rate 24, SpO2 100.00%. Physical Exam  Constitutional: He is oriented to person, place, and time.  HENT:  Head: Normocephalic and atraumatic.  Eyes: Conjunctivae normal and EOM are normal. Pupils are equal, round, and reactive to light. Left eye exhibits no discharge. No scleral icterus.  Neck: Normal range of motion. Neck supple. JVD present. No tracheal deviation present. No thyromegaly present.  Cardiovascular: Normal rate and regular rhythm.   Murmur (2/6 systolic murmur and soft S3 gallop noted) heard. Respiratory:       Right lung field rails and occasional expiratory wheezing noted  GI: Soft. Bowel sounds are normal. He exhibits no distension. There is no tenderness. There is no rebound and no guarding.  Musculoskeletal: He exhibits no edema and no tenderness.  Neurological: He is alert and oriented to person, place, and time.     Assessment/Plan Exacerbation of COPD Mild systolic heart failure Coronary artery disease status post multiple PCI's to LAD circumflex and ramus in the past as above Atypical chest pain Valvular heart disease Ischemic cardiomyopathy Moderately severe mitral regurgitation Hypertension Hypercholesteremia History of tobacco abuse History of cocaine abuse Positive family history of coronary artery disease History of non-small  cell CA of left lung status post left pneumonectomy Plan As per orders Check old records  [facility] N 11/30/2012, 8:36 PM

## 2012-11-30 NOTE — ED Provider Notes (Addendum)
History    57 year old male with dyspnea. Gradual onset 2 days ago and progressively worsening. Worsening wheezing. Feels like he needs to cough but can't get anything up. No fevers or chills. Denies any chest pain. He states that his head is cardiologist office earlier today for an echocardiogram. He self reports that he has a pericardial effusion. Patient is status post left lobectomy for history of lung cancer. Also history of COPD and continues to smoke. Has a history of systolic heart failure. He has a history of coronary artery disease with stenting.  CSN: [id number]  Arrival date & time 11/30/12  1359   First MD Initiated Contact with Patient 11/30/12 1549      No chief complaint on file.   (Consider location/radiation/quality/duration/timing/severity/associated sxs/prior treatment) HPI  Past Medical History  Diagnosis Date  . COPD (chronic obstructive pulmonary disease)   . CHF (congestive heart failure)   . lung ca dx'd 2005    chemo/xrt comp 2005, lung ca    Past Surgical History  Procedure Date  . Coronary angioplasty with stent placement   . Pneumonectomy 2005    left    Family History  Problem Relation Age of Onset  . Hypertension Other   . Diabetes Other     History  Substance Use Topics  . Smoking status: Current Some Day Smoker    Types: Cigarettes  . Smokeless tobacco: Never Used  . Alcohol Use: No     Comment: former      Review of Systems  All systems reviewed and negative, other than as noted in HPI.   Allergies  Penicillins  Home Medications   Current Outpatient Rx  Name  Route  Sig  Dispense  Refill  . AMITRIPTYLINE HCL 25 MG PO TABS   Oral   Take 25 mg by mouth at bedtime.         . ASPIRIN 325 MG PO TABS   Oral   Take 325 mg by mouth daily.         . ATORVASTATIN CALCIUM 20 MG PO TABS   Oral   Take 20 mg by mouth daily.         Marland Kitchen CARVEDILOL 3.125 MG PO TABS   Oral   Take 3.125 mg by mouth 2 (two) times daily with  a meal.         . FLUTICASONE-SALMETEROL 250-50 MCG/DOSE IN AEPB   Inhalation   Inhale 1 puff into the lungs 2 (two) times daily.   60 each   1   . OXYCODONE-ACETAMINOPHEN 7.5-325 MG PO TABS   Oral   Take 1 tablet by mouth 2 (two) times daily as needed. For pain         . PRASUGREL HCL 10 MG PO TABS   Oral   Take 10 mg by mouth daily.         Marland Kitchen RAMIPRIL 1.25 MG PO CAPS   Oral   Take 1.25 mg by mouth daily.         Marland Kitchen ZOLPIDEM TARTRATE 5 MG PO TABS   Oral   Take 5 mg by mouth at bedtime as needed. For sleep           BP 102/79  Pulse 84  Resp 24  SpO2 100%  Physical Exam  Nursing note and vitals reviewed. Constitutional: He appears well-developed and well-nourished. No distress.  HENT:  Head: Normocephalic and atraumatic.  Eyes: Conjunctivae normal are normal. Right eye exhibits no discharge. Left  eye exhibits no discharge.  Neck: Neck supple.  Cardiovascular: Normal rate, regular rhythm and normal heart sounds.  Exam reveals no gallop and no friction rub.   No murmur heard. Pulmonary/Chest: Effort normal. No respiratory distress. He has wheezes.       Expiratory wheezing bilaterally. Prolonged expiratory phase.  Abdominal: Soft. He exhibits no distension. There is no tenderness.  Musculoskeletal: He exhibits no edema and no tenderness.  Neurological: He is alert.  Skin: Skin is warm and dry.  Psychiatric: He has a normal mood and affect. His behavior is normal. Thought content normal.    ED Course  Procedures (including critical care time)  Labs Reviewed  CBC - Abnormal; Notable for the following:    HCT 37.6 (*)     All other components within normal limits  COMPREHENSIVE METABOLIC PANEL - Abnormal; Notable for the following:    Glucose, Bld 108 (*)     Albumin 2.9 (*)     All other components within normal limits  PRO B NATRIURETIC PEPTIDE - Abnormal; Notable for the following:    Pro B Natriuretic peptide (BNP) 1901.0 (*)     All other  components within normal limits  TROPONIN I   Dg Chest 2 View  11/30/2012  *RADIOLOGY REPORT*  Clinical Data: Shortness of breath.  Prior lung cancer.  CHEST - 2 VIEW  Comparison: 09/15/2012  Findings: Changes of prior left pneumonectomy with stable appearance.  Increasing interstitial prominence throughout the right lung.  This could reflect interstitial edema superimposed on chronic interstitial lung disease.  Heart is difficult to visualize with the pneumonectomy changes, likely borderline.  No visible right effusion.  No acute bony abnormality.  IMPRESSION: Worsening interstitial prominence throughout the right lung, question acute edema superimposed on chronic interstitial lung disease.  Stable post pneumonectomy changes on the left.   Original Report Authenticated By: Charlett Nose, M.D.    EKG:  Rhythm: Normal sinus Vent. rate 84 BPM PR interval 180 ms QRS duration 112 ms QT/QTc 420/496 ms ST segments: Nonspecific ST changes Comparison: Stable from previous EKG from 09/15/2012   1. Dyspnea       MDM  57 year old male with dyspnea. Patient was ambulated prior to possible discharge. He desaturated to 75% on room air with fairly minimal exertion. He has no home O2 oxygen requirement. Etiology is likely multifactorial, but suspect primarily copd exacerbation. Patient has wheezing on exam. He apparently continues to smoke.Given additional nebs and steroids. Some question of edema on his chest x-ray. Patient does have a history of systolic heart failure. He is not hypertensive. His BNP is actually a little bit lower than has been recently. Will gently diuresis. Given hypoxia, he will require admission.        Raeford Razor, MD 11/30/12 2013  Raeford Razor, MD 11/30/12 2035

## 2012-11-30 NOTE — ED Notes (Signed)
Patient states he was sent here by his cardiologist office after ultrasound that showed fluid around his heart. Patient states he since has had pain in his right side and has difficulty catching his breath.

## 2012-12-01 LAB — CBC WITH DIFFERENTIAL/PLATELET
Basophils Absolute: 0 10*3/uL (ref 0.0–0.1)
Basophils Relative: 0 % (ref 0–1)
Eosinophils Relative: 0 % (ref 0–5)
HCT: 38.3 % — ABNORMAL LOW (ref 39.0–52.0)
MCHC: 36.6 g/dL — ABNORMAL HIGH (ref 30.0–36.0)
MCV: 86.3 fL (ref 78.0–100.0)
Monocytes Absolute: 0.3 10*3/uL (ref 0.1–1.0)
Neutro Abs: 6.4 10*3/uL (ref 1.7–7.7)
RDW: 15.4 % (ref 11.5–15.5)

## 2012-12-01 LAB — COMPREHENSIVE METABOLIC PANEL
AST: 34 U/L (ref 0–37)
Albumin: 3.2 g/dL — ABNORMAL LOW (ref 3.5–5.2)
Calcium: 9.2 mg/dL (ref 8.4–10.5)
Creatinine, Ser: 0.91 mg/dL (ref 0.50–1.35)

## 2012-12-01 LAB — PRO B NATRIURETIC PEPTIDE: Pro B Natriuretic peptide (BNP): 1962 pg/mL — ABNORMAL HIGH (ref 0–125)

## 2012-12-01 LAB — TSH: TSH: 1.699 u[IU]/mL (ref 0.350–4.500)

## 2012-12-01 LAB — BASIC METABOLIC PANEL
BUN: 15 mg/dL (ref 6–23)
Calcium: 9.3 mg/dL (ref 8.4–10.5)
Chloride: 103 mEq/L (ref 96–112)
Creatinine, Ser: 0.89 mg/dL (ref 0.50–1.35)
GFR calc Af Amer: >90 mL/min (ref >90)

## 2012-12-01 LAB — PROTIME-INR
INR: 1.07 (ref 0.00–1.49)
Prothrombin Time: 13.8 seconds (ref 11.6–15.2)

## 2012-12-01 LAB — MAGNESIUM: Magnesium: 1.7 mg/dL (ref 1.5–2.5)

## 2012-12-01 MED ORDER — GUAIFENESIN-DM 100-10 MG/5ML PO SYRP
15.0000 mL | ORAL_SOLUTION | Freq: Four times a day (QID) | ORAL | Status: DC | PRN
  Administered 2012-12-02 – 2012-12-03 (×2): 15 mL via ORAL
  Filled 2012-12-01: qty 10
  Filled 2012-12-01: qty 15
  Filled 2012-12-01: qty 5

## 2012-12-01 MED ORDER — METHYLPREDNISOLONE SODIUM SUCC 125 MG IJ SOLR
60.0000 mg | Freq: Three times a day (TID) | INTRAMUSCULAR | Status: DC
  Administered 2012-12-01 – 2012-12-03 (×5): 60 mg via INTRAVENOUS
  Filled 2012-12-01 (×8): qty 0.96
  Filled 2012-12-01: qty 2

## 2012-12-01 MED ORDER — ZOLPIDEM TARTRATE 5 MG PO TABS
5.0000 mg | ORAL_TABLET | Freq: Every evening | ORAL | Status: DC | PRN
  Administered 2012-12-01 – 2012-12-05 (×6): 5 mg via ORAL
  Filled 2012-12-01 (×6): qty 1

## 2012-12-01 NOTE — Progress Notes (Signed)
Utilization Review Completed Colt Martelle J. Derk Doubek, RN, BSN, NCM [phone]  

## 2012-12-01 NOTE — Progress Notes (Signed)
Subjective:  Patient denies any chest pain states breathing is improved.  Objective:  Vital Signs in the last 24 hours: Temp:  [96.8 F (36 C)-97.9 F (36.6 C)] 97.4 F (36.3 C) (01/17 1355) Pulse Rate:  [85-100] 87  (01/17 1704) Resp:  [19-32] 21  (01/17 1355) BP: (94-128)/(56-82) 106/70 mmHg (01/17 1704) SpO2:  [96 %-100 %] 100 % (01/17 1355) Weight:  [66.9 kg (147 lb 7.8 oz)] 66.9 kg (147 lb 7.8 oz) (01/16 2330)  Intake/Output from previous day: 01/16 0701 - 01/17 0700 In: 240 [P.O.:240] Out: 1825 [Urine:1825] Intake/Output from this shift: Total I/O In: 550 [P.O.:547; I.V.:3] Out: 1325 [Urine:1325]  Physical Exam: Neck: JVD - 8 cm above sternal notch, no adenopathy, no carotid bruit and supple, symmetrical, trachea midline Lungs: Right lung field rales improved Heart: regular rate and rhythm, S1, S2 normal and 2/6 systolic murmur and soft S3 gallop noted Abdomen: soft, non-tender; bowel sounds normal; no masses,  no organomegaly Extremities: extremities normal, atraumatic, no cyanosis or edema  Lab Results:  Basename 11/30/12 2349 11/30/12 1540  WBC 7.3 7.8  HGB 14.0 13.1  PLT 303 297    Basename 12/01/12 0510 11/30/12 2349  NA 139 138  K 3.5 3.5  CL 103 100  CO2 26 25  GLUCOSE 184* 150*  BUN 15 14  CREATININE 0.89 0.91    Basename 12/01/12 1105 12/01/12 0510  TROPONINI <0.30 <0.30   Hepatic Function Panel  Basename 11/30/12 2349  PROT 7.8  ALBUMIN 3.2*  AST 34  ALT 26  ALKPHOS 121*  BILITOT 0.3  BILIDIR --  IBILI --   No results found for this basename: CHOL in the last 72 hours No results found for this basename: PROTIME in the last 72 hours  Imaging: Imaging results have been reviewed and Dg Chest 2 View  11/30/2012  *RADIOLOGY REPORT*  Clinical Data: Shortness of breath.  Prior lung cancer.  CHEST - 2 VIEW  Comparison: 09/15/2012  Findings: Changes of prior left pneumonectomy with stable appearance.  Increasing interstitial prominence  throughout the right lung.  This could reflect interstitial edema superimposed on chronic interstitial lung disease.  Heart is difficult to visualize with the pneumonectomy changes, likely borderline.  No visible right effusion.  No acute bony abnormality.  IMPRESSION: Worsening interstitial prominence throughout the right lung, question acute edema superimposed on chronic interstitial lung disease.  Stable post pneumonectomy changes on the left.   Original Report Authenticated By: Charlett Nose, M.D.     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  LOS: 1 day    Ronald [name] N 12/01/2012, 5:59 PM

## 2012-12-01 NOTE — Plan of Care (Signed)
Problem: Phase I Progression Outcomes Goal: EF % per last Echo/documented,Core Reminder form on chart Outcome: Completed/Met Date Met:  12/01/12 Echo 04/06/13 EF 30-35%

## 2012-12-02 LAB — BASIC METABOLIC PANEL
CO2: 25 mEq/L (ref 19–32)
Calcium: 9.3 mg/dL (ref 8.4–10.5)
Creatinine, Ser: 0.76 mg/dL (ref 0.50–1.35)
Glucose, Bld: 170 mg/dL — ABNORMAL HIGH (ref 70–99)

## 2012-12-02 LAB — CBC
MCH: 29.7 pg (ref 26.0–34.0)
MCV: 87.9 fL (ref 78.0–100.0)
Platelets: 313 10*3/uL (ref 150–400)
RBC: 4.37 MIL/uL (ref 4.22–5.81)
RDW: 15.6 % — ABNORMAL HIGH (ref 11.5–15.5)

## 2012-12-02 MED ORDER — FUROSEMIDE 10 MG/ML IJ SOLN
40.0000 mg | Freq: Two times a day (BID) | INTRAMUSCULAR | Status: DC
  Administered 2012-12-02 – 2012-12-06 (×7): 40 mg via INTRAVENOUS
  Filled 2012-12-02 (×10): qty 4

## 2012-12-02 NOTE — Progress Notes (Signed)
Subjective:  Denies any chest pain states breathing is gradually improving  Objective:  Vital Signs in the last 24 hours: Temp:  [97.4 F (36.3 C)-97.7 F (36.5 C)] 97.7 F (36.5 C) (01/18 0414) Pulse Rate:  [85-90] 90  (01/18 1019) Resp:  [20-21] 21  (01/18 0414) BP: (97-106)/(65-70) 97/65 mmHg (01/18 1019) SpO2:  [94 %-100 %] 94 % (01/18 0414) Weight:  [66.996 kg (147 lb 11.2 oz)] 66.996 kg (147 lb 11.2 oz) (01/18 0414)  Intake/Output from previous day: 01/17 0701 - 01/18 0700 In: 1487 [P.O.:1484; I.V.:3] Out: 2100 [Urine:2100] Intake/Output from this shift: Total I/O In: 203 [P.O.:200; I.V.:3] Out: 400 [Urine:400]  Physical Exam: Neck: no adenopathy, no carotid bruit, no JVD and supple, symmetrical, trachea midline Lungs: Right lung basilar rales noted Heart: regular rate and rhythm, S1, S2 normal and 2 or 6 systolic murmur and S3 gallop noted Abdomen: soft, non-tender; bowel sounds normal; no masses,  no organomegaly Extremities: extremities normal, atraumatic, no cyanosis or edema  Lab Results:  Basename 12/02/12 0510 11/30/12 2349  WBC 18.4* 7.3  HGB 13.0 14.0  PLT 313 303    Basename 12/02/12 0510 12/01/12 0510  NA 137 139  K 3.9 3.5  CL 99 103  CO2 25 26  GLUCOSE 170* 184*  BUN 18 15  CREATININE 0.76 0.89    Basename 12/01/12 1105 12/01/12 0510  TROPONINI <0.30 <0.30   Hepatic Function Panel  Basename 11/30/12 2349  PROT 7.8  ALBUMIN 3.2*  AST 34  ALT 26  ALKPHOS 121*  BILITOT 0.3  BILIDIR --  IBILI --   No results found for this basename: CHOL in the last 72 hours No results found for this basename: PROTIME in the last 72 hours  Imaging: Imaging results have been reviewed and Dg Chest 2 View  11/30/2012  *RADIOLOGY REPORT*  Clinical Data: Shortness of breath.  Prior lung cancer.  CHEST - 2 VIEW  Comparison: 09/15/2012  Findings: Changes of prior left pneumonectomy with stable appearance.  Increasing interstitial prominence throughout the  right lung.  This could reflect interstitial edema superimposed on chronic interstitial lung disease.  Heart is difficult to visualize with the pneumonectomy changes, likely borderline.  No visible right effusion.  No acute bony abnormality.  IMPRESSION: Worsening interstitial prominence throughout the right lung, question acute edema superimposed on chronic interstitial lung disease.  Stable post pneumonectomy changes on the left.   Original Report Authenticated By: Charlett Nose, M.D.     Cardiac Studies:  Assessment/Plan:  Resolving Exacerbation of COPD  Mild systolic heart failure improved  Coronary artery disease status post multiple PCI's to LAD circumflex and ramus in the past as above  Atypical chest pain  Valvular heart disease  Ischemic cardiomyopathy  Moderately severe mitral regurgitation  Hypertension  Hypercholesteremia  History of tobacco abuse  History of cocaine abuse  Positive family history of coronary artery disease  History of non-small cell CA of left lung status post left pneumonectomy  Plan Increase Lasix as per ordered Check labs in a.m.  LOS: 2 days    Ronald [name] N 12/02/2012, 12:48 PM

## 2012-12-03 LAB — BASIC METABOLIC PANEL
BUN: 25 mg/dL — ABNORMAL HIGH (ref 6–23)
Chloride: 96 mEq/L (ref 96–112)
Creatinine, Ser: 0.98 mg/dL (ref 0.50–1.35)
GFR calc Af Amer: >90 mL/min (ref >90)

## 2012-12-03 LAB — CBC
HCT: 40.5 % (ref 39.0–52.0)
MCH: 29.5 pg (ref 26.0–34.0)
MCHC: 33.6 g/dL (ref 30.0–36.0)
MCV: 87.9 fL (ref 78.0–100.0)
RDW: 16 % — ABNORMAL HIGH (ref 11.5–15.5)

## 2012-12-03 MED ORDER — SPIRONOLACTONE 12.5 MG HALF TABLET
12.5000 mg | ORAL_TABLET | Freq: Every day | ORAL | Status: DC
  Filled 2012-12-03 (×3): qty 1

## 2012-12-03 MED ORDER — METHYLPREDNISOLONE SODIUM SUCC 40 MG IJ SOLR
40.0000 mg | Freq: Three times a day (TID) | INTRAMUSCULAR | Status: DC
  Administered 2012-12-03 – 2012-12-04 (×4): 40 mg via INTRAVENOUS
  Filled 2012-12-03 (×6): qty 1

## 2012-12-03 MED ORDER — SPIRONOLACTONE 12.5 MG HALF TABLET
12.5000 mg | ORAL_TABLET | Freq: Every day | ORAL | Status: DC
  Administered 2012-12-03 – 2012-12-06 (×4): 12.5 mg via ORAL
  Filled 2012-12-03 (×4): qty 1

## 2012-12-03 NOTE — Progress Notes (Signed)
Subjective:  Patient denies any chest pain states breathing is gradually improving. Denies any fever chills or cough  Objective:  Vital Signs in the last 24 hours: Temp:  [97.1 F (36.2 C)-97.6 F (36.4 C)] 97.1 F (36.2 C) (01/19 0844) Pulse Rate:  [83-92] 86  (01/19 0844) Resp:  [17-20] 17  (01/19 0844) BP: (94-113)/(54-76) 110/76 mmHg (01/19 1006) SpO2:  [97 %-100 %] 100 % (01/19 0844) Weight:  [66.95 kg (147 lb 9.6 oz)] 66.95 kg (147 lb 9.6 oz) (01/19 0429)  Intake/Output from previous day: 01/18 0701 - 01/19 0700 In: 643 [P.O.:640; I.V.:3] Out: 2275 [Urine:2275] Intake/Output from this shift: Total I/O In: 440 [P.O.:440] Out: 700 [Urine:700]  Physical Exam: Neck: no adenopathy, no carotid bruit, no JVD and supple, symmetrical, trachea midline Lungs: Right lung field and basilar rales noted Heart: regular rate and rhythm, S1, S2 normal and  2/6 systolic murmur and S3 gallop noted Abdomen: soft, non-tender; bowel sounds normal; no masses,  no organomegaly Extremities: extremities normal, atraumatic, no cyanosis or edema  Lab Results:  Basename 12/03/12 0450 12/02/12 0510  WBC 20.9* 18.4*  HGB 13.6 13.0  PLT 356 313    Basename 12/03/12 0450 12/02/12 0510  NA 135 137  K 4.0 3.9  CL 96 99  CO2 30 25  GLUCOSE 127* 170*  BUN 25* 18  CREATININE 0.98 0.76    Basename 12/01/12 1105 12/01/12 0510  TROPONINI <0.30 <0.30   Hepatic Function Panel  Basename 11/30/12 2349  PROT 7.8  ALBUMIN 3.2*  AST 34  ALT 26  ALKPHOS 121*  BILITOT 0.3  BILIDIR --  IBILI --   No results found for this basename: CHOL in the last 72 hours No results found for this basename: PROTIME in the last 72 hours  Imaging: Imaging results have been reviewed and No results found.  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

## 2012-12-04 MED ORDER — PREDNISONE 20 MG PO TABS
20.0000 mg | ORAL_TABLET | Freq: Every day | ORAL | Status: DC
  Administered 2012-12-05 – 2012-12-06 (×2): 20 mg via ORAL
  Filled 2012-12-04 (×3): qty 1

## 2012-12-04 MED ORDER — GUAIFENESIN-DM 100-10 MG/5ML PO SYRP
30.0000 mL | ORAL_SOLUTION | Freq: Four times a day (QID) | ORAL | Status: DC | PRN
  Administered 2012-12-04 (×2): 30 mL via ORAL
  Filled 2012-12-04 (×3): qty 30

## 2012-12-04 NOTE — Progress Notes (Signed)
Subjective:  Patient denies any chest pain states breathing has improved  Objective:  Vital Signs in the last 24 hours: Temp:  [97.2 F (36.2 C)-98 F (36.7 C)] 97.6 F (36.4 C) (01/20 0429) Pulse Rate:  [71-85] 80  (01/20 0956) Resp:  [18-85] 85  (01/20 0429) BP: (92-107)/(62-77) 107/72 mmHg (01/20 0956) SpO2:  [98 %-100 %] 100 % (01/20 0429) Weight:  [65.5 kg (144 lb 6.4 oz)] 65.5 kg (144 lb 6.4 oz) (01/20 0429)  Intake/Output from previous day: 01/19 0701 - 01/20 0700 In: 800 [P.O.:800] Out: 1725 [Urine:1725] Intake/Output from this shift: Total I/O In: 363 [P.O.:360; I.V.:3] Out: 500 [Urine:500]  Physical Exam: Neck: no adenopathy, no carotid bruit, no JVD and supple, symmetrical, trachea midline Lungs: Right lung field decreased breath sound at bases air entry improved Heart: regular rate and rhythm, S1, S2 normal and 206 systolic murmur and S3 gallop noted Abdomen: soft, non-tender; bowel sounds normal; no masses,  no organomegaly Extremities: extremities normal, atraumatic, no cyanosis or edema  Lab Results:  Basename 12/03/12 0450 12/02/12 0510  WBC 20.9* 18.4*  HGB 13.6 13.0  PLT 356 313    Basename 12/03/12 0450 12/02/12 0510  NA 135 137  K 4.0 3.9  CL 96 99  CO2 30 25  GLUCOSE 127* 170*  BUN 25* 18  CREATININE 0.98 0.76   No results found for this basename: TROPONINI:2,CK,MB:2 in the last 72 hours Hepatic Function Panel No results found for this basename: PROT,ALBUMIN,AST,ALT,ALKPHOS,BILITOT,BILIDIR,IBILI in the last 72 hours No results found for this basename: CHOL in the last 72 hours No results found for this basename: PROTIME in the last 72 hours  Imaging: Imaging results have been reviewed and No results found.  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  LOS: 4 days    Ronald [name] 12/04/2012, 12:16 PM

## 2012-12-04 NOTE — Plan of Care (Signed)
Problem: Phase I Progression Outcomes Goal: EF % per last Echo/documented,Core Reminder form on chart Outcome: Completed/Met Date Met:  12/04/12 EF 30-35% per ECHO performed on 04/06/12.

## 2012-12-05 ENCOUNTER — Other Ambulatory Visit: Payer: Self-pay | Admitting: *Deleted

## 2012-12-05 ENCOUNTER — Telehealth: Payer: Self-pay | Admitting: Internal Medicine

## 2012-12-05 ENCOUNTER — Inpatient Hospital Stay (HOSPITAL_COMMUNITY): Payer: Medicare Other

## 2012-12-05 LAB — BASIC METABOLIC PANEL
Calcium: 8.5 mg/dL (ref 8.4–10.5)
Chloride: 93 mEq/L — ABNORMAL LOW (ref 96–112)
Creatinine, Ser: 1 mg/dL (ref 0.50–1.35)
GFR calc Af Amer: >90 mL/min (ref >90)
Sodium: 135 mEq/L (ref 135–145)

## 2012-12-05 LAB — CBC
MCV: 87 fL (ref 78.0–100.0)
Platelets: 367 10*3/uL (ref 150–400)
RDW: 15.7 % — ABNORMAL HIGH (ref 11.5–15.5)
WBC: 17.1 10*3/uL — ABNORMAL HIGH (ref 4.0–10.5)

## 2012-12-05 LAB — PRO B NATRIURETIC PEPTIDE: Pro B Natriuretic peptide (BNP): 1279 pg/mL — ABNORMAL HIGH (ref 0–125)

## 2012-12-05 NOTE — Telephone Encounter (Signed)
pt called in as he missed his appt/scans in 08/2012.  advised him that i have to get the orders replaced for the scans and called stephanie.  she advised that the pt is an "in patient" at [facility] so therefore we cannot order scans at this time.  The pt called back and lm for me that he would like to get his scans done while in the hosp which we cannot do as hosp dx has nothing to do with [place].  Tried to call the pt back and recd his vm which stated his vm is not working-only his text.

## 2012-12-05 NOTE — Progress Notes (Signed)
Subjective:  Patient chest pain breathing is improved noted to be hypotensive this morning Coreg and Lasix was held  Objective:  Vital Signs in the last 24 hours: Temp:  [97.6 F (36.4 C)] 97.6 F (36.4 C) (01/21 0418) Pulse Rate:  [70-83] 80  (01/21 0854) Resp:  [18-19] 19  (01/21 0418) BP: (83-98)/(52-68) 83/52 mmHg (01/21 0854) SpO2:  [91 %-98 %] 97 % (01/21 0418) Weight:  [65.635 kg (144 lb 11.2 oz)] 65.635 kg (144 lb 11.2 oz) (01/21 0418)  Intake/Output from previous day: 01/20 0701 - 01/21 0700 In: 1046 [P.O.:1020; I.V.:6; IV Piggyback:20] Out: 1625 [Urine:1625] Intake/Output from this shift: Total I/O In: 240 [P.O.:240] Out: -   Physical Exam: Neck: no adenopathy, no carotid bruit, no JVD and supple, symmetrical, trachea midline Lungs: Right lung field decreased breath sounds at bases with minimal rales Heart: regular rate and rhythm, S1, S2 normal and 2/6 systolic murmur and soft S3 gallop noted Abdomen: soft, non-tender; bowel sounds normal; no masses,  no organomegaly Extremities: extremities normal, atraumatic, no cyanosis or edema  Lab Results:  Basename 12/05/12 0625 12/03/12 0450  WBC 17.1* 20.9*  HGB 15.2 13.6  PLT 367 356    Basename 12/05/12 0625 12/03/12 0450  NA 135 135  K 3.5 4.0  CL 93* 96  CO2 28 30  GLUCOSE 108* 127*  BUN 26* 25*  CREATININE 1.00 0.98   No results found for this basename: TROPONINI:2,CK,MB:2 in the last 72 hours Hepatic Function Panel No results found for this basename: PROT,ALBUMIN,AST,ALT,ALKPHOS,BILITOT,BILIDIR,IBILI in the last 72 hours No results found for this basename: CHOL in the last 72 hours No results found for this basename: PROTIME in the last 72 hours  Imaging: Imaging results have been reviewed and Dg Chest 2 View  12/05/2012  *RADIOLOGY REPORT*  Clinical Data: Congestive heart failure and shortness of breath. Left pneumonectomy.  CHEST - 2 VIEW  Comparison: 11/30/2012  Findings: Stable findings of left  pneumonectomy noted.  Coronary artery stents are present.  Left sixth rib resection noted.  Interstitial accentuation in the right lung is minimal improved, although some Kerley B lines and sixth interstitial accentuation persist. The right pleural effusion is no longer readily apparent.  IMPRESSION:  1.  Slightly improved right-sided interstitial opacity.  The previously demonstrated right pleural effusion is less readily apparent.  Some Kerley B lines persist on the right, compatible with mild residual interstitial edema.   Original Report Authenticated By: Gaylyn Rong, M.D.     Cardiac Studies:  Assessment/Plan:  Resolving Exacerbation of COPD  Mild systolic heart failure improved  Coronary artery disease status post multiple PCI's to LAD circumflex and ramus in the past as above  Atypical chest pain  Valvular heart disease  Ischemic cardiomyopathy  Moderately severe mitral regurgitation  Hypertension  Hypercholesteremia  History of tobacco abuse  History of cocaine abuse  Positive family history of coronary artery disease  History of non-small cell CA of left lung status post left pneumonectomy  Plan Continue present management Check labs in a.m.  LOS: 5 days    Valin Massie N 12/05/2012, 10:15 AM

## 2012-12-06 LAB — PRO B NATRIURETIC PEPTIDE: Pro B Natriuretic peptide (BNP): 1390 pg/mL — ABNORMAL HIGH (ref 0–125)

## 2012-12-06 LAB — BASIC METABOLIC PANEL
Calcium: 8.4 mg/dL (ref 8.4–10.5)
Chloride: 96 mEq/L (ref 96–112)
Creatinine, Ser: 0.96 mg/dL (ref 0.50–1.35)
GFR calc Af Amer: >90 mL/min (ref >90)

## 2012-12-06 MED ORDER — FUROSEMIDE 40 MG PO TABS: 40.0000 mg | ORAL_TABLET | Freq: Every day | ORAL | Status: DC

## 2012-12-06 MED ORDER — PREDNISONE 10 MG PO TABS: 10.0000 mg | ORAL_TABLET | Freq: Every day | ORAL | Status: DC

## 2012-12-06 MED ORDER — ASPIRIN 81 MG PO TBEC: 81.0000 mg | DELAYED_RELEASE_TABLET | Freq: Every day | ORAL | Status: DC

## 2012-12-06 MED ORDER — SPIRONOLACTONE 12.5 MG HALF TABLET: 25.0000 mg | ORAL_TABLET | Freq: Every day | ORAL | Status: DC

## 2012-12-06 NOTE — Progress Notes (Signed)
D/c instructions given to pt regarding f/u care and appointments, s/s of when to notify MD, home medications and diet/activity restrictions. Pt acknowledged receipt with no questions. Pt appears eager to leave hospital.  IV and telemetry removed from pt.  Volunteer services notified for pt transport to ride.

## 2012-12-06 NOTE — Discharge Summary (Signed)
  Discharge summary dictated on 12/06/2012 dictation [phone]

## 2012-12-07 ENCOUNTER — Telehealth: Payer: Self-pay | Admitting: Medical Oncology

## 2012-12-07 NOTE — Discharge Summary (Signed)
NAMEABASS, MISENER             ACCOUNT NO.:  [id number]  MEDICAL RECORD NO.:  [id number]  LOCATION:  4706                         FACILITY:  [facility]  PHYSICIAN:  Eduardo Osier. Sharyn Lull, M.D. DATE OF BIRTH:  July 18, 1956  DATE OF ADMISSION:  11/30/2012 DATE OF DISCHARGE:  12/06/2012                              DISCHARGE SUMMARY   ADMITTING DIAGNOSES: 1. Exacerbation of chronic obstructive pulmonary disease. 2. Mild systolic heart failure. 3. Coronary artery disease, status post multiple PCIs to LAD, left     circumflex, and ramus in the past. 4. Atypical chest pain. 5. Valvular heart disease. 6. Ischemic cardiomyopathy. 7. Moderately severe mitral regurgitation. 8. Hypertension. 9. Hypercholesteremia. 10.History of tobacco abuse. 11.History of cocaine abuse. 12.Positive family history of coronary artery disease. 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  DISCHARGE DIAGNOSES: 1. Status post exacerbation of chronic obstructive pulmonary disease. 2. Compensated systolic heart failure. 3. Coronary artery disease, status post multiple PCIs to LAD, left     circumflex, and ramus in the past. 4. Status post atypical chest pain. 5. Valvular heart disease. 6. Ischemic cardiomyopathy. 7. Moderately severe mitral regurgitation. 8. Hypertension. 9. Hypercholesteremia. 10.History of tobacco abuse. 11.History of cocaine abuse. 12.Positive family history of coronary artery disease. 13.History of non-small cell carcinoma of  left lung, status post left     pneumonectomy.  DISCHARGE HOME MEDICATIONS:  Enteric-coated aspirin 81 mg one tablet daily; Lasix 40 mg one tablet daily; prednisone 10 mg one tablet daily; Aldactone 25 mg one tablet daily; amitriptyline 25 mg one tablet daily at night; atorvastatin 20 mg 1 tablet daily; Coreg 3.125 mg twice daily; Advair 250/50 one puff twice daily; Percocet 1 tablet twice daily as needed for pain as  before; lisinopril 10 mg one tablet daily; ramipril 1.25 mg one capsule daily; Ambien 5 mg daily at night as needed.  DIET:  Low salt, low cholesterol.  Heart failure instructions have been given.  The patient has been advised to monitor his weight daily.  The patient has been advised to refrain from drugs and smoking.  Follow up with me in 1 week.  CONDITION AT DISCHARGE:  Stable.  BRIEF HISTORY AND HOSPITAL COURSE:  The patient is a 57 year old male with past medical history significant for multiple medical problems, i.e., coronary artery disease status post PTCA stenting to LAD in July 2009, subsequently had PTCA stenting to left circumflex and ramus in 2013, had negative stress test in November 2013, hypertension, moderately severe mitral regurgitation, hypercholesterolemia, history of recurrent congestive heart failure secondary to systolic dysfunction/valvular heart disease, tobacco abuse, history of cocaine abuse, history of non-small cell CA of the left lung status post left pneumonectomy.  He came to the ER complaining of progressive increasing shortness of breath associated with wheezing and pleuritic chest pain for the last 2 days.  The patient also complains of cough with whitish phlegm.  Denies any fever, chills, sore throat.  Denies any palpitation, lightheadedness, or syncope.  Denies PND, orthopnea, or leg swelling.  PAST MEDICAL HISTORY:  As above.  PHYSICAL EXAMINATION:  VITAL SIGNS:  His blood pressure was 102/79, pulse was 84, he was  afebrile. HEENT:  Conjunctivae pink. NECK:  Supple.  Positive JVD.  No bruit. LUNGS:  Right lung field, rales and occasional expiratory wheezing. CARDIOVASCULAR:  Regular rate and rhythm.  There was 2/6 systolic murmur and soft S3 gallop noted. ABDOMEN:  Soft.  Bowel sounds present.  Nontender. EXTREMITIES:  There is no clubbing, cyanosis, or edema.  LABORATORY DATA:  Sodium was 139, potassium 3.8, BUN 14, creatinine 0.83.  His  BNP was 1901.  Three sets of troponin I are negative. Hemoglobin was 13.1, hematocrit 37.6, white count of 7.8.  Yesterday, hemoglobin was 15.2, hematocrit 44.3, white count of 17.1.  He is on steroids.  His BNP yesterday was 1279, which is trending down.  Sodium is 135, potassium 3.5, BUN 26, creatinine 1.0.  Chest x-ray showed worsening interstitial prominence towards the right lung, question acute edema superimposed on chronic interstitial lung disease.  Repeat chest x- ray done showed improved right interstitial opacity.  The previously demonstrated right pleural effusion is less radially apparent.  Some fairly benign versus compatible with mild residual edema.  EKG showed normal sinus rhythm with right axis deviation, nonspecific ST-T wave changes, QTc was prolonged.  PTCA done on 22nd showed normal sinus rhythm, possible left atrial enlargement.  Nonspecific ST-T wave changes.  BRIEF HOSPITAL COURSE:  The patient was admitted to telemetry unit.  MI was ruled out by serial enzymes and EKG.  The patient did not have any typical anginal chest pain during the hospital stay.  The patient was started on IV Lasix and was also started on Solu-Medrol IV which was weaned off and switched to p.o. prednisone.  The patient clinically improved and has been ambulating in hallway without any problems.  The patient will be discharged home on above medications and will be followed up in my office in 1 week.     Eduardo Osier. Sharyn Lull, M.D.    [facility]  D:  12/06/2012  T:  12/07/2012  Job:  [id number]

## 2012-12-07 NOTE — Telephone Encounter (Signed)
Transferred call to [employer]

## 2012-12-08 ENCOUNTER — Telehealth: Payer: Self-pay | Admitting: Medical Oncology

## 2012-12-08 ENCOUNTER — Telehealth: Payer: Self-pay | Admitting: Internal Medicine

## 2012-12-08 DIAGNOSIS — C3490 Malignant neoplasm of unspecified part of unspecified bronchus or lung: Secondary | ICD-10-CM

## 2012-12-08 NOTE — Telephone Encounter (Signed)
Pt calling to r/s f/u - last appt 08/2011. I entered onc tx schedule , labs and scan

## 2012-12-08 NOTE — Telephone Encounter (Signed)
called pt to advise of appts made and unable to leave a message as his phone will not allow a message to be left.

## 2012-12-08 NOTE — Telephone Encounter (Signed)
appt given to pt and I told him someone will call him with his CT appt

## 2012-12-14 ENCOUNTER — Other Ambulatory Visit (HOSPITAL_BASED_OUTPATIENT_CLINIC_OR_DEPARTMENT_OTHER): Payer: Medicare Other | Admitting: Lab

## 2012-12-14 ENCOUNTER — Ambulatory Visit (HOSPITAL_COMMUNITY)
Admission: RE | Admit: 2012-12-14 | Discharge: 2012-12-14 | Disposition: A | Discharge status: Home / Self Care | Payer: Medicare Other | Source: Ambulatory Visit | Attending: Internal Medicine | Admitting: Internal Medicine

## 2012-12-14 DIAGNOSIS — I700 Atherosclerosis of aorta: Secondary | ICD-10-CM | POA: Insufficient documentation

## 2012-12-14 DIAGNOSIS — C3490 Malignant neoplasm of unspecified part of unspecified bronchus or lung: Secondary | ICD-10-CM

## 2012-12-14 DIAGNOSIS — Z923 Personal history of irradiation: Secondary | ICD-10-CM | POA: Insufficient documentation

## 2012-12-14 DIAGNOSIS — I2510 Atherosclerotic heart disease of native coronary artery without angina pectoris: Secondary | ICD-10-CM | POA: Insufficient documentation

## 2012-12-14 DIAGNOSIS — K7689 Other specified diseases of liver: Secondary | ICD-10-CM | POA: Insufficient documentation

## 2012-12-14 DIAGNOSIS — Z902 Acquired absence of lung [part of]: Secondary | ICD-10-CM | POA: Insufficient documentation

## 2012-12-14 DIAGNOSIS — Z9221 Personal history of antineoplastic chemotherapy: Secondary | ICD-10-CM | POA: Insufficient documentation

## 2012-12-14 LAB — CBC WITH DIFFERENTIAL/PLATELET
BASO%: 0.7 % (ref 0.0–2.0)
EOS%: 0.7 % (ref 0.0–7.0)
MCH: 29.3 pg (ref 27.2–33.4)
MCHC: 33.3 g/dL (ref 32.0–36.0)
RDW: 16.1 % — ABNORMAL HIGH (ref 11.0–14.6)
lymph#: 1.4 10*3/uL (ref 0.9–3.3)

## 2012-12-14 LAB — COMPREHENSIVE METABOLIC PANEL (CC13)
AST: 31 U/L (ref 5–34)
BUN: 21.8 mg/dL (ref 7.0–26.0)
Calcium: 9 mg/dL (ref 8.4–10.4)
Chloride: 101 mEq/L (ref 98–107)
Creatinine: 1 mg/dL (ref 0.7–1.3)

## 2012-12-14 MED ORDER — IOHEXOL 300 MG/ML  SOLN
80.0000 mL | Freq: Once | INTRAMUSCULAR | Status: AC | PRN
  Administered 2012-12-14: 80 mL via INTRAVENOUS

## 2012-12-15 ENCOUNTER — Other Ambulatory Visit: Payer: Medicare Other | Admitting: Lab

## 2012-12-19 ENCOUNTER — Ambulatory Visit: Payer: Medicare Other | Admitting: Internal Medicine

## 2012-12-20 ENCOUNTER — Telehealth: Payer: Self-pay | Admitting: Internal Medicine

## 2012-12-21 ENCOUNTER — Encounter: Payer: Self-pay | Admitting: Internal Medicine

## 2012-12-21 ENCOUNTER — Ambulatory Visit (HOSPITAL_BASED_OUTPATIENT_CLINIC_OR_DEPARTMENT_OTHER): Payer: Medicare Other | Admitting: Internal Medicine

## 2012-12-21 DIAGNOSIS — C3490 Malignant neoplasm of unspecified part of unspecified bronchus or lung: Secondary | ICD-10-CM

## 2012-12-21 DIAGNOSIS — Z85118 Personal history of other malignant neoplasm of bronchus and lung: Secondary | ICD-10-CM

## 2012-12-21 NOTE — Progress Notes (Signed)
[facility] Telephone:([phone]) [phone]   Fax:([phone]) [phone]  OFFICE PROGRESS NOTE  Jeri Cos, MD [phone] [facility] [address] [place] [address]   PRINCIPAL DIAGNOSIS:  Stage IIIA non-small cell lung cancer diagnosed in July of 2004.  PRIOR THERAPY:   1. Status post neoadjuvant concurrent chemoradiation followed by consolidation chemotherapy with carboplatin and paclitaxel for 2 cycles. 2. Status post left pneumonectomy in January of 2005 under the care of Dr. Berneda Rose in [place], [place].  CURRENT THERAPY:  Observation.  INTERVAL HISTORY: Ronald [name] 57 y.o. male returns to the clinic today for followup visit. The patient is feeling fine today with no specific complaints. He was last seen at the cancer Center in October of 2012. The patient denied having any significant complaints since that time. He has no chest pain, shortness breath, cough or hemoptysis. He denied having any significant weight loss or night sweats. He has repeat CT scan of the chest performed recently and he is here for evaluation and discussion of his scan results.  MEDICAL HISTORY: Past Medical History  Diagnosis Date  . COPD (chronic obstructive pulmonary disease)   . CHF (congestive heart failure)   . lung ca dx'd 2005    chemo/xrt comp 2005, lung ca    ALLERGIES:  is allergic to penicillins.  MEDICATIONS:  Current Outpatient Prescriptions  Medication Sig Dispense Refill  . amitriptyline (ELAVIL) 25 MG tablet Take 25 mg by mouth at bedtime.      Marland Kitchen aspirin EC 81 MG EC tablet Take 1 tablet (81 mg total) by mouth daily.  30 tablet  3  . atorvastatin (LIPITOR) 20 MG tablet Take 20 mg by mouth daily.      . carvedilol (COREG) 3.125 MG tablet Take 3.125 mg by mouth 2 (two) times daily with a meal.      . Fluticasone-Salmeterol (ADVAIR DISKUS) 250-50 MCG/DOSE AEPB Inhale 1 puff into the lungs 2 (two) times daily.  60 each  1  . furosemide (LASIX) 40 MG tablet Take 1  tablet (40 mg total) by mouth daily.  30 tablet  3  . oxyCODONE-acetaminophen (PERCOCET) 7.5-325 MG per tablet Take 1 tablet by mouth 2 (two) times daily as needed. For pain      . prasugrel (EFFIENT) 10 MG TABS Take 10 mg by mouth daily.      . predniSONE (DELTASONE) 10 MG tablet Take 1 tablet (10 mg total) by mouth daily.  10 tablet  0  . ramipril (ALTACE) 1.25 MG capsule Take 1.25 mg by mouth daily.      Marland Kitchen spironolactone (ALDACTONE) 12.5 mg TABS Take 1 tablet (25 mg total) by mouth daily.  30 tablet  3  . zolpidem (AMBIEN) 5 MG tablet Take 5 mg by mouth at bedtime as needed. For sleep        SURGICAL HISTORY:  Past Surgical History  Procedure Date  . Coronary angioplasty with stent placement   . Pneumonectomy 2005    left    REVIEW OF SYSTEMS:  A comprehensive review of systems was negative.   PHYSICAL EXAMINATION: General appearance: alert, cooperative and no distress Head: Normocephalic, without obvious abnormality, atraumatic Neck: no adenopathy Lymph nodes: Cervical, supraclavicular, and axillary nodes normal. Resp: clear to auscultation on the right with absent breath sounds on the left  Cardio: regular rate and rhythm, S1, S2 normal, no murmur, click, rub or gallop GI: soft, non-tender; bowel sounds normal; no masses,  no organomegaly Extremities: extremities normal, atraumatic, no cyanosis or edema  ECOG PERFORMANCE STATUS: 0 - Asymptomatic  There were no vitals taken for this visit.  LABORATORY DATA: Lab Results  Component Value Date   WBC 8.3 12/14/2012   HGB 15.9 12/14/2012   HCT 47.6 12/14/2012   MCV 88.1 12/14/2012   PLT 250 12/14/2012      Chemistry      Component Value Date/Time   NA 133* 12/14/2012 0942   NA 137 12/06/2012 0640   NA 141 08/23/2011 1116   K 4.5 12/14/2012 0942   K 3.7 12/06/2012 0640   K 4.4 08/23/2011 1116   CL 101 12/14/2012 0942   CL 96 12/06/2012 0640   CL 104 08/23/2011 1116   CO2 24 12/14/2012 0942   CO2 33* 12/06/2012 0640   CO2 26  08/23/2011 1116   BUN 21.8 12/14/2012 0942   BUN 27* 12/06/2012 0640   BUN 8 08/23/2011 1116   CREATININE 1.0 12/14/2012 0942   CREATININE 0.96 12/06/2012 0640   CREATININE 1.0 08/23/2011 1116      Component Value Date/Time   CALCIUM 9.0 12/14/2012 0942   CALCIUM 8.4 12/06/2012 0640   CALCIUM 10.0 08/23/2011 1116   ALKPHOS 108 12/14/2012 0942   ALKPHOS 121* 11/30/2012 2349   ALKPHOS 86* 08/23/2011 1116   AST 31 12/14/2012 0942   AST 34 11/30/2012 2349   AST 22 08/23/2011 1116   ALT 42 12/14/2012 0942   ALT 26 11/30/2012 2349   BILITOT 0.63 12/14/2012 0942   BILITOT 0.3 11/30/2012 2349   BILITOT 0.70 08/23/2011 1116       RADIOGRAPHIC STUDIES: Dg Chest 2 View  12/05/2012  *RADIOLOGY REPORT*  Clinical Data: Congestive heart failure and shortness of breath. Left pneumonectomy.  CHEST - 2 VIEW  Comparison: 11/30/2012  Findings: Stable findings of left pneumonectomy noted.  Coronary artery stents are present.  Left sixth rib resection noted.  Interstitial accentuation in the right lung is minimal improved, although some Kerley B lines and sixth interstitial accentuation persist. The right pleural effusion is no longer readily apparent.  IMPRESSION:  1.  Slightly improved right-sided interstitial opacity.  The previously demonstrated right pleural effusion is less readily apparent.  Some Kerley B lines persist on the right, compatible with mild residual interstitial edema.   Original Report Authenticated By: Gaylyn Rong, M.D.    Dg Chest 2 View  11/30/2012  *RADIOLOGY REPORT*  Clinical Data: Shortness of breath.  Prior lung cancer.  CHEST - 2 VIEW  Comparison: 09/15/2012  Findings: Changes of prior left pneumonectomy with stable appearance.  Increasing interstitial prominence throughout the right lung.  This could reflect interstitial edema superimposed on chronic interstitial lung disease.  Heart is difficult to visualize with the pneumonectomy changes, likely borderline.  No visible right effusion.  No  acute bony abnormality.  IMPRESSION: Worsening interstitial prominence throughout the right lung, question acute edema superimposed on chronic interstitial lung disease.  Stable post pneumonectomy changes on the left.   Original Report Authenticated By: Charlett Nose, M.D.    Ct Chest W Contrast  12/14/2012  *RADIOLOGY REPORT*  Clinical Data: Lung cancer diagnosed 2005, status post left pneumonectomy, chemotherapy and XRT complete  CT CHEST WITH CONTRAST  Technique:  Multidetector CT imaging of the chest was performed following the standard protocol during bolus administration of intravenous contrast.  Contrast: 80mL OMNIPAQUE IOHEXOL 300 MG/ML  SOLN  Comparison: CTA chest dated 04/06/2012  Findings: Stable postsurgical changes related to left  pneumonectomy.  Centrilobular emphysematous changes in the right lung.  No suspicious pulmonary nodules.  No pneumothorax.  The visualized thyroid is unremarkable.  Leftward cardiomediastinal shift.  No pericardial effusion. Coronary atherosclerosis with stents.  Atherosclerotic calcifications of the aortic arch.  12 mm short-axis right infrahilar node (series 2/image 28), decreased.  No suspicious mediastinal or axillary lymphadenopathy.  Visualized upper abdomen is notable for an 8 mm hyperenhancing lesion along the hepatic dome (series 2/image 42), unchanged, likely reflecting a benign hemangioma.  Mild asymmetry of the left serratus anterior muscles (series 2/image 29), similar to prior studies, likely postsurgical.  Mild degenerative changes of the visualized thoracolumbar spine.  IMPRESSION: Status post left pneumonectomy.  No evidence of recurrent or metastatic disease in the chest.  12 mm short-axis right infrahilar node, decreased.   Original Report Authenticated By: Charline Bills, M.D.     ASSESSMENT: This is a very pleasant 57 years old African American male with history of stage IIIa non-small cell lung cancer status post neoadjuvant concurrent  chemoradiation followed by left pneumonectomy and has been observation since January of 2005 with no evidence for disease recurrence.  PLAN: I discussed the scan results with the patient today. I recommended for him to continue on observation with his primary care physician. The patient is now almost 10 years from his time of initial diagnosis in 2004. I will discharge him from the clinic and ask him to followup with his primary care physician with an annual chest x-ray. I'd be happy to see him in the future if needed. The patient agreed to the current plan All questions were answered. The patient knows to call the clinic with any problems, questions or concerns. We can certainly see the patient much sooner if necessary.

## 2012-12-21 NOTE — Patient Instructions (Signed)
No evidence for disease recurrence on the recent scan of the chest. Followup with your primary care physician.

## 2013-05-24 ENCOUNTER — Other Ambulatory Visit (HOSPITAL_COMMUNITY): Payer: Self-pay | Admitting: Internal Medicine

## 2013-05-24 ENCOUNTER — Ambulatory Visit (HOSPITAL_COMMUNITY)
Admission: RE | Admit: 2013-05-24 | Discharge: 2013-05-24 | Disposition: A | Discharge status: Home / Self Care | Payer: Medicare Other | Source: Ambulatory Visit | Attending: Internal Medicine | Admitting: Internal Medicine

## 2013-05-24 DIAGNOSIS — R071 Chest pain on breathing: Secondary | ICD-10-CM | POA: Insufficient documentation

## 2013-05-24 DIAGNOSIS — Z902 Acquired absence of lung [part of]: Secondary | ICD-10-CM | POA: Insufficient documentation

## 2013-07-31 ENCOUNTER — Telehealth: Payer: Self-pay | Admitting: *Deleted

## 2013-07-31 NOTE — Telephone Encounter (Signed)
Patient presents to office to request physician provide a letter regarding his cancer condition and that he is unable to stand or walk for a long time. Filled out Walk-In form and left. Attempted to call him to obtain more info as to why he needs such documentation, but his phone # was not available and was not able to reach his emergency contact either. Note forwarded to MD.

## 2013-09-11 ENCOUNTER — Telehealth (HOSPITAL_COMMUNITY): Payer: Self-pay | Admitting: Cardiac Rehabilitation

## 2013-09-11 NOTE — Telephone Encounter (Signed)
Referral received from Dr Sharyn Lull to participate in cardiac rehab. Unfortunately pt diagnosis doesn't meet insurance criteria for coverage.  Attempt to contact pt to enroll in cardiac maintenance program. Phone not in service. Dr. Sharyn Lull made aware.

## 2013-10-14 ENCOUNTER — Emergency Department (HOSPITAL_COMMUNITY): Payer: Medicare Other

## 2013-10-14 ENCOUNTER — Encounter (HOSPITAL_COMMUNITY): Payer: Self-pay | Admitting: Emergency Medicine

## 2013-10-14 ENCOUNTER — Emergency Department (HOSPITAL_COMMUNITY)
Admission: EM | Admit: 2013-10-14 | Discharge: 2013-10-14 | Disposition: A | Discharge status: Home / Self Care | Payer: Medicare Other | Attending: Emergency Medicine | Admitting: Emergency Medicine

## 2013-10-14 DIAGNOSIS — Z7982 Long term (current) use of aspirin: Secondary | ICD-10-CM | POA: Insufficient documentation

## 2013-10-14 DIAGNOSIS — Z79899 Other long term (current) drug therapy: Secondary | ICD-10-CM | POA: Insufficient documentation

## 2013-10-14 DIAGNOSIS — Z923 Personal history of irradiation: Secondary | ICD-10-CM | POA: Insufficient documentation

## 2013-10-14 DIAGNOSIS — J441 Chronic obstructive pulmonary disease with (acute) exacerbation: Secondary | ICD-10-CM | POA: Insufficient documentation

## 2013-10-14 DIAGNOSIS — Z7902 Long term (current) use of antithrombotics/antiplatelets: Secondary | ICD-10-CM | POA: Insufficient documentation

## 2013-10-14 DIAGNOSIS — I2510 Atherosclerotic heart disease of native coronary artery without angina pectoris: Secondary | ICD-10-CM | POA: Insufficient documentation

## 2013-10-14 DIAGNOSIS — Z880 Allergy status to penicillin: Secondary | ICD-10-CM | POA: Insufficient documentation

## 2013-10-14 DIAGNOSIS — F17200 Nicotine dependence, unspecified, uncomplicated: Secondary | ICD-10-CM | POA: Insufficient documentation

## 2013-10-14 DIAGNOSIS — I509 Heart failure, unspecified: Secondary | ICD-10-CM | POA: Insufficient documentation

## 2013-10-14 DIAGNOSIS — J811 Chronic pulmonary edema: Secondary | ICD-10-CM | POA: Insufficient documentation

## 2013-10-14 DIAGNOSIS — Z85118 Personal history of other malignant neoplasm of bronchus and lung: Secondary | ICD-10-CM | POA: Insufficient documentation

## 2013-10-14 DIAGNOSIS — Z9861 Coronary angioplasty status: Secondary | ICD-10-CM | POA: Insufficient documentation

## 2013-10-14 DIAGNOSIS — IMO0002 Reserved for concepts with insufficient information to code with codable children: Secondary | ICD-10-CM | POA: Insufficient documentation

## 2013-10-14 LAB — CBC
MCH: 30.1 pg (ref 26.0–34.0)
MCHC: 35.7 g/dL (ref 30.0–36.0)
MCV: 84.5 fL (ref 78.0–100.0)
Platelets: 282 10*3/uL (ref 150–400)
RDW: 15.5 % (ref 11.5–15.5)
WBC: 8.1 10*3/uL (ref 4.0–10.5)

## 2013-10-14 LAB — BASIC METABOLIC PANEL
BUN: 9 mg/dL (ref 6–23)
CO2: 24 mEq/L (ref 19–32)
Chloride: 99 mEq/L (ref 96–112)
Creatinine, Ser: 0.92 mg/dL (ref 0.50–1.35)
GFR calc Af Amer: >90 mL/min (ref >90)
GFR calc non Af Amer: >90 mL/min (ref >90)

## 2013-10-14 LAB — PRO B NATRIURETIC PEPTIDE: Pro B Natriuretic peptide (BNP): 617.4 pg/mL — ABNORMAL HIGH (ref 0–125)

## 2013-10-14 MED ORDER — ALBUTEROL (5 MG/ML) CONTINUOUS INHALATION SOLN
15.0000 mg | INHALATION_SOLUTION | RESPIRATORY_TRACT | Status: AC
  Administered 2013-10-14: 15 mg via RESPIRATORY_TRACT
  Filled 2013-10-14: qty 20

## 2013-10-14 MED ORDER — PREDNISONE 20 MG PO TABS: 60.0000 mg | ORAL_TABLET | Freq: Every day | ORAL | Status: DC

## 2013-10-14 MED ORDER — ALBUTEROL SULFATE HFA 108 (90 BASE) MCG/ACT IN AERS: 2.0000 | INHALATION_SPRAY | RESPIRATORY_TRACT | Status: DC | PRN

## 2013-10-14 MED ORDER — ASPIRIN 325 MG PO TABS
325.0000 mg | ORAL_TABLET | ORAL | Status: DC
  Filled 2013-10-14: qty 1

## 2013-10-14 MED ORDER — IOHEXOL 350 MG/ML SOLN
100.0000 mL | Freq: Once | INTRAVENOUS | Status: AC | PRN
  Administered 2013-10-14: 100 mL via INTRAVENOUS

## 2013-10-14 MED ORDER — FUROSEMIDE 10 MG/ML IJ SOLN
40.0000 mg | Freq: Once | INTRAMUSCULAR | Status: AC
  Administered 2013-10-14: 40 mg via INTRAVENOUS
  Filled 2013-10-14: qty 4

## 2013-10-14 MED ORDER — METHYLPREDNISOLONE SODIUM SUCC 125 MG IJ SOLR
125.0000 mg | Freq: Once | INTRAMUSCULAR | Status: AC
  Administered 2013-10-14: 125 mg via INTRAVENOUS
  Filled 2013-10-14: qty 2

## 2013-10-14 MED ORDER — IPRATROPIUM BROMIDE 0.02 % IN SOLN: 0.5000 mg | RESPIRATORY_TRACT | Status: DC

## 2013-10-14 MED ORDER — ASPIRIN 81 MG PO CHEW
324.0000 mg | CHEWABLE_TABLET | Freq: Once | ORAL | Status: AC
  Administered 2013-10-14: 324 mg via ORAL
  Filled 2013-10-14: qty 4

## 2013-10-14 MED ORDER — IPRATROPIUM BROMIDE 0.02 % IN SOLN
1.5000 mg | Freq: Once | RESPIRATORY_TRACT | Status: AC
  Administered 2013-10-14: 1.5 mg via RESPIRATORY_TRACT
  Filled 2013-10-14: qty 7.5

## 2013-10-14 MED ORDER — ALBUTEROL SULFATE (5 MG/ML) 0.5% IN NEBU: 5.0000 mg | INHALATION_SOLUTION | RESPIRATORY_TRACT | Status: DC

## 2013-10-14 NOTE — ED Provider Notes (Signed)
TIME SEEN: 2:58 PM  CHIEF COMPLAINT: Chest pain, shortness of breath  HPI: Patient is a 57 year old male with a history of CAD s/p three stents, continued tobacco use, CHF, COPD, lung cancer 11 years ago status post chemotherapy and radiation and left pneumonectomy on January 2005 who presents emergency department with one month of cough with white sputum production and one week of shortness of breath. He states she's also having a "spasm" in his left and right chest with deep inspiration that lasts only several seconds and then is gone.  Cough and SOB are better when he lies on his left side.  No radiation of pain.   He states his shortness of breath started one week ago after bending over. He reports a sudden onset of shortness of breath. He states these symptoms are different than his prior episodes of angina.  Denies h/o PE or DVT.  No LE swelling or pain.  No fever.  ROS: See HPI Constitutional: no fever  Eyes: no drainage  ENT: no runny nose   Cardiovascular:  chest pain  Resp:  SOB  GI: no vomiting GU: no dysuria Integumentary: no rash  Allergy: no hives  Musculoskeletal: no leg swelling  Neurological: no slurred speech ROS otherwise negative  PAST MEDICAL HISTORY/PAST SURGICAL HISTORY:  Past Medical History  Diagnosis Date  . COPD (chronic obstructive pulmonary disease)   . CHF (congestive heart failure)   . lung ca dx'd 2005    chemo/xrt comp 2005, lung ca    MEDICATIONS:  Prior to Admission medications   Medication Sig Start Date End Date Taking? Authorizing Provider  aspirin EC 81 MG EC tablet Take 1 tablet (81 mg total) by mouth daily. 12/06/12  Yes Robynn Pane, MD  atorvastatin (LIPITOR) 20 MG tablet Take 20 mg by mouth daily.   Yes Historical Provider, MD  carvedilol (COREG) 3.125 MG tablet Take 3.125 mg by mouth 2 (two) times daily with a meal.   Yes Historical Provider, MD  Fluticasone-Salmeterol (ADVAIR) 250-50 MCG/DOSE AEPB Inhale 1 puff into the lungs 2 (two)  times daily. 09/18/12  Yes Catarina Hartshorn, MD  furosemide (LASIX) 40 MG tablet Take 40 mg by mouth daily. 12/06/12  Yes Robynn Pane, MD  montelukast (SINGULAIR) 10 MG tablet Take 10 mg by mouth at bedtime.   Yes Historical Provider, MD  prasugrel (EFFIENT) 10 MG TABS Take 10 mg by mouth daily. 04/08/12  Yes Nishant Dhungel, MD  ramipril (ALTACE) 1.25 MG capsule Take 1.25 mg by mouth daily. 04/08/12 04/08/13  Theda Belfast Dhungel, MD    ALLERGIES:  Allergies  Allergen Reactions  . Penicillins Itching    SOCIAL HISTORY:  History  Substance Use Topics  . Smoking status: Current Some Day Smoker    Types: Cigarettes  . Smokeless tobacco: Never Used  . Alcohol Use: No     Comment: former    FAMILY HISTORY: Family History  Problem Relation Age of Onset  . Hypertension Other   . Diabetes Other     EXAM: BP 101/65  Pulse 85  Temp(Src) 98.7 F (37.1 C)  Resp 18  SpO2 96% CONSTITUTIONAL: Alert and oriented and responds appropriately to questions. Well-appearing; well-nourished HEAD: Normocephalic EYES: Conjunctivae clear, PERRL ENT: normal nose; no rhinorrhea; moist mucous membranes; pharynx without lesions noted NECK: Supple, no meningismus, no LAD  CARD: RRR; S1 and S2 appreciated; no murmurs, no clicks, no rubs, no gallops RESP: Normal chest excursion without splinting; mild tachypnea, oxygen saturation 97% on RA,  diffuse expiratory wheeze and rhonchi on the right, diminished breath sounds on the left diffusely ABD/GI: Normal bowel sounds; non-distended; soft, non-tender, no rebound, no guarding BACK:  The back appears normal and is non-tender to palpation, there is no CVA tenderness EXT: Normal ROM in all joints; non-tender to palpation; no edema; normal capillary refill; no cyanosis    SKIN: Normal color for age and race; warm NEURO: Moves all extremities equally PSYCH: The patient's mood and manner are appropriate. Grooming and personal hygiene are appropriate.  MEDICAL DECISION  MAKING: Labs are unremarkable other than mild elevation of BNP at 617.  Troponin negative. Chest x-ray shows mild R pulm edema, L pneumonectomy.  Will give duonebs, solumedrol given patient's wheezing and history of COPD. We'll also obtain CT angio chest to rule out PE given patient's history of lung cancer and a sudden onset of symptoms after bending over one week ago.  ED PROGRESS: CT scan shows mild interstitial edema but no pulmonary embolus or infiltrate. Patient also has a hilar lymph node which may be from recurrent malignancy versus due to volume overload and a repeat CT scan was recommended in 3 months. Given patient's edema, will give IV Lasix.  Patient has some improvement in his aeration and wheezing with continuous nebulizer treatment. Will reassess when treatment completed.   Pt's R lung is clear to aeration with increased air movement after nebulizer treatments.  Sats > 95% on RA and no respiratory distress.  Pt reports feeling much better.  He does not have a nebulizer machine at home. We'll discharge home with albuterol inhaler, prednisone. Given return precautions. Instructed patient to followup with his primary care physician next week. Patient verbalizes understanding and is comfortable with plan.      EKG Interpretation    Date/Time:  Sunday October 14 2013 14:13:28 EST Ventricular Rate:  80 PR Interval:  165 QRS Duration: 118 QT Interval:  417 QTC Calculation: 481 R Axis:   -54 Text Interpretation:  Sinus rhythm LVH with IVCD, LAD and secondary repol abnrm Borderline prolonged QT interval No significant change since last tracing Confirmed by Tiahna Cure  DO, Dimitris Shanahan ([id number]) on 10/14/2013 3:33:19 PM             Layla Maw Amarilys Lyles, DO 10/14/13 1746

## 2013-10-14 NOTE — ED Notes (Addendum)
Pt reports left side chest pain for 3-4 days; also sts productive cough with clear mucous. Pt reports hx of lung cancer and "extensive heart disease with stents". Pt sts had left lung removed due to cancer.

## 2013-10-14 NOTE — ED Notes (Signed)
Patient transported to CT 

## 2013-10-30 ENCOUNTER — Encounter (HOSPITAL_COMMUNITY): Payer: Self-pay | Admitting: Emergency Medicine

## 2013-10-30 ENCOUNTER — Inpatient Hospital Stay (HOSPITAL_COMMUNITY)
Admission: EM | Admit: 2013-10-30 | Discharge: 2013-11-01 | DRG: 293 | Disposition: A | Discharge status: Home Health | Payer: Medicare Other | Attending: Cardiology | Admitting: Cardiology

## 2013-10-30 ENCOUNTER — Emergency Department (HOSPITAL_COMMUNITY): Payer: Medicare Other

## 2013-10-30 DIAGNOSIS — Z833 Family history of diabetes mellitus: Secondary | ICD-10-CM

## 2013-10-30 DIAGNOSIS — Z923 Personal history of irradiation: Secondary | ICD-10-CM

## 2013-10-30 DIAGNOSIS — Z9221 Personal history of antineoplastic chemotherapy: Secondary | ICD-10-CM

## 2013-10-30 DIAGNOSIS — I5022 Chronic systolic (congestive) heart failure: Secondary | ICD-10-CM | POA: Diagnosis present

## 2013-10-30 DIAGNOSIS — Z85118 Personal history of other malignant neoplasm of bronchus and lung: Secondary | ICD-10-CM

## 2013-10-30 DIAGNOSIS — F17200 Nicotine dependence, unspecified, uncomplicated: Secondary | ICD-10-CM | POA: Diagnosis present

## 2013-10-30 DIAGNOSIS — I2510 Atherosclerotic heart disease of native coronary artery without angina pectoris: Secondary | ICD-10-CM | POA: Diagnosis present

## 2013-10-30 DIAGNOSIS — I2589 Other forms of chronic ischemic heart disease: Secondary | ICD-10-CM | POA: Diagnosis present

## 2013-10-30 DIAGNOSIS — I5021 Acute systolic (congestive) heart failure: Principal | ICD-10-CM | POA: Diagnosis present

## 2013-10-30 DIAGNOSIS — Z7982 Long term (current) use of aspirin: Secondary | ICD-10-CM

## 2013-10-30 DIAGNOSIS — Z8249 Family history of ischemic heart disease and other diseases of the circulatory system: Secondary | ICD-10-CM

## 2013-10-30 DIAGNOSIS — J4489 Other specified chronic obstructive pulmonary disease: Secondary | ICD-10-CM | POA: Diagnosis present

## 2013-10-30 DIAGNOSIS — I509 Heart failure, unspecified: Secondary | ICD-10-CM

## 2013-10-30 DIAGNOSIS — Z902 Acquired absence of lung [part of]: Secondary | ICD-10-CM

## 2013-10-30 DIAGNOSIS — Z9861 Coronary angioplasty status: Secondary | ICD-10-CM

## 2013-10-30 DIAGNOSIS — I10 Essential (primary) hypertension: Secondary | ICD-10-CM | POA: Diagnosis present

## 2013-10-30 DIAGNOSIS — Z79899 Other long term (current) drug therapy: Secondary | ICD-10-CM

## 2013-10-30 DIAGNOSIS — J449 Chronic obstructive pulmonary disease, unspecified: Secondary | ICD-10-CM | POA: Diagnosis present

## 2013-10-30 DIAGNOSIS — E7800 Pure hypercholesterolemia, unspecified: Secondary | ICD-10-CM | POA: Diagnosis present

## 2013-10-30 HISTORY — DX: Atherosclerotic heart disease of native coronary artery without angina pectoris: I25.10

## 2013-10-30 HISTORY — DX: Essential (primary) hypertension: I10

## 2013-10-30 HISTORY — DX: Shortness of breath: R06.02

## 2013-10-30 HISTORY — DX: Acute myocardial infarction, unspecified: I21.9

## 2013-10-30 LAB — COMPREHENSIVE METABOLIC PANEL
Albumin: 3.8 g/dL (ref 3.5–5.2)
Alkaline Phosphatase: 109 U/L (ref 39–117)
BUN: 15 mg/dL (ref 6–23)
Calcium: 9.2 mg/dL (ref 8.4–10.5)
Chloride: 102 mEq/L (ref 96–112)
GFR calc non Af Amer: >90 mL/min (ref >90)
Potassium: 3.4 mEq/L — ABNORMAL LOW (ref 3.5–5.1)
Sodium: 141 mEq/L (ref 135–145)
Total Bilirubin: 0.6 mg/dL (ref 0.3–1.2)

## 2013-10-30 LAB — BASIC METABOLIC PANEL
BUN: 16 mg/dL (ref 6–23)
CO2: 22 mEq/L (ref 19–32)
Chloride: 110 mEq/L (ref 96–112)
GFR calc non Af Amer: >90 mL/min (ref >90)
Glucose, Bld: 97 mg/dL (ref 70–99)
Potassium: 3.8 mEq/L (ref 3.5–5.1)
Sodium: 141 mEq/L (ref 135–145)

## 2013-10-30 LAB — CBC WITH DIFFERENTIAL/PLATELET
Basophils Relative: 1 % (ref 0–1)
Eosinophils Absolute: 0.1 10*3/uL (ref 0.0–0.7)
Hemoglobin: 14.2 g/dL (ref 13.0–17.0)
Lymphs Abs: 1.5 10*3/uL (ref 0.7–4.0)
MCH: 30.7 pg (ref 26.0–34.0)
Monocytes Relative: 8 % (ref 3–12)
Neutro Abs: 5 10*3/uL (ref 1.7–7.7)
Neutrophils Relative %: 70 % (ref 43–77)
Platelets: 308 10*3/uL (ref 150–400)
RBC: 4.62 MIL/uL (ref 4.22–5.81)
RDW: 16.1 % — ABNORMAL HIGH (ref 11.5–15.5)
WBC: 7.1 10*3/uL (ref 4.0–10.5)

## 2013-10-30 LAB — PROTIME-INR: INR: 1.07 (ref 0.00–1.49)

## 2013-10-30 LAB — CBC
HCT: 36.3 % — ABNORMAL LOW (ref 39.0–52.0)
Hemoglobin: 12.6 g/dL — ABNORMAL LOW (ref 13.0–17.0)
MCH: 30.2 pg (ref 26.0–34.0)
MCHC: 34.7 g/dL (ref 30.0–36.0)
MCV: 87.1 fL (ref 78.0–100.0)
RBC: 4.17 MIL/uL — ABNORMAL LOW (ref 4.22–5.81)

## 2013-10-30 LAB — APTT: aPTT: 36 seconds (ref 24–37)

## 2013-10-30 LAB — PRO B NATRIURETIC PEPTIDE: Pro B Natriuretic peptide (BNP): 1515 pg/mL — ABNORMAL HIGH (ref 0–125)

## 2013-10-30 LAB — TROPONIN I: Troponin I: <0.3 ng/mL (ref <0.30)

## 2013-10-30 MED ORDER — HEPARIN SODIUM (PORCINE) 5000 UNIT/ML IJ SOLN
5000.0000 [IU] | Freq: Three times a day (TID) | INTRAMUSCULAR | Status: DC
  Administered 2013-10-30 – 2013-11-01 (×6): 5000 [IU] via SUBCUTANEOUS
  Filled 2013-10-30 (×9): qty 1

## 2013-10-30 MED ORDER — ONDANSETRON HCL 4 MG/2ML IJ SOLN: 4.0000 mg | Freq: Four times a day (QID) | INTRAMUSCULAR | Status: DC | PRN

## 2013-10-30 MED ORDER — POTASSIUM CHLORIDE ER 10 MEQ PO TBCR
20.0000 meq | EXTENDED_RELEASE_TABLET | Freq: Every day | ORAL | Status: DC
  Filled 2013-10-30: qty 2

## 2013-10-30 MED ORDER — LEVALBUTEROL HCL 1.25 MG/0.5ML IN NEBU
1.2500 mg | INHALATION_SOLUTION | Freq: Three times a day (TID) | RESPIRATORY_TRACT | Status: DC
  Administered 2013-10-30 – 2013-11-01 (×5): 1.25 mg via RESPIRATORY_TRACT
  Filled 2013-10-30 (×10): qty 0.5

## 2013-10-30 MED ORDER — NITROGLYCERIN 2 % TD OINT
0.5000 [in_us] | TOPICAL_OINTMENT | Freq: Three times a day (TID) | TRANSDERMAL | Status: DC
  Administered 2013-10-30: 10:00:00 via TOPICAL
  Filled 2013-10-30: qty 1

## 2013-10-30 MED ORDER — FUROSEMIDE 10 MG/ML IJ SOLN
40.0000 mg | INTRAMUSCULAR | Status: AC
  Administered 2013-10-30: 40 mg via INTRAVENOUS
  Filled 2013-10-30: qty 4

## 2013-10-30 MED ORDER — FUROSEMIDE 10 MG/ML IJ SOLN
40.0000 mg | Freq: Every day | INTRAMUSCULAR | Status: DC
  Administered 2013-10-30 – 2013-11-01 (×3): 40 mg via INTRAVENOUS
  Filled 2013-10-30 (×3): qty 4

## 2013-10-30 MED ORDER — ASPIRIN EC 81 MG PO TBEC
81.0000 mg | DELAYED_RELEASE_TABLET | Freq: Every day | ORAL | Status: DC
  Administered 2013-10-31 – 2013-11-01 (×2): 81 mg via ORAL
  Filled 2013-10-30 (×2): qty 1

## 2013-10-30 MED ORDER — RAMIPRIL 1.25 MG PO CAPS
1.2500 mg | ORAL_CAPSULE | Freq: Every day | ORAL | Status: DC
  Administered 2013-10-30 – 2013-11-01 (×3): 1.25 mg via ORAL
  Filled 2013-10-30 (×3): qty 1

## 2013-10-30 MED ORDER — ASPIRIN EC 81 MG PO TBEC: 81.0000 mg | DELAYED_RELEASE_TABLET | Freq: Every day | ORAL | Status: DC

## 2013-10-30 MED ORDER — PRASUGREL HCL 10 MG PO TABS
10.0000 mg | ORAL_TABLET | Freq: Every day | ORAL | Status: DC
  Administered 2013-10-30 – 2013-11-01 (×3): 10 mg via ORAL
  Filled 2013-10-30 (×3): qty 1

## 2013-10-30 MED ORDER — NITROGLYCERIN 0.4 MG SL SUBL: 0.4000 mg | SUBLINGUAL_TABLET | SUBLINGUAL | Status: DC | PRN

## 2013-10-30 MED ORDER — MONTELUKAST SODIUM 10 MG PO TABS
10.0000 mg | ORAL_TABLET | Freq: Every day | ORAL | Status: DC
  Administered 2013-10-30 – 2013-10-31 (×2): 10 mg via ORAL
  Filled 2013-10-30 (×3): qty 1

## 2013-10-30 MED ORDER — ALBUTEROL SULFATE (5 MG/ML) 0.5% IN NEBU
5.0000 mg | INHALATION_SOLUTION | Freq: Once | RESPIRATORY_TRACT | Status: AC
  Administered 2013-10-30: 5 mg via RESPIRATORY_TRACT
  Filled 2013-10-30: qty 1

## 2013-10-30 MED ORDER — LEVOFLOXACIN 750 MG PO TABS
750.0000 mg | ORAL_TABLET | ORAL | Status: DC
  Administered 2013-10-30 – 2013-11-01 (×3): 750 mg via ORAL
  Filled 2013-10-30 (×3): qty 1

## 2013-10-30 MED ORDER — ATORVASTATIN CALCIUM 80 MG PO TABS
80.0000 mg | ORAL_TABLET | Freq: Every day | ORAL | Status: DC
  Administered 2013-10-30 – 2013-10-31 (×2): 80 mg via ORAL
  Filled 2013-10-30 (×3): qty 1

## 2013-10-30 MED ORDER — ASPIRIN 81 MG PO CHEW
324.0000 mg | CHEWABLE_TABLET | ORAL | Status: AC
  Administered 2013-10-30: 12:00:00 324 mg via ORAL
  Filled 2013-10-30: qty 4

## 2013-10-30 MED ORDER — ASPIRIN 300 MG RE SUPP
300.0000 mg | RECTAL | Status: AC
  Filled 2013-10-30: qty 1

## 2013-10-30 MED ORDER — ACETAMINOPHEN 325 MG PO TABS
650.0000 mg | ORAL_TABLET | ORAL | Status: DC | PRN
  Administered 2013-10-30 – 2013-10-31 (×2): 650 mg via ORAL
  Filled 2013-10-30 (×2): qty 2

## 2013-10-30 MED ORDER — CARVEDILOL 3.125 MG PO TABS
3.1250 mg | ORAL_TABLET | Freq: Two times a day (BID) | ORAL | Status: DC
Start: 2013-10-30 — End: 2013-11-01
  Administered 2013-10-31 (×2): 3.125 mg via ORAL
  Filled 2013-10-30 (×6): qty 1

## 2013-10-30 MED ORDER — POTASSIUM CHLORIDE CRYS ER 20 MEQ PO TBCR
20.0000 meq | EXTENDED_RELEASE_TABLET | Freq: Every day | ORAL | Status: DC
  Administered 2013-10-30 – 2013-11-01 (×3): 20 meq via ORAL
  Filled 2013-10-30 (×3): qty 1

## 2013-10-30 NOTE — ED Notes (Signed)
Patient with increased shortness of breath in the last few days.  Patient does have pneumonectomy on the left.  Patient has been coughing with productive yellow mucus.  Denies any chest pain.

## 2013-10-30 NOTE — ED Provider Notes (Signed)
Date: 10/30/2013  Rate: 85  Rhythm: normal sinus rhythm  QRS Axis: left  Intervals: normal  ST/T Wave abnormalities: nonspecific ST/T changes  Conduction Disutrbances:left anterior fascicular block and LVH  Narrative Interpretation:   Old EKG Reviewed: unchanged    Gilda Crease, MD 10/30/13 [phone]

## 2013-10-30 NOTE — Progress Notes (Signed)
Pt SBP 96. MD page. Nitro paste d/c. BP meds hold for sbp < 100. Info added to [facility]. Will continue to monitor Pt

## 2013-10-30 NOTE — Care Management Note (Addendum)
  Page 2 of 2   11/01/2013     11:42:14 AM   CARE MANAGEMENT NOTE 11/01/2013  Patient:  Ronald [name], Ronald [name]   Account Number:  [id number]  Date Initiated:  10/30/2013  Documentation initiated by:  Oletta Cohn  Subjective/Objective Assessment:   57 y.o. male.  Chief Complaint: Chest pain/shortness of breath//Home alone- children     Action/Plan:   IV diureases.//Home with HH   Anticipated DC Date:  11/03/2013   Anticipated DC Plan:  HOME W HOME HEALTH SERVICES      DC Planning Services  CM consult      Choice offered to / List presented to:          [facility] arranged  HH-1 RN  HH-10 DISEASE MANAGEMENT      HH agency  [facility]   Status of service:  Completed, signed off Medicare Important Message given?   (If response is "NO", the following Medicare IM given date fields will be blank) Date Medicare IM given:   Date Additional Medicare IM given:    Discharge Disposition:    Per UR Regulation:    If discussed at Long Length of Stay Meetings, dates discussed:    Comments:  11/01/13 Oletta Cohn, RN, BSN, NCM [phone] Spoke with pt at bedside regarding discharge planning for Home Health Services. Offered pt list of home health agencies to choose from.  Pt chose [facility] to render services of RN and telehealth. Kizzie Furnish, RN of [facility] notified.  No DME needs identified at this time.  10/30/13 Oletta Cohn, RN, BSN, [place] [phone]    Contact Information  Name                              Relation Mobile  [place]             Brother [phone]

## 2013-10-30 NOTE — Progress Notes (Addendum)
Called report from ED 1016, Rn unavailable at time. Rec PT ed @ 1050. Pt O4x, no complaints of CP or sob. Pt on 2l, cardiac monitor connected. HF booklet given. Pt states he has been eating to much salt, Will continue to monitor

## 2013-10-30 NOTE — H&P (Signed)
Ronald [name] is an 57 y.o. male.   Chief Complaint: Chest pain/shortness of breath HPI: Patient is 57 year old male with past medical history significant for multiple medical problems i.e. coronary artery disease status post multivessel PCI to LAD left circumflex and ramus branch in the past, hypertension, valvular heart disease, hypercholesteremia, history of recurrent congestive heart failure secondary to systolic dysfunction/valvular heart disease, history of non-small cell CA of left lung status post left pneumonectomy in the past, tobacco abuse, history of cocaine abuse, came to the ER complaining of vague retrosternal and left-sided sharp chest pain associated with shortness of breath for last 2-3 days. Patient also gives history of PND orthopnea but denies any leg swelling states he ran out of Lasix 3 days ago. Patient also complains of cough with whitish yellow phlegm but denies any fever or chills. Denies any nausea or vomiting or diaphoresis. Denies palpitation lightheadedness or syncopal episode. Patient states lately he is unable to lay flat and his breathing has progressively gotten worse so decided to come to ED.  Past Medical History  Diagnosis Date  . COPD (chronic obstructive pulmonary disease)   . CHF (congestive heart failure)   . lung ca dx'd 2005    chemo/xrt comp 2005, lung ca    Past Surgical History  Procedure Laterality Date  . Coronary angioplasty with stent placement    . Pneumonectomy  2005    left    Family History  Problem Relation Age of Onset  . Hypertension Other   . Diabetes Other    Social History:  reports that he has been smoking Cigarettes.  He has been smoking about 0.00 packs per day. He has never used smokeless tobacco. He reports that he uses illicit drugs (Cocaine). He reports that he does not drink alcohol.  Allergies:  Allergies  Allergen Reactions  . Penicillins Itching     (Not in a hospital admission)  Results for orders placed  during the hospital encounter of 10/30/13 (from the past 48 hour(s))  BASIC METABOLIC PANEL     Status: None   Collection Time    10/30/13  6:02 AM      Result Value Range   Sodium 141  135 - 145 mEq/L   Potassium 3.8  3.5 - 5.1 mEq/L   Chloride 110  96 - 112 mEq/L   CO2 22  19 - 32 mEq/L   Glucose, Bld 97  70 - 99 mg/dL   BUN 16  6 - 23 mg/dL   Creatinine, Ser 1.61  0.50 - 1.35 mg/dL   Calcium 8.6  8.4 - 09.6 mg/dL   GFR calc non Af Amer >90  >90 mL/min   GFR calc Af Amer >90  >90 mL/min   Comment: (NOTE)     The eGFR has been calculated using the CKD EPI equation.     This calculation has not been validated in all clinical situations.     eGFR's persistently <90 mL/min signify possible Chronic Kidney     Disease.  CBC     Status: Abnormal   Collection Time    10/30/13  6:02 AM      Result Value Range   WBC 6.7  4.0 - 10.5 K/uL   RBC 4.17 (*) 4.22 - 5.81 MIL/uL   Hemoglobin 12.6 (*) 13.0 - 17.0 g/dL   HCT 04.5 (*) 40.9 - 81.1 %   MCV 87.1  78.0 - 100.0 fL   MCH 30.2  26.0 - 34.0 pg  MCHC 34.7  30.0 - 36.0 g/dL   RDW 16.1 (*) 09.6 - 04.5 %   Platelets 271  150 - 400 K/uL  POCT I-STAT TROPONIN I     Status: None   Collection Time    10/30/13  6:11 AM      Result Value Range   Troponin i, poc 0.02  0.00 - 0.08 ng/mL   Comment 3            Comment: Due to the release kinetics of cTnI,     a negative result within the first hours     of the onset of symptoms does not rule out     myocardial infarction with certainty.     If myocardial infarction is still suspected,     repeat the test at appropriate intervals.  PRO B NATRIURETIC PEPTIDE     Status: Abnormal   Collection Time    10/30/13  6:28 AM      Result Value Range   Pro B Natriuretic peptide (BNP) 1515.0 (*) 0 - 125 pg/mL   Dg Chest 2 View (if Patient Has Fever And/or Copd)  10/30/2013   CLINICAL DATA:  Shortness of breath  EXAM: CHEST  2 VIEW  COMPARISON:  10/14/2013  FINDINGS: Left pneumonectomy with leftward  shift of the mediastinum. Coronary arterial stent is seen. No evidence of new cardiac enlargement. Fullness of the right hilum which is chronic; nodal based on previous CT 10/14/2013. New interstitial opacities on the right, with Kerley B-lines and small pleural effusion. No acute osseous findings. Distortion of the left hemi thorax related to thoracotomy.  IMPRESSION: Pulmonary edema in the remaining right lung. Small right pleural effusion.   Electronically Signed   By: Tiburcio Pea M.D.   On: 10/30/2013 06:57    Review of Systems  Constitutional: Negative for fever and chills.  Eyes: Negative for blurred vision and double vision.  Respiratory: Positive for cough, sputum production and shortness of breath. Negative for wheezing.   Cardiovascular: Positive for chest pain, orthopnea and PND. Negative for claudication and leg swelling.  Gastrointestinal: Negative for nausea, vomiting, abdominal pain and diarrhea.  Genitourinary: Negative for dysuria.  Neurological: Negative for dizziness and headaches.    Blood pressure 105/78, pulse 82, resp. rate 20, SpO2 98.00%. Physical Exam  Constitutional: He is oriented to person, place, and time.  HENT:  Head: Normocephalic and atraumatic.  Eyes: Conjunctivae are normal. Left eye exhibits no discharge. No scleral icterus.  Neck: Normal range of motion. Neck supple. JVD present. No thyromegaly present.  Cardiovascular: Normal rate and regular rhythm.   Murmur (soft systolic and diastolic murmur and S3 gallop noted) heard. Respiratory:  Absent breath sounds left lung field. Decreased breath sounds at right base with rhonchi and rales noted  GI: Soft. Bowel sounds are normal. He exhibits no distension. There is no tenderness. There is no rebound.  Musculoskeletal: He exhibits no edema and no tenderness.  Neurological: He is alert and oriented to person, place, and time.     Assessment/Plan Atypical chest pain rule out MI Decompensated acute  systolic heart failure Coronary artery disease history of multiple PCI's in the past Bronchitis rule out pneumonia Ischemic cardiomyopathy Valvular heart disease Hypertension Hypercholesteremia Tobacco abuse History of cocaine abuse History of non-small cell CA of left lung status post left pneumonectomy Plan As per orders  Kortlyn Koltz N 10/30/2013, 9:30 AM

## 2013-10-30 NOTE — ED Provider Notes (Signed)
CSN: [id number]     Arrival date & time 10/30/13  0543 History   First MD Initiated Contact with Patient 10/30/13 0601     Chief Complaint  Patient presents with  . Shortness of Breath   (Consider location/radiation/quality/duration/timing/severity/associated sxs/prior Treatment) HPI Comments: Patient is a 57 year old male with a history of CAD s/p three stents, continued tobacco use, CHF, COPD, lung cancer 11 years ago status post chemotherapy and radiation and left pneumonectomy on January 2005 who presents emergency department with a chief complaint of shortness of breath x2-3 days. Patient states that he has also had a cough times one month. He states it is productive for yellow mucus. He states that he has run out of his Lasix. He states that "something just does not feel right." Additionally, he states that yesterday he had an episode of chest pain. He has not experienced any chest pain since yesterday. He denies any fevers, chills, nausea, or vomiting.  The history is provided by the patient. No language interpreter was used.    Past Medical History  Diagnosis Date  . COPD (chronic obstructive pulmonary disease)   . CHF (congestive heart failure)   . lung ca dx'd 2005    chemo/xrt comp 2005, lung ca   Past Surgical History  Procedure Laterality Date  . Coronary angioplasty with stent placement    . Pneumonectomy  2005    left   Family History  Problem Relation Age of Onset  . Hypertension Other   . Diabetes Other    History  Substance Use Topics  . Smoking status: Current Some Day Smoker    Types: Cigarettes  . Smokeless tobacco: Never Used  . Alcohol Use: No     Comment: former    Review of Systems  All other systems reviewed and are negative.    Allergies  Penicillins  Home Medications   Current Outpatient Rx  Name  Route  Sig  Dispense  Refill  . albuterol (PROVENTIL HFA;VENTOLIN HFA) 108 (90 BASE) MCG/ACT inhaler   Inhalation   Inhale 2 puffs into the  lungs every 4 (four) hours as needed for wheezing or shortness of breath.   1 Inhaler   0   . aspirin EC 81 MG EC tablet   Oral   Take 1 tablet (81 mg total) by mouth daily.   30 tablet   3   . atorvastatin (LIPITOR) 20 MG tablet   Oral   Take 20 mg by mouth daily.         . carvedilol (COREG) 3.125 MG tablet   Oral   Take 3.125 mg by mouth 2 (two) times daily with a meal.         . Fluticasone-Salmeterol (ADVAIR) 250-50 MCG/DOSE AEPB   Inhalation   Inhale 1 puff into the lungs 2 (two) times daily.         . furosemide (LASIX) 40 MG tablet   Oral   Take 40 mg by mouth daily.         . montelukast (SINGULAIR) 10 MG tablet   Oral   Take 10 mg by mouth at bedtime.         . prasugrel (EFFIENT) 10 MG TABS   Oral   Take 10 mg by mouth daily.         . predniSONE (DELTASONE) 20 MG tablet   Oral   Take 3 tablets (60 mg total) by mouth daily.   12 tablet   0   .  EXPIRED: ramipril (ALTACE) 1.25 MG capsule   Oral   Take 1.25 mg by mouth daily.          There were no vitals taken for this visit. Physical Exam  Nursing note and vitals reviewed. Constitutional: He is oriented to person, place, and time. He appears well-developed and well-nourished.  HENT:  Head: Normocephalic and atraumatic.  Eyes: Conjunctivae and EOM are normal. Pupils are equal, round, and reactive to light. Right eye exhibits no discharge. Left eye exhibits no discharge. No scleral icterus.  Neck: Normal range of motion. Neck supple. No JVD present.  Cardiovascular: Normal rate, regular rhythm, normal heart sounds and intact distal pulses.  Exam reveals no gallop and no friction rub.   No murmur heard. Pulmonary/Chest: Effort normal and breath sounds normal. No respiratory distress. He has no wheezes. He has no rales. He exhibits no tenderness.  Abdominal: Soft. Bowel sounds are normal. He exhibits no distension and no mass. There is no tenderness. There is no rebound and no guarding.   Musculoskeletal: Normal range of motion. He exhibits no edema and no tenderness.  Neurological: He is alert and oriented to person, place, and time.  Skin: Skin is warm and dry.  Psychiatric: He has a normal mood and affect. His behavior is normal. Judgment and thought content normal.    ED Course  Procedures (including critical care time) Results for orders placed during the hospital encounter of 10/30/13  BASIC METABOLIC PANEL      Result Value Range   Sodium 141  135 - 145 mEq/L   Potassium 3.8  3.5 - 5.1 mEq/L   Chloride 110  96 - 112 mEq/L   CO2 22  19 - 32 mEq/L   Glucose, Bld 97  70 - 99 mg/dL   BUN 16  6 - 23 mg/dL   Creatinine, Ser 1.61  0.50 - 1.35 mg/dL   Calcium 8.6  8.4 - 09.6 mg/dL   GFR calc non Af Amer >90  >90 mL/min   GFR calc Af Amer >90  >90 mL/min  CBC      Result Value Range   WBC 6.7  4.0 - 10.5 K/uL   RBC 4.17 (*) 4.22 - 5.81 MIL/uL   Hemoglobin 12.6 (*) 13.0 - 17.0 g/dL   HCT 04.5 (*) 40.9 - 81.1 %   MCV 87.1  78.0 - 100.0 fL   MCH 30.2  26.0 - 34.0 pg   MCHC 34.7  30.0 - 36.0 g/dL   RDW 91.4 (*) 78.2 - 95.6 %   Platelets 271  150 - 400 K/uL  PRO B NATRIURETIC PEPTIDE      Result Value Range   Pro B Natriuretic peptide (BNP) 1515.0 (*) 0 - 125 pg/mL  POCT I-STAT TROPONIN I      Result Value Range   Troponin i, poc 0.02  0.00 - 0.08 ng/mL   Comment 3            Dg Chest 2 View (if Patient Has Fever And/or Copd)  10/30/2013   CLINICAL DATA:  Shortness of breath  EXAM: CHEST  2 VIEW  COMPARISON:  10/14/2013  FINDINGS: Left pneumonectomy with leftward shift of the mediastinum. Coronary arterial stent is seen. No evidence of new cardiac enlargement. Fullness of the right hilum which is chronic; nodal based on previous CT 10/14/2013. New interstitial opacities on the right, with Kerley B-lines and small pleural effusion. No acute osseous findings. Distortion of the left hemi thorax related  to thoracotomy.  IMPRESSION: Pulmonary edema in the remaining  right lung. Small right pleural effusion.   Electronically Signed   By: Tiburcio Pea M.D.   On: 10/30/2013 06:57   Dg Chest 2 View  10/14/2013   CLINICAL DATA:  57 year old male with shortness of breath, cough, chest tightness. History of lung cancer and left lung surgery.  EXAM: CHEST  2 VIEW  COMPARISON:  05/24/2013 and earlier.  FINDINGS: Sequelae of left pneumonectomy. Stable left hemi thorax. Stable right lung volume. No pneumothorax, pulmonary edema, or right pleural effusion. Coarsening of the right lung markings about the hilum again noted but not significantly changed over exams from the past year. No definite acute opacity. Stable visualized osseous structures.  IMPRESSION: Stable postoperative appearance status post left pneumonectomy. No acute cardiopulmonary abnormality.   Electronically Signed   By: Augusto Gamble M.D.   On: 10/14/2013 15:35   Ct Angio Chest Pe W/cm &/or Wo Cm  10/14/2013   ADDENDUM REPORT: 10/14/2013 16:19  ADDENDUM: Interlobular septal thickening at the lung bases likely represents mild interstitial edema. And interstitial inflammatory process is not excluded. In addition, the enlarged hilar node may represent volume overload rather than malignancy recurrence. Consider 3 month followup chest CT to ensure resolution of the enlarged node.   Electronically Signed   By: Maryclare Bean M.D.   On: 10/14/2013 16:19   10/14/2013   CLINICAL DATA:  Chest pain  EXAM: CT ANGIOGRAPHY CHEST WITH CONTRAST  TECHNIQUE: Multidetector CT imaging of the chest was performed using the standard protocol during bolus administration of intravenous contrast. Multiplanar CT image reconstructions including MIPs were obtained to evaluate the vascular anatomy.  CONTRAST:  OMNIPAQUE IOHEXOL 350 MG/ML SOLN  COMPARISON:  12/14/2012  FINDINGS: No filling defects in the pulmonary arterial tree to suggest acute pulmonary thromboembolism.  A right hilar node has enlarged. Today it measures 17 mm in short axis  diameter. Previously, it was 12 mm.  Status post left pneumonectomy with expected postoperative changes.  Moderate centrilobular emphysema throughout the right lung. Interlobular septal thickening at the lung bases is nonspecific.  No acute bony deformity.  Left serratus anterior muscle remains prominent.  Review of the MIP images confirms the above findings.  IMPRESSION: No evidence of acute pulmonary thromboembolism.  Enlarging right hilar node.  Malignancy recurrence is not excluded.  Electronically Signed: By: Maryclare Bean M.D. On: 10/14/2013 16:14     EKG Interpretation   None       MDM  No diagnosis found.  Patient with shortness of breath, and CHF. Patient also has a pneumonectomy. Recheck basic labs, give IV Lasix, as the patient has not had any Lasix secondary to running out of his medications.  Patient is short of breath.  He is not able to lay down.  He has edema in his right lung with a small pleural effusion.  Recommend admission for CHF exacerbation.  7:36 AM Patient seen by and discussed with Dr. Blinda Leatherwood.  Will consult Dr. Sharyn Lull, who is the patient's cardiologist.  7:46 AM Discussed the patient with Dr. Sharyn Lull, who will come see the patient.  Roxy Horseman, PA-C 10/30/13 1553

## 2013-10-30 NOTE — ED Notes (Signed)
PA at bedside.  Advised ok to give PO fluids.  Pt give water to drink.

## 2013-10-30 NOTE — ED Notes (Signed)
MD at bedside. 

## 2013-10-31 LAB — LIPID PANEL
Cholesterol: 108 mg/dL (ref 0–200)
LDL Cholesterol: 60 mg/dL (ref 0–99)
VLDL: 14 mg/dL (ref 0–40)

## 2013-10-31 LAB — BASIC METABOLIC PANEL
BUN: 19 mg/dL (ref 6–23)
CO2: 22 mEq/L (ref 19–32)
Calcium: 8.4 mg/dL (ref 8.4–10.5)
Chloride: 103 mEq/L (ref 96–112)
GFR calc non Af Amer: >90 mL/min (ref >90)
Glucose, Bld: 127 mg/dL — ABNORMAL HIGH (ref 70–99)
Potassium: 3.7 mEq/L (ref 3.5–5.1)
Sodium: 137 mEq/L (ref 135–145)

## 2013-10-31 LAB — CBC
HCT: 37.8 % — ABNORMAL LOW (ref 39.0–52.0)
Hemoglobin: 13.3 g/dL (ref 13.0–17.0)
MCH: 30.7 pg (ref 26.0–34.0)
MCHC: 35.2 g/dL (ref 30.0–36.0)
MCV: 87.3 fL (ref 78.0–100.0)
RBC: 4.33 MIL/uL (ref 4.22–5.81)
WBC: 6.6 10*3/uL (ref 4.0–10.5)

## 2013-10-31 LAB — GLUCOSE, CAPILLARY: Glucose-Capillary: 121 mg/dL — ABNORMAL HIGH (ref 70–99)

## 2013-10-31 LAB — PRO B NATRIURETIC PEPTIDE: Pro B Natriuretic peptide (BNP): 799.5 pg/mL — ABNORMAL HIGH (ref 0–125)

## 2013-10-31 NOTE — Progress Notes (Signed)
Subjective:  Complains of vague left-sided chest pain without associated symptoms. States breathing has improved  Objective:  Vital Signs in the last 24 hours: Temp:  [97.7 F (36.5 C)-99.2 F (37.3 C)] 98.1 F (36.7 C) (12/17 1341) Pulse Rate:  [77-84] 84 (12/17 1341) Resp:  [18-20] 20 (12/17 1341) BP: (87-97)/(59-68) 91/63 mmHg (12/17 1341) SpO2:  [95 %-100 %] 99 % (12/17 1341) Weight:  [64.229 kg (141 lb 9.6 oz)] 64.229 kg (141 lb 9.6 oz) (12/17 0500)  Intake/Output from previous day: 12/16 0701 - 12/17 0700 In: 1462 [P.O.:1462] Out: 3450 [Urine:3450] Intake/Output from this shift: Total I/O In: 480 [P.O.:480] Out: 1220 [Urine:1220]  Physical Exam: Neck: no adenopathy, no carotid bruit, no JVD and supple, symmetrical, trachea midline Lungs: Absent breasts are lung field decreased breath sound at the right base with faint rales Heart: regular rate and rhythm, S1, S2 normal and Soft systolic murmur and S3 gallop noted Abdomen: soft, non-tender; bowel sounds normal; no masses,  no organomegaly Extremities: extremities normal, atraumatic, no cyanosis or edema  Lab Results:  Recent Labs  10/30/13 1235 10/31/13 0445  WBC 7.1 6.6  HGB 14.2 13.3  PLT 308 273    Recent Labs  10/30/13 1235 10/31/13 0445  NA 141 137  K 3.4* 3.7  CL 102 103  CO2 26 22  GLUCOSE 140* 127*  BUN 15 19  CREATININE 0.92 0.97    Recent Labs  10/30/13 1646 10/30/13 2225  TROPONINI <0.30 <0.30   Hepatic Function Panel  Recent Labs  10/30/13 1235  PROT 8.0  ALBUMIN 3.8  AST 57*  ALT 55*  ALKPHOS 109  BILITOT 0.6    Recent Labs  10/31/13 0500  CHOL 108   No results found for this basename: PROTIME,  in the last 72 hours  Imaging: Imaging results have been reviewed and Dg Chest 2 View (if Patient Has Fever And/or Copd)  10/30/2013   CLINICAL DATA:  Shortness of breath  EXAM: CHEST  2 VIEW  COMPARISON:  10/14/2013  FINDINGS: Left pneumonectomy with leftward shift of the  mediastinum. Coronary arterial stent is seen. No evidence of new cardiac enlargement. Fullness of the right hilum which is chronic; nodal based on previous CT 10/14/2013. New interstitial opacities on the right, with Kerley B-lines and small pleural effusion. No acute osseous findings. Distortion of the left hemi thorax related to thoracotomy.  IMPRESSION: Pulmonary edema in the remaining right lung. Small right pleural effusion.   Electronically Signed   By: Tiburcio Pea M.D.   On: 10/30/2013 06:57    Cardiac Studies:  Assessment/Plan:  Atypical chest pain MI ruled out Resolving Decompensated acute systolic heart failure  Coronary artery disease history of multiple PCI's in the past  Bronchitis rule out pneumonia  Ischemic cardiomyopathy  Valvular heart disease  Hypertension  Hypercholesteremia  Tobacco abuse  History of cocaine abuse  History of non-small cell CA of left lung status post left pneumonectomy  Plan As per orders Check labs in a.m.  LOS: 1 day    Jadarian Mckay N 10/31/2013, 4:48 PM

## 2013-10-31 NOTE — ED Provider Notes (Signed)
Medical screening examination/treatment/procedure(s) were conducted as a shared visit with non-physician practitioner(s) and myself.  I personally evaluated the patient during the encounter.  EKG Interpretation    Date/Time:    Ventricular Rate:    PR Interval:    QRS Duration:   QT Interval:    QTC Calculation:   R Axis:     Text Interpretation:              Patient seen and evaluated for shortness of breath. Patient appears to be very volume overloaded. He does have a history of congestive heart failure. No evidence of MI or ischemia. Patient to be admitted by cardiology.  Gilda Crease, MD 10/31/13 [phone]

## 2013-10-31 NOTE — Plan of Care (Signed)
Problem: Phase I Progression Outcomes Goal: Hemodynamically stable Outcome: Progressing Monitoring BP closely.  Meds adjusted for SBP less than 100.  Continues to receive IV Lasix.  Will continue to monitor patient condition.

## 2013-11-01 LAB — CBC
HCT: 36.7 % — ABNORMAL LOW (ref 39.0–52.0)
Hemoglobin: 12.5 g/dL — ABNORMAL LOW (ref 13.0–17.0)
MCH: 29.3 pg (ref 26.0–34.0)
RBC: 4.26 MIL/uL (ref 4.22–5.81)
WBC: 5.1 10*3/uL (ref 4.0–10.5)

## 2013-11-01 LAB — BASIC METABOLIC PANEL
BUN: 16 mg/dL (ref 6–23)
Calcium: 8.4 mg/dL (ref 8.4–10.5)
Chloride: 102 mEq/L (ref 96–112)
GFR calc non Af Amer: >90 mL/min (ref >90)
Glucose, Bld: 99 mg/dL (ref 70–99)
Potassium: 3.9 mEq/L (ref 3.5–5.1)
Sodium: 135 mEq/L (ref 135–145)

## 2013-11-01 LAB — PRO B NATRIURETIC PEPTIDE: Pro B Natriuretic peptide (BNP): 604 pg/mL — ABNORMAL HIGH (ref 0–125)

## 2013-11-01 MED ORDER — ATORVASTATIN CALCIUM 80 MG PO TABS: 80.0000 mg | ORAL_TABLET | Freq: Every day | ORAL | Status: DC

## 2013-11-01 MED ORDER — NITROGLYCERIN 0.4 MG SL SUBL: 0.4000 mg | SUBLINGUAL_TABLET | SUBLINGUAL | Status: DC | PRN

## 2013-11-01 MED ORDER — LEVOFLOXACIN 750 MG PO TABS: 750.0000 mg | ORAL_TABLET | ORAL | Status: DC

## 2013-11-01 MED ORDER — RAMIPRIL 1.25 MG PO CAPS: 1.2500 mg | ORAL_CAPSULE | Freq: Every day | ORAL | Status: DC

## 2013-11-01 NOTE — Discharge Summary (Signed)
  Discharge summary dictated on 11/01/2013 dictation number is [id number]

## 2013-11-01 NOTE — Progress Notes (Signed)
BP 93/60. Asymptomatic. Harwani notified.  Hold Coreg per MD order. RN will continue to monitor. Louretta Parma, RN

## 2013-11-02 NOTE — Discharge Summary (Signed)
NAMEGUILLAUME, [name]             ACCOUNT NO.:  [id number]  MEDICAL RECORD NO.:  [id number]  LOCATION:  4E29C                        FACILITY:  [facility]  PHYSICIAN:  Victoriana Aziz N. Sharyn [name], M.D. DATE OF BIRTH:  1956/08/01  DATE OF ADMISSION:  10/30/2013 DATE OF DISCHARGE:  11/01/2013                              DISCHARGE SUMMARY   ADMITTING DIAGNOSES: 1. Atypical chest pain rule out myocardial infarction, decompensated     acute systolic heart failure. 2. Coronary artery disease, history of multiple percutaneous coronary     intervention in the past. 3. Bronchitis, rule out pneumonia. 4. Ischemic cardiomyopathy. 5. Valvular heart disease. 6. Hypertension. 7. Hypercholesteremia. 8. Tobacco abuse. 9. History of cocaine abuse. 10.History of non-small cell CA of left lung, status post left     pneumonectomy in the past.  DISCHARGE DIAGNOSES: 1. Status post atypical chest pain, myocardial infarction ruled out,     compensated systolic heart failure. 2. Coronary artery disease, history of multiple percutaneous coronary     intervention in the past. 3. Resolving bronchitis. 4. Ischemic cardiomyopathy. 5. Valvular heart disease. 6. Hypertension. 7. Hypercholesteremia. 8. Tobacco abuse. 9. History of cocaine abuse. 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  DISCHARGE HOME MEDICATIONS: 1. Atorvastatin 80 mg 1 tablet daily. 2. Levofloxacin 750 mg 1 tablet daily for 5 more days. 3. Nitrostat 0.4 mg sublingual use as directed. 4. Albuterol inhaler 2 puffs every 4 hours as before. 5. Aspirin 81 mg 1 tablet daily as before. 6. Carvedilol 3.125 mg twice daily. 7. Lasix 40 mg daily. 8. Singular 10 mg daily. 9. Prasugrel 10 mg daily. 10.Ramipril 1.25 mg 1 capsule daily.  DIET:  Low salt, low cholesterol.  ACTIVITY:  Increase activity slowly as tolerated.  DISCHARGE INSTRUCTIONS:  Heart failure instructions have been  given. Follow up with me in 1 week.  CONDITION AT DISCHARGE:  Stable.  BRIEF HISTORY AND HOSPITAL COURSE:  Ronald [name] is a 57 year old male with past medical history significant for multiple medical problems, i.e., coronary artery disease status post multiple PCIs to LAD and left circumflex, and ramus branch in the past, hypertension, valvular heart disease, hypercholesteremia, history of recurrent congestive heart failure secondary to systolic dysfunction/valvular heart disease, history of non-small cell CA of left lung status post left pneumonectomy in the past, tobacco abuse, and history of cocaine abuse.  He came to the ER complaining of vague retrosternal and left-sided sharp chest pain associated with shortness of breath for last 2-3 days.  The patient also gives history of PND and orthopnea, but denies any leg swelling.  States he ran out of Lasix 3 days ago.  The patient also complains of cough with whitish yellow phlegm, but denies any fever or chills.  Denies any nausea, vomiting, diaphoresis.  Denies palpitation, lightheadedness, or syncope.  The patient states lately he is unable to lay flat and his breathing has progressively gotten worse, so decided to come to the ED.  PHYSICAL EXAMINATION:  GENERAL:  He was alert, awake, and oriented x3. VITAL SIGNS:  Blood pressure was 105/78, pulse was 82.  Conjunctivae was pink. NECK:  Supple.  Positive JVD.  No bruit. LUNGS:  He had absent breath sounds in the left lung field decreased and breath sounds at right base with rhonchi and rales. CARDIOVASCULAR:  S1 and S2 was normal.  There was soft systolic and diastolic murmur and S3 gallop. ABDOMEN:  Soft.  Bowel sounds were present.  Nontender. EXTREMITIES:  No clubbing, cyanosis, or edema.  LABORATORY DATA:  Sodium was 141, potassium 3.8, BUN 16, creatinine 0.81.  Three sets of troponin-I were normal.  ProBNP was 1515, repeat was 799, today is 604 which is trending down.   Cholesterol was 108, triglycerides 70, HDL 34, LDL was 60.  Hemoglobin was 12.6, hematocrit 36.3, white count of 6.7.  Chest x-ray showed pulmonary edema and the remaining right lung with small right pleural effusion.  EKG showed normal sinus rhythm with left anterior fascicular block, septal Q-waves, LVH with secondary ST-T wave changes.  BRIEF HOSPITAL COURSE:  The patient was admitted to telemetry unit.  MI was ruled out by serial enzymes and EKG.  The patient did not have any episodes of chest pain during the hospital stay.  His home meds were restarted.  Lasix p.o. was switched to IV with good diuresis.  The patient has been ambulating in the hallway without any problems and is eager to go home.  The patient will be discharged home on above medications and will be followed up in my office in 1 week.     Ronald [name], M.D.     [facility]  D:  11/01/2013  T:  11/02/2013  Job:  [id number]

## 2014-03-11 ENCOUNTER — Telehealth: Payer: Self-pay | Admitting: Medical Oncology

## 2014-03-11 NOTE — Telephone Encounter (Signed)
Said Dr Reginia Naas wants him to f/u with [facility].

## 2014-03-13 NOTE — Telephone Encounter (Signed)
I spoke to nurse at Dr Lenn Cal. She will make referral .

## 2014-03-29 ENCOUNTER — Telehealth: Payer: Self-pay | Admitting: Medical Oncology

## 2014-03-29 NOTE — Telephone Encounter (Signed)
I requested notes, cxr from PCP. Given to Dr Julien Nordmann.

## 2014-04-12 ENCOUNTER — Telehealth: Payer: Self-pay | Admitting: *Deleted

## 2014-04-12 NOTE — Telephone Encounter (Signed)
Received referral for patient from Dr Joycie Peek at [facility].  Dr Vista Mink reviewed notes, per Dr Vista Mink no need for follow up at this time, PCP ok to follow up.  Left a message with information at [phone].  SLJ

## 2014-09-20 ENCOUNTER — Other Ambulatory Visit (HOSPITAL_COMMUNITY): Payer: Self-pay | Admitting: Cardiology

## 2014-09-20 DIAGNOSIS — R079 Chest pain, unspecified: Secondary | ICD-10-CM

## 2014-09-25 ENCOUNTER — Ambulatory Visit (HOSPITAL_COMMUNITY): Admission: RE | Admit: 2014-09-25 | Payer: Medicare Other | Source: Ambulatory Visit

## 2014-09-25 ENCOUNTER — Encounter (HOSPITAL_COMMUNITY): Payer: Medicare Other

## 2014-09-25 ENCOUNTER — Encounter (HOSPITAL_COMMUNITY)
Admission: RE | Admit: 2014-09-25 | Discharge: 2014-09-25 | Disposition: A | Discharge status: Home / Self Care | Payer: Medicare Other | Source: Ambulatory Visit | Attending: Cardiology | Admitting: Cardiology

## 2014-09-25 MED ORDER — REGADENOSON 0.4 MG/5ML IV SOLN: 0.4000 mg | Freq: Once | INTRAVENOUS | Status: DC

## 2014-10-02 ENCOUNTER — Encounter (HOSPITAL_COMMUNITY)
Admission: RE | Admit: 2014-10-02 | Discharge: 2014-10-02 | Disposition: A | Discharge status: Home / Self Care | Payer: Medicare Other | Source: Ambulatory Visit | Attending: Cardiology | Admitting: Cardiology

## 2014-10-02 ENCOUNTER — Encounter (HOSPITAL_COMMUNITY): Payer: Medicare Other

## 2014-10-02 DIAGNOSIS — R079 Chest pain, unspecified: Secondary | ICD-10-CM | POA: Insufficient documentation

## 2014-10-02 MED ORDER — REGADENOSON 0.4 MG/5ML IV SOLN
0.4000 mg | Freq: Once | INTRAVENOUS | Status: AC
  Administered 2014-10-02: 0.4 mg via INTRAVENOUS

## 2014-10-02 MED ORDER — TECHNETIUM TC 99M SESTAMIBI GENERIC - CARDIOLITE
30.0000 | Freq: Once | INTRAVENOUS | Status: AC | PRN
  Administered 2014-10-02: 30 via INTRAVENOUS

## 2014-10-02 MED ORDER — REGADENOSON 0.4 MG/5ML IV SOLN
INTRAVENOUS | Status: AC
  Filled 2014-10-02: qty 5

## 2014-10-02 MED ORDER — TECHNETIUM TC 99M SESTAMIBI GENERIC - CARDIOLITE
10.0000 | Freq: Once | INTRAVENOUS | Status: AC | PRN
  Administered 2014-10-02: 10 via INTRAVENOUS

## 2014-10-24 ENCOUNTER — Encounter (HOSPITAL_COMMUNITY): Payer: Self-pay | Admitting: Cardiology

## 2015-02-26 ENCOUNTER — Encounter (HOSPITAL_COMMUNITY): Payer: Self-pay | Admitting: Physical Medicine and Rehabilitation

## 2015-02-26 ENCOUNTER — Emergency Department (HOSPITAL_COMMUNITY): Payer: Medicare Other

## 2015-02-26 ENCOUNTER — Emergency Department (HOSPITAL_COMMUNITY)
Admission: EM | Admit: 2015-02-26 | Discharge: 2015-02-26 | Disposition: A | Discharge status: Home / Self Care | Payer: Medicare Other | Attending: Emergency Medicine | Admitting: Emergency Medicine

## 2015-02-26 DIAGNOSIS — I10 Essential (primary) hypertension: Secondary | ICD-10-CM | POA: Insufficient documentation

## 2015-02-26 DIAGNOSIS — I252 Old myocardial infarction: Secondary | ICD-10-CM | POA: Insufficient documentation

## 2015-02-26 DIAGNOSIS — R079 Chest pain, unspecified: Secondary | ICD-10-CM

## 2015-02-26 DIAGNOSIS — Z87891 Personal history of nicotine dependence: Secondary | ICD-10-CM | POA: Diagnosis not present

## 2015-02-26 DIAGNOSIS — Z85118 Personal history of other malignant neoplasm of bronchus and lung: Secondary | ICD-10-CM | POA: Insufficient documentation

## 2015-02-26 DIAGNOSIS — I2510 Atherosclerotic heart disease of native coronary artery without angina pectoris: Secondary | ICD-10-CM | POA: Insufficient documentation

## 2015-02-26 DIAGNOSIS — G8929 Other chronic pain: Secondary | ICD-10-CM | POA: Diagnosis not present

## 2015-02-26 DIAGNOSIS — I509 Heart failure, unspecified: Secondary | ICD-10-CM | POA: Insufficient documentation

## 2015-02-26 DIAGNOSIS — J449 Chronic obstructive pulmonary disease, unspecified: Secondary | ICD-10-CM | POA: Insufficient documentation

## 2015-02-26 DIAGNOSIS — R05 Cough: Secondary | ICD-10-CM | POA: Insufficient documentation

## 2015-02-26 DIAGNOSIS — Z7982 Long term (current) use of aspirin: Secondary | ICD-10-CM | POA: Insufficient documentation

## 2015-02-26 DIAGNOSIS — Z79899 Other long term (current) drug therapy: Secondary | ICD-10-CM | POA: Diagnosis not present

## 2015-02-26 DIAGNOSIS — R0789 Other chest pain: Secondary | ICD-10-CM

## 2015-02-26 DIAGNOSIS — Z880 Allergy status to penicillin: Secondary | ICD-10-CM | POA: Diagnosis not present

## 2015-02-26 LAB — CBC WITH DIFFERENTIAL/PLATELET
BASOS PCT: 1 % (ref 0–1)
Basophils Absolute: 0.1 10*3/uL (ref 0.0–0.1)
EOS ABS: 0.1 10*3/uL (ref 0.0–0.7)
EOS PCT: 2 % (ref 0–5)
HCT: 44.4 % (ref 39.0–52.0)
Hemoglobin: 15.7 g/dL (ref 13.0–17.0)
LYMPHS PCT: 25 % (ref 12–46)
Lymphs Abs: 1.5 10*3/uL (ref 0.7–4.0)
MCH: 30 pg (ref 26.0–34.0)
MCHC: 35.4 g/dL (ref 30.0–36.0)
MCV: 84.7 fL (ref 78.0–100.0)
Monocytes Absolute: 0.4 10*3/uL (ref 0.1–1.0)
Monocytes Relative: 7 % (ref 3–12)
NEUTROS PCT: 65 % (ref 43–77)
Neutro Abs: 3.9 10*3/uL (ref 1.7–7.7)
PLATELETS: 312 10*3/uL (ref 150–400)
RBC: 5.24 MIL/uL (ref 4.22–5.81)
RDW: 15.1 % (ref 11.5–15.5)
WBC: 6 10*3/uL (ref 4.0–10.5)

## 2015-02-26 LAB — I-STAT TROPONIN, ED: Troponin i, poc: 0.01 ng/mL (ref 0.00–0.08)

## 2015-02-26 LAB — COMPREHENSIVE METABOLIC PANEL
ALT: 15 U/L (ref 0–53)
ANION GAP: 11 (ref 5–15)
AST: 21 U/L (ref 0–37)
Albumin: 3.8 g/dL (ref 3.5–5.2)
Alkaline Phosphatase: 92 U/L (ref 39–117)
BILIRUBIN TOTAL: 0.4 mg/dL (ref 0.3–1.2)
BUN: 22 mg/dL (ref 6–23)
CHLORIDE: 104 mmol/L (ref 96–112)
CO2: 22 mmol/L (ref 19–32)
CREATININE: 1.25 mg/dL (ref 0.50–1.35)
Calcium: 9.3 mg/dL (ref 8.4–10.5)
GFR, EST AFRICAN AMERICAN: 72 mL/min — AB (ref >90)
GFR, EST NON AFRICAN AMERICAN: 62 mL/min — AB (ref >90)
GLUCOSE: 99 mg/dL (ref 70–99)
Potassium: 3.9 mmol/L (ref 3.5–5.1)
Sodium: 137 mmol/L (ref 135–145)
Total Protein: 8.5 g/dL — ABNORMAL HIGH (ref 6.0–8.3)

## 2015-02-26 NOTE — ED Notes (Signed)
Pt presents to department for evaluation of R sided rib pain. Ongoing x1 month. Reports pain increases when resting. 5/10 pain upon arrival to ED. Respirations unlabored. Pt is alert and oriented x4.

## 2015-02-26 NOTE — ED Provider Notes (Signed)
CSN: [id number]     Arrival date & time 02/26/15  1810 History   First MD Initiated Contact with Patient 02/26/15 2053     Chief Complaint  Patient presents with  . Chest Pain     (Consider location/radiation/quality/duration/timing/severity/associated sxs/prior Treatment) Patient is a 59 y.o. male presenting with chest pain. The history is provided by the patient. No language interpreter was used.  Chest Pain Pain location:  R lateral chest Pain quality: aching   Pain radiates to:  Does not radiate Pain radiates to the back: no   Pain severity:  Moderate Onset quality:  Gradual Timing:  Intermittent Progression:  Waxing and waning Chronicity:  Chronic Context: at rest   Relieved by:  Rest Worsened by:  Nothing tried Ineffective treatments:  None tried Associated symptoms: cough (chronic, unchanged)   Associated symptoms: no abdominal pain, no altered mental status, no anorexia, no anxiety, no back pain, no diaphoresis, no fatigue, no fever, no headache, no nausea, no numbness, no palpitations, no shortness of breath, no syncope, not vomiting and no weakness   Risk factors: coronary artery disease, high cholesterol, hypertension, male sex, smoking and surgery   Risk factors: no diabetes mellitus, no immobilization and no prior DVT/PE     Past Medical History  Diagnosis Date  . COPD (chronic obstructive pulmonary disease)   . CHF (congestive heart failure)   . lung ca dx'd 2005    chemo/xrt comp 2005, lung ca  . Coronary artery disease   . Myocardial infarction   . Hypertension   . Shortness of breath    Past Surgical History  Procedure Laterality Date  . Coronary angioplasty with stent placement    . Pneumonectomy  2005    left  . Left heart catheterization with coronary angiogram N/A 04/07/2012    Procedure: LEFT HEART CATHETERIZATION WITH CORONARY ANGIOGRAM;  Surgeon: [name], MD;  Location: [facility] CATH LAB;  Service: Cardiovascular;  Laterality: N/A;   Family  History  Problem Relation Age of Onset  . Hypertension Other   . Diabetes Other    History  Substance Use Topics  . Smoking status: Former Smoker    Types: Cigarettes    Quit date: 09/30/2013  . Smokeless tobacco: Never Used  . Alcohol Use: No     Comment: former    Review of Systems  Constitutional: Negative for fever, chills, diaphoresis and fatigue.  Respiratory: Positive for cough (chronic, unchanged). Negative for chest tightness, shortness of breath, wheezing and stridor.   Cardiovascular: Positive for chest pain. Negative for palpitations and syncope.  Gastrointestinal: Negative for nausea, vomiting, abdominal pain and anorexia.  Musculoskeletal: Negative for back pain.  Neurological: Negative for weakness, light-headedness, numbness and headaches.  Psychiatric/Behavioral: Negative for confusion.  All other systems reviewed and are negative.     Allergies  Penicillins  Home Medications   Prior to Admission medications   Medication Sig Start Date End Date Taking? Authorizing Provider  albuterol (PROVENTIL HFA;VENTOLIN HFA) 108 (90 BASE) MCG/ACT inhaler Inhale 2 puffs into the lungs every 4 (four) hours as needed for wheezing or shortness of breath. 10/14/13  Yes Kristen N Ward, DO  aspirin EC 81 MG EC tablet Take 1 tablet (81 mg total) by mouth daily. 12/06/12  Yes [name], MD  atorvastatin (LIPITOR) 80 MG tablet Take 1 tablet (80 mg total) by mouth daily at 6 PM. 11/01/13  Yes [name], MD  carvedilol (COREG) 3.125 MG tablet Take 3.125 mg by mouth 2 (two)  times daily with a meal.   Yes Historical Provider, MD  furosemide (LASIX) 40 MG tablet Take 40 mg by mouth daily. 12/06/12  Yes [name], MD  ibuprofen (ADVIL,MOTRIN) 800 MG tablet Take 800 mg by mouth every 8 (eight) hours as needed for mild pain.   Yes Historical Provider, MD  levofloxacin (LEVAQUIN) 750 MG tablet Take 1 tablet (750 mg total) by mouth daily. Patient not taking: Reported on 02/26/2015  11/01/13   [name], MD  montelukast (SINGULAIR) 10 MG tablet Take 10 mg by mouth at bedtime.   Yes Historical Provider, MD  nitroGLYCERIN (NITROSTAT) 0.4 MG SL tablet Place 1 tablet (0.4 mg total) under the tongue every 5 (five) minutes x 3 doses as needed for chest pain. 11/01/13  Yes [name], MD  prasugrel (EFFIENT) 10 MG TABS Take 10 mg by mouth daily. 04/08/12  Yes Nishant Dhungel, MD  ramipril (ALTACE) 1.25 MG capsule Take 1 capsule (1.25 mg total) by mouth daily. 11/01/13 11/01/14  [name], MD  ramipril (ALTACE) 1.25 MG capsule Take 1.25 mg by mouth daily. 02/17/15  Yes Historical Provider, MD  spironolactone (ALDACTONE) 25 MG tablet Take 25 mg by mouth daily. 02/12/15  Yes Historical Provider, MD  zolpidem (AMBIEN) 10 MG tablet Take 10 mg by mouth at bedtime. 02/16/15  Yes Historical Provider, MD   BP 103/68 mmHg  Pulse 71  Temp(Src) 98.1 F (36.7 C)  Resp 25  SpO2 99% Physical Exam  Constitutional: He is oriented to person, place, and time. Vital signs are normal. He appears well-developed and well-nourished. He is active. He does not appear ill. No distress.  HENT:  Head: Normocephalic and atraumatic.  Nose: Nose normal.  Mouth/Throat: Oropharynx is clear and moist. No oropharyngeal exudate.  Eyes: EOM are normal. Pupils are equal, round, and reactive to light.  Neck: Normal range of motion. Neck supple.  Cardiovascular: Normal rate, regular rhythm, normal heart sounds and intact distal pulses.   No murmur heard. Pulmonary/Chest: Effort normal. No respiratory distress. He has no wheezes. He exhibits no tenderness.  nontender to palpation of chest wall bilaterally.  No skin changes or masses palpable on right chest wall.  Normal work of breathing, lungs clear on right, decreased on left (chronic).    Abdominal: Soft. He exhibits no distension. There is no tenderness. There is no guarding.  Musculoskeletal: Normal range of motion. He exhibits no tenderness.   Neurological: He is alert and oriented to person, place, and time. No cranial nerve deficit. Coordination normal.  Skin: Skin is warm and dry. He is not diaphoretic. No pallor.  Psychiatric: He has a normal mood and affect. His behavior is normal. Judgment and thought content normal.  Nursing note and vitals reviewed.   ED Course  Procedures (including critical care time) Labs Review Labs Reviewed  COMPREHENSIVE METABOLIC PANEL - Abnormal; Notable for the following:    Total Protein 8.5 (*)    GFR calc non Af Amer 62 (*)    GFR calc Af Amer 72 (*)    All other components within normal limits  CBC WITH DIFFERENTIAL/PLATELET  Randolm Idol, ED    Imaging Review Dg Chest 2 View  02/26/2015   CLINICAL DATA:  Chest pain and shortness of breath  EXAM: CHEST  2 VIEW  COMPARISON:  10/30/2013  FINDINGS: Previous left pneumonectomy. There has been resection of the left sixth rib. The right lung is clear. No airspace consolidation.  IMPRESSION: 1. No acute cardiopulmonary abnormalities. 2. Status  post left pneumonectomy.   Electronically Signed   By: Kerby Moors M.D.   On: 02/26/2015 19:14     EKG Interpretation   Date/Time:  Wednesday February 26 2015 21:32:06 EDT Ventricular Rate:  74 PR Interval:  173 QRS Duration: 133 QT Interval:  443 QTC Calculation: 491 R Axis:   -86 Text Interpretation:  Sinus rhythm Nonspecific IVCD with LAD LVH with  secondary repolarization abnormality Anterior Q waves, possibly due to LVH  No significant change since 2014 Confirmed by GOLDSTON  MD, SCOTT ([id number])  on 02/26/2015 9:46:32 PM      MDM   Final diagnoses:  Right-sided chest wall pain  Chronic chest pain    Pt is a 59 yo M with hx of CHF, COPD, CAD, HTN, left lung cancer s/p pneumonectomy, who presents with right lateral rib pain x "30 days".  Patient complains of chronic and unchanged right lateral chest wall pain but today he had "just finally had enough".  No pattern associated with  this intermittent right chest pain.  Not associated with movement, palpation, food intake, time of day, exertion, etc.  Feels like a mild throbbing, nonradiating.   Reportedly very different than his left sided lung cancer or his previous MI.  Has a chronic dry cough which is unchanged.  No SOB, no sputum production, no fevers, no weight loss, no diaphoresis.    Appears well.  Vitals stable, no tachycardia or tachypnea.  No reproducible chest tenderness.    Worked up with CXR and EKG, labs sent in triage.  CXR with chronic changes, but nothing acute.  No opacities.   Seriously doubt PE based on presentation.  Doesn't sound like ACS from history.  No evidence of pneumonia on xray.  Doubt acute emergent pathology.    Advised PRN motrin as needed.  To follow up with PCP.  Discharged in stable condition.    Patient was seen with ED Attending, Dr. Brynda Greathouse, MD    Tori Milks, MD 02/27/15 [place], MD 03/01/15 2006

## 2015-02-26 NOTE — ED Notes (Signed)
Pt denies CP - c/o right side pain x1 month that does not worsen with coughing/deep breathing.

## 2015-02-26 NOTE — Discharge Instructions (Signed)

## 2015-03-06 ENCOUNTER — Emergency Department (HOSPITAL_COMMUNITY): Payer: Medicare Other

## 2015-03-06 ENCOUNTER — Emergency Department (HOSPITAL_COMMUNITY)
Admission: EM | Admit: 2015-03-06 | Discharge: 2015-03-06 | Disposition: A | Discharge status: Home / Self Care | Payer: Medicare Other | Attending: Emergency Medicine | Admitting: Emergency Medicine

## 2015-03-06 ENCOUNTER — Encounter (HOSPITAL_COMMUNITY): Payer: Self-pay | Admitting: Emergency Medicine

## 2015-03-06 DIAGNOSIS — Z85118 Personal history of other malignant neoplasm of bronchus and lung: Secondary | ICD-10-CM | POA: Insufficient documentation

## 2015-03-06 DIAGNOSIS — R0602 Shortness of breath: Secondary | ICD-10-CM | POA: Diagnosis present

## 2015-03-06 DIAGNOSIS — Z880 Allergy status to penicillin: Secondary | ICD-10-CM | POA: Insufficient documentation

## 2015-03-06 DIAGNOSIS — R1011 Right upper quadrant pain: Secondary | ICD-10-CM | POA: Diagnosis not present

## 2015-03-06 DIAGNOSIS — J441 Chronic obstructive pulmonary disease with (acute) exacerbation: Secondary | ICD-10-CM | POA: Diagnosis not present

## 2015-03-06 DIAGNOSIS — Z9861 Coronary angioplasty status: Secondary | ICD-10-CM | POA: Insufficient documentation

## 2015-03-06 DIAGNOSIS — Z87891 Personal history of nicotine dependence: Secondary | ICD-10-CM | POA: Diagnosis not present

## 2015-03-06 DIAGNOSIS — Z79899 Other long term (current) drug therapy: Secondary | ICD-10-CM | POA: Diagnosis not present

## 2015-03-06 DIAGNOSIS — Z7982 Long term (current) use of aspirin: Secondary | ICD-10-CM | POA: Diagnosis not present

## 2015-03-06 DIAGNOSIS — I252 Old myocardial infarction: Secondary | ICD-10-CM | POA: Insufficient documentation

## 2015-03-06 DIAGNOSIS — I10 Essential (primary) hypertension: Secondary | ICD-10-CM | POA: Insufficient documentation

## 2015-03-06 DIAGNOSIS — R1010 Upper abdominal pain, unspecified: Secondary | ICD-10-CM

## 2015-03-06 DIAGNOSIS — I509 Heart failure, unspecified: Secondary | ICD-10-CM | POA: Diagnosis not present

## 2015-03-06 DIAGNOSIS — I2510 Atherosclerotic heart disease of native coronary artery without angina pectoris: Secondary | ICD-10-CM | POA: Diagnosis not present

## 2015-03-06 LAB — HEPATIC FUNCTION PANEL
ALT: 16 U/L (ref 0–53)
AST: 23 U/L (ref 0–37)
Albumin: 3.5 g/dL (ref 3.5–5.2)
Alkaline Phosphatase: 80 U/L (ref 39–117)
Bilirubin, Direct: <0.1 mg/dL (ref 0.0–0.5)
TOTAL PROTEIN: 7.5 g/dL (ref 6.0–8.3)
Total Bilirubin: 0.4 mg/dL (ref 0.3–1.2)

## 2015-03-06 LAB — BASIC METABOLIC PANEL
ANION GAP: 6 (ref 5–15)
BUN: 10 mg/dL (ref 6–23)
CO2: 23 mmol/L (ref 19–32)
Calcium: 8.7 mg/dL (ref 8.4–10.5)
Chloride: 108 mmol/L (ref 96–112)
Creatinine, Ser: 0.83 mg/dL (ref 0.50–1.35)
GFR calc Af Amer: >90 mL/min (ref >90)
GFR calc non Af Amer: >90 mL/min (ref >90)
GLUCOSE: 91 mg/dL (ref 70–99)
Potassium: 3.6 mmol/L (ref 3.5–5.1)
Sodium: 137 mmol/L (ref 135–145)

## 2015-03-06 LAB — CBC
HEMATOCRIT: 40.2 % (ref 39.0–52.0)
Hemoglobin: 13.9 g/dL (ref 13.0–17.0)
MCH: 30.2 pg (ref 26.0–34.0)
MCHC: 34.6 g/dL (ref 30.0–36.0)
MCV: 87.2 fL (ref 78.0–100.0)
PLATELETS: 311 10*3/uL (ref 150–400)
RBC: 4.61 MIL/uL (ref 4.22–5.81)
RDW: 15.2 % (ref 11.5–15.5)
WBC: 6.2 10*3/uL (ref 4.0–10.5)

## 2015-03-06 LAB — I-STAT TROPONIN, ED: Troponin i, poc: 0.01 ng/mL (ref 0.00–0.08)

## 2015-03-06 LAB — LIPASE, BLOOD: Lipase: 19 U/L (ref 11–59)

## 2015-03-06 LAB — BRAIN NATRIURETIC PEPTIDE: B NATRIURETIC PEPTIDE 5: 362.4 pg/mL — AB (ref 0.0–100.0)

## 2015-03-06 MED ORDER — FENTANYL CITRATE (PF) 100 MCG/2ML IJ SOLN
50.0000 ug | Freq: Once | INTRAMUSCULAR | Status: AC
  Administered 2015-03-06: 50 ug via INTRAVENOUS
  Filled 2015-03-06: qty 2

## 2015-03-06 MED ORDER — FUROSEMIDE 40 MG PO TABS: 40.0000 mg | ORAL_TABLET | Freq: Two times a day (BID) | ORAL | Status: DC

## 2015-03-06 NOTE — ED Notes (Signed)
Pickering at bedside.

## 2015-03-06 NOTE — ED Notes (Signed)
US at bedside

## 2015-03-06 NOTE — ED Notes (Signed)
Ronald [name] at bedside at present time attempting IV start and blood draw.

## 2015-03-06 NOTE — ED Notes (Addendum)
Pt reports worsening SOB for a month with new onset right ribcage pain onset last night. Pt reports left lung removed and chronic productive cough.

## 2015-03-06 NOTE — ED Provider Notes (Signed)
CSN: [id number]     Arrival date & time 03/06/15  7628 History   First MD Initiated Contact with Patient 03/06/15 [phone]     Chief Complaint  Patient presents with  . Shortness of Breath     (Consider location/radiation/quality/duration/timing/severity/associated sxs/prior Treatment) Patient is a 59 y.o. male presenting with shortness of breath. The history is provided by the patient.  Shortness of Breath Associated symptoms: chest pain and cough   Associated symptoms: no abdominal pain and no fever    patient presents with right-sided lower chest pain. States it began today but also states he had seen for a week ago. States both this is the same pain it was a week ago and this a brand-new pain. States he has worsening shortness of breath. He's had his left lung removed in the past. States the pain is constant. Not changed with eating. States he is having much more shortness of breath. States he hopes he is treated better here than he was a [place]. No nausea vomiting. No fevers. He has a cough, but that is unchanged.  Past Medical History  Diagnosis Date  . COPD (chronic obstructive pulmonary disease)   . CHF (congestive heart failure)   . lung ca dx'd 2005    chemo/xrt comp 2005, lung ca  . Coronary artery disease   . Myocardial infarction   . Hypertension   . Shortness of breath    Past Surgical History  Procedure Laterality Date  . Coronary angioplasty with stent placement    . Pneumonectomy  2005    left  . Left heart catheterization with coronary angiogram N/A 04/07/2012    Procedure: LEFT HEART CATHETERIZATION WITH CORONARY ANGIOGRAM;  Surgeon: [name], MD;  Location: [facility] CATH LAB;  Service: Cardiovascular;  Laterality: N/A;   Family History  Problem Relation Age of Onset  . Hypertension Other   . Diabetes Other    History  Substance Use Topics  . Smoking status: Former Smoker    Types: Cigarettes    Quit date: 09/30/2013  . Smokeless tobacco: Never Used   . Alcohol Use: No     Comment: former    Review of Systems  Constitutional: Negative for fever and chills.  Respiratory: Positive for cough and shortness of breath.   Cardiovascular: Positive for chest pain.  Gastrointestinal: Negative for abdominal pain.  Genitourinary: Negative for flank pain.  Skin: Negative for wound.  Neurological: Negative for weakness and numbness.      Allergies  Penicillins  Home Medications   Prior to Admission medications   Medication Sig Start Date End Date Taking? Authorizing Provider  albuterol (PROVENTIL HFA;VENTOLIN HFA) 108 (90 BASE) MCG/ACT inhaler Inhale 2 puffs into the lungs every 4 (four) hours as needed for wheezing or shortness of breath. 10/14/13  Yes Kristen N Ward, DO  aspirin EC 81 MG EC tablet Take 1 tablet (81 mg total) by mouth daily. 12/06/12  Yes [name], MD  atorvastatin (LIPITOR) 80 MG tablet Take 1 tablet (80 mg total) by mouth daily at 6 PM. 11/01/13  Yes [name], MD  carvedilol (COREG) 3.125 MG tablet Take 3.125 mg by mouth 2 (two) times daily with a meal.   Yes Historical Provider, MD  ibuprofen (ADVIL,MOTRIN) 800 MG tablet Take 800 mg by mouth every 8 (eight) hours as needed for mild pain.   Yes Historical Provider, MD  montelukast (SINGULAIR) 10 MG tablet Take 10 mg by mouth at bedtime.   Yes Historical Provider,  MD  nitroGLYCERIN (NITROSTAT) 0.4 MG SL tablet Place 1 tablet (0.4 mg total) under the tongue every 5 (five) minutes x 3 doses as needed for chest pain. 11/01/13  Yes [name], MD  prasugrel (EFFIENT) 10 MG TABS Take 10 mg by mouth daily. 04/08/12  Yes Nishant Dhungel, MD  ramipril (ALTACE) 1.25 MG capsule Take 1 capsule (1.25 mg total) by mouth daily. 11/01/13 03/06/15 Yes [name], MD  spironolactone (ALDACTONE) 25 MG tablet Take 25 mg by mouth daily. 02/12/15  Yes Historical Provider, MD  zolpidem (AMBIEN) 10 MG tablet Take 10 mg by mouth at bedtime. 02/16/15  Yes Historical Provider, MD   furosemide (LASIX) 40 MG tablet Take 1 tablet (40 mg total) by mouth 2 (two) times daily. Twice a day for 3 days, then back to normal dosing. 03/06/15   Davonna Belling, MD  levofloxacin (LEVAQUIN) 750 MG tablet Take 1 tablet (750 mg total) by mouth daily. Patient not taking: Reported on 02/26/2015 11/01/13   [name], MD   BP 98/70 mmHg  Pulse 88  Temp(Src) 97.5 F (36.4 C) (Oral)  Resp 18  SpO2 99% Physical Exam  Constitutional: He appears well-developed and well-nourished.  HENT:  Head: Normocephalic.  Neck: Neck supple.  Cardiovascular: Normal rate.   Pulmonary/Chest:  Few scattered rales on right. No breath sounds on left. No chest wall tenderness.  Abdominal: There is tenderness. There is no guarding.  Right upper quadrant tenderness without mass.  Musculoskeletal: He exhibits no edema.  Neurological: He is alert.  Skin: Skin is warm.    ED Course  Procedures (including critical care time) Labs Review Labs Reviewed  BRAIN NATRIURETIC PEPTIDE - Abnormal; Notable for the following:    B Natriuretic Peptide 362.4 (*)    All other components within normal limits  BASIC METABOLIC PANEL  CBC  HEPATIC FUNCTION PANEL  LIPASE, BLOOD  I-STAT TROPOININ, ED    Imaging Review No results found.   EKG Interpretation   Date/Time:  Thursday March 06 2015 08:32:04 EDT Ventricular Rate:  89 PR Interval:  168 QRS Duration: 132 QT Interval:  407 QTC Calculation: 495 R Axis:   -114 Text Interpretation:  Sinus rhythm Nonspecific IVCD with LAD Consider left  ventricular hypertrophy Baseline wander in lead(s) II III aVF No  significant change since last tracing Confirmed by Lillyian Heidt  MD, Ovid Curd  [phone]) on 03/06/2015 8:57:00 AM      MDM   Final diagnoses:  Congestive heart failure, unspecified congestive heart failure chronicity, unspecified congestive heart failure type  Pain of upper abdomen    Patient with some CHF. Will adjust meds and D/c. No clear cause of  pain    Davonna Belling, MD 03/10/15 [phone]

## 2015-03-06 NOTE — ED Notes (Signed)
Two unsuccessful IV start attempts by this RN; both site infiltrated with insertion.

## 2015-03-06 NOTE — Discharge Instructions (Signed)
Heart Failure °Heart failure is a condition in which the heart has trouble pumping blood. This means your heart does not pump blood efficiently for your body to work well. In some cases of heart failure, fluid may back up into your lungs or you may have swelling (edema) in your lower legs. Heart failure is usually a long-term (chronic) condition. It is important for you to take good care of yourself and follow your health care provider's treatment plan. °CAUSES  °Some health conditions can cause heart failure. Those health conditions include: °· High blood pressure (hypertension). Hypertension causes the heart muscle to work harder than normal. When pressure in the blood vessels is high, the heart needs to pump (contract) with more force in order to circulate blood throughout the body. High blood pressure eventually causes the heart to become stiff and weak. °· Coronary artery disease (CAD). CAD is the buildup of cholesterol and fat (plaque) in the arteries of the heart. The blockage in the arteries deprives the heart muscle of oxygen and blood. This can cause chest pain and may lead to a heart attack. High blood pressure can also contribute to CAD. °· Heart attack (myocardial infarction). A heart attack occurs when one or more arteries in the heart become blocked. The loss of oxygen damages the muscle tissue of the heart. When this happens, part of the heart muscle dies. The injured tissue does not contract as well and weakens the heart's ability to pump blood. °· Abnormal heart valves. When the heart valves do not open and close properly, it can cause heart failure. This makes the heart muscle pump harder to keep the blood flowing. °· Heart muscle disease (cardiomyopathy or myocarditis). Heart muscle disease is damage to the heart muscle from a variety of causes. These can include drug or alcohol abuse, infections, or unknown reasons. These can increase the risk of heart failure. °· Lung disease. Lung disease  makes the heart work harder because the lungs do not work properly. This can cause a strain on the heart, leading it to fail. °· Diabetes. Diabetes increases the risk of heart failure. High blood sugar contributes to high fat (lipid) levels in the blood. Diabetes can also cause slow damage to tiny blood vessels that carry important nutrients to the heart muscle. When the heart does not get enough oxygen and food, it can cause the heart to become weak and stiff. This leads to a heart that does not contract efficiently. °· Other conditions can contribute to heart failure. These include abnormal heart rhythms, thyroid problems, and low blood counts (anemia). °Certain unhealthy behaviors can increase the risk of heart failure, including: °· Being overweight. °· Smoking or chewing tobacco. °· Eating foods high in fat and cholesterol. °· Abusing illicit drugs or alcohol. °· Lacking physical activity. °SYMPTOMS  °Heart failure symptoms may vary and can be hard to detect. Symptoms may include: °· Shortness of breath with activity, such as climbing stairs. °· Persistent cough. °· Swelling of the feet, ankles, legs, or abdomen. °· Unexplained weight gain. °· Difficulty breathing when lying flat (orthopnea). °· Waking from sleep because of the need to sit up and get more air. °· Rapid heartbeat. °· Fatigue and loss of energy. °· Feeling light-headed, dizzy, or close to fainting. °· Loss of appetite. °· Nausea. °· Increased urination during the night (nocturia). °DIAGNOSIS  °A diagnosis of heart failure is based on your history, symptoms, physical examination, and diagnostic tests. Diagnostic tests for heart failure may include: °·   Echocardiography. °· Electrocardiography. °· Chest X-ray. °· Blood tests. °· Exercise stress test. °· Cardiac angiography. °· Radionuclide scans. °TREATMENT  °Treatment is aimed at managing the symptoms of heart failure. Medicines, behavioral changes, or surgical intervention may be necessary to  treat heart failure. °· Medicines to help treat heart failure may include: °¨ Angiotensin-converting enzyme (ACE) inhibitors. This type of medicine blocks the effects of a blood protein called angiotensin-converting enzyme. ACE inhibitors relax (dilate) the blood vessels and help lower blood pressure. °¨ Angiotensin receptor blockers (ARBs). This type of medicine blocks the actions of a blood protein called angiotensin. Angiotensin receptor blockers dilate the blood vessels and help lower blood pressure. °¨ Water pills (diuretics). Diuretics cause the kidneys to remove salt and water from the blood. The extra fluid is removed through urination. This loss of extra fluid lowers the volume of blood the heart pumps. °¨ Beta blockers. These prevent the heart from beating too fast and improve heart muscle strength. °¨ Digitalis. This increases the force of the heartbeat. °· Healthy behavior changes include: °¨ Obtaining and maintaining a healthy weight. °¨ Stopping smoking or chewing tobacco. °¨ Eating heart-healthy foods. °¨ Limiting or avoiding alcohol. °¨ Stopping illicit drug use. °¨ Physical activity as directed by your health care provider. °· Surgical treatment for heart failure may include: °¨ A procedure to open blocked arteries, repair damaged heart valves, or remove damaged heart muscle tissue. °¨ A pacemaker to improve heart muscle function and control certain abnormal heart rhythms. °¨ An internal cardioverter defibrillator to treat certain serious abnormal heart rhythms. °¨ A left ventricular assist device (LVAD) to assist the pumping ability of the heart. °HOME CARE INSTRUCTIONS  °· Take medicines only as directed by your health care provider. Medicines are important in reducing the workload of your heart, slowing the progression of heart failure, and improving your symptoms. °¨ Do not stop taking your medicine unless directed by your health care provider. °¨ Do not skip any dose of medicine. °¨ Refill your  prescriptions before you run out of medicine. Your medicines are needed every day. °· Engage in moderate physical activity if directed by your health care provider. Moderate physical activity can benefit some people. The elderly and people with severe heart failure should consult with a health care provider for physical activity recommendations. °· Eat heart-healthy foods. Food choices should be free of trans fat and low in saturated fat, cholesterol, and salt (sodium). Healthy choices include fresh or frozen fruits and vegetables, fish, lean meats, legumes, fat-free or low-fat dairy products, and whole grain or high fiber foods. Talk to a dietitian to learn more about heart-healthy foods. °· Limit sodium if directed by your health care provider. Sodium restriction may reduce symptoms of heart failure in some people. Talk to a dietitian to learn more about heart-healthy seasonings. °· Use healthy cooking methods. Healthy cooking methods include roasting, grilling, broiling, baking, poaching, steaming, or stir-frying. Talk to a dietitian to learn more about healthy cooking methods. °· Limit fluids if directed by your health care provider. Fluid restriction may reduce symptoms of heart failure in some people. °· Weigh yourself every day. Daily weights are important in the early recognition of excess fluid. You should weigh yourself every morning after you urinate and before you eat breakfast. Wear the same amount of clothing each time you weigh yourself. Record your daily weight. Provide your health care provider with your weight record. °· Monitor and record your blood pressure if directed by your health care   provider.  Check your pulse if directed by your health care provider.  Lose weight if directed by your health care provider. Weight loss may reduce symptoms of heart failure in some people.  Stop smoking or chewing tobacco. Nicotine makes your heart work harder by causing your blood vessels to constrict.  Do not use nicotine gum or patches before talking to your health care provider.  Keep all follow-up visits as directed by your health care provider. This is important.  Limit alcohol intake to no more than 1 drink per day for nonpregnant women and 2 drinks per day for men. One drink equals 12 ounces of beer, 5 ounces of wine, or 1 ounces of hard liquor. Drinking more than that is harmful to your heart. Tell your health care provider if you drink alcohol several times a week. Talk with your health care provider about whether alcohol is safe for you. If your heart has already been damaged by alcohol or you have severe heart failure, drinking alcohol should be stopped completely.  Stop illicit drug use.  Stay up-to-date with immunizations. It is especially important to prevent respiratory infections through current pneumococcal and influenza immunizations.  Manage other health conditions such as hypertension, diabetes, thyroid disease, or abnormal heart rhythms as directed by your health care provider.  Learn to manage stress.  Plan rest periods when fatigued.  Learn strategies to manage high temperatures. If the weather is extremely hot:  Avoid vigorous physical activity.  Use air conditioning or fans or seek a cooler location.  Avoid caffeine and alcohol.  Wear loose-fitting, lightweight, and light-colored clothing.  Learn strategies to manage cold temperatures. If the weather is extremely cold:  Avoid vigorous physical activity.  Layer clothes.  Wear mittens or gloves, a hat, and a scarf when going outside.  Avoid alcohol.  Obtain ongoing education and support as needed.  Participate in or seek rehabilitation as needed to maintain or improve independence and quality of life. SEEK MEDICAL CARE IF:   Your weight increases by 03 lb/1.4 kg in 1 day or 05 lb/2.3 kg in a week.  You have increasing shortness of breath that is unusual for you.  You are unable to participate in  your usual physical activities.  You tire easily.  You cough more than normal, especially with physical activity.  You have any or more swelling in areas such as your hands, feet, ankles, or abdomen.  You are unable to sleep because it is hard to breathe.  You feel like your heart is beating fast (palpitations).  You become dizzy or light-headed upon standing up. SEEK IMMEDIATE MEDICAL CARE IF:   You have difficulty breathing.  There is a change in mental status such as decreased alertness or difficulty with concentration.  You have a pain or discomfort in your chest.  You have an episode of fainting (syncope). MAKE SURE YOU:   Understand these instructions.  Will watch your condition.  Will get help right away if you are not doing well or get worse. Document Released: 11/01/2005 Document Revised: 03/18/2014 Document Reviewed: 12/01/2012 [facility] Patient Information 2015 [place], [place]. This information is not intended to replace advice given to you by your health care provider. Make sure you discuss any questions you have with your health care provider.  Abdominal Pain Many things can cause abdominal pain. Usually, abdominal pain is not caused by a disease and will improve without treatment. It can often be observed and treated at home. Your health care provider will  do a physical exam and possibly order blood tests and X-rays to help determine the seriousness of your pain. However, in many cases, more time must pass before a clear cause of the pain can be found. Before that point, your health care provider may not know if you need more testing or further treatment. HOME CARE INSTRUCTIONS  Monitor your abdominal pain for any changes. The following actions may help to alleviate any discomfort you are experiencing:  Only take over-the-counter or prescription medicines as directed by your health care provider.  Do not take laxatives unless directed to do so by your health care  provider.  Try a clear liquid diet (broth, tea, or water) as directed by your health care provider. Slowly move to a bland diet as tolerated. SEEK MEDICAL CARE IF:  You have unexplained abdominal pain.  You have abdominal pain associated with nausea or diarrhea.  You have pain when you urinate or have a bowel movement.  You experience abdominal pain that wakes you in the night.  You have abdominal pain that is worsened or improved by eating food.  You have abdominal pain that is worsened with eating fatty foods.  You have a fever. SEEK IMMEDIATE MEDICAL CARE IF:   Your pain does not go away within 2 hours.  You keep throwing up (vomiting).  Your pain is felt only in portions of the abdomen, such as the right side or the left lower portion of the abdomen.  You pass bloody or black tarry stools. MAKE SURE YOU:  Understand these instructions.   Will watch your condition.   Will get help right away if you are not doing well or get worse.  Document Released: 08/11/2005 Document Revised: 11/06/2013 Document Reviewed: 07/11/2013 [facility] Patient Information 2015 [place], [place]. This information is not intended to replace advice given to you by your health care provider. Make sure you discuss any questions you have with your health care provider.

## 2015-04-29 ENCOUNTER — Encounter (HOSPITAL_COMMUNITY): Payer: Self-pay | Admitting: *Deleted

## 2015-04-29 ENCOUNTER — Emergency Department (HOSPITAL_COMMUNITY): Payer: Medicare Other

## 2015-04-29 ENCOUNTER — Emergency Department (HOSPITAL_COMMUNITY)
Admission: EM | Admit: 2015-04-29 | Discharge: 2015-04-29 | Disposition: A | Discharge status: Home / Self Care | Payer: Medicare Other | Attending: Emergency Medicine | Admitting: Emergency Medicine

## 2015-04-29 DIAGNOSIS — Z880 Allergy status to penicillin: Secondary | ICD-10-CM | POA: Diagnosis not present

## 2015-04-29 DIAGNOSIS — Z87891 Personal history of nicotine dependence: Secondary | ICD-10-CM | POA: Insufficient documentation

## 2015-04-29 DIAGNOSIS — J449 Chronic obstructive pulmonary disease, unspecified: Secondary | ICD-10-CM | POA: Diagnosis not present

## 2015-04-29 DIAGNOSIS — I252 Old myocardial infarction: Secondary | ICD-10-CM | POA: Insufficient documentation

## 2015-04-29 DIAGNOSIS — Z79899 Other long term (current) drug therapy: Secondary | ICD-10-CM | POA: Diagnosis not present

## 2015-04-29 DIAGNOSIS — I509 Heart failure, unspecified: Secondary | ICD-10-CM | POA: Insufficient documentation

## 2015-04-29 DIAGNOSIS — I10 Essential (primary) hypertension: Secondary | ICD-10-CM | POA: Insufficient documentation

## 2015-04-29 DIAGNOSIS — R1084 Generalized abdominal pain: Secondary | ICD-10-CM | POA: Diagnosis present

## 2015-04-29 DIAGNOSIS — Z7982 Long term (current) use of aspirin: Secondary | ICD-10-CM | POA: Diagnosis not present

## 2015-04-29 DIAGNOSIS — Z9889 Other specified postprocedural states: Secondary | ICD-10-CM | POA: Diagnosis not present

## 2015-04-29 DIAGNOSIS — Z85118 Personal history of other malignant neoplasm of bronchus and lung: Secondary | ICD-10-CM | POA: Diagnosis not present

## 2015-04-29 DIAGNOSIS — Z7902 Long term (current) use of antithrombotics/antiplatelets: Secondary | ICD-10-CM | POA: Diagnosis not present

## 2015-04-29 DIAGNOSIS — I2510 Atherosclerotic heart disease of native coronary artery without angina pectoris: Secondary | ICD-10-CM | POA: Diagnosis not present

## 2015-04-29 DIAGNOSIS — R109 Unspecified abdominal pain: Secondary | ICD-10-CM

## 2015-04-29 DIAGNOSIS — Z9861 Coronary angioplasty status: Secondary | ICD-10-CM | POA: Diagnosis not present

## 2015-04-29 LAB — URINALYSIS, ROUTINE W REFLEX MICROSCOPIC
GLUCOSE, UA: NEGATIVE mg/dL
HGB URINE DIPSTICK: NEGATIVE
KETONES UR: 15 mg/dL — AB
Leukocytes, UA: NEGATIVE
Nitrite: NEGATIVE
PH: 5 (ref 5.0–8.0)
Protein, ur: NEGATIVE mg/dL
Specific Gravity, Urine: 1.026 (ref 1.005–1.030)
Urobilinogen, UA: 1 mg/dL (ref 0.0–1.0)

## 2015-04-29 LAB — CBC WITH DIFFERENTIAL/PLATELET
BASOS PCT: 0 % (ref 0–1)
Basophils Absolute: 0 10*3/uL (ref 0.0–0.1)
EOS ABS: 0.1 10*3/uL (ref 0.0–0.7)
EOS PCT: 1 % (ref 0–5)
HCT: 38 % — ABNORMAL LOW (ref 39.0–52.0)
Hemoglobin: 13.5 g/dL (ref 13.0–17.0)
LYMPHS PCT: 15 % (ref 12–46)
Lymphs Abs: 1.2 10*3/uL (ref 0.7–4.0)
MCH: 30 pg (ref 26.0–34.0)
MCHC: 35.5 g/dL (ref 30.0–36.0)
MCV: 84.4 fL (ref 78.0–100.0)
Monocytes Absolute: 0.6 10*3/uL (ref 0.1–1.0)
Monocytes Relative: 8 % (ref 3–12)
NEUTROS PCT: 76 % (ref 43–77)
Neutro Abs: 5.8 10*3/uL (ref 1.7–7.7)
Platelets: 265 10*3/uL (ref 150–400)
RBC: 4.5 MIL/uL (ref 4.22–5.81)
RDW: 15.2 % (ref 11.5–15.5)
WBC: 7.7 10*3/uL (ref 4.0–10.5)

## 2015-04-29 LAB — COMPREHENSIVE METABOLIC PANEL
ALK PHOS: 69 U/L (ref 38–126)
ALT: 13 U/L — AB (ref 17–63)
AST: 20 U/L (ref 15–41)
Albumin: 3.3 g/dL — ABNORMAL LOW (ref 3.5–5.0)
Anion gap: 10 (ref 5–15)
BUN: 14 mg/dL (ref 6–20)
CALCIUM: 8.7 mg/dL — AB (ref 8.9–10.3)
CHLORIDE: 103 mmol/L (ref 101–111)
CO2: 21 mmol/L — AB (ref 22–32)
Creatinine, Ser: 0.94 mg/dL (ref 0.61–1.24)
GFR calc Af Amer: >60 mL/min (ref >60)
GLUCOSE: 121 mg/dL — AB (ref 65–99)
POTASSIUM: 3.6 mmol/L (ref 3.5–5.1)
SODIUM: 134 mmol/L — AB (ref 135–145)
Total Bilirubin: 0.8 mg/dL (ref 0.3–1.2)
Total Protein: 7.1 g/dL (ref 6.5–8.1)

## 2015-04-29 LAB — LIPASE, BLOOD: Lipase: 15 U/L — ABNORMAL LOW (ref 22–51)

## 2015-04-29 NOTE — ED Provider Notes (Signed)
Medical screening examination/treatment/procedure(s) were performed by non-physician practitioner and as supervising physician I was immediately available for consultation/collaboration.   EKG Interpretation   Date/Time:  Tuesday April 29 2015 04:32:10 EDT Ventricular Rate:  84 PR Interval:  172 QRS Duration: 133 QT Interval:  430 QTC Calculation: 508 R Axis:   -57 Text Interpretation:  Sinus rhythm Probable left atrial enlargement  Nonspecific IVCD with LAD LVH with secondary repolarization abnormality  Confirmed by [facility]  MD, Yobany Vroom ([id number]) on 04/29/2015 5:52:43 AM       Manly Nestle, MD 04/29/15 2306

## 2015-04-29 NOTE — ED Provider Notes (Signed)
CSN: [id number]     Arrival date & time 04/29/15  0426 History   First MD Initiated Contact with Patient 04/29/15 [phone]     Chief Complaint  Patient presents with  . Abdominal Pain    HPI   59 year old male presents with complaints of stabbing abdominal pain per EMS. At time of my evaluation patient was nonfocal, described the abdominal pain is diffuse. Reports that recently his bowel movements have been firm, and infrequent. Patient reports that still passing gas at this time. She reports that he has not been eating or drinking much, low fiber diet, low water intake. Patient denies headache, nausea, vomiting, chest pain, changes in his urinary habits characteristics or frequency. Patient denies fever, chills. No history of abdominal surgeries. Patient denies recent alcohol use. Is not tried any medications for this.   Past Medical History  Diagnosis Date  . COPD (chronic obstructive pulmonary disease)   . CHF (congestive heart failure)   . lung ca dx'd 2005    chemo/xrt comp 2005, lung ca  . Coronary artery disease   . Myocardial infarction   . Hypertension   . Shortness of breath    Past Surgical History  Procedure Laterality Date  . Coronary angioplasty with stent placement    . Pneumonectomy  2005    left  . Left heart catheterization with coronary angiogram N/A 04/07/2012    Procedure: LEFT HEART CATHETERIZATION WITH CORONARY ANGIOGRAM;  Surgeon: [name], MD;  [facility] CATH LAB;  Service: Cardiovascular;  Laterality: N/A;   Family History  Problem Relation Age of Onset  . Hypertension Other   . Diabetes Other    History  Substance Use Topics  . Smoking status: Former Smoker    Types: Cigarettes    Quit date: 09/30/2013  . Smokeless tobacco: Never Used  . Alcohol Use: No     Comment: former    Review of Systems  All other systems reviewed and are negative.   Allergies  Penicillins  Home Medications   Prior to Admission medications   Medication Sig  Start Date End Date Taking? Authorizing Provider  albuterol (PROVENTIL HFA;VENTOLIN HFA) 108 (90 BASE) MCG/ACT inhaler Inhale 2 puffs into the lungs every 4 (four) hours as needed for wheezing or shortness of breath. 10/14/13  Yes Kristen N Ward, DO  aspirin EC 81 MG EC tablet Take 1 tablet (81 mg total) by mouth daily. 12/06/12  Yes [name], MD  atorvastatin (LIPITOR) 80 MG tablet Take 1 tablet (80 mg total) by mouth daily at 6 PM. 11/01/13  Yes [name], MD  carvedilol (COREG) 3.125 MG tablet Take 3.125 mg by mouth 2 (two) times daily with a meal.   Yes Historical Provider, MD  furosemide (LASIX) 40 MG tablet Take 1 tablet (40 mg total) by mouth 2 (two) times daily. Twice a day for 3 days, then back to normal dosing. Patient taking differently: Take 40 mg by mouth daily.  03/06/15  Yes Davonna Belling, MD  ibuprofen (ADVIL,MOTRIN) 800 MG tablet Take 800 mg by mouth every 8 (eight) hours as needed for mild pain.   Yes Historical Provider, MD  montelukast (SINGULAIR) 10 MG tablet Take 10 mg by mouth at bedtime.   Yes Historical Provider, MD  nitroGLYCERIN (NITROSTAT) 0.4 MG SL tablet Place 1 tablet (0.4 mg total) under the tongue every 5 (five) minutes x 3 doses as needed for chest pain. 11/01/13  Yes [name], MD  prasugrel (EFFIENT) 10 MG TABS  Take 10 mg by mouth daily. 04/08/12  Yes Nishant Dhungel, MD  spironolactone (ALDACTONE) 25 MG tablet Take 25 mg by mouth daily. 02/12/15  Yes Historical Provider, MD  zolpidem (AMBIEN) 10 MG tablet Take 10 mg by mouth at bedtime. 02/16/15  Yes Historical Provider, MD  ramipril (ALTACE) 1.25 MG capsule Take 1 capsule (1.25 mg total) by mouth daily. 11/01/13 03/06/15  [name], MD   BP 100/68 mmHg  Pulse 78  Temp(Src) 98.1 F (36.7 C) (Oral)  Resp 22  SpO2 96% Physical Exam  Constitutional: He is oriented to person, place, and time. He appears well-developed and well-nourished.  HENT:  Head: Normocephalic and atraumatic.  Eyes:  Conjunctivae are normal. Pupils are equal, round, and reactive to light. Right eye exhibits no discharge. Left eye exhibits no discharge. No scleral icterus.  Neck: Normal range of motion. No JVD present. No tracheal deviation present.  Pulmonary/Chest: Effort normal. No stridor.  Abdominal: Soft. Bowel sounds are normal. He exhibits no distension and no mass. There is no tenderness. There is no rebound and no guarding.  Musculoskeletal: Normal range of motion.  No swelling or edema of the distal extremities distal pulses intact  Neurological: He is alert and oriented to person, place, and time. Coordination normal.  Psychiatric: He has a normal mood and affect. His behavior is normal. Judgment and thought content normal.  Nursing note and vitals reviewed.   ED Course  Procedures (including critical care time) Labs Review Labs Reviewed  CBC WITH DIFFERENTIAL/PLATELET - Abnormal; Notable for the following:    HCT 38.0 (*)    All other components within normal limits  COMPREHENSIVE METABOLIC PANEL - Abnormal; Notable for the following:    Sodium 134 (*)    CO2 21 (*)    Glucose, Bld 121 (*)    Calcium 8.7 (*)    Albumin 3.3 (*)    ALT 13 (*)    All other components within normal limits  LIPASE, BLOOD - Abnormal; Notable for the following:    Lipase 15 (*)    All other components within normal limits  URINALYSIS, ROUTINE W REFLEX MICROSCOPIC (NOT AT [facility])    Imaging Review No results found.   EKG Interpretation   Date/Time:  Tuesday April 29 2015 04:32:10 EDT Ventricular Rate:  84 PR Interval:  172 QRS Duration: 133 QT Interval:  430 QTC Calculation: 508 R Axis:   -57 Text Interpretation:  Sinus rhythm Probable left atrial enlargement  Nonspecific IVCD with LAD LVH with secondary repolarization abnormality  Confirmed by [facility]  MD, APRIL ([id number]) on 04/29/2015 5:52:43 AM      MDM   Final diagnoses:  Abdominal pain, acute    Labs: CBC, CMP, lipase, urinalysis-  no significant findings  Imaging: DG abdomen acute with chest- no significant or contributory findings  Consults: none  Therapeutics: none  Assessment: Abdominal pain  Plan: Patient presents with abdominal pain. Doesn't patient likely represents constipation, reports he is still passing stools. Patient nonfocal on my exam today reports symptoms are improving, unlikely to be bowel obstruction. Patient afebrile no signs of infection. No other significant findings that would indicate further management in ED setting. Patient is advised to follow-up with primary care provider in 3 days for further evaluation and management, strict return precautions given any new or worsening signs or symptoms present. Verbalize understanding and agreement to today's plan with no further questions concerns at time of discharge.      Okey Regal, PA-C 05/05/15 [phone]  Veatrice Kells, MD 05/05/15 2324

## 2015-04-29 NOTE — Discharge Instructions (Signed)
Abdominal Pain Many things can cause abdominal pain. Usually, abdominal pain is not caused by a disease and will improve without treatment. It can often be observed and treated at home. Your health care provider will do a physical exam and possibly order blood tests and X-rays to help determine the seriousness of your pain. However, in many cases, more time must pass before a clear cause of the pain can be found. Before that point, your health care provider may not know if you need more testing or further treatment. HOME CARE INSTRUCTIONS  Monitor your abdominal pain for any changes. The following actions may help to alleviate any discomfort you are experiencing:  Only take over-the-counter or prescription medicines as directed by your health care provider.  Do not take laxatives unless directed to do so by your health care provider.  Try a clear liquid diet (broth, tea, or water) as directed by your health care provider. Slowly move to a bland diet as tolerated. SEEK MEDICAL CARE IF:  You have unexplained abdominal pain.  You have abdominal pain associated with nausea or diarrhea.  You have pain when you urinate or have a bowel movement.  You experience abdominal pain that wakes you in the night.  You have abdominal pain that is worsened or improved by eating food.  You have abdominal pain that is worsened with eating fatty foods.  You have a fever. SEEK IMMEDIATE MEDICAL CARE IF:   Your pain does not go away within 2 hours.  You keep throwing up (vomiting).  Your pain is felt only in portions of the abdomen, such as the right side or the left lower portion of the abdomen.  You pass bloody or black tarry stools. MAKE SURE YOU:  Understand these instructions.   Will watch your condition.   Will get help right away if you are not doing well or get worse.  Document Released: 08/11/2005 Document Revised: 11/06/2013 Document Reviewed: 07/11/2013 [facility] Patient Information  2015 [place], [place]. This information is not intended to replace advice given to you by your health care provider. Make sure you discuss any questions you have with your health care provider.  Please monitor for new or worsening signs or symptoms, return immediately if any present. His contact her primary care provider schedule follow-up appointment within the next 3 days, follow-up sooner as needed. Drink plenty water, increase fiber in your diet.

## 2015-04-29 NOTE — ED Notes (Signed)
Pt arrived by [facility] for stabbing pain to abd this am. Pain to all 4 quads but more severe in upper abd. Denies n/v/d. Took ibuprofen pta. Denies cp.

## 2015-04-30 ENCOUNTER — Encounter (HOSPITAL_COMMUNITY): Payer: Self-pay | Admitting: *Deleted

## 2015-04-30 ENCOUNTER — Emergency Department (HOSPITAL_COMMUNITY)
Admission: EM | Admit: 2015-04-30 | Discharge: 2015-05-02 | Disposition: A | Discharge status: Home / Self Care | Payer: Medicare Other | Attending: Emergency Medicine | Admitting: Emergency Medicine

## 2015-04-30 DIAGNOSIS — R4182 Altered mental status, unspecified: Secondary | ICD-10-CM | POA: Diagnosis present

## 2015-04-30 DIAGNOSIS — I5022 Chronic systolic (congestive) heart failure: Secondary | ICD-10-CM | POA: Diagnosis not present

## 2015-04-30 DIAGNOSIS — J441 Chronic obstructive pulmonary disease with (acute) exacerbation: Secondary | ICD-10-CM | POA: Diagnosis not present

## 2015-04-30 DIAGNOSIS — R45851 Suicidal ideations: Secondary | ICD-10-CM | POA: Diagnosis not present

## 2015-04-30 DIAGNOSIS — Z87891 Personal history of nicotine dependence: Secondary | ICD-10-CM | POA: Insufficient documentation

## 2015-04-30 DIAGNOSIS — C3490 Malignant neoplasm of unspecified part of unspecified bronchus or lung: Secondary | ICD-10-CM | POA: Diagnosis present

## 2015-04-30 DIAGNOSIS — T426X1A Poisoning by other antiepileptic and sedative-hypnotic drugs, accidental (unintentional), initial encounter: Secondary | ICD-10-CM | POA: Insufficient documentation

## 2015-04-30 DIAGNOSIS — Z7982 Long term (current) use of aspirin: Secondary | ICD-10-CM | POA: Insufficient documentation

## 2015-04-30 DIAGNOSIS — Z7902 Long term (current) use of antithrombotics/antiplatelets: Secondary | ICD-10-CM | POA: Diagnosis not present

## 2015-04-30 DIAGNOSIS — Z9861 Coronary angioplasty status: Secondary | ICD-10-CM | POA: Insufficient documentation

## 2015-04-30 DIAGNOSIS — Z880 Allergy status to penicillin: Secondary | ICD-10-CM | POA: Insufficient documentation

## 2015-04-30 DIAGNOSIS — F1410 Cocaine abuse, uncomplicated: Secondary | ICD-10-CM | POA: Diagnosis present

## 2015-04-30 DIAGNOSIS — I10 Essential (primary) hypertension: Secondary | ICD-10-CM | POA: Insufficient documentation

## 2015-04-30 DIAGNOSIS — T426X2A Poisoning by other antiepileptic and sedative-hypnotic drugs, intentional self-harm, initial encounter: Secondary | ICD-10-CM | POA: Diagnosis not present

## 2015-04-30 DIAGNOSIS — Z79899 Other long term (current) drug therapy: Secondary | ICD-10-CM | POA: Diagnosis not present

## 2015-04-30 DIAGNOSIS — Z720 Tobacco use: Secondary | ICD-10-CM | POA: Diagnosis present

## 2015-04-30 DIAGNOSIS — Y999 Unspecified external cause status: Secondary | ICD-10-CM | POA: Diagnosis not present

## 2015-04-30 DIAGNOSIS — Y929 Unspecified place or not applicable: Secondary | ICD-10-CM | POA: Insufficient documentation

## 2015-04-30 DIAGNOSIS — T1491 Suicide attempt: Secondary | ICD-10-CM | POA: Diagnosis not present

## 2015-04-30 DIAGNOSIS — F329 Major depressive disorder, single episode, unspecified: Secondary | ICD-10-CM | POA: Diagnosis not present

## 2015-04-30 DIAGNOSIS — Y939 Activity, unspecified: Secondary | ICD-10-CM | POA: Insufficient documentation

## 2015-04-30 DIAGNOSIS — I340 Nonrheumatic mitral (valve) insufficiency: Secondary | ICD-10-CM | POA: Diagnosis present

## 2015-04-30 DIAGNOSIS — I252 Old myocardial infarction: Secondary | ICD-10-CM | POA: Diagnosis not present

## 2015-04-30 DIAGNOSIS — T50902A Poisoning by unspecified drugs, medicaments and biological substances, intentional self-harm, initial encounter: Secondary | ICD-10-CM

## 2015-04-30 DIAGNOSIS — I2510 Atherosclerotic heart disease of native coronary artery without angina pectoris: Secondary | ICD-10-CM | POA: Insufficient documentation

## 2015-04-30 DIAGNOSIS — Z85118 Personal history of other malignant neoplasm of bronchus and lung: Secondary | ICD-10-CM | POA: Insufficient documentation

## 2015-04-30 LAB — CBC
HCT: 36.7 % — ABNORMAL LOW (ref 39.0–52.0)
HEMOGLOBIN: 12.7 g/dL — AB (ref 13.0–17.0)
MCH: 30 pg (ref 26.0–34.0)
MCHC: 34.6 g/dL (ref 30.0–36.0)
MCV: 86.6 fL (ref 78.0–100.0)
Platelets: 266 10*3/uL (ref 150–400)
RBC: 4.24 MIL/uL (ref 4.22–5.81)
RDW: 15.5 % (ref 11.5–15.5)
WBC: 4.9 10*3/uL (ref 4.0–10.5)

## 2015-04-30 LAB — COMPREHENSIVE METABOLIC PANEL
ALBUMIN: 3.2 g/dL — AB (ref 3.5–5.0)
ALT: 12 U/L — AB (ref 17–63)
AST: 21 U/L (ref 15–41)
Alkaline Phosphatase: 73 U/L (ref 38–126)
Anion gap: 7 (ref 5–15)
BUN: 12 mg/dL (ref 6–20)
CALCIUM: 8.4 mg/dL — AB (ref 8.9–10.3)
CO2: 23 mmol/L (ref 22–32)
CREATININE: 0.88 mg/dL (ref 0.61–1.24)
Chloride: 109 mmol/L (ref 101–111)
GFR calc Af Amer: >60 mL/min (ref >60)
GFR calc non Af Amer: >60 mL/min (ref >60)
Glucose, Bld: 114 mg/dL — ABNORMAL HIGH (ref 65–99)
Potassium: 3.7 mmol/L (ref 3.5–5.1)
Sodium: 139 mmol/L (ref 135–145)
TOTAL PROTEIN: 6.8 g/dL (ref 6.5–8.1)
Total Bilirubin: 0.5 mg/dL (ref 0.3–1.2)

## 2015-04-30 LAB — ACETAMINOPHEN LEVEL: Acetaminophen (Tylenol), Serum: <10 ug/mL — ABNORMAL LOW (ref 10–30)

## 2015-04-30 LAB — ETHANOL: Alcohol, Ethyl (B): <5 mg/dL (ref <5)

## 2015-04-30 LAB — BLOOD GAS, VENOUS
Acid-base deficit: 1.6 mmol/L (ref 0.0–2.0)
Bicarbonate: 21.9 mEq/L (ref 20.0–24.0)
FIO2: 0.21 %
O2 SAT: 89.4 %
PATIENT TEMPERATURE: 98.6
PCO2 VEN: 35.1 mmHg — AB (ref 45.0–50.0)
PH VEN: 7.413 — AB (ref 7.250–7.300)
TCO2: 19.7 mmol/L (ref 0–100)
pO2, Ven: 59.8 mmHg — ABNORMAL HIGH (ref 30.0–45.0)

## 2015-04-30 LAB — SALICYLATE LEVEL: Salicylate Lvl: <4 mg/dL (ref 2.8–30.0)

## 2015-04-30 MED ORDER — NALOXONE HCL 0.4 MG/ML IJ SOLN
0.4000 mg | Freq: Once | INTRAMUSCULAR | Status: AC
  Administered 2015-04-30: 0.4 mg via INTRAVENOUS
  Filled 2015-04-30: qty 1

## 2015-04-30 NOTE — ED Notes (Signed)
Per [facility] - pt from home, per pt and family pt intentionally took x10 ambien tablets approx 20:00 this evening - pt w/ x2 weeks of SI. Pt w/ hx of terminal lung CA and left lobectomy. Pt responds to verbal stimulus.

## 2015-04-30 NOTE — ED Provider Notes (Signed)
CSN: [id number]     Arrival date & time 04/30/15  2204 History   None    Chief Complaint  Patient presents with  . Drug Overdose     (Consider location/radiation/quality/duration/timing/severity/associated sxs/prior Treatment) HPI Level V caveat altered mental status. I attempted to call patient's home, no answer. History is obtained from EMS. Patient reportedly took 10 Ambien tablets 8 PM today.Marland Kitchen He was found EMS to be somnolent. No treatment prior to coming here. Past Medical History  Diagnosis Date  . COPD (chronic obstructive pulmonary disease)   . CHF (congestive heart failure)   . lung ca dx'd 2005    chemo/xrt comp 2005, lung ca  . Coronary artery disease   . Myocardial infarction   . Hypertension   . Shortness of breath    Past Surgical History  Procedure Laterality Date  . Coronary angioplasty with stent placement    . Pneumonectomy  2005    left  . Left heart catheterization with coronary angiogram N/A 04/07/2012    Procedure: LEFT HEART CATHETERIZATION WITH CORONARY ANGIOGRAM;  Surgeon: [name], MD;  Location: [facility] CATH LAB;  Service: Cardiovascular;  Laterality: N/A;   Family History  Problem Relation Age of Onset  . Hypertension Other   . Diabetes Other    History  Substance Use Topics  . Smoking status: Former Smoker    Types: Cigarettes    Quit date: 09/30/2013  . Smokeless tobacco: Never Used  . Alcohol Use: No     Comment: former    Review of Systems  Unable to perform ROS: Mental status change      Allergies  Penicillins  Home Medications   Prior to Admission medications   Medication Sig Start Date End Date Taking? Authorizing Provider  albuterol (PROVENTIL HFA;VENTOLIN HFA) 108 (90 BASE) MCG/ACT inhaler Inhale 2 puffs into the lungs every 4 (four) hours as needed for wheezing or shortness of breath. 10/14/13   Kristen N Ward, DO  aspirin EC 81 MG EC tablet Take 1 tablet (81 mg total) by mouth daily. 12/06/12   [name], MD   atorvastatin (LIPITOR) 80 MG tablet Take 1 tablet (80 mg total) by mouth daily at 6 PM. 11/01/13   [name], MD  carvedilol (COREG) 3.125 MG tablet Take 3.125 mg by mouth 2 (two) times daily with a meal.    Historical Provider, MD  furosemide (LASIX) 40 MG tablet Take 1 tablet (40 mg total) by mouth 2 (two) times daily. Twice a day for 3 days, then back to normal dosing. Patient taking differently: Take 40 mg by mouth daily.  03/06/15   Davonna Belling, MD  ibuprofen (ADVIL,MOTRIN) 800 MG tablet Take 800 mg by mouth every 8 (eight) hours as needed for mild pain.    Historical Provider, MD  montelukast (SINGULAIR) 10 MG tablet Take 10 mg by mouth at bedtime.    Historical Provider, MD  nitroGLYCERIN (NITROSTAT) 0.4 MG SL tablet Place 1 tablet (0.4 mg total) under the tongue every 5 (five) minutes x 3 doses as needed for chest pain. 11/01/13   [name], MD  prasugrel (EFFIENT) 10 MG TABS Take 10 mg by mouth daily. 04/08/12   Nishant Dhungel, MD  ramipril (ALTACE) 1.25 MG capsule Take 1 capsule (1.25 mg total) by mouth daily. 11/01/13 03/06/15  [name], MD  spironolactone (ALDACTONE) 25 MG tablet Take 25 mg by mouth daily. 02/12/15   Historical Provider, MD  zolpidem (AMBIEN) 10 MG tablet Take 10 mg by  mouth at bedtime. 02/16/15   Historical Provider, MD   BP 99/61 mmHg  Pulse 81  Temp(Src) 98.6 F (37 C) (Oral)  Resp 22  SpO2 99% Physical Exam  Constitutional: He appears well-developed and well-nourished.  Somnolent Glasgow Coma Score 11 ;1 for verbal, squeeze his hand to verbal command. Eyes open.  HENT:  Head: Normocephalic and atraumatic.  Eyes: Conjunctivae are normal. Pupils are equal, round, and reactive to light.  Neck: Neck supple. No tracheal deviation present. No thyromegaly present.  Cardiovascular: Normal rate and regular rhythm.   No murmur heard. Pulmonary/Chest: Effort normal and breath sounds normal.  Abdominal: Soft. Bowel sounds are normal. He exhibits no  distension. There is no tenderness.  Musculoskeletal: Normal range of motion. He exhibits no edema or tenderness.  Neurological: No cranial nerve deficit. Coordination normal.  Somnolent, nonverbal, moves all extremities  Skin: Skin is warm and dry. No rash noted.  Nursing note and vitals reviewed.   ED Course  Procedures (including critical care time) Labs Review Labs Reviewed - No data to display  Imaging Review Dg Abd Acute W/chest  04/29/2015   CLINICAL DATA:  59 year old male with acute upper abdominal pain this morning. Initial encounter. Current history of left pneumonectomy for lung cancer.  EXAM: DG ABDOMEN ACUTE W/ 1V CHEST  COMPARISON:  03/06/2015 chest radiographs and earlier. CT Abdomen and Pelvis 03/18/2011.  FINDINGS: Stable leftward shift of the mediastinum and opacification of the left hemi thorax with underlying surgical clips. A small volume of aeration at the left hilar level is unchanged over this series of exams.  Right lung volume and increased reticulonodular density appears stable. No pneumothorax or pneumoperitoneum.  Bowel gas primarily occupies the central on left abdomen as in 2012. No dilated loops or evidence of bowel obstruction. Abdominal and pelvic visceral contours appear stable. No acute osseous abnormality identified.  IMPRESSION: 1.  Normal bowel gas pattern, no free air. 2. Stable postoperative appearance of the chest status post left pneumonectomy.   Electronically Signed   By: Genevie Ann M.D.   On: 04/29/2015 07:14     EKG Interpretation None      ED ECG REPORT   Date: 04/30/2015  Rate: 90  Rhythm: normal sinus rhythm  QRS Axis: left  Intervals: normal  ST/T Wave abnormalities: nonspecific T wave changes  Conduction Disutrbances:nonspecific intraventricular conduction delay  Narrative Interpretation:   Old EKG Reviewed: unchanged  I have personally reviewed the EKG tracing and agree with the computerized printout as noted. Poison control called.  Suggest surge for coingestants.  If Ambien is only ingestant, observed for 6 hours and watch for level of alertness. If there are coingestants present watch for respiratory depression  22:29 PM received Narcan 0.4 mg IV, without improvement of mental status. At 12:40 AM I spoke with patient's caregiverTameka Lennox Grumbles who stated that at 8 PM the patient had text hurts stating that he had taken several Ambien tablets and she arrived at his home at 9 PM and he took 2 additional Ambien tablets in her presence.she reporrts that he had spoken of suicide several times this past week 12:40 PM patient is more awake. He is asking for food. Results for orders placed or performed during the hospital encounter of 04/30/15  CBC  Result Value Ref Range   WBC 4.9 4.0 - 10.5 K/uL   RBC 4.24 4.22 - 5.81 MIL/uL   Hemoglobin 12.7 (L) 13.0 - 17.0 g/dL   HCT 36.7 (L) 39.0 - 52.0 %  MCV 86.6 78.0 - 100.0 fL   MCH 30.0 26.0 - 34.0 pg   MCHC 34.6 30.0 - 36.0 g/dL   RDW 15.5 11.5 - 15.5 %   Platelets 266 150 - 400 K/uL  Comprehensive metabolic panel  Result Value Ref Range   Sodium 139 135 - 145 mmol/L   Potassium 3.7 3.5 - 5.1 mmol/L   Chloride 109 101 - 111 mmol/L   CO2 23 22 - 32 mmol/L   Glucose, Bld 114 (H) 65 - 99 mg/dL   BUN 12 6 - 20 mg/dL   Creatinine, Ser 0.88 0.61 - 1.24 mg/dL   Calcium 8.4 (L) 8.9 - 10.3 mg/dL   Total Protein 6.8 6.5 - 8.1 g/dL   Albumin 3.2 (L) 3.5 - 5.0 g/dL   AST 21 15 - 41 U/L   ALT 12 (L) 17 - 63 U/L   Alkaline Phosphatase 73 38 - 126 U/L   Total Bilirubin 0.5 0.3 - 1.2 mg/dL   GFR calc non Af Amer >60 >60 mL/min   GFR calc Af Amer >60 >60 mL/min   Anion gap 7 5 - 15  Ethanol (ETOH)  Result Value Ref Range   Alcohol, Ethyl (B) <5 <5 mg/dL  Acetaminophen level  Result Value Ref Range   Acetaminophen (Tylenol), Serum <10 (L) 10 - 30 ug/mL  Salicylate level  Result Value Ref Range   Salicylate Lvl <0.3 2.8 - 30.0 mg/dL  Blood gas, venous  Result Value Ref Range    FIO2 0.21 %   pH, Ven 7.413 (H) 7.250 - 7.300   pCO2, Ven 35.1 (L) 45.0 - 50.0 mmHg   pO2, Ven 59.8 (H) 30.0 - 45.0 mmHg   Bicarbonate 21.9 20.0 - 24.0 mEq/L   TCO2 19.7 0 - 100 mmol/L   Acid-base deficit 1.6 0.0 - 2.0 mmol/L   O2 Saturation 89.4 %   Patient temperature 98.6    Collection site VEIN    Drawn by COLLECTED BY LABORATORY    Sample type VEIN    Dg Abd Acute W/chest  04/29/2015   CLINICAL DATA:  59 year old male with acute upper abdominal pain this morning. Initial encounter. Current history of left pneumonectomy for lung cancer.  EXAM: DG ABDOMEN ACUTE W/ 1V CHEST  COMPARISON:  03/06/2015 chest radiographs and earlier. CT Abdomen and Pelvis 03/18/2011.  FINDINGS: Stable leftward shift of the mediastinum and opacification of the left hemi thorax with underlying surgical clips. A small volume of aeration at the left hilar level is unchanged over this series of exams.  Right lung volume and increased reticulonodular density appears stable. No pneumothorax or pneumoperitoneum.  Bowel gas primarily occupies the central on left abdomen as in 2012. No dilated loops or evidence of bowel obstruction. Abdominal and pelvic visceral contours appear stable. No acute osseous abnormality identified.  IMPRESSION: 1.  Normal bowel gas pattern, no free air. 2. Stable postoperative appearance of the chest status post left pneumonectomy.   Electronically Signed   By: Genevie Ann M.D.   On: 04/29/2015 07:14   Pt signed out to Dr. Claudine Mouton at 1250 pm MDM  Pt should be observed in ED until 3 am until considered to be cleared for psychiatric evaluation Final diagnoses:  None  Dx intentional drug overdose  CRITICAL CARE Performed by: Orlie Dakin Total critical care time: 30 minute Critical care time was exclusive of separately billable procedures and treating other patients. Critical care was necessary to treat or prevent imminent or life-threatening deterioration. Critical care  was time spent personally by  me on the following activities: development of treatment plan with patient and/or surrogate as well as nursing, discussions with consultants, evaluation of patient's response to treatment, examination of patient, obtaining history from patient or surrogate, ordering and performing treatments and interventions, ordering and review of laboratory studies, ordering and review of radiographic studies, pulse oximetry and re-evaluation of patient's condition.    Orlie Dakin, MD 05/01/15 0100

## 2015-04-30 NOTE — ED Notes (Signed)
Bed: JQ96 Expected date:  Expected time:  Means of arrival:  Comments: EMS took 10 ambien/terminal cancer

## 2015-05-01 ENCOUNTER — Emergency Department (HOSPITAL_COMMUNITY): Payer: Medicare Other

## 2015-05-01 DIAGNOSIS — T426X1A Poisoning by other antiepileptic and sedative-hypnotic drugs, accidental (unintentional), initial encounter: Secondary | ICD-10-CM | POA: Diagnosis not present

## 2015-05-01 DIAGNOSIS — F329 Major depressive disorder, single episode, unspecified: Secondary | ICD-10-CM | POA: Diagnosis not present

## 2015-05-01 DIAGNOSIS — R45851 Suicidal ideations: Secondary | ICD-10-CM

## 2015-05-01 DIAGNOSIS — T426X2A Poisoning by other antiepileptic and sedative-hypnotic drugs, intentional self-harm, initial encounter: Secondary | ICD-10-CM

## 2015-05-01 DIAGNOSIS — T50902A Poisoning by unspecified drugs, medicaments and biological substances, intentional self-harm, initial encounter: Secondary | ICD-10-CM | POA: Insufficient documentation

## 2015-05-01 DIAGNOSIS — T1491 Suicide attempt: Secondary | ICD-10-CM

## 2015-05-01 LAB — I-STAT TROPONIN, ED
TROPONIN I, POC: 0.03 ng/mL (ref 0.00–0.08)
Troponin i, poc: 0.02 ng/mL (ref 0.00–0.08)

## 2015-05-01 LAB — RAPID URINE DRUG SCREEN, HOSP PERFORMED
Amphetamines: NOT DETECTED
BENZODIAZEPINES: NOT DETECTED
Barbiturates: NOT DETECTED
Cocaine: NOT DETECTED
Opiates: NOT DETECTED
Tetrahydrocannabinol: NOT DETECTED

## 2015-05-01 LAB — BRAIN NATRIURETIC PEPTIDE: B Natriuretic Peptide: 374.2 pg/mL — ABNORMAL HIGH (ref 0.0–100.0)

## 2015-05-01 MED ORDER — NITROGLYCERIN 0.4 MG SL SUBL
0.4000 mg | SUBLINGUAL_TABLET | SUBLINGUAL | Status: DC | PRN
  Administered 2015-05-01 (×2): 0.4 mg via SUBLINGUAL
  Filled 2015-05-01 (×2): qty 1

## 2015-05-01 MED ORDER — FUROSEMIDE 10 MG/ML IJ SOLN
40.0000 mg | Freq: Once | INTRAMUSCULAR | Status: AC
  Administered 2015-05-01: 40 mg via INTRAVENOUS
  Filled 2015-05-01: qty 4

## 2015-05-01 MED ORDER — ALBUTEROL SULFATE (2.5 MG/3ML) 0.083% IN NEBU: 5.0000 mg | INHALATION_SOLUTION | Freq: Four times a day (QID) | RESPIRATORY_TRACT | Status: DC | PRN

## 2015-05-01 MED ORDER — NICOTINE 21 MG/24HR TD PT24: 21.0000 mg | MEDICATED_PATCH | Freq: Every day | TRANSDERMAL | Status: DC | PRN

## 2015-05-01 MED ORDER — PRASUGREL HCL 10 MG PO TABS
10.0000 mg | ORAL_TABLET | Freq: Every day | ORAL | Status: DC
  Administered 2015-05-01: 10 mg via ORAL
  Filled 2015-05-01 (×2): qty 1

## 2015-05-01 MED ORDER — FUROSEMIDE 40 MG PO TABS: 20.0000 mg | ORAL_TABLET | Freq: Two times a day (BID) | ORAL | Status: DC

## 2015-05-01 MED ORDER — ZOLPIDEM TARTRATE 10 MG PO TABS
10.0000 mg | ORAL_TABLET | Freq: Every day | ORAL | Status: DC
  Administered 2015-05-01: 10 mg via ORAL
  Filled 2015-05-01: qty 1

## 2015-05-01 MED ORDER — ALBUTEROL SULFATE HFA 108 (90 BASE) MCG/ACT IN AERS: 2.0000 | INHALATION_SPRAY | RESPIRATORY_TRACT | Status: DC | PRN

## 2015-05-01 MED ORDER — ALUM & MAG HYDROXIDE-SIMETH 200-200-20 MG/5ML PO SUSP: 30.0000 mL | ORAL | Status: DC | PRN

## 2015-05-01 MED ORDER — MONTELUKAST SODIUM 10 MG PO TABS
10.0000 mg | ORAL_TABLET | Freq: Every day | ORAL | Status: DC
  Administered 2015-05-01: 10 mg via ORAL
  Filled 2015-05-01 (×4): qty 1

## 2015-05-01 MED ORDER — PREDNISONE 20 MG PO TABS
20.0000 mg | ORAL_TABLET | Freq: Two times a day (BID) | ORAL | Status: DC
  Administered 2015-05-01 – 2015-05-02 (×2): 20 mg via ORAL
  Filled 2015-05-01 (×2): qty 1

## 2015-05-01 MED ORDER — FUROSEMIDE 40 MG PO TABS
40.0000 mg | ORAL_TABLET | Freq: Two times a day (BID) | ORAL | Status: DC
  Administered 2015-05-01 – 2015-05-02 (×2): 40 mg via ORAL
  Filled 2015-05-01 (×2): qty 1

## 2015-05-01 MED ORDER — IBUPROFEN 200 MG PO TABS: 600.0000 mg | ORAL_TABLET | Freq: Three times a day (TID) | ORAL | Status: DC | PRN

## 2015-05-01 MED ORDER — RAMIPRIL 1.25 MG PO CAPS
1.2500 mg | ORAL_CAPSULE | Freq: Every day | ORAL | Status: DC
  Administered 2015-05-01: 1.25 mg via ORAL
  Filled 2015-05-01 (×2): qty 1

## 2015-05-01 MED ORDER — ALBUTEROL (5 MG/ML) CONTINUOUS INHALATION SOLN
10.0000 mg/h | INHALATION_SOLUTION | Freq: Once | RESPIRATORY_TRACT | Status: AC
  Administered 2015-05-01: 10 mg/h via RESPIRATORY_TRACT
  Filled 2015-05-01: qty 20

## 2015-05-01 MED ORDER — ASPIRIN EC 81 MG PO TBEC
81.0000 mg | DELAYED_RELEASE_TABLET | Freq: Every day | ORAL | Status: DC
Start: 2015-05-01 — End: 2015-05-01

## 2015-05-01 MED ORDER — METHYLPREDNISOLONE SODIUM SUCC 125 MG IJ SOLR
125.0000 mg | Freq: Once | INTRAMUSCULAR | Status: AC
  Administered 2015-05-01: 125 mg via INTRAVENOUS
  Filled 2015-05-01: qty 2

## 2015-05-01 MED ORDER — ALBUTEROL SULFATE (2.5 MG/3ML) 0.083% IN NEBU
5.0000 mg | INHALATION_SOLUTION | Freq: Once | RESPIRATORY_TRACT | Status: AC
  Administered 2015-05-01: 5 mg via RESPIRATORY_TRACT
  Filled 2015-05-01: qty 6

## 2015-05-01 MED ORDER — ACETAMINOPHEN 325 MG PO TABS: 650.0000 mg | ORAL_TABLET | ORAL | Status: DC | PRN

## 2015-05-01 MED ORDER — SPIRONOLACTONE 25 MG PO TABS
25.0000 mg | ORAL_TABLET | Freq: Every day | ORAL | Status: DC
  Administered 2015-05-01: 25 mg via ORAL
  Filled 2015-05-01 (×2): qty 1

## 2015-05-01 MED ORDER — ZOLPIDEM TARTRATE 5 MG PO TABS: 5.0000 mg | ORAL_TABLET | Freq: Every evening | ORAL | Status: DC | PRN

## 2015-05-01 MED ORDER — CARVEDILOL 3.125 MG PO TABS
3.1250 mg | ORAL_TABLET | Freq: Two times a day (BID) | ORAL | Status: DC
  Administered 2015-05-01 – 2015-05-02 (×2): 3.125 mg via ORAL
  Filled 2015-05-01 (×5): qty 1

## 2015-05-01 MED ORDER — ALBUTEROL (5 MG/ML) CONTINUOUS INHALATION SOLN
10.0000 mg/h | INHALATION_SOLUTION | Freq: Once | RESPIRATORY_TRACT | Status: AC
  Administered 2015-05-01: 10 mg/h via RESPIRATORY_TRACT

## 2015-05-01 MED ORDER — ONDANSETRON HCL 4 MG PO TABS: 4.0000 mg | ORAL_TABLET | Freq: Three times a day (TID) | ORAL | Status: DC | PRN

## 2015-05-01 NOTE — ED Notes (Signed)
Dr. Aline Brochure, Ronald [name] notified of pt's c/o left chest tightness, pt in no acute distress, speaking complete sentences, skin warm and dry. Pt admits to hx of chronic intermittent/variable chest discomfort d/t hx of lobectomy and lung CA. Orders given for repeat troponin and admin x1 nitro. Pt states he normally takes nitro and lasix at home to manage his chest discomfort.

## 2015-05-01 NOTE — ED Notes (Signed)
Respiratory called for CAT.

## 2015-05-01 NOTE — ED Notes (Signed)
Pt will need assistance to RR

## 2015-05-01 NOTE — ED Notes (Addendum)
Pt 95% on room air. Pt reports he does not wear oxygen at all at home.

## 2015-05-01 NOTE — ED Notes (Signed)
Pt has 1 belonging bag at nurses station, IVC paperwork in orange folder in chart #5

## 2015-05-01 NOTE — BH Assessment (Signed)
Reviewed ED notes prior to initiating assessment. Per notes pt has had SI for the past two weeks and took 10 [employer] Pt has terminal lung cancer.   Pt to be monitored until 3 am but is otherwise cleared for assessment per Dr. Lyn Hollingshead note. Assessment to commence shortly.    Lear Ng, [facility] Triage Specialist 05/01/2015 2:13 AM

## 2015-05-01 NOTE — ED Notes (Signed)
At this time pt denies SI/HI. Contracts for safety.
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
## 2015-05-01 NOTE — ED Notes (Signed)
Pt started to complain of chest tightness and discomfort. Requested to have a nitro that he reports that he takes at home. Pt reported that his pain was a 8/10.

## 2015-05-01 NOTE — ED Provider Notes (Signed)
7:01 PM patient now complaining of some central chest tightness consistent with chest pain he has daily. It is typically relieved with nitroglycerin. Will also get EKG and send a troponin.  10:58 PM Pt has no complaints. Delta trop neg. He is asx currently, joking on exam. I turned his oxygen off as he is at 100%. Will cont to monitor. Plan is to continue w/ psych disposition.   Pamella Pert, MD 05/01/15 2259

## 2015-05-01 NOTE — ED Provider Notes (Addendum)
11:10- I was called to the room, for sudden onset of shortness of breath, which occurred, when the patient was walking to the bathroom. The patient is being observed, for suicidal attempt, by taking medication. He is anxious, uncomfortable, and has increased work of breathing. Abdomen is poor, both anteriorly and posteriorly. The patient is alert, but prep was unable to speak in sentences.  12:45- he is somewhat more comfortable, still has increased work of breathing, with somewhat improved air movement. He has marked intercostal retractions. There is generalized expiratory wheezing and poor air movement with auscultation. Repeat treatment, and Solu-Medrol ordered. Will check chest x-ray.  Medications  acetaminophen (TYLENOL) tablet 650 mg (not administered)  ibuprofen (ADVIL,MOTRIN) tablet 600 mg (not administered)  zolpidem (AMBIEN) tablet 5 mg (not administered)  nicotine (NICODERM CQ - dosed in mg/24 hours) patch 21 mg (not administered)  ondansetron (ZOFRAN) tablet 4 mg (not administered)  alum & mag hydroxide-simeth (MAALOX/MYLANTA) 200-200-20 MG/5ML suspension 30 mL (not administered)  albuterol (PROVENTIL HFA;VENTOLIN HFA) 108 (90 BASE) MCG/ACT inhaler 2 puff (not administered)  carvedilol (COREG) tablet 3.125 mg (not administered)  furosemide (LASIX) tablet 40 mg (not administered)  montelukast (SINGULAIR) tablet 10 mg (not administered)  prasugrel (EFFIENT) tablet 10 mg (not administered)  spironolactone (ALDACTONE) tablet 25 mg (not administered)  ramipril (ALTACE) capsule 1.25 mg (not administered)  zolpidem (AMBIEN) tablet 10 mg (not administered)  albuterol (PROVENTIL,VENTOLIN) solution continuous neb (not administered)  methylPREDNISolone sodium succinate (SOLU-MEDROL) 125 mg/2 mL injection 125 mg (not administered)  naloxone (NARCAN) injection 0.4 mg (0.4 mg Intravenous Given 04/30/15 2237)  albuterol (PROVENTIL,VENTOLIN) solution continuous neb (10 mg/hr Nebulization Given  05/01/15 1146)    Patient Vitals for the past 24 hrs:  BP Temp Temp src Pulse Resp SpO2  05/01/15 1125 133/81 mmHg - - 85 17 100 %  05/01/15 0945 123/85 mmHg - - 92 22 100 %  05/01/15 0835 96/67 mmHg - - 89 (!) 100 97 %  05/01/15 0615 99/67 mmHg - - 79 (!) 27 98 %  05/01/15 0530 103/55 mmHg - - 82 23 100 %  05/01/15 0500 116/55 mmHg - - 92 (!) 29 99 %  05/01/15 0415 99/70 mmHg - - 79 22 98 %  05/01/15 0400 108/68 mmHg - - 80 26 99 %  05/01/15 0345 102/67 mmHg - - 80 26 98 %  05/01/15 0330 106/73 mmHg - - 80 (!) 27 99 %  05/01/15 0230 99/60 mmHg - - 78 (!) 27 97 %  05/01/15 0215 92/65 mmHg - - 78 18 99 %  05/01/15 0200 102/66 mmHg - - 85 16 98 %  05/01/15 0145 (!) 84/51 mmHg - - 84 21 99 %  05/01/15 0130 99/67 mmHg - - 82 26 99 %  05/01/15 0115 91/63 mmHg - - 87 25 99 %  05/01/15 0100 94/68 mmHg - - 81 25 100 %  05/01/15 0045 97/71 mmHg - - 80 25 100 %  05/01/15 0030 94/58 mmHg - - 79 23 99 %  05/01/15 0015 98/57 mmHg - - 84 21 100 %  05/01/15 0000 95/72 mmHg - - 81 (!) 30 100 %  04/30/15 2345 100/73 mmHg - - 81 (!) 27 100 %  04/30/15 2330 112/75 mmHg - - 87 19 100 %  04/30/15 2315 92/71 mmHg - - 77 26 99 %  04/30/15 2300 (!) 88/57 mmHg - - 75 23 99 %  04/30/15 2245 90/55 mmHg - - 80 (!) 28 (!) 89 %  04/30/15 2207 - - - - - 99 %  04/30/15 2206 99/61 mmHg 98.6 F (37 C) Oral 81 22 99 %    Dg Chest Port 1 View  05/01/2015   CLINICAL DATA:  Lung cancer. Drug overdose. COPD. Short of breath.  EXAM: PORTABLE CHEST - 1 VIEW  COMPARISON:  03/06/2015  FINDINGS: Left pneumonectomy. Heart and mediastinum shifted to the left, unchanged.  Significant progression of airspace disease and prominent interstitial markings throughout the right lung. Small right pleural effusion.  IMPRESSION: Extensive airspace disease on the right with interstitial markings and small right effusion. This may represent recurrent congestive heart failure. Less likely is lymphangitic tumor from carcinoma lung.    Electronically Signed   By: Franchot Gallo M.D.   On: 05/01/2015 13:31    Results for orders placed or performed during the hospital encounter of 04/30/15  CBC  Result Value Ref Range   WBC 4.9 4.0 - 10.5 K/uL   RBC 4.24 4.22 - 5.81 MIL/uL   Hemoglobin 12.7 (L) 13.0 - 17.0 g/dL   HCT 36.7 (L) 39.0 - 52.0 %   MCV 86.6 78.0 - 100.0 fL   MCH 30.0 26.0 - 34.0 pg   MCHC 34.6 30.0 - 36.0 g/dL   RDW 15.5 11.5 - 15.5 %   Platelets 266 150 - 400 K/uL  Comprehensive metabolic panel  Result Value Ref Range   Sodium 139 135 - 145 mmol/L   Potassium 3.7 3.5 - 5.1 mmol/L   Chloride 109 101 - 111 mmol/L   CO2 23 22 - 32 mmol/L   Glucose, Bld 114 (H) 65 - 99 mg/dL   BUN 12 6 - 20 mg/dL   Creatinine, Ser 0.88 0.61 - 1.24 mg/dL   Calcium 8.4 (L) 8.9 - 10.3 mg/dL   Total Protein 6.8 6.5 - 8.1 g/dL   Albumin 3.2 (L) 3.5 - 5.0 g/dL   AST 21 15 - 41 U/L   ALT 12 (L) 17 - 63 U/L   Alkaline Phosphatase 73 38 - 126 U/L   Total Bilirubin 0.5 0.3 - 1.2 mg/dL   GFR calc non Af Amer >60 >60 mL/min   GFR calc Af Amer >60 >60 mL/min   Anion gap 7 5 - 15  Ethanol (ETOH)  Result Value Ref Range   Alcohol, Ethyl (B) <5 <5 mg/dL  Acetaminophen level  Result Value Ref Range   Acetaminophen (Tylenol), Serum <10 (L) 10 - 30 ug/mL  Salicylate level  Result Value Ref Range   Salicylate Lvl <1.0 2.8 - 30.0 mg/dL  Urine rapid drug screen (hosp performed)not at [facility]  Result Value Ref Range   Opiates NONE DETECTED NONE DETECTED   Cocaine NONE DETECTED NONE DETECTED   Benzodiazepines NONE DETECTED NONE DETECTED   Amphetamines NONE DETECTED NONE DETECTED   Tetrahydrocannabinol NONE DETECTED NONE DETECTED   Barbiturates NONE DETECTED NONE DETECTED  Blood gas, venous  Result Value Ref Range   FIO2 0.21 %   pH, Ven 7.413 (H) 7.250 - 7.300   pCO2, Ven 35.1 (L) 45.0 - 50.0 mmHg   pO2, Ven 59.8 (H) 30.0 - 45.0 mmHg   Bicarbonate 21.9 20.0 - 24.0 mEq/L   TCO2 19.7 0 - 100 mmol/L   Acid-base deficit 1.6 0.0 -  2.0 mmol/L   O2 Saturation 89.4 %   Patient temperature 98.6    Collection site VEIN    Drawn by COLLECTED BY LABORATORY    Sample type VEIN    Radiologic imaging  report reviewed and images by Radiography  - viewed, by me.  3:52 PM Reevaluation with update and discussion. After initial assessment and treatment, an updated evaluation reveals he is more comfortable from a respiratory point, at this time. Oxygen saturation is normal on 2 L nasal cannula. Lungs continue to have somewhat decreased bilaterally and scattered wheezes. Patient states that he has, not suicidal, and was not suicidal yesterday. I will have psychiatry reevaluate him. Gearldean Lomanto L   16:20- per psychiatry, they feel that he is at risk for suicide. They continue to want to add to a psychiatric facility. I have initiated IVC paperwork  Assessment-apparent suicidal attempt, with Ambien overdose. Psychiatry plans on admitting patient to a geropsychiatric facility. The patient had an acute respiratory event that is likely multifactorial. Prior cardiac ejection fraction is around 30%, 3 years ago on echo. He improved with nebulizer treatment here. Chest x-ray is somewhat concerning for congestive heart failure. BNP has been ordered. He is currently taking Lasix, when necessary. Last dose was 2 days ago. IV Lasix ordered here. Ongoing treatment ordered with prednisone and Lasix. 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 Of note, the patient does not have terminal lung cancer as he has alleged earlier. The patient is indicating that he will leave without continuing psychiatric care.   Plan: Continue evaluation and treatment for acute psychiatric disorder. Restart usual inhaler, 4 times a day with when necessary nebulizer treatments. Initiate prednisone 20 mg twice a day 5 days. Take Lasix twice a day, and attempt to wean off oxygen.  CRITICAL CARE Performed by:  Richarda Blade Total critical care time: 40 minutes Critical care time was exclusive of separately billable procedures and treating other patients. Critical care was necessary to treat or prevent imminent or life-threatening deterioration. Critical care was time spent personally by me on the following activities: development of treatment plan with patient and/or surrogate as well as nursing, discussions with consultants, evaluation of patient's response to treatment, examination of patient, obtaining history from patient or surrogate, ordering and performing treatments and interventions, ordering and review of laboratory studies, ordering and review of radiographic studies, pulse oximetry and re-evaluation of patient's condition.  Daleen Bo, MD 05/01/15 [place], MD 05/01/15 [phone]

## 2015-05-01 NOTE — Progress Notes (Signed)
Discussed at [place] Recommendations:  ? Geri psych/[place]; Try also [place], [facility] and [facility] [place] with TTS staff

## 2015-05-01 NOTE — BH Assessment (Signed)
Gero psych placement being sought due to pt's medical conditions and significant overdose attempt. Sent referrals to: [facility], [place], [facility], [place], [place], [facility], and [place] [place] Triage Specialist 05/01/2015 3:08 AM

## 2015-05-01 NOTE — ED Notes (Signed)
Pt now states chest pain 2/10. Requesting to walk around to "test his respiratory status". rn encouraged pt to rest for a little while longer because pt recently received nitroglycerin.

## 2015-05-01 NOTE — Consult Note (Signed)
[facility] Psychiatry Consult   Reason for Consult:  Major depressive disorder, single episode Referring Physician:  EDP Patient Identification: Ronald [name] MRN:  [id number] Principal Diagnosis: Major depressive disorder, single episode Diagnosis:   Patient Active Problem List   Diagnosis Date Noted  . Major depressive disorder, single episode [F32.9] 05/01/2015    Priority: High  . Acute systolic heart failure [C94.49] 10/30/2013  . COPD with acute exacerbation [J44.1] 09/16/2012  . Tobacco use [Z72.0] 09/16/2012  . COPD (chronic obstructive pulmonary disease) [J44.9] 09/15/2012  . Status post cardiac catheterization [Z98.89] 04/08/2012  . Acute systolic CHF (congestive heart failure) [I50.21] 04/08/2012  . Tobacco abuse [Z72.0] 04/08/2012  . Acute bronchitis [J20.9] 04/08/2012  . CAD (coronary artery disease) [I25.10] 04/06/2012  . Lung cancer [C34.90] 04/06/2012  . Mitral regurgitation [I34.0] 04/06/2012  . Cocaine abuse [F14.10] 04/06/2012    Total Time spent with patient: 45 minutes  Subjective:   Ronald [name] is a 59 y.o. male patient admitted with Major depressive disorder, single episode.  HPI: AA male, 59 years old was evaluated for OD on Ambien.  Patient admitted to taking 10 tablets of Ambien due to family and financial stress and stated that he want to die.  Patient reported medical issues as his stressors. Patient is diagnosed with Lung CA on oxygen, COPD  And HTN.  Patient reports feeling hopeless and helpless.  Patient denies any mental illness and does not take any medications for mental illness.  Patient reports poor sleep and appetite.  Patient made no eye contact with providers and was tearful.  Patient is unable to contract for safety and is accepted for admission.  HPI Elements:   Location:  Major depressive disorder, single episode, suicide attempt by OD on Ambien. Quality:  severe. Severity:  severe. Timing:  Acute. Duration:  Sudden, on  going. Context:  Brought in after OD on Ambien.  Past Medical History:  Past Medical History  Diagnosis Date  . COPD (chronic obstructive pulmonary disease)   . CHF (congestive heart failure)   . lung ca dx'd 2005    chemo/xrt comp 2005, lung ca  . Coronary artery disease   . Myocardial infarction   . Hypertension   . Shortness of breath     Past Surgical History  Procedure Laterality Date  . Coronary angioplasty with stent placement    . Pneumonectomy  2005    left  . Left heart catheterization with coronary angiogram N/A 04/07/2012    Procedure: LEFT HEART CATHETERIZATION WITH CORONARY ANGIOGRAM;  Surgeon: [name], MD;  Location: [facility] CATH LAB;  Service: Cardiovascular;  Laterality: N/A;   Family History:  Family History  Problem Relation Age of Onset  . Hypertension Other   . Diabetes Other    Social History:  History  Alcohol Use No    Comment: former     History  Drug Use  . Yes  . Special: Cocaine    History   Social History  . Marital Status: Single    Spouse Name: N/A  . Number of Children: N/A  . Years of Education: N/A   Social History Main Topics  . Smoking status: Former Smoker    Types: Cigarettes    Quit date: 09/30/2013  . Smokeless tobacco: Never Used  . Alcohol Use: No     Comment: former  . Drug Use: Yes    Special: Cocaine  . Sexual Activity: Not Currently   Other Topics Concern  . None  Social History Narrative   Additional Social History:    Pain Medications: See PTA Prescriptions: See PTA, reports he does not always take as prescribed "I lose track" Over the Counter: See PTA History of alcohol / drug use?: No history of alcohol / drug abuse Longest period of sobriety (when/how long): NA Negative Consequences of Use:  (NA) Withdrawal Symptoms:  (NA)                     Allergies:   Allergies  Allergen Reactions  . Penicillins Itching    Labs:  Results for orders placed or performed during the hospital  encounter of 04/30/15 (from the past 48 hour(s))  Blood gas, venous     Status: Abnormal   Collection Time: 04/30/15 10:35 PM  Result Value Ref Range   FIO2 0.21 %   pH, Ven 7.413 (H) 7.250 - 7.300   pCO2, Ven 35.1 (L) 45.0 - 50.0 mmHg   pO2, Ven 59.8 (H) 30.0 - 45.0 mmHg   Bicarbonate 21.9 20.0 - 24.0 mEq/L   TCO2 19.7 0 - 100 mmol/L   Acid-base deficit 1.6 0.0 - 2.0 mmol/L   O2 Saturation 89.4 %   Patient temperature 98.6    Collection site VEIN    Drawn by COLLECTED BY LABORATORY    Sample type VEIN   CBC     Status: Abnormal   Collection Time: 04/30/15 10:37 PM  Result Value Ref Range   WBC 4.9 4.0 - 10.5 K/uL   RBC 4.24 4.22 - 5.81 MIL/uL   Hemoglobin 12.7 (L) 13.0 - 17.0 g/dL   HCT 36.7 (L) 39.0 - 52.0 %   MCV 86.6 78.0 - 100.0 fL   MCH 30.0 26.0 - 34.0 pg   MCHC 34.6 30.0 - 36.0 g/dL   RDW 15.5 11.5 - 15.5 %   Platelets 266 150 - 400 K/uL  Comprehensive metabolic panel     Status: Abnormal   Collection Time: 04/30/15 10:37 PM  Result Value Ref Range   Sodium 139 135 - 145 mmol/L   Potassium 3.7 3.5 - 5.1 mmol/L   Chloride 109 101 - 111 mmol/L   CO2 23 22 - 32 mmol/L   Glucose, Bld 114 (H) 65 - 99 mg/dL   BUN 12 6 - 20 mg/dL   Creatinine, Ser 0.88 0.61 - 1.24 mg/dL   Calcium 8.4 (L) 8.9 - 10.3 mg/dL   Total Protein 6.8 6.5 - 8.1 g/dL   Albumin 3.2 (L) 3.5 - 5.0 g/dL   AST 21 15 - 41 U/L   ALT 12 (L) 17 - 63 U/L   Alkaline Phosphatase 73 38 - 126 U/L   Total Bilirubin 0.5 0.3 - 1.2 mg/dL   GFR calc non Af Amer >60 >60 mL/min   GFR calc Af Amer >60 >60 mL/min    Comment: (NOTE) The eGFR has been calculated using the CKD EPI equation. This calculation has not been validated in all clinical situations. eGFR's persistently <60 mL/min signify possible Chronic Kidney Disease.    Anion gap 7 5 - 15  Ethanol (ETOH)     Status: None   Collection Time: 04/30/15 10:37 PM  Result Value Ref Range   Alcohol, Ethyl (B) <5 <5 mg/dL    Comment:        LOWEST DETECTABLE  LIMIT FOR SERUM ALCOHOL IS 5 mg/dL FOR MEDICAL PURPOSES ONLY   Acetaminophen level     Status: Abnormal   Collection Time: 04/30/15 10:37  PM  Result Value Ref Range   Acetaminophen (Tylenol), Serum <10 (L) 10 - 30 ug/mL    Comment:        THERAPEUTIC CONCENTRATIONS VARY SIGNIFICANTLY. A RANGE OF 10-30 ug/mL MAY BE AN EFFECTIVE CONCENTRATION FOR MANY PATIENTS. HOWEVER, SOME ARE BEST TREATED AT CONCENTRATIONS OUTSIDE THIS RANGE. ACETAMINOPHEN CONCENTRATIONS >150 ug/mL AT 4 HOURS AFTER INGESTION AND >50 ug/mL AT 12 HOURS AFTER INGESTION ARE OFTEN ASSOCIATED WITH TOXIC REACTIONS.   Salicylate level     Status: None   Collection Time: 04/30/15 10:37 PM  Result Value Ref Range   Salicylate Lvl <1.6 2.8 - 30.0 mg/dL  Urine rapid drug screen (hosp performed)not at [facility]     Status: None   Collection Time: 05/01/15  2:10 AM  Result Value Ref Range   Opiates NONE DETECTED NONE DETECTED   Cocaine NONE DETECTED NONE DETECTED   Benzodiazepines NONE DETECTED NONE DETECTED   Amphetamines NONE DETECTED NONE DETECTED   Tetrahydrocannabinol NONE DETECTED NONE DETECTED   Barbiturates NONE DETECTED NONE DETECTED    Comment:        DRUG SCREEN FOR MEDICAL PURPOSES ONLY.  IF CONFIRMATION IS NEEDED FOR ANY PURPOSE, NOTIFY LAB WITHIN 5 DAYS.        LOWEST DETECTABLE LIMITS FOR URINE DRUG SCREEN Drug Class       Cutoff (ng/mL) Amphetamine      1000 Barbiturate      200 Benzodiazepine   109 Tricyclics       604 Opiates          300 Cocaine          300 THC              50     Vitals: Blood pressure 123/85, pulse 92, temperature 98.6 F (37 C), temperature source Oral, resp. rate 22, SpO2 100 %.  Risk to Self: Suicidal Ideation: Yes-Currently Present Suicidal Intent: Yes-Currently Present Is patient at risk for suicide?: Yes Suicidal Plan?: Yes-Currently Present Specify Current Suicidal Plan: pt took 10 Ambien prior to arrival  Access to Means: Yes Specify Access to Suicidal  Means: medications What has been your use of drugs/alcohol within the last 12 months?: Pt denies use of etoh or illicit drugs, reports was a smoker How many times?: 0 Other Self Harm Risks: none Triggers for Past Attempts: None known Intentional Self Injurious Behavior: None Risk to Others: Homicidal Ideation: No Thoughts of Harm to Others: No Current Homicidal Intent: No Current Homicidal Plan: No Access to Homicidal Means: No Identified Victim: none History of harm to others?: No Assessment of Violence: None Noted Violent Behavior Description: none Does patient have access to weapons?: No Criminal Charges Pending?: No Does patient have a court date: No Prior Inpatient Therapy: Prior Inpatient Therapy: No Prior Therapy Dates: NA Prior Therapy Facilty/Provider(s): NA Reason for Treatment: NA Prior Outpatient Therapy: Prior Outpatient Therapy: No Prior Therapy Dates: NA Prior Therapy Facilty/Provider(s): NA Reason for Treatment: NA Does patient have an ACCT team?: No Does patient have Intensive In-House Services?  : No Does patient have [facility] services? : No Does patient have P4CC services?: No  Current Facility-Administered Medications  Medication Dose Route Frequency Provider Last Rate Last Dose  . acetaminophen (TYLENOL) tablet 650 mg  650 mg Oral Q4H PRN Daleen Bo, MD      . albuterol (PROVENTIL,VENTOLIN) solution continuous neb  10 mg/hr Nebulization Once Daleen Bo, MD      . alum & mag hydroxide-simeth (MAALOX/MYLANTA) 200-200-20 MG/5ML  suspension 30 mL  30 mL Oral PRN Daleen Bo, MD      . ibuprofen (ADVIL,MOTRIN) tablet 600 mg  600 mg Oral Q8H PRN Daleen Bo, MD      . nicotine (NICODERM CQ - dosed in mg/24 hours) patch 21 mg  21 mg Transdermal Daily PRN Daleen Bo, MD      . ondansetron [facility]) tablet 4 mg  4 mg Oral Q8H PRN Daleen Bo, MD      . zolpidem (AMBIEN) tablet 5 mg  5 mg Oral QHS PRN Daleen Bo, MD       Current Outpatient  Prescriptions  Medication Sig Dispense Refill  . albuterol (PROVENTIL HFA;VENTOLIN HFA) 108 (90 BASE) MCG/ACT inhaler Inhale 2 puffs into the lungs every 4 (four) hours as needed for wheezing or shortness of breath. 1 Inhaler 0  . carvedilol (COREG) 3.125 MG tablet Take 3.125 mg by mouth 2 (two) times daily with a meal.    . furosemide (LASIX) 40 MG tablet Take 1 tablet (40 mg total) by mouth 2 (two) times daily. Twice a day for 3 days, then back to normal dosing. (Patient taking differently: Take 40 mg by mouth every other day. ) 3 tablet 0  . ibuprofen (ADVIL,MOTRIN) 800 MG tablet Take 800 mg by mouth every 8 (eight) hours as needed for mild pain.    . montelukast (SINGULAIR) 10 MG tablet Take 10 mg by mouth at bedtime.    . nitroGLYCERIN (NITROSTAT) 0.4 MG SL tablet Place 1 tablet (0.4 mg total) under the tongue every 5 (five) minutes x 3 doses as needed for chest pain. 25 tablet 12  . prasugrel (EFFIENT) 10 MG TABS Take 10 mg by mouth daily.    . ramipril (ALTACE) 1.25 MG capsule Take 1 capsule (1.25 mg total) by mouth daily. 30 capsule 3  . spironolactone (ALDACTONE) 25 MG tablet Take 25 mg by mouth daily.  0  . zolpidem (AMBIEN) 10 MG tablet Take 10 mg by mouth at bedtime.  0  . aspirin EC 81 MG EC tablet Take 1 tablet (81 mg total) by mouth daily. (Patient not taking: Reported on 05/01/2015) 30 tablet 3  . atorvastatin (LIPITOR) 80 MG tablet Take 1 tablet (80 mg total) by mouth daily at 6 PM. (Patient not taking: Reported on 05/01/2015) 30 tablet 3    Musculoskeletal: Strength & Muscle Tone: within normal limits Gait & Station: normal Patient leans: N/A  Psychiatric Specialty Exam: Physical Exam  Review of Systems  Unable to perform ROS: mental acuity    Blood pressure 123/85, pulse 92, temperature 98.6 F (37 C), temperature source Oral, resp. rate 22, SpO2 100 %.There is no weight on file to calculate BMI.  General Appearance: Casual  Eye Contact::  Poor  Speech:  Clear and  Coherent and Normal Rate  Volume:  Normal  Mood:  Angry, Anxious, Depressed, Hopeless, Irritable and helpless  Affect:  Congruent, Depressed and Flat  Thought Process:  Coherent, Goal Directed and Intact  Orientation:  Full (Time, Place, and Person)  Thought Content:  WDL  Suicidal Thoughts:  Yes.  without intent/plan  Homicidal Thoughts:  No  Memory:  Immediate;   Good Recent;   Good Remote;   Good  Judgement:  Impaired  Insight:  Shallow  Psychomotor Activity:  Normal  Concentration:  Fair  Recall:  NA  Fund of Knowledge:Fair  Language: Good  Akathisia:  NA  Handed:  Right  AIMS (if indicated):     Assets:  Desire for Improvement  ADL's:  Intact  Cognition: WNL  Sleep:      Medical Decision Making: Review of Psycho-Social Stressors (1), Established Problem, Worsening (2), Review of Medication Regimen & Side Effects (2) and Review of New Medication or Change in Dosage (2)  Treatment Plan Summary: Daily contact with patient to assess and evaluate symptoms and progress in treatment and Medication management  Plan:  Resume all Medical medications till cleared medically. Disposition: Admit to Gero[facility]psychiatric hospital  Delfin Gant   PMHNP-BC 05/01/2015 11:18 AM Patient seen face-to-face for psychiatric evaluation, chart reviewed and case discussed with the physician extender and developed treatment plan. Reviewed the information documented and agree with the treatment plan. Corena Pilgrim, MD

## 2015-05-01 NOTE — BH Assessment (Signed)
[facility] Assessment Progress Note  The following facilities have been contacted to seek placement for this pt, with results as noted:  Beds available, information sent, decision pending:  [facility]   At capacity:  [place], [place] Triage Specialist [phone]

## 2015-05-02 DIAGNOSIS — T426X1A Poisoning by other antiepileptic and sedative-hypnotic drugs, accidental (unintentional), initial encounter: Secondary | ICD-10-CM | POA: Diagnosis not present

## 2015-05-02 DIAGNOSIS — T426X2A Poisoning by other antiepileptic and sedative-hypnotic drugs, intentional self-harm, initial encounter: Secondary | ICD-10-CM | POA: Diagnosis not present

## 2015-05-02 DIAGNOSIS — F329 Major depressive disorder, single episode, unspecified: Secondary | ICD-10-CM | POA: Diagnosis not present

## 2015-05-02 MED ORDER — NICOTINE 21 MG/24HR TD PT24: 21.0000 mg | MEDICATED_PATCH | Freq: Every day | TRANSDERMAL | Status: DC | PRN

## 2015-05-02 MED ORDER — RAMIPRIL 1.25 MG PO CAPS: 1.2500 mg | ORAL_CAPSULE | Freq: Every day | ORAL | Status: DC

## 2015-05-02 MED ORDER — SPIRONOLACTONE 25 MG PO TABS: 25.0000 mg | ORAL_TABLET | Freq: Every day | ORAL | Status: DC

## 2015-05-02 MED ORDER — CARVEDILOL 3.125 MG PO TABS: 3.1250 mg | ORAL_TABLET | Freq: Two times a day (BID) | ORAL | Status: DC

## 2015-05-02 MED ORDER — PRASUGREL HCL 10 MG PO TABS: 10.0000 mg | ORAL_TABLET | Freq: Every day | ORAL | Status: DC

## 2015-05-02 MED ORDER — PREDNISONE 20 MG PO TABS: 20.0000 mg | ORAL_TABLET | Freq: Two times a day (BID) | ORAL | Status: DC

## 2015-05-02 MED ORDER — FUROSEMIDE 40 MG PO TABS: 40.0000 mg | ORAL_TABLET | Freq: Two times a day (BID) | ORAL | Status: DC

## 2015-05-02 MED ORDER — MONTELUKAST SODIUM 10 MG PO TABS: 10.0000 mg | ORAL_TABLET | Freq: Every day | ORAL | Status: DC

## 2015-05-02 MED ORDER — FLUOXETINE HCL 10 MG PO CAPS
10.0000 mg | ORAL_CAPSULE | Freq: Every day | ORAL | Status: DC
  Filled 2015-05-02: qty 1

## 2015-05-02 NOTE — BH Assessment (Signed)
Ronald [name] Assessment Progress Note  Per Corena Pilgrim, MD this pt does not require psychiatric hospitalization at this time.  He is to be released from IVC and discharged from [facility].  Pt prefers to follow up with his PCP.  Dr. Darleene Cleaver asks that pt's son, Ronald [name] [phone]), be called to notify him.  Pt signed Consent to Release Information to the son, and at 12:15 a notification call was placed.  Pt's nurse has been notified.  Jalene Mullet, MA Triage Specialist [phone]

## 2015-05-02 NOTE — ED Notes (Signed)
Pt is getting irritable, wanting to leave. Dr Johnney Killian notified pt is cleared from psychiatric care and they want EDP to clear him before he goes home.

## 2015-05-02 NOTE — [facility] Suicide Risk Assessment (Signed)
     [facility] Admission Suicide Risk Assessment   Nursing information obtained from:    Demographic factors:    Current Mental Status:    Loss Factors:    Historical Factors:    Risk Reduction Factors:    Total Time spent with patient: 25 minutes Principal Problem: Major depressive disorder, single episode Diagnosis:   Patient Active Problem List   Diagnosis Date Noted  . Major depressive disorder, single episode [F32.9] 05/01/2015  . Drug overdose, intentional [T50.902A]   . Acute systolic heart failure [Y86.57] 10/30/2013  . COPD with acute exacerbation [J44.1] 09/16/2012  . Tobacco use [Z72.0] 09/16/2012  . COPD (chronic obstructive pulmonary disease) [J44.9] 09/15/2012  . Status post cardiac catheterization [Z98.89] 04/08/2012  . Acute systolic CHF (congestive heart failure) [I50.21] 04/08/2012  . Tobacco abuse [Z72.0] 04/08/2012  . Acute bronchitis [J20.9] 04/08/2012  . CAD (coronary artery disease) [I25.10] 04/06/2012  . Lung cancer [C34.90] 04/06/2012  . Mitral regurgitation [I34.0] 04/06/2012  . Cocaine abuse [F14.10] 04/06/2012     Continued Clinical Symptoms:    The "Alcohol Use Disorders Identification Test", Guidelines for Use in Primary Care, Second Edition.  World Pharmacologist [facility]). Score between 0-7:  no or low risk or alcohol related problems. Score between 8-15:  moderate risk of alcohol related problems. Score between 16-19:  high risk of alcohol related problems. Score 20 or above:  warrants further diagnostic evaluation for alcohol dependence and treatment.   CLINICAL FACTORS:   Depression:   Anhedonia Hopelessness Insomnia   Musculoskeletal: Strength & Muscle Tone: within normal limits Gait & Station: normal Patient leans: N/A  Psychiatric Specialty Exam:     Blood pressure 111/56, pulse 72, temperature 98.3 F (36.8 C), temperature source Oral, resp. rate 16, SpO2 95 %.There is no weight on file to calculate BMI.     General  Appearance: Casual  Eye Contact:: Poor  Speech: Clear and Coherent and Normal Rate  Volume: Normal  Mood: Anxious and Irritable  Affect: Appropriate and Congruent  Thought Process: Coherent, Goal Directed and Intact  Orientation: Full (Time, Place, and Person)  Thought Content: WDL  Suicidal Thoughts: No  Homicidal Thoughts: No  Memory: Immediate; Good Recent; Good Remote; Good  Judgement: Impaired  Insight: Shallow  Psychomotor Activity: Normal  Concentration: Fair  Recall: NA  Fund of Knowledge:Fair  Language: Good  Akathisia: NA  Handed: Right  AIMS (if indicated):    Assets: Desire for Improvement  ADL's: Intact  Cognition: WNL  Sleep:          COGNITIVE FEATURES THAT CONTRIBUTE TO RISK:  Closed-mindedness    SUICIDE RISK:   Minimal: No identifiable suicidal ideation.  Patients presenting with no risk factors but with morbid ruminations; may be classified as minimal risk based on the severity of the depressive symptoms  Current Mental Status: Alert/oriented x4, calm, cooperative, intermittently irritable about discharge "taking too long," but otherwise appropriate. Pt denies suicidal ideation, able to contract for safety, and will discharge home with son.   PLAN OF CARE: Discharge home with outpatient follow-up  Medical Decision Making:  Established Problem, Stable/Improving (1), Review of Psycho-Social Stressors (1), Review or order clinical lab tests (1), Independent Review of image, tracing or specimen (2) and Review of Medication Regimen & Side Effects (2)   Benjamine Mola, FNP-BC 05/02/2015      11:33 AM

## 2015-05-02 NOTE — ED Notes (Signed)
Pt declined to take his medication, sts he will be discharged back home and will take his medications as he usually does at home.

## 2015-05-02 NOTE — Discharge Instructions (Signed)

## 2015-05-02 NOTE — Consult Note (Signed)
[facility] Psychiatry Consult   Reason for Consult:  Major depressive disorder, single episode Referring Physician:  EDP Patient Identification: Ronald [name] MRN:  [id number] Principal Diagnosis: Major depressive disorder, single episode Diagnosis:   Patient Active Problem List   Diagnosis Date Noted  . Major depressive disorder, single episode [F32.9] 05/01/2015  . Drug overdose, intentional [T50.902A]   . Acute systolic heart failure [N46.27] 10/30/2013  . COPD with acute exacerbation [J44.1] 09/16/2012  . Tobacco use [Z72.0] 09/16/2012  . COPD (chronic obstructive pulmonary disease) [J44.9] 09/15/2012  . Status post cardiac catheterization [Z98.89] 04/08/2012  . Acute systolic CHF (congestive heart failure) [I50.21] 04/08/2012  . Tobacco abuse [Z72.0] 04/08/2012  . Acute bronchitis [J20.9] 04/08/2012  . CAD (coronary artery disease) [I25.10] 04/06/2012  . Lung cancer [C34.90] 04/06/2012  . Mitral regurgitation [I34.0] 04/06/2012  . Cocaine abuse [F14.10] 04/06/2012    Total Time spent with patient: 25 minutes  Subjective:   Ronald [name] is a 59 y.o. male patient admitted with Major depressive disorder, single episode. Pt has now improved and is able to contract for safety. Per Dr. Darleene Cleaver, pt can have his IVC rescinded and may go home with outpatient follow-up.   HPI: AA male, 58 years old was evaluated for OD on Ambien.  Patient admitted to taking 10 tablets of Ambien due to family and financial stress and stated that he want to die.  Patient reported medical issues as his stressors. Patient is diagnosed with Lung CA on oxygen, COPD  And HTN.  Patient reports feeling hopeless and helpless.  Patient denies any mental illness and does not take any medications for mental illness.  Patient reports poor sleep and appetite.  Patient made no eye contact with providers and was tearful.  Patient is unable to contract for safety and is accepted for admission.  HPI Elements:    Location:  Major depressive disorder, single episode, suicide attempt by OD on Ambien. Quality:  severe. Severity:  severe. Timing:  Acute. Duration:  Sudden, on going. Context:  Brought in after OD on Ambien.  Past Medical History:  Past Medical History  Diagnosis Date  . COPD (chronic obstructive pulmonary disease)   . CHF (congestive heart failure)   . lung ca dx'd 2005    chemo/xrt comp 2005, lung ca  . Coronary artery disease   . Myocardial infarction   . Hypertension   . Shortness of breath     Past Surgical History  Procedure Laterality Date  . Coronary angioplasty with stent placement    . Pneumonectomy  2005    left  . Left heart catheterization with coronary angiogram N/A 04/07/2012    Procedure: LEFT HEART CATHETERIZATION WITH CORONARY ANGIOGRAM;  Surgeon: [name], MD;  Location: [facility] CATH LAB;  Service: Cardiovascular;  Laterality: N/A;   Family History:  Family History  Problem Relation Age of Onset  . Hypertension Other   . Diabetes Other    Social History:  History  Alcohol Use No    Comment: former     History  Drug Use  . Yes  . Special: Cocaine    History   Social History  . Marital Status: Single    Spouse Name: N/A  . Number of Children: N/A  . Years of Education: N/A   Social History Main Topics  . Smoking status: Former Smoker    Types: Cigarettes    Quit date: 09/30/2013  . Smokeless tobacco: Never Used  . Alcohol Use: No  Comment: former  . Drug Use: Yes    Special: Cocaine  . Sexual Activity: Not Currently   Other Topics Concern  . None   Social History Narrative   Additional Social History:    Pain Medications: See PTA Prescriptions: See PTA, reports he does not always take as prescribed "I lose track" Over the Counter: See PTA History of alcohol / drug use?: No history of alcohol / drug abuse Longest period of sobriety (when/how long): NA Negative Consequences of Use:  (NA) Withdrawal Symptoms:  (NA)                      Allergies:   Allergies  Allergen Reactions  . Penicillins Itching    Labs:  Results for orders placed or performed during the hospital encounter of 04/30/15 (from the past 48 hour(s))  Blood gas, venous     Status: Abnormal   Collection Time: 04/30/15 10:35 PM  Result Value Ref Range   FIO2 0.21 %   pH, Ven 7.413 (H) 7.250 - 7.300   pCO2, Ven 35.1 (L) 45.0 - 50.0 mmHg   pO2, Ven 59.8 (H) 30.0 - 45.0 mmHg   Bicarbonate 21.9 20.0 - 24.0 mEq/L   TCO2 19.7 0 - 100 mmol/L   Acid-base deficit 1.6 0.0 - 2.0 mmol/L   O2 Saturation 89.4 %   Patient temperature 98.6    Collection site VEIN    Drawn by COLLECTED BY LABORATORY    Sample type VEIN   CBC     Status: Abnormal   Collection Time: 04/30/15 10:37 PM  Result Value Ref Range   WBC 4.9 4.0 - 10.5 K/uL   RBC 4.24 4.22 - 5.81 MIL/uL   Hemoglobin 12.7 (L) 13.0 - 17.0 g/dL   HCT 36.7 (L) 39.0 - 52.0 %   MCV 86.6 78.0 - 100.0 fL   MCH 30.0 26.0 - 34.0 pg   MCHC 34.6 30.0 - 36.0 g/dL   RDW 15.5 11.5 - 15.5 %   Platelets 266 150 - 400 K/uL  Comprehensive metabolic panel     Status: Abnormal   Collection Time: 04/30/15 10:37 PM  Result Value Ref Range   Sodium 139 135 - 145 mmol/L   Potassium 3.7 3.5 - 5.1 mmol/L   Chloride 109 101 - 111 mmol/L   CO2 23 22 - 32 mmol/L   Glucose, Bld 114 (H) 65 - 99 mg/dL   BUN 12 6 - 20 mg/dL   Creatinine, Ser 0.88 0.61 - 1.24 mg/dL   Calcium 8.4 (L) 8.9 - 10.3 mg/dL   Total Protein 6.8 6.5 - 8.1 g/dL   Albumin 3.2 (L) 3.5 - 5.0 g/dL   AST 21 15 - 41 U/L   ALT 12 (L) 17 - 63 U/L   Alkaline Phosphatase 73 38 - 126 U/L   Total Bilirubin 0.5 0.3 - 1.2 mg/dL   GFR calc non Af Amer >60 >60 mL/min   GFR calc Af Amer >60 >60 mL/min    Comment: (NOTE) The eGFR has been calculated using the CKD EPI equation. This calculation has not been validated in all clinical situations. eGFR's persistently <60 mL/min signify possible Chronic Kidney Disease.    Anion gap 7 5 - 15   Ethanol (ETOH)     Status: None   Collection Time: 04/30/15 10:37 PM  Result Value Ref Range   Alcohol, Ethyl (B) <5 <5 mg/dL    Comment:        LOWEST  DETECTABLE LIMIT FOR SERUM ALCOHOL IS 5 mg/dL FOR MEDICAL PURPOSES ONLY   Acetaminophen level     Status: Abnormal   Collection Time: 04/30/15 10:37 PM  Result Value Ref Range   Acetaminophen (Tylenol), Serum <10 (L) 10 - 30 ug/mL    Comment:        THERAPEUTIC CONCENTRATIONS VARY SIGNIFICANTLY. A RANGE OF 10-30 ug/mL MAY BE AN EFFECTIVE CONCENTRATION FOR MANY PATIENTS. HOWEVER, SOME ARE BEST TREATED AT CONCENTRATIONS OUTSIDE THIS RANGE. ACETAMINOPHEN CONCENTRATIONS >150 ug/mL AT 4 HOURS AFTER INGESTION AND >50 ug/mL AT 12 HOURS AFTER INGESTION ARE OFTEN ASSOCIATED WITH TOXIC REACTIONS.   Salicylate level     Status: None   Collection Time: 04/30/15 10:37 PM  Result Value Ref Range   Salicylate Lvl <9.6 2.8 - 30.0 mg/dL  Urine rapid drug screen (hosp performed)not at [facility]     Status: None   Collection Time: 05/01/15  2:10 AM  Result Value Ref Range   Opiates NONE DETECTED NONE DETECTED   Cocaine NONE DETECTED NONE DETECTED   Benzodiazepines NONE DETECTED NONE DETECTED   Amphetamines NONE DETECTED NONE DETECTED   Tetrahydrocannabinol NONE DETECTED NONE DETECTED   Barbiturates NONE DETECTED NONE DETECTED    Comment:        DRUG SCREEN FOR MEDICAL PURPOSES ONLY.  IF CONFIRMATION IS NEEDED FOR ANY PURPOSE, NOTIFY LAB WITHIN 5 DAYS.        LOWEST DETECTABLE LIMITS FOR URINE DRUG SCREEN Drug Class       Cutoff (ng/mL) Amphetamine      1000 Barbiturate      200 Benzodiazepine   283 Tricyclics       662 Opiates          300 Cocaine          300 THC              50   Brain natriuretic peptide     Status: Abnormal   Collection Time: 05/01/15  4:37 PM  Result Value Ref Range   B Natriuretic Peptide 374.2 (H) 0.0 - 100.0 pg/mL  I-stat troponin, ED     Status: None   Collection Time: 05/01/15  6:45 PM  Result Value  Ref Range   Troponin i, poc 0.03 0.00 - 0.08 ng/mL   Comment 3            Comment: Due to the release kinetics of cTnI, a negative result within the first hours of the onset of symptoms does not rule out myocardial infarction with certainty. If myocardial infarction is still suspected, repeat the test at appropriate intervals.   I-stat troponin, ED     Status: None   Collection Time: 05/01/15  9:58 PM  Result Value Ref Range   Troponin i, poc 0.02 0.00 - 0.08 ng/mL   Comment 3            Comment: Due to the release kinetics of cTnI, a negative result within the first hours of the onset of symptoms does not rule out myocardial infarction with certainty. If myocardial infarction is still suspected, repeat the test at appropriate intervals.     Vitals: Blood pressure 102/65, pulse 76, temperature 98 F (36.7 C), temperature source Oral, resp. rate 16, SpO2 97 %.  Risk to Self: Suicidal Ideation: Yes-Currently Present Suicidal Intent: Yes-Currently Present Is patient at risk for suicide?: Yes Suicidal Plan?: Yes-Currently Present Specify Current Suicidal Plan: pt took 10 Ambien prior to arrival  Access to Means: Yes  Specify Access to Suicidal Means: medications What has been your use of drugs/alcohol within the last 12 months?: Pt denies use of etoh or illicit drugs, reports was a smoker How many times?: 0 Other Self Harm Risks: none Triggers for Past Attempts: None known Intentional Self Injurious Behavior: None Risk to Others: Homicidal Ideation: No Thoughts of Harm to Others: No Current Homicidal Intent: No Current Homicidal Plan: No Access to Homicidal Means: No Identified Victim: none History of harm to others?: No Assessment of Violence: None Noted Violent Behavior Description: none Does patient have access to weapons?: No Criminal Charges Pending?: No Does patient have a court date: No Prior Inpatient Therapy: Prior Inpatient Therapy: No Prior Therapy Dates:  NA Prior Therapy Facilty/Provider(s): NA Reason for Treatment: NA Prior Outpatient Therapy: Prior Outpatient Therapy: No Prior Therapy Dates: NA Prior Therapy Facilty/Provider(s): NA Reason for Treatment: NA Does patient have an ACCT team?: No Does patient have Intensive In-House Services?  : No Does patient have [facility] services? : No Does patient have P4CC services?: No  Current Facility-Administered Medications  Medication Dose Route Frequency Provider Last Rate Last Dose  . acetaminophen (TYLENOL) tablet 650 mg  650 mg Oral Q4H PRN Daleen Bo, MD      . albuterol (PROVENTIL HFA;VENTOLIN HFA) 108 (90 BASE) MCG/ACT inhaler 2 puff  2 puff Inhalation Q4H PRN Daleen Bo, MD      . albuterol (PROVENTIL) (2.5 MG/3ML) 0.083% nebulizer solution 5 mg  5 mg Nebulization Q6H PRN Pamella Pert, MD      . alum & mag hydroxide-simeth (MAALOX/MYLANTA) 200-200-20 MG/5ML suspension 30 mL  30 mL Oral PRN Daleen Bo, MD      . carvedilol (COREG) tablet 3.125 mg  3.125 mg Oral BID WC Daleen Bo, MD   3.125 mg at 05/02/15 0827  . furosemide (LASIX) tablet 40 mg  40 mg Oral BID Daleen Bo, MD   40 mg at 05/02/15 0827  . ibuprofen (ADVIL,MOTRIN) tablet 600 mg  600 mg Oral Q8H PRN Daleen Bo, MD      . montelukast (SINGULAIR) tablet 10 mg  10 mg Oral QHS Daleen Bo, MD   10 mg at 05/01/15 2342  . nicotine (NICODERM CQ - dosed in mg/24 hours) patch 21 mg  21 mg Transdermal Daily PRN Daleen Bo, MD      . nitroGLYCERIN (NITROSTAT) SL tablet 0.4 mg  0.4 mg Sublingual Q5 Min x 3 PRN Pamella Pert, MD   0.4 mg at 05/01/15 2010  . ondansetron (ZOFRAN) tablet 4 mg  4 mg Oral Q8H PRN Daleen Bo, MD      . prasugrel (EFFIENT) tablet 10 mg  10 mg Oral Daily Daleen Bo, MD   10 mg at 05/01/15 1255  . predniSONE (DELTASONE) tablet 20 mg  20 mg Oral BID WC Daleen Bo, MD   20 mg at 05/02/15 0827  . ramipril (ALTACE) capsule 1.25 mg  1.25 mg Oral Daily Daleen Bo, MD   1.25 mg at  05/01/15 1255  . spironolactone (ALDACTONE) tablet 25 mg  25 mg Oral Daily Daleen Bo, MD   25 mg at 05/01/15 1255  . zolpidem (AMBIEN) tablet 5 mg  5 mg Oral QHS PRN Daleen Bo, MD       Current Outpatient Prescriptions  Medication Sig Dispense Refill  . albuterol (PROVENTIL HFA;VENTOLIN HFA) 108 (90 BASE) MCG/ACT inhaler Inhale 2 puffs into the lungs every 4 (four) hours as needed for wheezing or shortness of breath. 1 Inhaler 0  .  carvedilol (COREG) 3.125 MG tablet Take 3.125 mg by mouth 2 (two) times daily with a meal.    . furosemide (LASIX) 40 MG tablet Take 1 tablet (40 mg total) by mouth 2 (two) times daily. Twice a day for 3 days, then back to normal dosing. (Patient taking differently: Take 40 mg by mouth every other day. ) 3 tablet 0  . ibuprofen (ADVIL,MOTRIN) 800 MG tablet Take 800 mg by mouth every 8 (eight) hours as needed for mild pain.    . montelukast (SINGULAIR) 10 MG tablet Take 10 mg by mouth at bedtime.    . nitroGLYCERIN (NITROSTAT) 0.4 MG SL tablet Place 1 tablet (0.4 mg total) under the tongue every 5 (five) minutes x 3 doses as needed for chest pain. 25 tablet 12  . prasugrel (EFFIENT) 10 MG TABS Take 10 mg by mouth daily.    . ramipril (ALTACE) 1.25 MG capsule Take 1 capsule (1.25 mg total) by mouth daily. 30 capsule 3  . spironolactone (ALDACTONE) 25 MG tablet Take 25 mg by mouth daily.  0  . zolpidem (AMBIEN) 10 MG tablet Take 10 mg by mouth at bedtime.  0  . aspirin EC 81 MG EC tablet Take 1 tablet (81 mg total) by mouth daily. (Patient not taking: Reported on 05/01/2015) 30 tablet 3  . atorvastatin (LIPITOR) 80 MG tablet Take 1 tablet (80 mg total) by mouth daily at 6 PM. (Patient not taking: Reported on 05/01/2015) 30 tablet 3    Musculoskeletal: Strength & Muscle Tone: within normal limits Gait & Station: normal Patient leans: N/A  Psychiatric Specialty Exam: Physical Exam  Review of Systems  Unable to perform ROS: mental acuity   Psychiatric/Behavioral: Positive for depression. Negative for suicidal ideas.  All other systems reviewed and are negative.   Blood pressure 102/65, pulse 76, temperature 98 F (36.7 C), temperature source Oral, resp. rate 16, SpO2 97 %.There is no weight on file to calculate BMI.  General Appearance: Casual  Eye Contact::  Poor  Speech:  Clear and Coherent and Normal Rate  Volume:  Normal  Mood:  Anxious and Irritable  Affect:  Appropriate and Congruent  Thought Process:  Coherent, Goal Directed and Intact  Orientation:  Full (Time, Place, and Person)  Thought Content:  WDL  Suicidal Thoughts:  No  Homicidal Thoughts:  No  Memory:  Immediate;   Good Recent;   Good Remote;   Good  Judgement:  Impaired  Insight:  Shallow  Psychomotor Activity:  Normal  Concentration:  Fair  Recall:  NA  Fund of Knowledge:Fair  Language: Good  Akathisia:  NA  Handed:  Right  AIMS (if indicated):     Assets:  Desire for Improvement  ADL's:  Intact  Cognition: WNL  Sleep:      Medical Decision Making: Established Problem, Stable/Improving (1), Established Problem, Worsening (2), Review of Medication Regimen & Side Effects (2) and Review of New Medication or Change in Dosage (2)  Treatment Plan Summary: Major depressive disorder, single episode, improving, discharge home  Plan:  Resume all Medical medications till cleared medically.  Disposition:  -Discharge home. -Follow-up with outpatient psychiatry/counseling  Benjamine Mola, FNP-BC 05/02/2015 11:28 AM  Patient seen face-to-face for psychiatric evaluation, chart reviewed and case discussed with the physician extender and developed treatment plan. Reviewed the information documented and agree with the treatment plan. Corena Pilgrim, MD

## 2015-05-02 NOTE — ED Notes (Signed)
Pt ambulating w/o difficulty - reports feeling much better. Discussed plan of care w/ pt.

## 2015-05-12 ENCOUNTER — Encounter: Payer: Self-pay | Admitting: *Deleted

## 2015-05-12 NOTE — Progress Notes (Signed)
Oncology Nurse Navigator Documentation  Oncology Nurse Navigator Flowsheets 05/12/2015  Referral date to RadOnc/MedOnc 05/12/2015  Navigator Encounter Type Other/Recieved referral on Ronald [name] today.  Looking through records, note from 03/2014,  patient does not need to be follow up with by Dr. Julien Nordmann.  Per CXR, showed left pleural effusion due to CHF.  I updated HIM dept that patient does not need to be seen at this time.    Time Spent with Patient 30

## 2015-07-03 ENCOUNTER — Emergency Department (HOSPITAL_COMMUNITY): Payer: Medicare Other

## 2015-07-03 ENCOUNTER — Inpatient Hospital Stay (HOSPITAL_COMMUNITY)
Admission: EM | Admit: 2015-07-03 | Discharge: 2015-07-06 | DRG: 190 | Disposition: A | Discharge status: Home / Self Care | Payer: Medicare Other | Attending: Internal Medicine | Admitting: Internal Medicine

## 2015-07-03 ENCOUNTER — Encounter (HOSPITAL_COMMUNITY): Payer: Self-pay | Admitting: *Deleted

## 2015-07-03 DIAGNOSIS — I5022 Chronic systolic (congestive) heart failure: Secondary | ICD-10-CM | POA: Diagnosis present

## 2015-07-03 DIAGNOSIS — C801 Malignant (primary) neoplasm, unspecified: Secondary | ICD-10-CM | POA: Diagnosis not present

## 2015-07-03 DIAGNOSIS — Z85118 Personal history of other malignant neoplasm of bronchus and lung: Secondary | ICD-10-CM

## 2015-07-03 DIAGNOSIS — Z902 Acquired absence of lung [part of]: Secondary | ICD-10-CM | POA: Diagnosis present

## 2015-07-03 DIAGNOSIS — I255 Ischemic cardiomyopathy: Secondary | ICD-10-CM | POA: Diagnosis present

## 2015-07-03 DIAGNOSIS — J441 Chronic obstructive pulmonary disease with (acute) exacerbation: Secondary | ICD-10-CM | POA: Diagnosis not present

## 2015-07-03 DIAGNOSIS — F1410 Cocaine abuse, uncomplicated: Secondary | ICD-10-CM | POA: Diagnosis present

## 2015-07-03 DIAGNOSIS — F17210 Nicotine dependence, cigarettes, uncomplicated: Secondary | ICD-10-CM | POA: Diagnosis present

## 2015-07-03 DIAGNOSIS — J9601 Acute respiratory failure with hypoxia: Secondary | ICD-10-CM

## 2015-07-03 DIAGNOSIS — Z79899 Other long term (current) drug therapy: Secondary | ICD-10-CM

## 2015-07-03 DIAGNOSIS — Z7952 Long term (current) use of systemic steroids: Secondary | ICD-10-CM

## 2015-07-03 DIAGNOSIS — Z720 Tobacco use: Secondary | ICD-10-CM | POA: Diagnosis present

## 2015-07-03 DIAGNOSIS — I10 Essential (primary) hypertension: Secondary | ICD-10-CM | POA: Diagnosis present

## 2015-07-03 DIAGNOSIS — Z7951 Long term (current) use of inhaled steroids: Secondary | ICD-10-CM

## 2015-07-03 DIAGNOSIS — I340 Nonrheumatic mitral (valve) insufficiency: Secondary | ICD-10-CM | POA: Diagnosis present

## 2015-07-03 DIAGNOSIS — I252 Old myocardial infarction: Secondary | ICD-10-CM

## 2015-07-03 DIAGNOSIS — Z880 Allergy status to penicillin: Secondary | ICD-10-CM

## 2015-07-03 DIAGNOSIS — I2510 Atherosclerotic heart disease of native coronary artery without angina pectoris: Secondary | ICD-10-CM | POA: Diagnosis present

## 2015-07-03 DIAGNOSIS — R300 Dysuria: Secondary | ICD-10-CM | POA: Diagnosis present

## 2015-07-03 DIAGNOSIS — Z9861 Coronary angioplasty status: Secondary | ICD-10-CM

## 2015-07-03 DIAGNOSIS — I5042 Chronic combined systolic (congestive) and diastolic (congestive) heart failure: Secondary | ICD-10-CM | POA: Diagnosis present

## 2015-07-03 LAB — CBC
HEMATOCRIT: 37.2 % — AB (ref 39.0–52.0)
Hemoglobin: 13 g/dL (ref 13.0–17.0)
MCH: 29.7 pg (ref 26.0–34.0)
MCHC: 34.9 g/dL (ref 30.0–36.0)
MCV: 85.1 fL (ref 78.0–100.0)
Platelets: 285 10*3/uL (ref 150–400)
RBC: 4.37 MIL/uL (ref 4.22–5.81)
RDW: 15.1 % (ref 11.5–15.5)
WBC: 6.9 10*3/uL (ref 4.0–10.5)

## 2015-07-03 LAB — BASIC METABOLIC PANEL
Anion gap: 9 (ref 5–15)
BUN: 14 mg/dL (ref 6–20)
CALCIUM: 8.9 mg/dL (ref 8.9–10.3)
CO2: 21 mmol/L — ABNORMAL LOW (ref 22–32)
Chloride: 107 mmol/L (ref 101–111)
Creatinine, Ser: 1.07 mg/dL (ref 0.61–1.24)
GFR calc Af Amer: >60 mL/min (ref >60)
Glucose, Bld: 116 mg/dL — ABNORMAL HIGH (ref 65–99)
Potassium: 3.5 mmol/L (ref 3.5–5.1)
SODIUM: 137 mmol/L (ref 135–145)

## 2015-07-03 LAB — BRAIN NATRIURETIC PEPTIDE: B NATRIURETIC PEPTIDE 5: 212.6 pg/mL — AB (ref 0.0–100.0)

## 2015-07-03 MED ORDER — ALBUTEROL (5 MG/ML) CONTINUOUS INHALATION SOLN
10.0000 mg/h | INHALATION_SOLUTION | RESPIRATORY_TRACT | Status: DC
  Administered 2015-07-03: 10 mg/h via RESPIRATORY_TRACT
  Filled 2015-07-03 (×2): qty 20

## 2015-07-03 MED ORDER — ALBUTEROL SULFATE (2.5 MG/3ML) 0.083% IN NEBU: 5.0000 mg | INHALATION_SOLUTION | Freq: Once | RESPIRATORY_TRACT | Status: DC

## 2015-07-03 MED ORDER — METHYLPREDNISOLONE SODIUM SUCC 125 MG IJ SOLR
125.0000 mg | Freq: Once | INTRAMUSCULAR | Status: AC
  Administered 2015-07-03: 125 mg via INTRAMUSCULAR
  Filled 2015-07-03: qty 2

## 2015-07-03 NOTE — ED Notes (Signed)
Pt c/o shortness of breath and COPD exacerbation for 8 days. Pt denies using a inhaler at home. Pt states shortness of breath increases with exertion and lying down. Respirations are equal and unlabored. Pt also presents with a productive cough.

## 2015-07-03 NOTE — ED Provider Notes (Signed)
CSN: [id number]     Arrival date & time 07/03/15  2033 History  This chart was scribed for Julianne Rice, MD by Randa Evens, ED Scribe. This patient was seen in room D33C/D33C and the patient's care was started at 11:06 PM.      Chief Complaint  Patient presents with  . Shortness of Breath  . COPD  . Cough   HPI HPI Comments: ABHISHEK LEVESQUE is a 59 y.o. male who presents to the Emergency Department complaining of SOB onset 8 days prior. Pt reports associated productive cough of white sputum, fever (max temp 100.7) and wheezing. Pt states that he has a Hx of left Pneumonectomy and that when he lays on his left side the cough is worse. Pt doesn't report any medications PTA. Pt doesn't report any other symptoms. No lower extremity swelling. No chest pain.   Past Medical History  Diagnosis Date  . COPD (chronic obstructive pulmonary disease)   . CHF (congestive heart failure)   . lung ca dx'd 2005    chemo/xrt comp 2005, lung ca  . Coronary artery disease   . Myocardial infarction   . Hypertension   . Shortness of breath    Past Surgical History  Procedure Laterality Date  . Coronary angioplasty with stent placement    . Pneumonectomy  2005    left  . Left heart catheterization with coronary angiogram N/A 04/07/2012    Procedure: LEFT HEART CATHETERIZATION WITH CORONARY ANGIOGRAM;  Surgeon: [name], MD;  Location: [facility] CATH LAB;  Service: Cardiovascular;  Laterality: N/A;   Family History  Problem Relation Age of Onset  . Hypertension Other   . Diabetes Other    Social History  Substance Use Topics  . Smoking status: Former Smoker    Types: Cigarettes    Quit date: 09/30/2013  . Smokeless tobacco: Never Used  . Alcohol Use: No     Comment: former    Review of Systems  Constitutional: Positive for fever and chills.  Respiratory: Positive for cough, shortness of breath and wheezing.   Cardiovascular: Negative for chest pain, palpitations and leg swelling.   Gastrointestinal: Negative for nausea, vomiting and abdominal pain.  Musculoskeletal: Negative for back pain and neck stiffness.  Skin: Negative for rash and wound.  Neurological: Negative for dizziness, weakness, light-headedness, numbness and headaches.  All other systems reviewed and are negative.     Allergies  Penicillins  Home Medications   Prior to Admission medications   Medication Sig Start Date End Date Taking? Authorizing Provider  albuterol (PROVENTIL HFA;VENTOLIN HFA) 108 (90 BASE) MCG/ACT inhaler Inhale 2 puffs into the lungs every 4 (four) hours as needed for wheezing or shortness of breath. 10/14/13   Kristen N Ward, DO  carvedilol (COREG) 3.125 MG tablet Take 1 tablet (3.125 mg total) by mouth 2 (two) times daily with a meal. 05/02/15   Benjamine Mola, FNP  furosemide (LASIX) 40 MG tablet Take 1 tablet (40 mg total) by mouth 2 (two) times daily. Twice a day for 3 days, then back to normal dosing. 05/02/15   Benjamine Mola, FNP  montelukast (SINGULAIR) 10 MG tablet Take 1 tablet (10 mg total) by mouth at bedtime. 05/02/15   Benjamine Mola, FNP  nicotine (NICODERM CQ - DOSED IN MG/24 HOURS) 21 mg/24hr patch Place 1 patch (21 mg total) onto the skin daily as needed (Nicotine Craving). 05/02/15   Benjamine Mola, FNP  nitroGLYCERIN (NITROSTAT) 0.4 MG SL tablet Place  1 tablet (0.4 mg total) under the tongue every 5 (five) minutes x 3 doses as needed for chest pain. 11/01/13   [name], MD  prasugrel (EFFIENT) 10 MG TABS tablet Take 1 tablet (10 mg total) by mouth daily. 05/02/15   Benjamine Mola, FNP  predniSONE (DELTASONE) 20 MG tablet Take 1 tablet (20 mg total) by mouth 2 (two) times daily with a meal. 05/02/15   Benjamine Mola, FNP  ramipril (ALTACE) 1.25 MG capsule Take 1 capsule (1.25 mg total) by mouth daily. 05/02/15 10/29/16  Benjamine Mola, FNP  spironolactone (ALDACTONE) 25 MG tablet Take 1 tablet (25 mg total) by mouth daily. 05/02/15   Elyse Jarvis Withrow, FNP   BP 101/62  mmHg  Pulse 89  Temp(Src) 98 F (36.7 C) (Oral)  Resp 16  Ht 5' (1.524 m)  Wt 139 lb (63.05 kg)  BMI 27.15 kg/m2  SpO2 100%   Physical Exam  Constitutional: He is oriented to person, place, and time. He appears well-developed and well-nourished. No distress.  HENT:  Head: Normocephalic and atraumatic.  Mouth/Throat: Oropharynx is clear and moist.  Eyes: EOM are normal. Pupils are equal, round, and reactive to light.  Neck: Normal range of motion. Neck supple.  Cardiovascular: Normal rate and regular rhythm.   Pulmonary/Chest: Effort normal. No respiratory distress. He has wheezes. He has rales. He exhibits no tenderness.  Diffuse rhonchi and expiratory wheezing throughout though diminished in the left lung field. Patient is in no respiratory distress.  Abdominal: Soft. Bowel sounds are normal. He exhibits no distension and no mass. There is no tenderness. There is no rebound and no guarding.  Musculoskeletal: Normal range of motion. He exhibits no edema or tenderness.  No calf swelling or tenderness.  Neurological: He is alert and oriented to person, place, and time.  Moves all extremities without deficit. Sensation is grossly intact.  Skin: Skin is warm and dry. No rash noted. No erythema.  Psychiatric: He has a normal mood and affect. His behavior is normal.  Nursing note and vitals reviewed.   ED Course  Procedures (including critical care time) DIAGNOSTIC STUDIES: Oxygen Saturation is 97% on RA, normal by my interpretation.    COORDINATION OF CARE: 11:27 PM-Discussed treatment plan with pt at bedside and pt agreed to plan.     Labs Review Labs Reviewed  BASIC METABOLIC PANEL - Abnormal; Notable for the following:    CO2 21 (*)    Glucose, Bld 116 (*)    All other components within normal limits  CBC - Abnormal; Notable for the following:    HCT 37.2 (*)    All other components within normal limits  BRAIN NATRIURETIC PEPTIDE - Abnormal; Notable for the following:     B Natriuretic Peptide 212.6 (*)    All other components within normal limits    Imaging Review Dg Chest 2 View  07/04/2015   CLINICAL DATA:  Shortness of breath  EXAM: CHEST  2 VIEW  COMPARISON:  05/01/2015  FINDINGS: Stable appearance of left limit pneumonectomy changes. The heart and upper mediastinum is distorted from surgery and volume loss, grossly stable. Coronary stents noted. Hyperinflated right lung shows interstitial coarsening with some Kerley lines. Interstitial opacities improved from 05/01/2015 when there was definite pulmonary edema. Coarsening is greater than 02/26/2015.  IMPRESSION: 1. Interstitial opacities in the right lung is likely combination of chronic bronchitic changes and vascular congestion. 2. Left pneumonectomy.   Electronically Signed   By: Monte Fantasia  M.D.   On: 07/04/2015 00:06   I have personally reviewed and evaluated these images and lab results as part of my medical decision-making.   EKG Interpretation   Date/Time:  Thursday July 03 2015 20:49:38 EDT Ventricular Rate:  94 PR Interval:  152 QRS Duration: 126 QT Interval:  398 QTC Calculation: 497 R Axis:   -4 Text Interpretation:  Normal sinus rhythm Non-specific intra-ventricular  conduction block Cannot rule out Septal infarct , age undetermined T wave  abnormality, consider lateral ischemia Abnormal ECG Confirmed by Lita Mains   MD, Meng Winterton ([id number]) on 07/03/2015 11:04:27 PM      MDM   Final diagnoses:  COPD exacerbation      I personally performed the services described in this documentation, which was scribed in my presence. The recorded information has been reviewed and is accurate.   Patient with persistent wheezing and shortness of breath despite breathing treatment 2 and Solumedrol.  X-ray with interstitial opacities. Given infectious symptoms we'll start on antibiotics and admit to [facility].  Discussed with hospitalist and will admit to telemetry bed.  Julianne Rice, MD 07/04/15 [phone]

## 2015-07-03 NOTE — ED Notes (Signed)
MD at bedside. 

## 2015-07-04 ENCOUNTER — Encounter (HOSPITAL_COMMUNITY): Payer: Self-pay | Admitting: General Practice

## 2015-07-04 DIAGNOSIS — Z79899 Other long term (current) drug therapy: Secondary | ICD-10-CM | POA: Diagnosis not present

## 2015-07-04 DIAGNOSIS — I10 Essential (primary) hypertension: Secondary | ICD-10-CM | POA: Diagnosis not present

## 2015-07-04 DIAGNOSIS — Z9861 Coronary angioplasty status: Secondary | ICD-10-CM | POA: Diagnosis not present

## 2015-07-04 DIAGNOSIS — I5042 Chronic combined systolic (congestive) and diastolic (congestive) heart failure: Secondary | ICD-10-CM | POA: Diagnosis present

## 2015-07-04 DIAGNOSIS — Z7952 Long term (current) use of systemic steroids: Secondary | ICD-10-CM | POA: Diagnosis not present

## 2015-07-04 DIAGNOSIS — I252 Old myocardial infarction: Secondary | ICD-10-CM | POA: Diagnosis not present

## 2015-07-04 DIAGNOSIS — Z7951 Long term (current) use of inhaled steroids: Secondary | ICD-10-CM | POA: Diagnosis not present

## 2015-07-04 DIAGNOSIS — Z720 Tobacco use: Secondary | ICD-10-CM

## 2015-07-04 DIAGNOSIS — J441 Chronic obstructive pulmonary disease with (acute) exacerbation: Secondary | ICD-10-CM | POA: Diagnosis present

## 2015-07-04 DIAGNOSIS — C801 Malignant (primary) neoplasm, unspecified: Secondary | ICD-10-CM | POA: Insufficient documentation

## 2015-07-04 DIAGNOSIS — I5022 Chronic systolic (congestive) heart failure: Secondary | ICD-10-CM | POA: Diagnosis not present

## 2015-07-04 DIAGNOSIS — I255 Ischemic cardiomyopathy: Secondary | ICD-10-CM | POA: Diagnosis present

## 2015-07-04 DIAGNOSIS — I2510 Atherosclerotic heart disease of native coronary artery without angina pectoris: Secondary | ICD-10-CM

## 2015-07-04 DIAGNOSIS — J9601 Acute respiratory failure with hypoxia: Secondary | ICD-10-CM | POA: Diagnosis not present

## 2015-07-04 DIAGNOSIS — F17210 Nicotine dependence, cigarettes, uncomplicated: Secondary | ICD-10-CM | POA: Diagnosis present

## 2015-07-04 DIAGNOSIS — Z902 Acquired absence of lung [part of]: Secondary | ICD-10-CM | POA: Diagnosis present

## 2015-07-04 DIAGNOSIS — Z880 Allergy status to penicillin: Secondary | ICD-10-CM | POA: Diagnosis not present

## 2015-07-04 DIAGNOSIS — Z85118 Personal history of other malignant neoplasm of bronchus and lung: Secondary | ICD-10-CM | POA: Diagnosis not present

## 2015-07-04 DIAGNOSIS — R300 Dysuria: Secondary | ICD-10-CM | POA: Diagnosis present

## 2015-07-04 LAB — PROTIME-INR
INR: 1.06 (ref 0.00–1.49)
PROTHROMBIN TIME: 14 s (ref 11.6–15.2)

## 2015-07-04 LAB — URINALYSIS, ROUTINE W REFLEX MICROSCOPIC
Bilirubin Urine: NEGATIVE
GLUCOSE, UA: NEGATIVE mg/dL
HGB URINE DIPSTICK: NEGATIVE
Ketones, ur: NEGATIVE mg/dL
LEUKOCYTES UA: NEGATIVE
Nitrite: NEGATIVE
PH: 5 (ref 5.0–8.0)
Protein, ur: NEGATIVE mg/dL
Specific Gravity, Urine: 1.025 (ref 1.005–1.030)
Urobilinogen, UA: 0.2 mg/dL (ref 0.0–1.0)

## 2015-07-04 LAB — RAPID URINE DRUG SCREEN, HOSP PERFORMED
AMPHETAMINES: NOT DETECTED
BENZODIAZEPINES: NOT DETECTED
Barbiturates: NOT DETECTED
Cocaine: NOT DETECTED
OPIATES: POSITIVE — AB
TETRAHYDROCANNABINOL: NOT DETECTED

## 2015-07-04 LAB — INFLUENZA PANEL BY PCR (TYPE A & B)
H1N1FLUPCR: NOT DETECTED
Influenza A By PCR: NEGATIVE
Influenza B By PCR: NEGATIVE

## 2015-07-04 LAB — APTT: APTT: 36 s (ref 24–37)

## 2015-07-04 LAB — HIV ANTIBODY (ROUTINE TESTING W REFLEX): HIV SCREEN 4TH GENERATION: NONREACTIVE

## 2015-07-04 LAB — STREP PNEUMONIAE URINARY ANTIGEN: STREP PNEUMO URINARY ANTIGEN: NEGATIVE

## 2015-07-04 MED ORDER — METHYLPREDNISOLONE SODIUM SUCC 125 MG IJ SOLR
60.0000 mg | Freq: Three times a day (TID) | INTRAMUSCULAR | Status: AC
  Administered 2015-07-04 – 2015-07-05 (×6): 60 mg via INTRAVENOUS
  Filled 2015-07-04: qty 0.96
  Filled 2015-07-04 (×2): qty 2
  Filled 2015-07-04: qty 0.96
  Filled 2015-07-04: qty 2
  Filled 2015-07-04 (×2): qty 0.96
  Filled 2015-07-04: qty 2
  Filled 2015-07-04 (×2): qty 0.96

## 2015-07-04 MED ORDER — PRASUGREL HCL 10 MG PO TABS
10.0000 mg | ORAL_TABLET | Freq: Every day | ORAL | Status: DC
  Administered 2015-07-04 – 2015-07-06 (×3): 10 mg via ORAL
  Filled 2015-07-04 (×3): qty 1

## 2015-07-04 MED ORDER — RAMIPRIL 1.25 MG PO CAPS
1.2500 mg | ORAL_CAPSULE | Freq: Every day | ORAL | Status: DC
  Administered 2015-07-04 – 2015-07-06 (×3): 1.25 mg via ORAL
  Filled 2015-07-04 (×3): qty 1

## 2015-07-04 MED ORDER — FUROSEMIDE 20 MG PO TABS
20.0000 mg | ORAL_TABLET | Freq: Every day | ORAL | Status: DC
  Administered 2015-07-04 – 2015-07-06 (×3): 20 mg via ORAL
  Filled 2015-07-04 (×3): qty 1

## 2015-07-04 MED ORDER — IPRATROPIUM-ALBUTEROL 0.5-2.5 (3) MG/3ML IN SOLN
3.0000 mL | Freq: Four times a day (QID) | RESPIRATORY_TRACT | Status: DC
  Administered 2015-07-04 (×2): 3 mL via RESPIRATORY_TRACT
  Filled 2015-07-04 (×6): qty 3

## 2015-07-04 MED ORDER — MONTELUKAST SODIUM 10 MG PO TABS
10.0000 mg | ORAL_TABLET | Freq: Every day | ORAL | Status: DC
  Administered 2015-07-04 – 2015-07-05 (×3): 10 mg via ORAL
  Filled 2015-07-04 (×6): qty 1

## 2015-07-04 MED ORDER — ALBUTEROL SULFATE (2.5 MG/3ML) 0.083% IN NEBU: 2.5000 mg | INHALATION_SOLUTION | RESPIRATORY_TRACT | Status: DC | PRN

## 2015-07-04 MED ORDER — SPIRONOLACTONE 25 MG PO TABS
25.0000 mg | ORAL_TABLET | Freq: Every day | ORAL | Status: DC
  Administered 2015-07-04 – 2015-07-06 (×3): 25 mg via ORAL
  Filled 2015-07-04 (×3): qty 1

## 2015-07-04 MED ORDER — CARVEDILOL 3.125 MG PO TABS
3.1250 mg | ORAL_TABLET | Freq: Two times a day (BID) | ORAL | Status: DC
  Administered 2015-07-05 – 2015-07-06 (×3): 3.125 mg via ORAL
  Filled 2015-07-04 (×4): qty 1

## 2015-07-04 MED ORDER — LEVOFLOXACIN 750 MG PO TABS
750.0000 mg | ORAL_TABLET | Freq: Once | ORAL | Status: AC
  Administered 2015-07-04: 750 mg via ORAL
  Filled 2015-07-04: qty 1

## 2015-07-04 MED ORDER — IPRATROPIUM-ALBUTEROL 0.5-2.5 (3) MG/3ML IN SOLN
3.0000 mL | RESPIRATORY_TRACT | Status: DC
  Administered 2015-07-04 (×2): 3 mL via RESPIRATORY_TRACT
  Filled 2015-07-04 (×2): qty 3

## 2015-07-04 MED ORDER — NITROGLYCERIN 0.4 MG SL SUBL: 0.4000 mg | SUBLINGUAL_TABLET | SUBLINGUAL | Status: DC | PRN

## 2015-07-04 MED ORDER — LEVOFLOXACIN 750 MG PO TABS
750.0000 mg | ORAL_TABLET | Freq: Every day | ORAL | Status: DC
  Administered 2015-07-04 – 2015-07-05 (×2): 750 mg via ORAL
  Filled 2015-07-04 (×5): qty 1

## 2015-07-04 MED ORDER — IPRATROPIUM-ALBUTEROL 0.5-2.5 (3) MG/3ML IN SOLN
3.0000 mL | Freq: Three times a day (TID) | RESPIRATORY_TRACT | Status: DC
  Administered 2015-07-05 – 2015-07-06 (×4): 3 mL via RESPIRATORY_TRACT
  Filled 2015-07-04 (×4): qty 3

## 2015-07-04 MED ORDER — CARVEDILOL 3.125 MG PO TABS
3.1250 mg | ORAL_TABLET | Freq: Two times a day (BID) | ORAL | Status: DC
  Administered 2015-07-04 (×2): 3.125 mg via ORAL
  Filled 2015-07-04 (×2): qty 1

## 2015-07-04 MED ORDER — DM-GUAIFENESIN ER 30-600 MG PO TB12
1.0000 | ORAL_TABLET | Freq: Two times a day (BID) | ORAL | Status: DC
  Administered 2015-07-04 (×2): 1 via ORAL
  Filled 2015-07-04 (×3): qty 1

## 2015-07-04 MED ORDER — NICOTINE 21 MG/24HR TD PT24
21.0000 mg | MEDICATED_PATCH | Freq: Every day | TRANSDERMAL | Status: DC | PRN
  Administered 2015-07-04: 21 mg via TRANSDERMAL
  Filled 2015-07-04: qty 1

## 2015-07-04 MED ORDER — GUAIFENESIN ER 600 MG PO TB12
600.0000 mg | ORAL_TABLET | Freq: Two times a day (BID) | ORAL | Status: DC
  Administered 2015-07-04 – 2015-07-06 (×4): 600 mg via ORAL
  Filled 2015-07-04 (×6): qty 1

## 2015-07-04 MED ORDER — ENOXAPARIN SODIUM 40 MG/0.4ML ~~LOC~~ SOLN
40.0000 mg | Freq: Every day | SUBCUTANEOUS | Status: DC
  Administered 2015-07-04 – 2015-07-06 (×3): 40 mg via SUBCUTANEOUS
  Filled 2015-07-04 (×3): qty 0.4

## 2015-07-04 MED ORDER — MENTHOL 3 MG MT LOZG
1.0000 | LOZENGE | OROMUCOSAL | Status: DC | PRN
  Administered 2015-07-05: 3 mg via ORAL
  Filled 2015-07-04: qty 9

## 2015-07-04 NOTE — Progress Notes (Signed)
OT Cancellation Note  Patient Details Name: Ronald [name] MRN: [id number] DOB: [birth year]   Cancelled Treatment:    Reason Eval/Treat Not Completed: OT screened, no needs identified, will sign off. PT addressed energy conservation and recommended pt consider a shower seat.   Malka So 07/04/2015, 12:26 PM  [phone]

## 2015-07-04 NOTE — Evaluation (Signed)
Physical Therapy Evaluation/ Discharge Patient Details Name: Ronald [name] MRN: [id number] DOB: Mar 30, 1956 Today's Date: 07/04/2015   History of Present Illness  59 y.o. male with PMH of hypertension, COPD, systolic congestive heart failure (EF 30-35%), lung cancer (s/p of chemotherapy therapy, radiation therapy, left pneumonectomy), who presents with productive cough, shortness of breath and subjective fever  Clinical Impression  Pt pleasant and moving well. Pt states he normally can only walk to the stop sign but gets SOB easily. Pt sats 91-95% on RA throughout gait with dyspnea 2/4. Pt with cues for breathing technique, frequent rest breaks, energy conservation and energy conservation handout provided. Pt states he gets Sob with bathing with education for seat and cooler water to increase tolerance. Pt stating his greatest concern is having the breath and energy for sex. Pt at baseline mobility with all education complete. No further therapy needs and recommend daily ambulation acutely with nursing.     Follow Up Recommendations No PT follow up    Equipment Recommendations  Other (comment) (shower seat)    Recommendations for Other Services       Precautions / Restrictions Precautions Precautions: None      Mobility  Bed Mobility                  Transfers Overall transfer level: Independent                  Ambulation/Gait Ambulation/Gait assistance: Independent Ambulation Distance (Feet): 300 Feet Assistive device: None Gait Pattern/deviations: WFL(Within Functional Limits)   Gait velocity interpretation: Below normal speed for age/gender    Stairs            Wheelchair Mobility    Modified Rankin (Stroke Patients Only)       Balance Overall balance assessment: No apparent balance deficits (not formally assessed)                                           Pertinent Vitals/Pain Pain Assessment: No/denies pain     Home Living Family/patient expects to be discharged to:: Private residence Living Arrangements: Children Available Help at Discharge: Family;Available PRN/intermittently Type of Home: Apartment Home Access: Level entry     Home Layout: One level Home Equipment: None      Prior Function Level of Independence: Needs assistance   Gait / Transfers Assistance Needed: pt independent with gait and mobility  ADL's / Homemaking Assistance Needed: son does the housework and cooking        Journalist, newspaper        Extremity/Trunk Assessment   Upper Extremity Assessment: Overall WFL for tasks assessed           Lower Extremity Assessment: Overall WFL for tasks assessed      Cervical / Trunk Assessment: Normal  Communication   Communication: No difficulties  Cognition Arousal/Alertness: Awake/alert Behavior During Therapy: WFL for tasks assessed/performed Overall Cognitive Status: Within Functional Limits for tasks assessed                      General Comments      Exercises        Assessment/Plan    PT Assessment Patent does not need any further PT services  PT Diagnosis Difficulty walking   PT Problem List    PT Treatment Interventions     PT Goals (Current goals can  be found in the Care Plan section) Acute Rehab PT Goals Patient Stated Goal: "be able to have sex" PT Goal Formulation: All assessment and education complete, DC therapy    Frequency     Barriers to discharge        Co-evaluation               End of Session   Activity Tolerance: Patient tolerated treatment well Patient left: in chair;with call bell/phone within reach Nurse Communication: Mobility status         Time: 9532-0233 PT Time Calculation (min) (ACUTE ONLY): 18 min   Charges:   PT Evaluation $Initial PT Evaluation Tier I: 1 Procedure     PT G CodesMelford Aase 07/04/2015, 11:29 AM [name], [name]

## 2015-07-04 NOTE — H&P (Signed)
[facility] History and Physical  TEDRICK PORT MBW:[id number] DOB: Apr 14, 1956 DOA: 07/03/2015  Referring physician: ED physician PCP: Philis Fendt, MD  Specialists:   Chief Complaint:  Productive cough, shortness of breath and a subjective fever.  HPI: Ronald [name] is a 59 y.o. male with PMH of hypertension, COPD, systolic congestive heart failure (EF 30-35%), lung cancer (s/p of chemotherapy therapy, radiation therapy, left pneumonectomy), who presents with productive cough, shortness of breath and subjective fever.  Pt reports that in the past days, he has been having cough, fever and shortness of breath. He coughs up a lot of white sputum. He reports subjective fever with temperature 100.7 at one time point. He does not have chest pain currently. He states that he stopped taking his Lasix for CHF in the past 3 days because taking Lasix makes his blood pressure drop. He has gained 2 pounds in the past 3 days, but no leg edema. He states that he has mild burning on urination, but no dysuria or increased urinary frequency. Patient does not have abdominal pain, nausea, vomiting, diarrhea, rashes, unilateral weakness.  In ED, patient was found to have WBC 6.7, temperature normal, no tachycardia, electrolytes okay, BNP 212.6. CXR showed interstitial opacities in the right lung is likely combination of chronic bronchitic changes and vascular congestion and left pneumonectomy.  Where does patient live?   At home    Can patient participate in ADLs?   Some   Review of Systems:   General: had subjective fever fevers, chills, body weight gain, has poor appetite, has fatigue HEENT: no blurry vision, hearing changes or sore throat Pulm: has dyspnea, coughing, wheezing CV: no chest pain, palpitations Abd: no nausea, vomiting, abdominal pain, diarrhea, constipation GU: no dysuria, has mild burning on urination, no increased urinary frequency, hematuria  Ext: no leg edema Neuro: no  unilateral weakness, numbness, or tingling, no vision change or hearing loss Skin: no rash MSK: No muscle spasm, no deformity, no limitation of range of movement in spin Heme: No easy bruising.  Travel history: No recent long distant travel.  Allergy:  Allergies  Allergen Reactions  . Penicillins Itching    Past Medical History  Diagnosis Date  . COPD (chronic obstructive pulmonary disease)   . CHF (congestive heart failure)   . lung ca dx'd 2005    chemo/xrt comp 2005, lung ca  . Coronary artery disease   . Myocardial infarction   . Hypertension   . Shortness of breath     Past Surgical History  Procedure Laterality Date  . Coronary angioplasty with stent placement    . Pneumonectomy  2005    left  . Left heart catheterization with coronary angiogram N/A 04/07/2012    Procedure: LEFT HEART CATHETERIZATION WITH CORONARY ANGIOGRAM;  Surgeon: [name], MD;  Location: [facility] CATH LAB;  Service: Cardiovascular;  Laterality: N/A;    Social History:  reports that he quit smoking about 21 months ago. His smoking use included Cigarettes. He has never used smokeless tobacco. He reports that he uses illicit drugs (Cocaine). He reports that he does not drink alcohol.  Family History:  Family History  Problem Relation Age of Onset  . Hypertension Other   . Diabetes Other      Prior to Admission medications   Medication Sig Start Date End Date Taking? Authorizing Provider  albuterol (PROVENTIL HFA;VENTOLIN HFA) 108 (90 BASE) MCG/ACT inhaler Inhale 2 puffs into the lungs every 4 (four) hours as needed for wheezing  or shortness of breath. 10/14/13   Kristen N Ward, DO  carvedilol (COREG) 3.125 MG tablet Take 1 tablet (3.125 mg total) by mouth 2 (two) times daily with a meal. 05/02/15   Benjamine Mola, FNP  furosemide (LASIX) 40 MG tablet Take 1 tablet (40 mg total) by mouth 2 (two) times daily. Twice a day for 3 days, then back to normal dosing. 05/02/15   Benjamine Mola, FNP   montelukast (SINGULAIR) 10 MG tablet Take 1 tablet (10 mg total) by mouth at bedtime. 05/02/15   Benjamine Mola, FNP  nicotine (NICODERM CQ - DOSED IN MG/24 HOURS) 21 mg/24hr patch Place 1 patch (21 mg total) onto the skin daily as needed (Nicotine Craving). 05/02/15   Benjamine Mola, FNP  nitroGLYCERIN (NITROSTAT) 0.4 MG SL tablet Place 1 tablet (0.4 mg total) under the tongue every 5 (five) minutes x 3 doses as needed for chest pain. 11/01/13   [name], MD  prasugrel (EFFIENT) 10 MG TABS tablet Take 1 tablet (10 mg total) by mouth daily. 05/02/15   Benjamine Mola, FNP  predniSONE (DELTASONE) 20 MG tablet Take 1 tablet (20 mg total) by mouth 2 (two) times daily with a meal. 05/02/15   Benjamine Mola, FNP  ramipril (ALTACE) 1.25 MG capsule Take 1 capsule (1.25 mg total) by mouth daily. 05/02/15 10/29/16  Benjamine Mola, FNP  spironolactone (ALDACTONE) 25 MG tablet Take 1 tablet (25 mg total) by mouth daily. 05/02/15   Benjamine Mola, FNP    Physical Exam: Filed Vitals:   07/03/15 2050 07/03/15 2300 07/03/15 2345 07/04/15 0015  BP: 107/64 95/58 101/62   Pulse: 88 82 84 89  Temp: 98 F (36.7 C)     TempSrc: Oral     Resp: '20 27 18 16  '$ Height: 5' (1.524 m)     Weight: 63.05 kg (139 lb)     SpO2: 97% 97% 100% 100%   General: Not in acute distress HEENT:       Eyes: PERRL, EOMI, no scleral icterus.       ENT: No discharge from the ears and nose, no pharynx injection, no tonsillar enlargement.        Neck: No JVD, no bruit, no mass felt. Heme: No neck lymph node enlargement. Cardiac: S1/S2, RRR, No murmurs, No gallops or rubs. Pulm: Diffused rhonchi and wheezing, no rales or rubs. Abd: Soft, nondistended, nontender, no rebound pain, no organomegaly, BS present. Ext: No pitting leg edema bilaterally. 2+DP/PT pulse bilaterally. Musculoskeletal: No joint deformities, No joint redness or warmth, no limitation of ROM in spin. Skin: No rashes.  Neuro: Alert, oriented X3, cranial nerves II-XII  grossly intact, muscle strength 5/5 in all extremities, sensation to light touch intact.  Psych: Patient is not psychotic, no suicidal or hemocidal ideation.  Labs on Admission:  Basic Metabolic Panel:  Recent Labs Lab 07/03/15 2111  NA 137  K 3.5  CL 107  CO2 21*  GLUCOSE 116*  BUN 14  CREATININE 1.07  CALCIUM 8.9   Liver Function Tests: No results for input(s): AST, ALT, ALKPHOS, BILITOT, PROT, ALBUMIN in the last 168 hours. No results for input(s): LIPASE, AMYLASE in the last 168 hours. No results for input(s): AMMONIA in the last 168 hours. CBC:  Recent Labs Lab 07/03/15 2111  WBC 6.9  HGB 13.0  HCT 37.2*  MCV 85.1  PLT 285   Cardiac Enzymes: No results for input(s): CKTOTAL, CKMB, CKMBINDEX, TROPONINI in the last  168 hours.  BNP (last 3 results)  Recent Labs  03/06/15 0903 05/01/15 1637 07/03/15 2112  BNP 362.4* 374.2* 212.6*    ProBNP (last 3 results) No results for input(s): PROBNP in the last 8760 hours.  CBG: No results for input(s): GLUCAP in the last 168 hours.  Radiological Exams on Admission: Dg Chest 2 View  07/04/2015   CLINICAL DATA:  Shortness of breath  EXAM: CHEST  2 VIEW  COMPARISON:  05/01/2015  FINDINGS: Stable appearance of left limit pneumonectomy changes. The heart and upper mediastinum is distorted from surgery and volume loss, grossly stable. Coronary stents noted. Hyperinflated right lung shows interstitial coarsening with some Kerley lines. Interstitial opacities improved from 05/01/2015 when there was definite pulmonary edema. Coarsening is greater than 02/26/2015.  IMPRESSION: 1. Interstitial opacities in the right lung is likely combination of chronic bronchitic changes and vascular congestion. 2. Left pneumonectomy.   Electronically Signed   By: Monte Fantasia M.D.   On: 07/04/2015 00:06    EKG: Independently reviewed.  Abnormal findings: QTC 497, LAD, bilateral atrial enlargement, widening QRS wave and T-wave inversion in  lead 1 and aVL which are similar to previous EKG on 05/31/15.   Assessment/Plan Principal Problem:   COPD exacerbation Active Problems:   CAD (coronary artery disease)   Mitral regurgitation   Cocaine abuse   Tobacco abuse   Chronic systolic CHF (congestive heart failure)   Essential hypertension  COPD exacerbation: Patient's productive cough, subjective fever, chest x-ray findings and wheezing plus rhonchi on auscultation are consistent with COPD exacerbation. Though patient stopped taking Lasix in the past 3 days, his volume status looks okay. No chest pain, less likely to have PE.  -will admit patient to telemetry bed  -Nebulizers: scheduled Duoneb and prn albuterol -Continue home singulir -Solu-Medrol 60 mg IV tid -Ed started Levaquin, will continue -Mucinex for cough  -Urine drug screen, HIV -Urine legionella and S. pneumococcal antigen -Follow up blood culture x2, sputum culture, Flu pcr  CAD (coronary artery disease): No chest pain. -continue Effient, Coreg and prn NTG  Chronic systolic CHF (congestive heart failure): 2-D Echo on 04/06/12 showed EF 30-35%. Patient was supposed to take Lasix 40 mg daily or twice a day, but he has not been taking his medications in the past 3 days. He has gained 2 pounds. No leg edema or JVD. BNP 212.6. His volume status seems to be okay.  -resume lasix at low dose, 20 mg daily -continue Spironolactone  Tobacco abuse and Alcohol abuse: -Did counseling about importance of quitting smoking -Nicotine patch  Hx of Cocaine abuse: denies drug use -check UDS  Essential hypertension: Blood pressure 95/58-101/62 -on Coreg -monitoring closely after starting lasix since pt reports that taking lasix drops his Bp.  Burning on urination: -check UA  DVT ppx: SQ Heparin Code Status: Full code Family Communication: None at bed side.  Disposition Plan: Admit to inpatient   Date of Service 07/04/2015    Ivor Costa [facility] Pager  [phone]  If 7PM-7AM, please contact night-coverage [email or web address] Password [email or web address] 07/04/2015, 1:36 AM

## 2015-07-04 NOTE — Progress Notes (Signed)
PROGRESS NOTE  VYNCENT OVERBY VHQ:[id number] DOB: 09-27-1956 DOA: 07/03/2015 PCP: Philis Fendt, MD  Brief history 59 year old male with a history of COPD, non-small cell lung cancer status post L-pneumonectomy, hypertension, systolic CHF presented with 3-4 days of creasing shortness of breath, productive cough, and fever. The patient last had a fever approximately 3 days prior to admission of 100.39F. Apparently, the patient has run out of all his medications and did not have the money to refill them. Unfortunately, he did not notify any of his physicians. The patient has changed his story with different physicians. Initially, the patient stated that he had gained 2 pounds at home in the past 3 days. However, when I inquired about his weight, he states that he normally does not weigh himself regularly. At the time of admission, he stated that he stopped taking his cardiac medications including furosemide because of "low blood pressure", but he informed me that he doesn't normally check his blood pressure at home. Workup revealed COPD exacerbation, and the patient was started on intravenous diuretics. Assessment/Plan: Acute respiratory failure with hypoxemia -Presently stable on 2 L -Secondary to COPD exacerbation -Pulmonary hygiene -Urine drug screen negative COPD exacerbation -Continue intravenous Solu-Medrol -Continue bronchodilators -Continue levofloxacin -Supplemental oxygen, wean for oxygen saturation greater than 92% Tobacco abuse  -Tobacco cessation discussed  -He has nearly 40-pack-year history  -Still smoking 2 cigarettes per day   chronic diastolic CHF -Clinically compensated -04/06/2012 echocardiogram EF 30-35% -Continue carvedilol -Continue low-dose furosemide 20 mg daily  -Patient states that he only takes furosemide 40 mg every other day at home -Continue spironolactone and Altace Dysuria  -Urinalysis without pyuria  -urine NAAT for GC and chlamydia -HIV  pending Coronary artery disease/ischemic cardiomyopathy  -Continue Effient  -continue coreg History of non-small cell lung cancer  -Status post left pneumonectomy  -Unfortunately still smoking    Family Communication:   Pt at beside Disposition Plan:   Home when medically stable       Procedures/Studies: Dg Chest 2 View  07/04/2015   CLINICAL DATA:  Shortness of breath  EXAM: CHEST  2 VIEW  COMPARISON:  05/01/2015  FINDINGS: Stable appearance of left limit pneumonectomy changes. The heart and upper mediastinum is distorted from surgery and volume loss, grossly stable. Coronary stents noted. Hyperinflated right lung shows interstitial coarsening with some Kerley lines. Interstitial opacities improved from 05/01/2015 when there was definite pulmonary edema. Coarsening is greater than 02/26/2015.  IMPRESSION: 1. Interstitial opacities in the right lung is likely combination of chronic bronchitic changes and vascular congestion. 2. Left pneumonectomy.   Electronically Signed   By: Monte Fantasia M.D.   On: 07/04/2015 00:06        Subjective:  patient is breathing better but still have significant dyspnea with minimal exertion. Denies any fevers, chills, chest pain, nausea, vomiting, diarrhea, abdominal pain, hematochezia, melena, hemoptysis. Denies any syncope.  Objective: Filed Vitals:   07/04/15 0333 07/04/15 0650 07/04/15 0736 07/04/15 1054  BP:  110/70  130/74  Pulse:  88    Temp:  97.6 F (36.4 C)    TempSrc:  Oral    Resp:  24    Height:      Weight:      SpO2: 97% 100% 98%     Intake/Output Summary (Last 24 hours) at 07/04/15 1110 Last data filed at 07/04/15 1049  Gross per 24 hour  Intake    315 ml  Output  325 ml  Net    -10 ml   Weight change:  Exam:   General:  Pt is alert, follows commands appropriately, not in acute distress  HEENT: No icterus, No thrush, No neck mass, [place]/AT  Cardiovascular: RRR, S1/S2, no rubs, no gallops; no JVD   Respiratory:  bilateral expiratory wheeze   Abdomen: Soft/+BS, non tender, non distended, no guarding  Extremities: No edema, No lymphangitis, No petechiae, No rashes, no synovitis  Data Reviewed: Basic Metabolic Panel:  Recent Labs Lab 07/03/15 2111  NA 137  K 3.5  CL 107  CO2 21*  GLUCOSE 116*  BUN 14  CREATININE 1.07  CALCIUM 8.9   Liver Function Tests: No results for input(s): AST, ALT, ALKPHOS, BILITOT, PROT, ALBUMIN in the last 168 hours. No results for input(s): LIPASE, AMYLASE in the last 168 hours. No results for input(s): AMMONIA in the last 168 hours. CBC:  Recent Labs Lab 07/03/15 2111  WBC 6.9  HGB 13.0  HCT 37.2*  MCV 85.1  PLT 285   Cardiac Enzymes: No results for input(s): CKTOTAL, CKMB, CKMBINDEX, TROPONINI in the last 168 hours. BNP: Invalid input(s): POCBNP CBG: No results for input(s): GLUCAP in the last 168 hours.  No results found for this or any previous visit (from the past 240 hour(s)).   Scheduled Meds: . albuterol  5 mg Nebulization Once  . carvedilol  3.125 mg Oral BID WC  . dextromethorphan-guaiFENesin  1 tablet Oral BID  . enoxaparin (LOVENOX) injection  40 mg Subcutaneous Daily  . furosemide  20 mg Oral Daily  . ipratropium-albuterol  3 mL Nebulization Q6H  . levofloxacin  750 mg Oral QHS  . methylPREDNISolone (SOLU-MEDROL) injection  60 mg Intravenous 3 times per day  . montelukast  10 mg Oral QHS  . prasugrel  10 mg Oral Daily  . ramipril  1.25 mg Oral Daily  . spironolactone  25 mg Oral Daily   Continuous Infusions:    Jayci Ellefson, DO  [facility] Pager [phone]  If 7PM-7AM, please contact night-coverage [email or web address] Password [email or web address] 07/04/2015, 11:10 AM   LOS: 0 days

## 2015-07-04 NOTE — Progress Notes (Signed)
Admitted pt from the ED per stretcher assisted by the NT. AO x 4, on O2 at 2L per [place], denies chest pain, denies nausea and vomiting. Oriented to room and call bell. Fall prevention Pt safety plan given and instructed with understanding. Will continue to monitor pt   07/04/15 0211  Vitals  Temp 97.8 F (36.6 C)  Temp Source Oral  BP 107/66 mmHg  BP Method Automatic  Patient Position (if appropriate) Lying  Pulse Rate 90  Resp (!) 22  Oxygen Therapy  SpO2 100 %  O2 Device Nasal Cannula  O2 Flow Rate (L/min) 2 L/min  Height and Weight  Height 5' (1.524 m)  Weight 61.009 kg (134 lb 8 oz)  Type of Scale Used Standing (Scale A)  Type of Weight Actual  BSA (Calculated - sq m) 1.61 sq meters  BMI (Calculated) 26.3  Weight in (lb) to have BMI = 25 127.7

## 2015-07-04 NOTE — Progress Notes (Signed)
CM CONSULT FOR MEDICATION NEEDS  Patient is independent of ADL's, his son lives with him and he assist him financially. Patient has Medicare and Medicaid with prescription drug coverage. Patient stated that he ran out of his medication and did not have the money to get more. CM informed the patient when that happened to notify his PCP or Cardiologist to make them aware. Also CM informed the patient to reach out to others for help. ( Medicaid prescription drug coverage is usually $3.00 or less). Patient stated " I went on vacation and just don't know what happened." Encouragement given to make his medication a priority. All questions answered. Mindi Slicker [facility] [phone]

## 2015-07-05 LAB — BASIC METABOLIC PANEL
Anion gap: 7 (ref 5–15)
BUN: 14 mg/dL (ref 6–20)
CO2: 23 mmol/L (ref 22–32)
CREATININE: 0.87 mg/dL (ref 0.61–1.24)
Calcium: 9.1 mg/dL (ref 8.9–10.3)
Chloride: 105 mmol/L (ref 101–111)
GFR calc Af Amer: >60 mL/min (ref >60)
GLUCOSE: 153 mg/dL — AB (ref 65–99)
Potassium: 4 mmol/L (ref 3.5–5.1)
SODIUM: 135 mmol/L (ref 135–145)

## 2015-07-05 MED ORDER — PREDNISONE 50 MG PO TABS: 60.0000 mg | ORAL_TABLET | Freq: Every day | ORAL | Status: DC

## 2015-07-05 MED ORDER — ZOLPIDEM TARTRATE 5 MG PO TABS
5.0000 mg | ORAL_TABLET | Freq: Once | ORAL | Status: AC
  Administered 2015-07-05: 5 mg via ORAL
  Filled 2015-07-05: qty 1

## 2015-07-05 MED ORDER — NICOTINE 14 MG/24HR TD PT24
14.0000 mg | MEDICATED_PATCH | Freq: Every day | TRANSDERMAL | Status: DC
  Administered 2015-07-05 – 2015-07-06 (×2): 14 mg via TRANSDERMAL
  Filled 2015-07-05: qty 1

## 2015-07-05 NOTE — Progress Notes (Signed)
PROGRESS NOTE  Ronald [name] MOQ:[id number] DOB: 1956-03-11 DOA: 07/03/2015 PCP: Philis Fendt, MD  Brief history 59 year old male with a history of COPD, non-small cell lung cancer status post L-pneumonectomy, hypertension, systolic CHF presented with 3-4 days of creasing shortness of breath, productive cough, and fever. The patient last had a fever approximately 3 days prior to admission of 100.23F. Apparently, the patient has run out of all his medications and did not have the money to refill them. Unfortunately, he did not notify any of his physicians. The patient has changed his story with different physicians. Initially, the patient stated that he had gained 2 pounds at home in the past 3 days. However, when I inquired about his weight, he states that he normally does not weigh himself regularly. At the time of admission, he stated that he stopped taking his cardiac medications including furosemide because of "low blood pressure", but he informed me that he doesn't normally check his blood pressure at home. Workup revealed COPD exacerbation, and the patient was started on intravenous diuretics. Assessment/Plan: Acute respiratory failure with hypoxemia -Presently stable on 2 L -Secondary to COPD exacerbation -Pulmonary hygiene -Urine drug screen negative COPD exacerbation -Continue intravenous Solu-Medrol--start weaning to po prednisone -Continue bronchodilators -Continue levofloxacin -Supplemental oxygen, wean for oxygen saturation greater than 92% Tobacco abuse  -Tobacco cessation discussed  -He has nearly 40-pack-year history  -Still smoking 2-3 cigarettes per day  chronic diastolic CHF -Clinically compensated -04/06/2012 echocardiogram EF 30-35% -Continue carvedilol -Continue low-dose furosemide 20 mg daily  -Patient states that he only takes furosemide 40 mg every other day at home -Continue spironolactone and Altace Dysuria  -Urinalysis without pyuria   -urine NAAT for GC and chlamydia -HIV neg Coronary artery disease/ischemic cardiomyopathy  -Continue Effient  -continue coreg History of non-small cell lung cancer  -Status post left pneumonectomy  -Unfortunately still smoking    Family Communication: Pt at beside Disposition Plan: Home when medically stable   Procedures/Studies: Dg Chest 2 View  07/04/2015   CLINICAL DATA:  Shortness of breath  EXAM: CHEST  2 VIEW  COMPARISON:  05/01/2015  FINDINGS: Stable appearance of left limit pneumonectomy changes. The heart and upper mediastinum is distorted from surgery and volume loss, grossly stable. Coronary stents noted. Hyperinflated right lung shows interstitial coarsening with some Kerley lines. Interstitial opacities improved from 05/01/2015 when there was definite pulmonary edema. Coarsening is greater than 02/26/2015.  IMPRESSION: 1. Interstitial opacities in the right lung is likely combination of chronic bronchitic changes and vascular congestion. 2. Left pneumonectomy.   Electronically Signed   By: Monte Fantasia M.D.   On: 07/04/2015 00:06        Subjective: Overall, the patient is doing better. He is breathing better but still having some mild dyspnea on exertion. Denies fevers, chills, chest pain, nausea, vomiting, diarrhea, abdominal pain, dysuria, hematuria.  Objective: Filed Vitals:   07/05/15 0801 07/05/15 1000 07/05/15 1300 07/05/15 1450  BP:  92/50 95/60   Pulse:  87 79   Temp:  97.9 F (36.6 C) 97.4 F (36.3 C)   TempSrc:  Oral Oral   Resp:  18 18   Height:      Weight: 61.326 kg (135 lb 3.2 oz)     SpO2: 92% 96% 98% 98%    Intake/Output Summary (Last 24 hours) at 07/05/15 1524 Last data filed at 07/05/15 1330  Gross per 24 hour  Intake    960 ml  Output   1875 ml  Net   -915 ml   Weight change:  Exam:   General:  Pt is alert, follows commands appropriately, not in acute distress  HEENT: No icterus, No thrush, No neck mass,  [place]/AT  Cardiovascular: RRR, S1/S2, no rubs, no gallops  Respiratory: Minimal basilar wheeze on the right. Good air movement.  Abdomen: Soft/+BS, non tender, non distended, no guarding  Extremities: No edema, No lymphangitis, No petechiae, No rashes, no synovitis  Data Reviewed: Basic Metabolic Panel:  Recent Labs Lab 07/03/15 2111 07/05/15 0307  NA 137 135  K 3.5 4.0  CL 107 105  CO2 21* 23  GLUCOSE 116* 153*  BUN 14 14  CREATININE 1.07 0.87  CALCIUM 8.9 9.1   Liver Function Tests: No results for input(s): AST, ALT, ALKPHOS, BILITOT, PROT, ALBUMIN in the last 168 hours. No results for input(s): LIPASE, AMYLASE in the last 168 hours. No results for input(s): AMMONIA in the last 168 hours. CBC:  Recent Labs Lab 07/03/15 2111  WBC 6.9  HGB 13.0  HCT 37.2*  MCV 85.1  PLT 285   Cardiac Enzymes: No results for input(s): CKTOTAL, CKMB, CKMBINDEX, TROPONINI in the last 168 hours. BNP: Invalid input(s): POCBNP CBG: No results for input(s): GLUCAP in the last 168 hours.  No results found for this or any previous visit (from the past 240 hour(s)).   Scheduled Meds: . albuterol  5 mg Nebulization Once  . carvedilol  3.125 mg Oral BID WC  . enoxaparin (LOVENOX) injection  40 mg Subcutaneous Daily  . furosemide  20 mg Oral Daily  . guaiFENesin  600 mg Oral BID  . ipratropium-albuterol  3 mL Nebulization TID  . levofloxacin  750 mg Oral QHS  . methylPREDNISolone (SOLU-MEDROL) injection  60 mg Intravenous 3 times per day  . montelukast  10 mg Oral QHS  . nicotine  14 mg Transdermal Daily  . prasugrel  10 mg Oral Daily  . [START ON 07/06/2015] predniSONE  60 mg Oral Q breakfast  . ramipril  1.25 mg Oral Daily  . spironolactone  25 mg Oral Daily   Continuous Infusions:    Bindi Klomp, DO  [facility] Pager [phone]  If 7PM-7AM, please contact night-coverage [email or web address] Password [email or web address] 07/05/2015, 3:24 PM   LOS: 1 day

## 2015-07-06 LAB — BASIC METABOLIC PANEL
Anion gap: 9 (ref 5–15)
BUN: 21 mg/dL — AB (ref 6–20)
CO2: 23 mmol/L (ref 22–32)
CREATININE: 1.05 mg/dL (ref 0.61–1.24)
Calcium: 8.9 mg/dL (ref 8.9–10.3)
Chloride: 102 mmol/L (ref 101–111)
GFR calc Af Amer: >60 mL/min (ref >60)
GLUCOSE: 116 mg/dL — AB (ref 65–99)
Potassium: 4.6 mmol/L (ref 3.5–5.1)
SODIUM: 134 mmol/L — AB (ref 135–145)

## 2015-07-06 MED ORDER — LEVOFLOXACIN 750 MG PO TABS: 750.0000 mg | ORAL_TABLET | Freq: Every day | ORAL | Status: DC

## 2015-07-06 MED ORDER — PRASUGREL HCL 10 MG PO TABS: 10.0000 mg | ORAL_TABLET | Freq: Every day | ORAL | Status: DC

## 2015-07-06 MED ORDER — PREDNISONE 50 MG PO TABS
60.0000 mg | ORAL_TABLET | Freq: Every day | ORAL | Status: DC
  Administered 2015-07-06: 60 mg via ORAL
  Filled 2015-07-06: qty 1

## 2015-07-06 MED ORDER — PREDNISONE 20 MG PO TABS: 60.0000 mg | ORAL_TABLET | Freq: Every day | ORAL | Status: DC

## 2015-07-06 MED ORDER — RAMIPRIL 1.25 MG PO CAPS: 1.2500 mg | ORAL_CAPSULE | Freq: Every day | ORAL | Status: DC

## 2015-07-06 MED ORDER — SPIRONOLACTONE 25 MG PO TABS: 25.0000 mg | ORAL_TABLET | Freq: Every day | ORAL | Status: DC

## 2015-07-06 MED ORDER — CARVEDILOL 3.125 MG PO TABS: 3.1250 mg | ORAL_TABLET | Freq: Two times a day (BID) | ORAL | Status: DC

## 2015-07-06 NOTE — Progress Notes (Signed)
Discharge instructions given to patient who verbalized understanding , prescriptions given to patient. Nurse tech took out IV and took monitor off. Nurse tech D/C patient before discharge papers were sighed due to patient telling nurse tech he had his discharge papers which he had not yet sighed . Discharge instructions to be mailed to patient.Patient called and informed.

## 2015-07-06 NOTE — Discharge Summary (Signed)
Physician Discharge Summary  Ronald [name] NTI:[id number] DOB: [birth year] DOA: 07/03/2015  PCP: Philis Fendt, MD  Admit date: 07/03/2015 Discharge date: 07/06/2015  Recommendations for Outpatient Follow-up:  1. Pt will need to follow up with PCP in 2 weeks post discharge 2. BMP in one week   Discharge Diagnoses:  Acute respiratory failure with hypoxemia -weaned to RA and remained stable -Secondary to COPD exacerbation -Pulmonary hygiene -Urine drug screen negative COPD exacerbation -Continue intravenous Solu-Medrol--weaned to po prednisone -Continue bronchodilators -Continue levofloxacin x 3 more days to finish 5 days of therapy -prednisone taper--'60mg'$  daily x 2 days, 40 mg daily x 2 days, then 20 mg daily x 2 days Tobacco abuse  -Tobacco cessation discussed  -He has nearly 40-pack-year history  -Still smoking 2-3 cigarettes per day  chronic diastolic CHF -Clinically compensated -04/06/2012 echocardiogram EF 30-35% -Continue carvedilol -Continue low-dose furosemide 20 mg daily  -Patient states that he only takes furosemide 40 mg every other day at home -Continue spironolactone and Altace Dysuria  -Urinalysis without pyuria  -HIV neg Coronary artery disease/ischemic cardiomyopathy  -Continue Effient  -continue coreg History of non-small cell lung cancer  -Status post left pneumonectomy  -Unfortunately still smoking   Discharge Condition: stable  Disposition: home  Diet:heart healthy Wt Readings from Last 3 Encounters:  07/06/15 60.737 kg (133 lb 14.4 oz)  11/01/13 64.547 kg (142 lb 4.8 oz)  12/06/12 66.271 kg (146 lb 1.6 oz)    History of present illness:  59 year old male with a history of COPD, non-small cell lung cancer status post L-pneumonectomy, hypertension, systolic CHF presented with 3-4 days of creasing shortness of breath, productive cough, and fever. The patient last had a fever approximately 3 days prior to admission of 100.45F.  Apparently, the patient has run out of all his medications and did not have the money to refill them. Unfortunately, he did not notify any of his physicians. The patient has changed his story with different physicians. Initially, the patient stated that he had gained 2 pounds at home in the past 3 days. However, when I inquired about his weight, he states that he normally does not weigh himself regularly. At the time of admission, he stated that he stopped taking his cardiac medications including furosemide because of "low blood pressure", but he informed me that he doesn't normally check his blood pressure at home. Workup revealed COPD exacerbation, and the patient was started on intravenous steroids. He improved clinically. He was weaned to oral steroids. The patient was started on levofloxacin. He will be discharged home with a prednisone taper and 3 additional days of levofloxacin to complete 5 days of therapy.  Discharge Exam: Filed Vitals:   07/06/15 0552  BP: 100/68  Pulse: 87  Temp: 98.1 F (36.7 C)  Resp: 18   Filed Vitals:   07/05/15 2040 07/05/15 2209 07/06/15 0552 07/06/15 0728  BP: 102/63  100/68   Pulse: 83  87   Temp: 98.3 F (36.8 C)  98.1 F (36.7 C)   TempSrc: Oral  Oral   Resp: 16  18   Height:      Weight:   60.737 kg (133 lb 14.4 oz)   SpO2: 98% 98% 99% 99%   General: A&O x 3, NAD, pleasant, cooperative Cardiovascular: RRR, no rub, no gallop, no S3 Respiratory: Diminished breath sounds with bibasilar rales. No wheezing. Abdomen:soft, nontender, nondistended, positive bowel sounds Extremities: No edema, No lymphangitis, no petechiae  Discharge Instructions      Discharge Instructions  Diet - low sodium heart healthy    Complete by:  As directed      Increase activity slowly    Complete by:  As directed             Medication List    STOP taking these medications        nicotine 21 mg/24hr patch  Commonly known as:  NICODERM CQ - dosed in mg/24  hours      TAKE these medications        albuterol 108 (90 BASE) MCG/ACT inhaler  Commonly known as:  PROVENTIL HFA;VENTOLIN HFA  Inhale 2 puffs into the lungs every 4 (four) hours as needed for wheezing or shortness of breath.     carvedilol 3.125 MG tablet  Commonly known as:  COREG  Take 1 tablet (3.125 mg total) by mouth 2 (two) times daily with a meal.     furosemide 40 MG tablet  Commonly known as:  LASIX  Take 1 tablet (40 mg total) by mouth 2 (two) times daily. Twice a day for 3 days, then back to normal dosing.     levofloxacin 750 MG tablet  Commonly known as:  LEVAQUIN  Take 1 tablet (750 mg total) by mouth at bedtime.     montelukast 10 MG tablet  Commonly known as:  SINGULAIR  Take 1 tablet (10 mg total) by mouth at bedtime.     nitroGLYCERIN 0.4 MG SL tablet  Commonly known as:  NITROSTAT  Place 1 tablet (0.4 mg total) under the tongue every 5 (five) minutes x 3 doses as needed for chest pain.     prasugrel 10 MG Tabs tablet  Commonly known as:  EFFIENT  Take 1 tablet (10 mg total) by mouth daily.     predniSONE 20 MG tablet  Commonly known as:  DELTASONE  Take 3 tablets (60 mg total) by mouth daily with breakfast. X 2 days, then 2 tabs (40 mg) daily x 2 days, then 1 tab (20 mg) daily x 2 days     ramipril 1.25 MG capsule  Commonly known as:  ALTACE  Take 1 capsule (1.25 mg total) by mouth daily.     spironolactone 25 MG tablet  Commonly known as:  ALDACTONE  Take 1 tablet (25 mg total) by mouth daily.         The results of significant diagnostics from this hospitalization (including imaging, microbiology, ancillary and laboratory) are listed below for reference.    Significant Diagnostic Studies: Dg Chest 2 View  07/04/2015   CLINICAL DATA:  Shortness of breath  EXAM: CHEST  2 VIEW  COMPARISON:  05/01/2015  FINDINGS: Stable appearance of left limit pneumonectomy changes. The heart and upper mediastinum is distorted from surgery and volume loss,  grossly stable. Coronary stents noted. Hyperinflated right lung shows interstitial coarsening with some Kerley lines. Interstitial opacities improved from 05/01/2015 when there was definite pulmonary edema. Coarsening is greater than 02/26/2015.  IMPRESSION: 1. Interstitial opacities in the right lung is likely combination of chronic bronchitic changes and vascular congestion. 2. Left pneumonectomy.   Electronically Signed   By: Monte Fantasia M.D.   On: 07/04/2015 00:06     Microbiology: Recent Results (from the past 240 hour(s))  Culture, blood (routine x 2) Call MD if unable to obtain prior to antibiotics being given     Status: None (Preliminary result)   Collection Time: 07/04/15  2:51 AM  Result Value Ref Range Status   Specimen  Description BLOOD RIGHT ARM  Final   Special Requests BOTTLES DRAWN AEROBIC AND ANAEROBIC 5CC  Final   Culture NO GROWTH 1 DAY  Final   Report Status PENDING  Incomplete  Culture, blood (routine x 2) Call MD if unable to obtain prior to antibiotics being given     Status: None (Preliminary result)   Collection Time: 07/04/15  3:00 AM  Result Value Ref Range Status   Specimen Description BLOOD LEFT ANTECUBITAL  Final   Special Requests BOTTLES DRAWN AEROBIC AND ANAEROBIC 5CC  Final   Culture NO GROWTH 1 DAY  Final   Report Status PENDING  Incomplete     Labs: Basic Metabolic Panel:  Recent Labs Lab 07/03/15 2111 07/05/15 0307 07/06/15 0336  NA 137 135 134*  K 3.5 4.0 4.6  CL 107 105 102  CO2 21* 23 23  GLUCOSE 116* 153* 116*  BUN 14 14 21*  CREATININE 1.07 0.87 1.05  CALCIUM 8.9 9.1 8.9   Liver Function Tests: No results for input(s): AST, ALT, ALKPHOS, BILITOT, PROT, ALBUMIN in the last 168 hours. No results for input(s): LIPASE, AMYLASE in the last 168 hours. No results for input(s): AMMONIA in the last 168 hours. CBC:  Recent Labs Lab 07/03/15 2111  WBC 6.9  HGB 13.0  HCT 37.2*  MCV 85.1  PLT 285   Cardiac Enzymes: No results  for input(s): CKTOTAL, CKMB, CKMBINDEX, TROPONINI in the last 168 hours. BNP: Invalid input(s): POCBNP CBG: No results for input(s): GLUCAP in the last 168 hours.  Time coordinating discharge:  Greater than 30 minutes  Signed:  Dougles Kimmey, DO [facility] Pager: [phone] 07/06/2015, 9:35 AM

## 2015-07-07 LAB — LEGIONELLA ANTIGEN, URINE

## 2015-07-09 LAB — CULTURE, BLOOD (ROUTINE X 2)
CULTURE: NO GROWTH
Culture: NO GROWTH

## 2015-09-29 ENCOUNTER — Other Ambulatory Visit: Payer: Self-pay | Admitting: Cardiology

## 2015-09-29 DIAGNOSIS — R079 Chest pain, unspecified: Secondary | ICD-10-CM

## 2015-10-06 ENCOUNTER — Ambulatory Visit (HOSPITAL_COMMUNITY)
Admission: RE | Admit: 2015-10-06 | Discharge: 2015-10-06 | Disposition: A | Discharge status: Home / Self Care | Payer: Medicare Other | Source: Ambulatory Visit | Attending: Cardiology | Admitting: Cardiology

## 2015-10-06 ENCOUNTER — Encounter (HOSPITAL_COMMUNITY)
Admission: RE | Admit: 2015-10-06 | Discharge: 2015-10-06 | Disposition: A | Discharge status: Home / Self Care | Payer: Medicare Other | Source: Ambulatory Visit | Attending: Cardiology | Admitting: Cardiology

## 2015-10-06 DIAGNOSIS — R9439 Abnormal result of other cardiovascular function study: Secondary | ICD-10-CM | POA: Insufficient documentation

## 2015-10-06 DIAGNOSIS — R079 Chest pain, unspecified: Secondary | ICD-10-CM

## 2015-10-06 LAB — NM MYOCAR MULTI W/SPECT W/WALL MOTION / EF
CHL CUP STRESS STAGE 1 SPEED: 0 mph
CHL CUP STRESS STAGE 2 GRADE: 0 %
CHL CUP STRESS STAGE 2 SPEED: 0 mph
CHL CUP STRESS STAGE 3 DBP: 62 mmHg
CSEPPBP: 120 mmHg
CSEPPHR: 96 {beats}/min
Estimated workload: 1 METS
Percent of predicted max HR: 59 %
Stage 1 DBP: 68 mmHg
Stage 1 Grade: 0 %
Stage 1 HR: 79 {beats}/min
Stage 1 SBP: 97 mmHg
Stage 2 HR: 79 {beats}/min
Stage 3 Grade: 0 %
Stage 3 HR: 100 {beats}/min
Stage 3 SBP: 121 mmHg
Stage 3 Speed: 0 mph
Stage 4 DBP: 80 mmHg
Stage 4 Grade: 0 %
Stage 4 HR: 96 {beats}/min
Stage 4 SBP: 120 mmHg
Stage 4 Speed: 0 mph

## 2015-10-06 MED ORDER — REGADENOSON 0.4 MG/5ML IV SOLN
INTRAVENOUS | Status: AC
  Filled 2015-10-06: qty 5

## 2015-10-06 MED ORDER — TECHNETIUM TC 99M SESTAMIBI GENERIC - CARDIOLITE
30.0000 | Freq: Once | INTRAVENOUS | Status: AC | PRN
  Administered 2015-10-06: 30 via INTRAVENOUS

## 2015-10-06 MED ORDER — TECHNETIUM TC 99M SESTAMIBI GENERIC - CARDIOLITE
10.0000 | Freq: Once | INTRAVENOUS | Status: AC | PRN
Start: 2015-10-06 — End: 2015-10-06
  Administered 2015-10-06: 10 via INTRAVENOUS

## 2015-10-06 MED ORDER — REGADENOSON 0.4 MG/5ML IV SOLN
0.4000 mg | Freq: Once | INTRAVENOUS | Status: AC
  Administered 2015-10-06: 0.4 mg via INTRAVENOUS

## 2015-10-07 ENCOUNTER — Emergency Department (HOSPITAL_COMMUNITY)
Admission: EM | Admit: 2015-10-07 | Discharge: 2015-10-07 | Disposition: A | Discharge status: Home / Self Care | Payer: Medicare Other | Source: Home / Self Care | Attending: Emergency Medicine | Admitting: Emergency Medicine

## 2015-10-07 ENCOUNTER — Encounter (HOSPITAL_COMMUNITY): Payer: Self-pay | Admitting: *Deleted

## 2015-10-07 DIAGNOSIS — Y998 Other external cause status: Secondary | ICD-10-CM | POA: Insufficient documentation

## 2015-10-07 DIAGNOSIS — Z9889 Other specified postprocedural states: Secondary | ICD-10-CM

## 2015-10-07 DIAGNOSIS — I200 Unstable angina: Secondary | ICD-10-CM | POA: Diagnosis not present

## 2015-10-07 DIAGNOSIS — Z880 Allergy status to penicillin: Secondary | ICD-10-CM | POA: Insufficient documentation

## 2015-10-07 DIAGNOSIS — Y9289 Other specified places as the place of occurrence of the external cause: Secondary | ICD-10-CM | POA: Insufficient documentation

## 2015-10-07 DIAGNOSIS — Z87891 Personal history of nicotine dependence: Secondary | ICD-10-CM | POA: Insufficient documentation

## 2015-10-07 DIAGNOSIS — Y9389 Activity, other specified: Secondary | ICD-10-CM

## 2015-10-07 DIAGNOSIS — Z7902 Long term (current) use of antithrombotics/antiplatelets: Secondary | ICD-10-CM

## 2015-10-07 DIAGNOSIS — R040 Epistaxis: Secondary | ICD-10-CM | POA: Insufficient documentation

## 2015-10-07 DIAGNOSIS — I252 Old myocardial infarction: Secondary | ICD-10-CM

## 2015-10-07 DIAGNOSIS — I509 Heart failure, unspecified: Secondary | ICD-10-CM | POA: Insufficient documentation

## 2015-10-07 DIAGNOSIS — Z85118 Personal history of other malignant neoplasm of bronchus and lung: Secondary | ICD-10-CM | POA: Insufficient documentation

## 2015-10-07 DIAGNOSIS — S0031XA Abrasion of nose, initial encounter: Secondary | ICD-10-CM

## 2015-10-07 DIAGNOSIS — I10 Essential (primary) hypertension: Secondary | ICD-10-CM | POA: Insufficient documentation

## 2015-10-07 DIAGNOSIS — Z7952 Long term (current) use of systemic steroids: Secondary | ICD-10-CM

## 2015-10-07 DIAGNOSIS — I25110 Atherosclerotic heart disease of native coronary artery with unstable angina pectoris: Secondary | ICD-10-CM | POA: Diagnosis not present

## 2015-10-07 DIAGNOSIS — Z9861 Coronary angioplasty status: Secondary | ICD-10-CM

## 2015-10-07 DIAGNOSIS — X58XXXA Exposure to other specified factors, initial encounter: Secondary | ICD-10-CM | POA: Insufficient documentation

## 2015-10-07 DIAGNOSIS — Z79899 Other long term (current) drug therapy: Secondary | ICD-10-CM

## 2015-10-07 DIAGNOSIS — J449 Chronic obstructive pulmonary disease, unspecified: Secondary | ICD-10-CM | POA: Insufficient documentation

## 2015-10-07 DIAGNOSIS — I2510 Atherosclerotic heart disease of native coronary artery without angina pectoris: Secondary | ICD-10-CM | POA: Insufficient documentation

## 2015-10-07 MED ORDER — OXYMETAZOLINE HCL 0.05 % NA SOLN: 2.0000 | Freq: Two times a day (BID) | NASAL | Status: DC | PRN

## 2015-10-07 NOTE — Discharge Instructions (Signed)
Your nose bleed was likely from a small cut in the nose due to picking your nose. DO NOT put anything into your nose. If bleeding reoccurs, use 2 sprays of afrin and apply pressure to the nostrils. Use an ice pack on the nose. If bleeding persists despite these attempts, return to the ER. Follow up with your regular doctor in 3 days. Return to the ER for changes or worsening symptoms.   Nosebleed Nosebleeds are common. A nosebleed can be caused by many things, including:  Getting hit hard in the nose.  Infections.  Dryness in your nose.  A dry climate.  Medicines.  Picking your nose.  Your home heating and cooling systems. HOME CARE   Try controlling your nosebleed by pinching your nostrils gently. Do this for at least 10 minutes.  Avoid blowing or sniffing your nose for a number of hours after having a nosebleed.  Do not put gauze inside of your nose yourself. If your nose was packed by your doctor, try to keep the pack inside of your nose until your doctor removes it.  If a gauze pack was used and it starts to fall out, gently replace it or cut off the end of it.  If a balloon catheter was used to pack your nose, do not cut or remove it unless told by your doctor.  Avoid lying down while you are having a nosebleed. Sit up and lean forward.  Use a nasal spray decongestant to help with a nosebleed as told by your doctor.  Do not use petroleum jelly or mineral oil in your nose. These can drip into your lungs.  Keep your house humid by using:  Less air conditioning.  A humidifier.  Aspirin and blood thinners make bleeding more likely. If you are prescribed these medicines and you have nosebleeds, ask your doctor if you should stop taking the medicines or adjust the dose. Do not stop medicines unless told by your doctor.  Resume your normal activities as you are able. Avoid straining, lifting, or bending at your waist for several days.  If your nosebleed was caused by  dryness in your nose, use over-the-counter saline nasal spray or gel. If you must use a lubricant:  Choose one that is water-soluble.  Use it only as needed.  Do not use it within several hours of lying down.  Keep all follow-up visits as told by your doctor. This is important. GET HELP IF:  You have a fever.  You get frequent nosebleeds.  You are getting nosebleeds more often. GET HELP RIGHT AWAY IF:  Your nosebleed lasts longer than 20 minutes.  Your nosebleed occurs after an injury to your face, and your nose looks crooked or broken.  You have unusual bleeding from other parts of your body.  You have unusual bruising on other parts of your body.  You feel light-headed or dizzy.  You become sweaty.  You throw up (vomit) blood.  You have a nosebleed after a head injury.   This information is not intended to replace advice given to you by your health care provider. Make sure you discuss any questions you have with your health care provider.   Document Released: 08/10/2008 Document Revised: 11/22/2014 Document Reviewed: 06/17/2014 Elsevier Interactive Patient Education [employer].

## 2015-10-07 NOTE — ED Notes (Signed)
Pt states his nosed started bleeding spontaneously and bled for about an hour. States he pulled a large clot out of his nose and the bleeding stopped.

## 2015-10-07 NOTE — ED Provider Notes (Signed)
CSN: [id number]     Arrival date & time 10/07/15  1339 History  By signing my name below, I, Meriel Pica, attest that this documentation has been prepared under the direction and in the presence of Merissa Renwick Camprubi-Soms, PA-C. Electronically Signed: Meriel Pica, ED Scribe. 10/07/2015. 2:17 PM.   Chief Complaint  Patient presents with  . Epistaxis   Patient is a 59 y.o. male presenting with nosebleeds. The history is provided by the patient. No language interpreter was used.  Epistaxis Location:  R nare Severity:  Moderate Duration:  30 minutes Timing:  Rare Progression:  Resolved Chronicity:  New Context: anticoagulants ( Effient ) and nose picking   Context: not bleeding disorder, not elevation change, not foreign body and not trauma   Relieved by:  None tried Worsened by:  Nothing tried Ineffective treatments:  None tried Associated symptoms: no blood in oropharynx, no congestion, no cough, no dizziness, no fever, no headaches, no sore throat and no syncope    HPI Comments: Ronald [name] is a 59 y.o. male, with a PMHx of HTN, CHF, CAD, and MI on Effient '10mg'$  (although pt unsure of this), who presents to the Emergency Department complaining of a sudden onset episode of epistaxis in right nostril onset 1 hour ago that lasted for 30 minutes, and resolved spontaneously. Pt notes he visualized a clot in his right nare just prior to resolution of the epistaxis. He admits to picking his nose frequently. He did not apply any pressure, he only placed a tissue in his nostril to help the bleeding. Denies otalgia or ear drainage, rhinorrhea, sore throat, light-headedness, dizziness, pre-syncopal feeling, cough, congestion, headaches, fevers, chills, nausea, vomiting, diarrhea, constipation, abdominal pain, new or worsening CP or SOB, arthralgias, myalgias, hematuria, dysuria, numbness, tingling, or weakness. He is followed by PCP Dr. Warren Danes. He can't remember what medications he's  on.    Past Medical History  Diagnosis Date  . COPD (chronic obstructive pulmonary disease) ([place])   . CHF (congestive heart failure) ([place])   . lung ca dx'd 2005    chemo/xrt comp 2005, lung ca  . Coronary artery disease   . Myocardial infarction ([place])   . Hypertension   . Shortness of breath    Past Surgical History  Procedure Laterality Date  . Coronary angioplasty with stent placement    . Pneumonectomy  2005    left  . Left heart catheterization with coronary angiogram N/A 04/07/2012    Procedure: LEFT HEART CATHETERIZATION WITH CORONARY ANGIOGRAM;  Surgeon: [name], MD;  Location: [facility] CATH LAB;  Service: Cardiovascular;  Laterality: N/A;   Family History  Problem Relation Age of Onset  . Hypertension Other   . Diabetes Other    Social History  Substance Use Topics  . Smoking status: Former Smoker    Types: Cigarettes    Quit date: 09/30/2013  . Smokeless tobacco: Never Used  . Alcohol Use: No     Comment: former    Review of Systems  Constitutional: Negative for fever and chills.  HENT: Positive for nosebleeds. Negative for congestion, ear discharge, ear pain, rhinorrhea and sore throat.   Respiratory: Negative for cough and shortness of breath.   Cardiovascular: Negative for chest pain and syncope.  Gastrointestinal: Negative for nausea, vomiting, abdominal pain, diarrhea and constipation.  Genitourinary: Negative for dysuria and hematuria.  Musculoskeletal: Negative for myalgias and arthralgias.  Skin: Negative for color change.  Allergic/Immunologic: Negative for immunocompromised state.  Neurological: Negative for dizziness, syncope,  weakness, light-headedness, numbness and headaches.  A complete 10 system review of systems was obtained and is otherwise negative except at noted in the HPI and PMH.  Allergies  Penicillins  Home Medications   Prior to Admission medications   Medication Sig Start Date End Date Taking? Authorizing Provider   albuterol (PROVENTIL HFA;VENTOLIN HFA) 108 (90 BASE) MCG/ACT inhaler Inhale 2 puffs into the lungs every 4 (four) hours as needed for wheezing or shortness of breath. 10/14/13   Kristen N Ward, DO  carvedilol (COREG) 3.125 MG tablet Take 1 tablet (3.125 mg total) by mouth 2 (two) times daily with a meal. 07/06/15   Orson Eva, MD  furosemide (LASIX) 40 MG tablet Take 1 tablet (40 mg total) by mouth 2 (two) times daily. Twice a day for 3 days, then back to normal dosing. 05/02/15   Benjamine Mola, FNP  levofloxacin (LEVAQUIN) 750 MG tablet Take 1 tablet (750 mg total) by mouth at bedtime. Patient not taking: Reported on 10/06/2015 07/06/15   Orson Eva, MD  montelukast (SINGULAIR) 10 MG tablet Take 1 tablet (10 mg total) by mouth at bedtime. 05/02/15   Benjamine Mola, FNP  nitroGLYCERIN (NITROSTAT) 0.4 MG SL tablet Place 1 tablet (0.4 mg total) under the tongue every 5 (five) minutes x 3 doses as needed for chest pain. 11/01/13   [name], MD  prasugrel (EFFIENT) 10 MG TABS tablet Take 1 tablet (10 mg total) by mouth daily. 07/06/15   Orson Eva, MD  predniSONE (DELTASONE) 20 MG tablet Take 3 tablets (60 mg total) by mouth daily with breakfast. X 2 days, then 2 tabs (40 mg) daily x 2 days, then 1 tab (20 mg) daily x 2 days Patient not taking: Reported on 10/06/2015 07/06/15   Orson Eva, MD  ramipril (ALTACE) 1.25 MG capsule Take 1 capsule (1.25 mg total) by mouth daily. 07/06/15 01/02/17  Orson Eva, MD  spironolactone (ALDACTONE) 25 MG tablet Take 1 tablet (25 mg total) by mouth daily. 07/06/15   Orson Eva, MD   BP 107/75 mmHg  Pulse 85  Temp(Src) 98.1 F (36.7 C) (Oral)  Resp 16  SpO2 98% Physical Exam  Constitutional: He is oriented to person, place, and time. Vital signs are normal. He appears well-developed and well-nourished.  Non-toxic appearance. No distress.  Afebrile, nontoxic, NAD  HENT:  Head: Normocephalic and atraumatic.  Nose: Nose lacerations present. No nasal septal hematoma. No  epistaxis.  Mouth/Throat: Mucous membranes are normal.  Small abrasion to outer portion of the right nostril nasal mucosa, consistent with finger nail abrasion, with dried blood noted around the area, no ongoing epistaxis, no posterior bleeding noted. no septal hematoma. Oropharynx clear with no signs of oropharyngeal blood.   Eyes: Conjunctivae and EOM are normal. Right eye exhibits no discharge. Left eye exhibits no discharge.  Neck: Normal range of motion. Neck supple.  Cardiovascular: Normal rate.   Pulmonary/Chest: Effort normal. No respiratory distress.  Abdominal: Normal appearance. He exhibits no distension.  Musculoskeletal: Normal range of motion.  Neurological: He is alert and oriented to person, place, and time. He has normal strength. No sensory deficit.  Skin: Skin is warm, dry and intact. No rash noted.  Psychiatric: He has a normal mood and affect.  Nursing note and vitals reviewed.   ED Course  Procedures  DIAGNOSTIC STUDIES: Oxygen Saturation is 98% on RA, normal by my interpretation.    COORDINATION OF CARE: 2:14 PM Discussed treatment plan with pt at bedside which includes  to prescribe Afrin nasal spray and pt agreed to plan. Pt is regularly followed by Dr. Warren Danes and agrees to follow up with him in 3 days.   MDM   Final diagnoses:  Anterior epistaxis  Nasal abrasion, initial encounter    59 y.o. male here with epistaxis earlier today that resolved prior to arrival. Pt unsure if he's on blood thinners but prasugrel is listed on his medications. On exam, small abrasion to nasal mucosa likely from nose picking, clotted off with no ongoing epistaxis. No abnormal vital signs, no s/sx of acute blood loss anemia. Discussed avoidance of putting anything into his nose, and use of afrin PRN nose bleed. Discussed pressure and ice pack use for re-bleeds. If bleeding persists, return to ER. F/up with PCP in 3 days. I explained the diagnosis and have given explicit precautions  to return to the ER including for any other new or worsening symptoms. The patient understands and accepts the medical plan as it's been dictated and I have answered their questions. Discharge instructions concerning home care and prescriptions have been given. The patient is STABLE and is discharged to home in good condition.   I personally performed the services described in this documentation, which was scribed in my presence. The recorded information has been reviewed and is accurate.  BP 107/75 mmHg  Pulse 85  Temp(Src) 98.1 F (36.7 C) (Oral)  Resp 16  SpO2 98%  Meds ordered this encounter  Medications  . oxymetazoline (AFRIN NASAL SPRAY) 0.05 % nasal spray    Sig: Place 2 sprays into both nostrils 2 (two) times daily as needed (nose bleed). Do not use more than 3 days consecutively    Dispense:  20 mL    Refill:  0    Order Specific Question:  Supervising Provider    Answer:  Noemi Chapel [[id number]]      Sneijder Bernards Camprubi-Soms, PA-C 10/07/15 [place], MD 10/09/15 2059

## 2015-10-07 NOTE — ED Notes (Signed)
Pt c/o epistaxis onset today @ 13:00, pt denies injury & trauma to the area, denies taking blood thinners, A&O x4, bleeding not present upon arrival to ED

## 2015-10-10 ENCOUNTER — Emergency Department (HOSPITAL_COMMUNITY): Payer: Medicare Other

## 2015-10-10 ENCOUNTER — Inpatient Hospital Stay (HOSPITAL_COMMUNITY)
Admission: EM | Admit: 2015-10-10 | Discharge: 2015-10-14 | DRG: 287 | Disposition: A | Discharge status: Home / Self Care | Payer: Medicare Other | Attending: Cardiology | Admitting: Cardiology

## 2015-10-10 ENCOUNTER — Other Ambulatory Visit: Payer: Self-pay

## 2015-10-10 ENCOUNTER — Encounter (HOSPITAL_COMMUNITY): Payer: Self-pay | Admitting: *Deleted

## 2015-10-10 DIAGNOSIS — Z955 Presence of coronary angioplasty implant and graft: Secondary | ICD-10-CM

## 2015-10-10 DIAGNOSIS — I200 Unstable angina: Secondary | ICD-10-CM | POA: Diagnosis present

## 2015-10-10 DIAGNOSIS — I5022 Chronic systolic (congestive) heart failure: Secondary | ICD-10-CM | POA: Diagnosis present

## 2015-10-10 DIAGNOSIS — Z902 Acquired absence of lung [part of]: Secondary | ICD-10-CM | POA: Diagnosis not present

## 2015-10-10 DIAGNOSIS — R079 Chest pain, unspecified: Secondary | ICD-10-CM

## 2015-10-10 DIAGNOSIS — Z87891 Personal history of nicotine dependence: Secondary | ICD-10-CM

## 2015-10-10 DIAGNOSIS — J449 Chronic obstructive pulmonary disease, unspecified: Secondary | ICD-10-CM | POA: Diagnosis present

## 2015-10-10 DIAGNOSIS — Z85118 Personal history of other malignant neoplasm of bronchus and lung: Secondary | ICD-10-CM

## 2015-10-10 DIAGNOSIS — E785 Hyperlipidemia, unspecified: Secondary | ICD-10-CM | POA: Diagnosis present

## 2015-10-10 DIAGNOSIS — Z8249 Family history of ischemic heart disease and other diseases of the circulatory system: Secondary | ICD-10-CM | POA: Diagnosis not present

## 2015-10-10 DIAGNOSIS — I252 Old myocardial infarction: Secondary | ICD-10-CM

## 2015-10-10 DIAGNOSIS — Z79899 Other long term (current) drug therapy: Secondary | ICD-10-CM | POA: Diagnosis not present

## 2015-10-10 DIAGNOSIS — I255 Ischemic cardiomyopathy: Secondary | ICD-10-CM | POA: Diagnosis present

## 2015-10-10 DIAGNOSIS — I340 Nonrheumatic mitral (valve) insufficiency: Secondary | ICD-10-CM | POA: Diagnosis present

## 2015-10-10 DIAGNOSIS — I10 Essential (primary) hypertension: Secondary | ICD-10-CM | POA: Diagnosis present

## 2015-10-10 DIAGNOSIS — I25110 Atherosclerotic heart disease of native coronary artery with unstable angina pectoris: Principal | ICD-10-CM | POA: Diagnosis present

## 2015-10-10 DIAGNOSIS — Z880 Allergy status to penicillin: Secondary | ICD-10-CM | POA: Diagnosis not present

## 2015-10-10 LAB — RAPID URINE DRUG SCREEN, HOSP PERFORMED
AMPHETAMINES: NOT DETECTED
BARBITURATES: NOT DETECTED
Benzodiazepines: NOT DETECTED
COCAINE: NOT DETECTED
Opiates: NOT DETECTED
TETRAHYDROCANNABINOL: NOT DETECTED

## 2015-10-10 LAB — COMPREHENSIVE METABOLIC PANEL
ALBUMIN: 3 g/dL — AB (ref 3.5–5.0)
ALK PHOS: 60 U/L (ref 38–126)
ALT: 11 U/L — AB (ref 17–63)
AST: 20 U/L (ref 15–41)
Anion gap: 8 (ref 5–15)
BUN: 12 mg/dL (ref 6–20)
CALCIUM: 8.3 mg/dL — AB (ref 8.9–10.3)
CHLORIDE: 105 mmol/L (ref 101–111)
CO2: 21 mmol/L — AB (ref 22–32)
CREATININE: 0.87 mg/dL (ref 0.61–1.24)
GFR calc Af Amer: >60 mL/min (ref >60)
GFR calc non Af Amer: >60 mL/min (ref >60)
GLUCOSE: 163 mg/dL — AB (ref 65–99)
Potassium: 3.1 mmol/L — ABNORMAL LOW (ref 3.5–5.1)
SODIUM: 134 mmol/L — AB (ref 135–145)
Total Bilirubin: 0.2 mg/dL — ABNORMAL LOW (ref 0.3–1.2)
Total Protein: 6 g/dL — ABNORMAL LOW (ref 6.5–8.1)

## 2015-10-10 LAB — ETHANOL: Alcohol, Ethyl (B): <5 mg/dL (ref <5)

## 2015-10-10 LAB — BASIC METABOLIC PANEL
Anion gap: 7 (ref 5–15)
BUN: 14 mg/dL (ref 6–20)
CHLORIDE: 107 mmol/L (ref 101–111)
CO2: 25 mmol/L (ref 22–32)
CREATININE: 0.82 mg/dL (ref 0.61–1.24)
Calcium: 9.1 mg/dL (ref 8.9–10.3)
GFR calc non Af Amer: >60 mL/min (ref >60)
Glucose, Bld: 64 mg/dL — ABNORMAL LOW (ref 65–99)
Potassium: 4.2 mmol/L (ref 3.5–5.1)
Sodium: 139 mmol/L (ref 135–145)

## 2015-10-10 LAB — APTT: APTT: 112 s — AB (ref 24–37)

## 2015-10-10 LAB — CBC WITH DIFFERENTIAL/PLATELET
Basophils Absolute: 0 10*3/uL (ref 0.0–0.1)
Basophils Relative: 0 %
EOS ABS: 0.3 10*3/uL (ref 0.0–0.7)
EOS PCT: 4 %
HCT: 35.1 % — ABNORMAL LOW (ref 39.0–52.0)
HEMOGLOBIN: 11.8 g/dL — AB (ref 13.0–17.0)
LYMPHS ABS: 1.7 10*3/uL (ref 0.7–4.0)
LYMPHS PCT: 28 %
MCH: 28.6 pg (ref 26.0–34.0)
MCHC: 33.6 g/dL (ref 30.0–36.0)
MCV: 85 fL (ref 78.0–100.0)
MONOS PCT: 7 %
Monocytes Absolute: 0.4 10*3/uL (ref 0.1–1.0)
NEUTROS PCT: 61 %
Neutro Abs: 3.7 10*3/uL (ref 1.7–7.7)
Platelets: 259 10*3/uL (ref 150–400)
RBC: 4.13 MIL/uL — AB (ref 4.22–5.81)
RDW: 16.1 % — ABNORMAL HIGH (ref 11.5–15.5)
WBC: 6.1 10*3/uL (ref 4.0–10.5)

## 2015-10-10 LAB — PROTIME-INR
INR: 1.13 (ref 0.00–1.49)
Prothrombin Time: 14.7 seconds (ref 11.6–15.2)

## 2015-10-10 LAB — CBC
HEMATOCRIT: 38.2 % — AB (ref 39.0–52.0)
Hemoglobin: 12.9 g/dL — ABNORMAL LOW (ref 13.0–17.0)
MCH: 29 pg (ref 26.0–34.0)
MCHC: 33.8 g/dL (ref 30.0–36.0)
MCV: 85.8 fL (ref 78.0–100.0)
PLATELETS: 291 10*3/uL (ref 150–400)
RBC: 4.45 MIL/uL (ref 4.22–5.81)
RDW: 16.2 % — ABNORMAL HIGH (ref 11.5–15.5)
WBC: 6.2 10*3/uL (ref 4.0–10.5)

## 2015-10-10 LAB — TROPONIN I
Troponin I: <0.03 ng/mL (ref <0.031)
Troponin I: <0.03 ng/mL (ref <0.031)

## 2015-10-10 LAB — I-STAT TROPONIN, ED: Troponin i, poc: 0.02 ng/mL (ref 0.00–0.08)

## 2015-10-10 MED ORDER — HEPARIN (PORCINE) IN NACL 100-0.45 UNIT/ML-% IJ SOLN
800.0000 [IU]/h | INTRAMUSCULAR | Status: DC
  Administered 2015-10-10: 700 [IU]/h via INTRAVENOUS
  Administered 2015-10-11: 800 [IU]/h via INTRAVENOUS
  Administered 2015-10-11: 900 [IU]/h via INTRAVENOUS
  Administered 2015-10-13: 800 [IU]/h via INTRAVENOUS
  Filled 2015-10-10 (×4): qty 250

## 2015-10-10 MED ORDER — ASPIRIN EC 81 MG PO TBEC
81.0000 mg | DELAYED_RELEASE_TABLET | Freq: Every day | ORAL | Status: DC
  Administered 2015-10-11 – 2015-10-14 (×4): 81 mg via ORAL
  Filled 2015-10-10 (×5): qty 1

## 2015-10-10 MED ORDER — NITROGLYCERIN 0.4 MG SL SUBL: 0.4000 mg | SUBLINGUAL_TABLET | SUBLINGUAL | Status: DC | PRN

## 2015-10-10 MED ORDER — PANTOPRAZOLE SODIUM 40 MG PO TBEC
40.0000 mg | DELAYED_RELEASE_TABLET | Freq: Every day | ORAL | Status: DC
Start: 2015-10-11 — End: 2015-10-14
  Administered 2015-10-11 – 2015-10-14 (×4): 40 mg via ORAL
  Filled 2015-10-10 (×4): qty 1

## 2015-10-10 MED ORDER — ALBUTEROL SULFATE (2.5 MG/3ML) 0.083% IN NEBU: 2.5000 mg | INHALATION_SOLUTION | RESPIRATORY_TRACT | Status: DC | PRN

## 2015-10-10 MED ORDER — ONDANSETRON HCL 4 MG/2ML IJ SOLN
4.0000 mg | Freq: Four times a day (QID) | INTRAMUSCULAR | Status: DC | PRN
  Administered 2015-10-11: 4 mg via INTRAVENOUS
  Filled 2015-10-10: qty 2

## 2015-10-10 MED ORDER — NITROGLYCERIN 2 % TD OINT
0.5000 [in_us] | TOPICAL_OINTMENT | Freq: Three times a day (TID) | TRANSDERMAL | Status: DC
  Administered 2015-10-10 – 2015-10-12 (×2): 0.5 [in_us] via TOPICAL
  Filled 2015-10-10: qty 30

## 2015-10-10 MED ORDER — CARVEDILOL 3.125 MG PO TABS
3.1250 mg | ORAL_TABLET | Freq: Two times a day (BID) | ORAL | Status: DC
  Administered 2015-10-12 – 2015-10-14 (×2): 3.125 mg via ORAL
  Filled 2015-10-10 (×4): qty 1

## 2015-10-10 MED ORDER — SODIUM CHLORIDE 0.9 % IV SOLN
INTRAVENOUS | Status: DC
  Administered 2015-10-11: 07:00:00 via INTRAVENOUS

## 2015-10-10 MED ORDER — ASPIRIN 81 MG PO CHEW
324.0000 mg | CHEWABLE_TABLET | Freq: Once | ORAL | Status: AC
  Administered 2015-10-10: 324 mg via ORAL
  Filled 2015-10-10: qty 4

## 2015-10-10 MED ORDER — ACETAMINOPHEN 325 MG PO TABS
650.0000 mg | ORAL_TABLET | ORAL | Status: DC | PRN
  Administered 2015-10-11 – 2015-10-12 (×3): 650 mg via ORAL
  Filled 2015-10-10 (×3): qty 2

## 2015-10-10 MED ORDER — HEPARIN BOLUS VIA INFUSION
3500.0000 [IU] | Freq: Once | INTRAVENOUS | Status: AC
  Administered 2015-10-10: 3500 [IU] via INTRAVENOUS
  Filled 2015-10-10: qty 3500

## 2015-10-10 MED ORDER — PRASUGREL HCL 10 MG PO TABS
10.0000 mg | ORAL_TABLET | Freq: Every day | ORAL | Status: DC
  Administered 2015-10-10 – 2015-10-14 (×5): 10 mg via ORAL
  Filled 2015-10-10 (×5): qty 1

## 2015-10-10 MED ORDER — MONTELUKAST SODIUM 10 MG PO TABS
10.0000 mg | ORAL_TABLET | Freq: Every day | ORAL | Status: DC
  Administered 2015-10-10 – 2015-10-13 (×4): 10 mg via ORAL
  Filled 2015-10-10 (×4): qty 1

## 2015-10-10 NOTE — H&P (Signed)
Ronald [name] is an 59 y.o. male.   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  Standard cisplatin was associated with mild shortness of breath, nausea, due to recurrent chest pain since yesterday.  Patient decided to come to the ED.  Patient had nuclear stress test on 10/06/2015 that showed mid and basal anterolateral ischemia with EF of 46%.  Patient received several lower lateral abdomen was started on IV heparin relief of chest pain.  Patient presently denies any chest pain.  Patient had appointment to see me in my office next week, but due to recurrent chest pain.  Decided to come to ED.  Past Medical History  Diagnosis Date  . COPD (chronic obstructive pulmonary disease) ([place])   . CHF (congestive heart failure) ([place])   . lung ca dx'd 2005    chemo/xrt comp 2005, lung ca  . Coronary artery disease   . Myocardial infarction ([place])   . Hypertension   . Shortness of breath     Past Surgical History  Procedure Laterality Date  . Coronary angioplasty with stent placement    . Pneumonectomy  2005    left  . Left heart catheterization with coronary angiogram N/A 04/07/2012    Procedure: LEFT HEART CATHETERIZATION WITH CORONARY ANGIOGRAM;  Surgeon: [name], MD;  Location: [facility] CATH LAB;  Service: Cardiovascular;  Laterality: N/A;     Family History  Problem Relation Age of Onset  . Hypertension Other   . Diabetes Other    Social History:  reports that he quit smoking about 2 years ago. His smoking use included Cigarettes. He has never used smokeless tobacco. He reports that he does not drink alcohol or use illicit drugs.  Allergies:  Allergies  Allergen Reactions  . Penicillins Itching     (Not in a hospital admission)  Results for orders placed or performed during the hospital encounter of 10/10/15 (from the past 48 hour(s))  Basic metabolic panel     Status: Abnormal   Collection Time: 10/10/15  3:37 PM  Result Value Ref Range   Sodium 139 135 - 145 mmol/L   Potassium 4.2 3.5 - 5.1 mmol/L   Chloride 107 101 - 111 mmol/L   CO2 25 22 - 32 mmol/L   Glucose, Bld 64 (L) 65 - 99 mg/dL   BUN 14 6 - 20 mg/dL   Creatinine, Ser 0.82 0.61 - 1.24 mg/dL   Calcium 9.1 8.9 - 10.3 mg/dL   GFR calc non Af Amer >60 >60 mL/min   GFR calc Af Amer >60 >60 mL/min    Comment: (NOTE) The eGFR has been calculated using the CKD EPI equation. This calculation has not been validated in all clinical situations. eGFR's persistently <60 mL/min signify possible Chronic Kidney Disease.    Anion gap 7 5 - 15  CBC     Status: Abnormal   Collection Time: 10/10/15  3:37 PM  Result Value  Ref Range   WBC 6.2 4.0 - 10.5 K/uL   RBC 4.45 4.22 - 5.81 MIL/uL   Hemoglobin 12.9 (L) 13.0 - 17.0 g/dL   HCT 38.2 (L) 39.0 - 52.0 %   MCV 85.8 78.0 - 100.0 fL   MCH 29.0 26.0 - 34.0 pg   MCHC 33.8 30.0 - 36.0 g/dL   RDW 16.2 (H) 11.5 - 15.5 %   Platelets 291 150 - 400 K/uL  I-stat troponin, ED (not at [facility], [facility])     Status: None   Collection Time: 10/10/15  3:59 PM  Result Value Ref Range   Troponin i, poc 0.02 0.00 - 0.08 ng/mL   Comment 3            Comment: Due to the release kinetics of cTnI, a negative result within the first hours of the onset of symptoms does not rule out myocardial infarction with certainty. If myocardial  infarction is still suspected, repeat the test at appropriate intervals.   Ethanol     Status: None   Collection Time: 10/10/15  5:26 PM  Result Value Ref Range   Alcohol, Ethyl (B) <5 <5 mg/dL    Comment:        LOWEST DETECTABLE LIMIT FOR SERUM ALCOHOL IS 5 mg/dL FOR MEDICAL PURPOSES ONLY   Troponin I     Status: None   Collection Time: 10/10/15  6:32 PM  Result Value Ref Range   Troponin I <0.03 <0.031 ng/mL    Comment:        NO INDICATION OF MYOCARDIAL INJURY.   Urine rapid drug screen (hosp performed)     Status: None   Collection Time: 10/10/15  7:16 PM  Result Value Ref Range   Opiates NONE DETECTED NONE DETECTED   Cocaine NONE DETECTED NONE DETECTED   Benzodiazepines NONE DETECTED NONE DETECTED   Amphetamines NONE DETECTED NONE DETECTED   Tetrahydrocannabinol NONE DETECTED NONE DETECTED   Barbiturates NONE DETECTED NONE DETECTED    Comment:        DRUG SCREEN FOR MEDICAL PURPOSES ONLY.  IF CONFIRMATION IS NEEDED FOR ANY PURPOSE, NOTIFY LAB WITHIN 5 DAYS.        LOWEST DETECTABLE LIMITS FOR URINE DRUG SCREEN Drug Class       Cutoff (ng/mL) Amphetamine      1000 Barbiturate      200 Benzodiazepine   798 Tricyclics       921 Opiates          300 Cocaine          300 THC              50    Dg Chest 2 View  10/10/2015  CLINICAL DATA:  Left-sided chest pain, shortness of breath, nausea and dizziness. Status post left pneumonectomy. EXAM: CHEST  2 VIEW COMPARISON:  Chest radiograph 07/03/2015 FINDINGS: Patient is status post left pneumonectomy. Coronary arterial stents are noted. Cardiomediastinal silhouette is largely obscured by left postsurgical hydrothorax with stable appearance. There is no evidence of pleural effusion or pneumothorax on the right. There is mild increase of interstitial markings with lower lobe predominance. Vague airspace opacity is also seen in the right mid lung. No evidence of displaced rib fractures. Soft tissues are grossly normal.  IMPRESSION: Increased interstitial markings of the right lung with lower lobe predominance. These findings may represent interstitial pulmonary edema or interstitial infiltrate. Subtle airspace opacity overlying right midlung. Follow-up to resolution is recommended. Stable changes from left pneumonectomy.  Electronically Signed   By: Fidela Salisbury M.D.   On: 10/10/2015 14:38    Review of Systems  Constitutional: Negative for fever, chills and diaphoresis.  Eyes: Negative for double vision and photophobia.  Respiratory: Positive for shortness of breath.   Cardiovascular: Positive for chest pain. Negative for palpitations, orthopnea, claudication and leg swelling.  Gastrointestinal: Positive for nausea. Negative for abdominal pain.  Genitourinary: Negative for dysuria.  Neurological: Negative for dizziness, weakness and headaches.    Blood pressure 98/75, pulse 90, temperature 97.6 F (36.4 C), temperature source Oral, resp. rate 22, SpO2 98 %. Physical Exam  Constitutional: He is oriented to person, place, and time.  HENT:  Head: Normocephalic and atraumatic.  Eyes: Conjunctivae are normal. Pupils are equal, round, and reactive to light. Left eye exhibits discharge. No scleral icterus.  Neck: Normal range of motion. Neck supple. No JVD present. No tracheal deviation present. No thyromegaly present.  Cardiovascular: Normal rate and regular rhythm.   Murmur: 2/6 systolic murmur noted. Respiratory:  Decreased breath sounds left lung field and decreased breath sounds at right base  GI: Soft. Bowel sounds are normal. He exhibits no distension. There is no tenderness.  Musculoskeletal: He exhibits no edema or tenderness.  Neurological: He is alert and oriented to person, place, and time.     Assessment/Plan Unstable angina.  Also nuclear stress test, rule out MI. Coronary artery disease, history of multiple MRSA PCR in the past as above. Ischemic cardiomyopathy. Valvular heart  disease Hypertension Hyperlipidemia Tobacco abuse. History of cocaine abuse. History of non-small cell CA of left lung status post left pneumonectomy in the past Plan As per orders. Discussed with patient regarding nuclear stress test results and left cardiac cath, possible PTCA and stenting.  Its risks and benefits, i.e., death, MI, stroke, need for emergency CABG, local vascular complications, etc., and consents for PCI [name] 10/10/2015, 7:52 PM

## 2015-10-10 NOTE — Progress Notes (Signed)
ANTICOAGULATION CONSULT NOTE - Initial Consult  Pharmacy Consult for Heparin Indication: ACS /STEMI  Allergies  Allergen Reactions  . Penicillins Itching    Patient Measurements: TBW 60.7 kg IBW 50 kg  Vital Signs: Temp: 97.6 F (36.4 C) (11/25 1351) Temp Source: Oral (11/25 1351) BP: 100/71 mmHg (11/25 1351) Pulse Rate: 88 (11/25 1351)  Labs:  Recent Labs  10/10/15 1537  HGB 12.9*  HCT 38.2*  PLT 291  CREATININE 0.82    CrCl cannot be calculated (Unknown ideal weight.).   Medical History: Past Medical History  Diagnosis Date  . COPD (chronic obstructive pulmonary disease) ([place])   . CHF (congestive heart failure) ([place])   . lung ca dx'd 2005    chemo/xrt comp 2005, lung ca  . Coronary artery disease   . Myocardial infarction ([place])   . Hypertension   . Shortness of breath     Assessment: 59 yo M presents on 11/25 with chest pain. PMH significant for CAD and MI. Pharmacy consulted to start heparin. No anticoag PTA. Hgb is 12.9, plts wnl. No s/s of bleed.  Goal of Therapy:  Heparin level 0.3-0.7 units/ml Monitor platelets by anticoagulation protocol: Yes   Plan:  Give heparin 3,500 unit BOLUS Start heparin gtt at 700 units/hr Check 6 hr HL Monitor daily HL, CBC, s/s of bleed  Elenor Quinones, PharmD, [facility] Clinical Pharmacist Pager [phone] 10/10/2015 6:38 PM

## 2015-10-10 NOTE — ED Notes (Addendum)
Pt stuck twice by Otila Kluver NT.  When ask pt if i could stick him he was very rude and advised if i stuck him in his hand "im the one that would be admited."

## 2015-10-10 NOTE — ED Notes (Signed)
Dr. Franki Cabot office called'@[phone]'$ 

## 2015-10-10 NOTE — ED Notes (Signed)
Pt given [place] sandwich and sprite.

## 2015-10-10 NOTE — ED Provider Notes (Signed)
CSN: [id number]     Arrival date & time 10/10/15  1335 History   First MD Initiated Contact with Patient 10/10/15 1702     Chief Complaint  Patient presents with  . Chest Pain     (Consider location/radiation/quality/duration/timing/severity/associated sxs/prior Treatment) HPI Comments: He reports pressure-like left-sided chest pain that woke he up this morning at 4am. Pain has been intermittent since, lasting 5-14mns at a time. Worse with movement and improves with rest. No improvement with nitroglycerin.  History of coronary artery disease status post 3 stents placed.  He reports he had a stress test last Monday and was told that this was positive; Has appointment with his cardiologist this coming Monday to discuss treatment. Denies current or recent tobacco, alcohol, drug use.   Patient is a 59y.o. male presenting with chest pain. The history is provided by the patient.  Chest Pain Pain location:  L chest Pain quality: pressure   Pain radiates to:  Does not radiate Pain radiates to the back: no   Pain severity:  Moderate Onset quality:  Sudden Duration:  12 hours Timing:  Intermittent Progression:  Waxing and waning Chronicity:  Recurrent Context: movement   Context: not breathing, no drug use, not eating, no intercourse, not lifting, not raising an arm, not at rest, no stress and no trauma   Relieved by:  Rest Worsened by:  Movement Ineffective treatments:  Nitroglycerin Associated symptoms: nausea and shortness of breath   Associated symptoms: no abdominal pain, no AICD problem, no altered mental status, no anxiety, no back pain, no claudication, no cough, no diaphoresis, no dizziness, no fatigue, no fever, no headache, no heartburn, no lower extremity edema, no near-syncope, no numbness, no orthopnea, no palpitations, no PND, no syncope, not vomiting and no weakness   Nausea:    Severity:  Mild   Onset quality:  Gradual   Timing:  Constant Risk factors: coronary artery  disease, hypertension and male sex   Risk factors: no aortic disease     Past Medical History  Diagnosis Date  . COPD ([place]   . CH[place] ([place]estive heart failure) ([facility]   . lung ca dx'd 2005    chemo/xrt comp 2005, lung ca  . Coronary artery disease   . Myocardial infarction ([facility]   . Hypertension   . Shortness of breath    Past Surgical History  Procedure Laterality Date  . Coronary angioplasty with stent placement    . Pneumonectomy  2005    left  . Left heart catheterization with coronary angiogram N/A 04/07/2012    Procedure: LEFT HEART CATHETERIZATION WITH CORONARY ANGIOGRAM;  Surgeon: MClent Demark MD;  [facility];  Service: Cardiovascular;  Laterality: N/A;   Family History  Problem Relation Age of Onset  . Hypertension Other   . Diabetes Other    Social History  Substance Use Topics  . Smoking status: Former Smoker    Types: Cigarettes    Quit date: 09/30/2013  . Smokeless tobacco: Never Used  . Alcohol Use: No     Comment: former    Review of Systems  Constitutional: Positive for chills. Negative for fever, diaphoresis, activity change, appetite change and fatigue.  HENT: Negative.   Respiratory: Positive for chest tightness and shortness of breath. Negative for cough.   Cardiovascular: Positive for chest pain. Negative for palpitations, orthopnea, claudication, leg swelling, syncope, PND and near-syncope.  Gastrointestinal: Positive for nausea. Negative for heartburn, vomiting, abdominal pain and diarrhea.  Musculoskeletal: Negative for back pain.  Neurological: Negative for dizziness, weakness, numbness and headaches.      Allergies  Penicillins  Home Medications   Prior to Admission medications   Medication Sig Start Date End Date Taking? Authorizing Provider  albuterol (PROVENTIL HFA;VENTOLIN HFA) 108 (90 BASE) MCG/ACT inhaler Inhale 2 puffs into the lungs every 4 (four) hours as needed for wheezing  or shortness of breath. 10/14/13  Yes Kristen N Ward, DO  carvedilol (COREG) 3.125 MG tablet Take 1 tablet (3.125 mg total) by mouth 2 (two) times daily with a meal. 07/06/15  Yes Orson Eva, MD  furosemide (LASIX) 40 MG tablet Take 1 tablet (40 mg total) by mouth 2 (two) times daily. Twice a day for 3 days, then back to normal dosing. 05/02/15  Yes Benjamine Mola, FNP  montelukast (SINGULAIR) 10 MG tablet Take 1 tablet (10 mg total) by mouth at bedtime. Patient taking differently: Take 10 mg by mouth every morning.  05/02/15  Yes Benjamine Mola, FNP  nitroGLYCERIN (NITROSTAT) 0.4 MG SL tablet Place 1 tablet (0.4 mg total) under the tongue every 5 (five) minutes x 3 doses as needed for chest pain. 11/01/13  Yes [name], MD  prasugrel (EFFIENT) 10 MG TABS tablet Take 1 tablet (10 mg total) by mouth daily. 07/06/15  Yes Orson Eva, MD  predniSONE (DELTASONE) 20 MG tablet Take 3 tablets (60 mg total) by mouth daily with breakfast. X 2 days, then 2 tabs (40 mg) daily x 2 days, then 1 tab (20 mg) daily x 2 days 07/06/15  Yes Orson Eva, MD  ramipril (ALTACE) 1.25 MG capsule Take 1 capsule (1.25 mg total) by mouth daily. 07/06/15 01/02/17 Yes Orson Eva, MD  spironolactone (ALDACTONE) 25 MG tablet Take 1 tablet (25 mg total) by mouth daily. 07/06/15  Yes Orson Eva, MD   BP 94/71 mmHg  Pulse 86  Temp(Src) 97.6 F (36.4 C) (Oral)  Resp 19  SpO2 98% Physical Exam  Constitutional: He is oriented to person, place, and time. He appears well-developed and well-nourished. No distress.  Eyes: EOM are normal. Pupils are equal, round, and reactive to light.  Neck: No JVD present.  Cardiovascular: Normal rate, regular rhythm and intact distal pulses.   3/6 systolic murmur radiating to axilla   Pulmonary/Chest: Effort normal. No respiratory distress. He has no wheezes.  Absent breath sounds on left. Mild rales on right  Abdominal: Soft. Bowel sounds are normal. He exhibits no distension. There is no tenderness.   Neurological: He is alert and oriented to person, place, and time. No cranial nerve deficit. He exhibits normal muscle tone. Coordination normal.  Skin: Skin is warm. No rash noted. He is not diaphoretic. No erythema.  Nursing note and vitals reviewed.   ED Course  Procedures (including critical care time) Labs Review Labs Reviewed  BASIC METABOLIC PANEL - Abnormal; Notable for the following:    Glucose, Bld 64 (*)    All other components within normal limits  CBC - Abnormal; Notable for the following:    Hemoglobin 12.9 (*)    HCT 38.2 (*)    RDW 16.2 (*)    All other components within normal limits  URINE RAPID DRUG SCREEN, HOSP PERFORMED  ETHANOL  TROPONIN I  CBC  HEPARIN LEVEL (UNFRACTIONATED)  I-STAT TROPOININ, ED    Imaging Review Dg Chest 2 View  10/10/2015  CLINICAL DATA:  Left-sided chest pain, shortness of breath, nausea and dizziness. Status post left pneumonectomy. EXAM:  CHEST  2 VIEW COMPARISON:  Chest radiograph 07/03/2015 FINDINGS: Patient is status post left pneumonectomy. Coronary arterial stents are noted. Cardiomediastinal silhouette is largely obscured by left postsurgical hydrothorax with stable appearance. There is no evidence of pleural effusion or pneumothorax on the right. There is mild increase of interstitial markings with lower lobe predominance. Vague airspace opacity is also seen in the right mid lung. No evidence of displaced rib fractures. Soft tissues are grossly normal. IMPRESSION: Increased interstitial markings of the right lung with lower lobe predominance. These findings may represent interstitial pulmonary edema or interstitial infiltrate. Subtle airspace opacity overlying right midlung. Follow-up to resolution is recommended. Stable changes from left pneumonectomy. Electronically Signed   By: Fidela Salisbury M.D.   On: 10/10/2015 14:38   I have personally reviewed and evaluated these images and lab results as part of my medical  decision-making.   EKG Interpretation   Date/Time:  Friday October 10 2015 13:45:08 EST Ventricular Rate:  90 PR Interval:  162 QRS Duration: 124 QT Interval:  390 QTC Calculation: 477 R Axis:   78 Text Interpretation:  Normal sinus rhythm Septal infarct , age  undetermined Abnormal ECG Confirmed by BEATON  MD, ROBERT ([id number]) on  10/10/2015 5:51:55 PM      MDM   Final diagnoses:  Unstable angina (HCC)  Chest pain, unspecified chest pain type   59 year old gentleman with history of coronary artery disease status post multiple stent placement and history of cocaine use presented with chest pain at rest.  He had positive stress test within the past week ad was scheduled to discussed treatment options with his cardiologist in 2 days. Chest pain improved but didn't resolve with Nitroglycerin.  EKG denies any acute ischemic changes and initial troponin was negative.  He was started on heparin for unstable angina in his cardiologist was consulted for admission.   Olam Idler, MD 10/10/2015, 8:28 PM PGY-3, [place] R Ezzie Senat, MD 10/10/15 2028  Leonard Schwartz, MD 10/20/15 [phone]

## 2015-10-10 NOTE — ED Notes (Signed)
Pt is here with left sided chest pain that is crampy, reports sob, nausea, dizzy. 3 heart stents

## 2015-10-11 LAB — BASIC METABOLIC PANEL
Anion gap: 5 (ref 5–15)
BUN: 12 mg/dL (ref 6–20)
CHLORIDE: 109 mmol/L (ref 101–111)
CO2: 24 mmol/L (ref 22–32)
Calcium: 8.5 mg/dL — ABNORMAL LOW (ref 8.9–10.3)
Creatinine, Ser: 0.9 mg/dL (ref 0.61–1.24)
GFR calc Af Amer: >60 mL/min (ref >60)
GFR calc non Af Amer: >60 mL/min (ref >60)
GLUCOSE: 104 mg/dL — AB (ref 65–99)
POTASSIUM: 3.4 mmol/L — AB (ref 3.5–5.1)
SODIUM: 138 mmol/L (ref 135–145)

## 2015-10-11 LAB — LIPID PANEL
CHOL/HDL RATIO: 4.1 ratio
Cholesterol: 160 mg/dL (ref 0–200)
HDL: 39 mg/dL — AB (ref >40)
LDL Cholesterol: 113 mg/dL — ABNORMAL HIGH (ref 0–99)
Triglycerides: 39 mg/dL (ref <150)
VLDL: 8 mg/dL (ref 0–40)

## 2015-10-11 LAB — CBC
HEMATOCRIT: 33.6 % — AB (ref 39.0–52.0)
Hemoglobin: 11.5 g/dL — ABNORMAL LOW (ref 13.0–17.0)
MCH: 29.1 pg (ref 26.0–34.0)
MCHC: 34.2 g/dL (ref 30.0–36.0)
MCV: 85.1 fL (ref 78.0–100.0)
Platelets: 271 10*3/uL (ref 150–400)
RBC: 3.95 MIL/uL — ABNORMAL LOW (ref 4.22–5.81)
RDW: 16.4 % — AB (ref 11.5–15.5)
WBC: 6.2 10*3/uL (ref 4.0–10.5)

## 2015-10-11 LAB — TROPONIN I: Troponin I: <0.03 ng/mL (ref <0.031)

## 2015-10-11 LAB — HEPARIN LEVEL (UNFRACTIONATED)
Heparin Unfractionated: 0.2 IU/mL — ABNORMAL LOW (ref 0.30–0.70)
Heparin Unfractionated: 0.52 IU/mL (ref 0.30–0.70)
Heparin Unfractionated: 0.72 IU/mL — ABNORMAL HIGH (ref 0.30–0.70)

## 2015-10-11 MED ORDER — FUROSEMIDE 40 MG PO TABS
40.0000 mg | ORAL_TABLET | Freq: Every day | ORAL | Status: DC
  Administered 2015-10-11 – 2015-10-14 (×2): 40 mg via ORAL
  Filled 2015-10-11 (×4): qty 1

## 2015-10-11 MED ORDER — SODIUM CHLORIDE 0.9 % IV BOLUS (SEPSIS)
250.0000 mL | Freq: Once | INTRAVENOUS | Status: AC
  Administered 2015-10-11: 250 mL via INTRAVENOUS

## 2015-10-11 MED ORDER — LEVALBUTEROL HCL 1.25 MG/0.5ML IN NEBU
1.2500 mg | INHALATION_SOLUTION | Freq: Four times a day (QID) | RESPIRATORY_TRACT | Status: DC | PRN
  Administered 2015-10-13 (×2): 1.25 mg via RESPIRATORY_TRACT
  Filled 2015-10-11 (×2): qty 0.5

## 2015-10-11 MED ORDER — POTASSIUM CHLORIDE CRYS ER 20 MEQ PO TBCR
40.0000 meq | EXTENDED_RELEASE_TABLET | Freq: Once | ORAL | Status: AC
  Administered 2015-10-11: 40 meq via ORAL
  Filled 2015-10-11: qty 2

## 2015-10-11 MED ORDER — CETYLPYRIDINIUM CHLORIDE 0.05 % MT LIQD
7.0000 mL | Freq: Two times a day (BID) | OROMUCOSAL | Status: DC
  Administered 2015-10-11 – 2015-10-13 (×5): 7 mL via OROMUCOSAL

## 2015-10-11 MED ORDER — HEPARIN BOLUS VIA INFUSION
2000.0000 [IU] | Freq: Once | INTRAVENOUS | Status: AC
  Administered 2015-10-11: 2000 [IU] via INTRAVENOUS
  Filled 2015-10-11: qty 2000

## 2015-10-11 NOTE — Progress Notes (Signed)
ANTICOAGULATION CONSULT NOTE - Follow Up Consult  Pharmacy Consult for heparin Indication: ACS/STEMI  Allergies  Allergen Reactions  . Penicillins Itching    Patient Measurements: Height: 5' (152.4 cm) Weight: 138 lb 4.8 oz (62.732 kg) IBW/kg (Calculated) : 50 Heparin Dosing Weight: 62.7 kg   Vital Signs: Temp: 97.4 F (36.3 C) (11/26 1542) Temp Source: Oral (11/26 1542) BP: 109/83 mmHg (11/26 1640) Pulse Rate: 110 (11/26 1640)  Labs:  Recent Labs  10/10/15 1537  10/10/15 2138 10/11/15 0143 10/11/15 0232 10/11/15 0951 10/11/15 1642  HGB 12.9*  --  11.8*  --  11.5*  --   --   HCT 38.2*  --  35.1*  --  33.6*  --   --   PLT 291  --  259  --  271  --   --   APTT  --   --  112*  --   --   --   --   LABPROT  --   --  14.7  --   --   --   --   INR  --   --  1.13  --   --   --   --   HEPARINUNFRC  --   --   --  0.20*  --  0.52 0.72*  CREATININE 0.82  --  0.87  --  0.90  --   --   TROPONINI  --   < > <0.03  --  <0.03 <0.03  --   < > = values in this interval not displayed.  Estimated Creatinine Clearance: 69.7 mL/min (by C-G formula based on Cr of 0.9).   Medications:  Scheduled:  . antiseptic oral rinse  7 mL Mouth Rinse BID  . aspirin EC  81 mg Oral Daily  . carvedilol  3.125 mg Oral BID WC  . furosemide  40 mg Oral Daily  . montelukast  10 mg Oral QHS  . nitroGLYCERIN  0.5 inch Topical 3 times per day  . pantoprazole  40 mg Oral Q0600  . prasugrel  10 mg Oral Daily   Infusions:  . sodium chloride 10 mL/hr at 10/11/15 0653  . heparin 900 Units/hr (10/11/15 1606)    Assessment: 59 yo male with ACS is currently on supratherapeutic heparin.  Heparin level is 0.72.  Goal of Therapy:  Heparin level 0.3-0.7 units/ml Monitor platelets by anticoagulation protocol: Yes   Plan:  - reduce heparin to 800 units/hr - 6hr heparin level  Ved Martos, Tsz-Yin 10/11/2015,5:30 PM

## 2015-10-11 NOTE — Progress Notes (Signed)
[place] for Heparin Indication: ACS /STEMI  Allergies  Allergen Reactions  . Penicillins Itching    Patient Measurements: TBW 60.7 kg IBW 50 kg  Vital Signs: Temp: 97.5 F (36.4 C) (11/26 0914) Temp Source: Oral (11/26 0914) BP: 92/67 mmHg (11/26 0914) Pulse Rate: 88 (11/26 0914)  Labs:  Recent Labs  10/10/15 1537  10/10/15 2138 10/11/15 0143 10/11/15 0232 10/11/15 0951  HGB 12.9*  --  11.8*  --  11.5*  --   HCT 38.2*  --  35.1*  --  33.6*  --   PLT 291  --  259  --  271  --   APTT  --   --  112*  --   --   --   LABPROT  --   --  14.7  --   --   --   INR  --   --  1.13  --   --   --   HEPARINUNFRC  --   --   --  0.20*  --  0.52  CREATININE 0.82  --  0.87  --  0.90  --   TROPONINI  --   < > <0.03  --  <0.03 <0.03  < > = values in this interval not displayed.  Estimated Creatinine Clearance: 69.7 mL/min (by C-G formula based on Cr of 0.9).  Assessment 59 y.o. male with chest pain; R/o ACS. Pharmacy consulted to dose heparin. HL 0.52 this AM after 2000 unit bolus followed by rate increase to 900 units/hr. Currently therapeutic; will check a confirmatory HL in 6hr. Hgb 11.5, plt 271. No bleeding reported.   Goal of Therapy:  Heparin level 0.3-0.7 units/ml Monitor platelets by anticoagulation protocol: Yes   Plan:  Continue heparin gtt at 900 units/hr Check 6 hr HL Monitor daily HL, CBC, s/s of bleed  Stephens November, PharmD. PGY1 Resident  10/11/2015 11:27 AM

## 2015-10-11 NOTE — Progress Notes (Signed)
Subjective:  Patient denies any further chest pain or shortness of breath. Cardiac enzymes have been negative  Objective:  Vital Signs in the last 24 hours: Temp:  [97.5 F (36.4 C)-98.6 F (37 C)] 97.5 F (36.4 C) (11/26 0914) Pulse Rate:  [77-92] 88 (11/26 0914) Resp:  [18-26] 25 (11/26 0914) BP: (79-107)/(52-80) 92/67 mmHg (11/26 0914) SpO2:  [96 %-100 %] 98 % (11/26 0914) Weight:  [62.732 kg (138 lb 4.8 oz)] 62.732 kg (138 lb 4.8 oz) (11/26 0500)  Intake/Output from previous day:   Intake/Output from this shift:    Physical Exam: Neck: no adenopathy, no carotid bruit, no JVD and supple, symmetrical, trachea midline Lungs: Decreased breath sound left lung field and decreased breath sounds right base with faint expiratory wheezing Heart: regular rate and rhythm, S1, S2 normal and Soft systolic murmur noted Abdomen: soft, non-tender; bowel sounds normal; no masses,  no organomegaly Extremities: extremities normal, atraumatic, no cyanosis or edema  Lab Results:  Recent Labs  10/10/15 2138 10/11/15 0232  WBC 6.1 6.2  HGB 11.8* 11.5*  PLT 259 271    Recent Labs  10/10/15 2138 10/11/15 0232  NA 134* 138  K 3.1* 3.4*  CL 105 109  CO2 21* 24  GLUCOSE 163* 104*  BUN 12 12  CREATININE 0.87 0.90    Recent Labs  10/10/15 2138 10/11/15 0232  TROPONINI <0.03 <0.03   Hepatic Function Panel  Recent Labs  10/10/15 2138  PROT 6.0*  ALBUMIN 3.0*  AST 20  ALT 11*  ALKPHOS 60  BILITOT 0.2*    Recent Labs  10/11/15 0232  CHOL 160   No results for input(s): PROTIME in the last 72 hours.  Imaging: Imaging results have been reviewed and Dg Chest 2 View  10/10/2015  CLINICAL DATA:  Left-sided chest pain, shortness of breath, nausea and dizziness. Status post left pneumonectomy. EXAM: CHEST  2 VIEW COMPARISON:  Chest radiograph 07/03/2015 FINDINGS: Patient is status post left pneumonectomy. Coronary arterial stents are noted. Cardiomediastinal silhouette is  largely obscured by left postsurgical hydrothorax with stable appearance. There is no evidence of pleural effusion or pneumothorax on the right. There is mild increase of interstitial markings with lower lobe predominance. Vague airspace opacity is also seen in the right mid lung. No evidence of displaced rib fractures. Soft tissues are grossly normal. IMPRESSION: Increased interstitial markings of the right lung with lower lobe predominance. These findings may represent interstitial pulmonary edema or interstitial infiltrate. Subtle airspace opacity overlying right midlung. Follow-up to resolution is recommended. Stable changes from left pneumonectomy. Electronically Signed   By: Fidela Salisbury M.D.   On: 10/10/2015 14:38    Cardiac Studies:  Assessment/Plan:  Unstable angina. Positive nuclear stress test MI ruled out Coronary artery disease, history of multiple MRSA PCR in the past as above. Ischemic cardiomyopathy. Valvular heart disease Hypertension Hyperlipidemia Tobacco abuse. History of cocaine abuse. History of non-small cell CA of left lung status post left pneumonectomy in the past Plan Continue present management Will schedule for cardiac cath possible PTCA stenting for Monday  LOS: 1 day    [name] 10/11/2015, 10:36 AM

## 2015-10-11 NOTE — Progress Notes (Signed)
[place] for Heparin Indication: ACS /STEMI  Allergies  Allergen Reactions  . Penicillins Itching    Patient Measurements: TBW 60.7 kg IBW 50 kg  Vital Signs: Temp: 97.8 F (36.6 C) (11/25 2152) Temp Source: Oral (11/25 2152) BP: 99/59 mmHg (11/25 2152) Pulse Rate: 82 (11/25 2152)  Labs:  Recent Labs  10/10/15 1537 10/10/15 1832 10/10/15 2138 10/11/15 0143 10/11/15 0232  HGB 12.9*  --  11.8*  --  11.5*  HCT 38.2*  --  35.1*  --  33.6*  PLT 291  --  259  --  271  APTT  --   --  112*  --   --   LABPROT  --   --  14.7  --   --   INR  --   --  1.13  --   --   HEPARINUNFRC  --   --   --  0.20*  --   CREATININE 0.82  --  0.87  --   --   TROPONINI  --  <0.03 <0.03  --   --     CrCl cannot be calculated (Unknown ideal weight.).  Assessment 59 y.o. male with chest pain for heparin   Goal of Therapy:  Heparin level 0.3-0.7 units/ml Monitor platelets by anticoagulation protocol: Yes   Plan:  Heparin 2000 units IV bolus, then increase heparin 900 units/hr Check heparin level in 6 hours.   Phillis Knack, PharmD, BCPS  10/11/2015 3:11 AM

## 2015-10-12 LAB — BASIC METABOLIC PANEL
ANION GAP: 8 (ref 5–15)
BUN: 10 mg/dL (ref 6–20)
CALCIUM: 8.8 mg/dL — AB (ref 8.9–10.3)
CO2: 24 mmol/L (ref 22–32)
Chloride: 102 mmol/L (ref 101–111)
Creatinine, Ser: 0.85 mg/dL (ref 0.61–1.24)
GFR calc Af Amer: >60 mL/min (ref >60)
Glucose, Bld: 99 mg/dL (ref 65–99)
POTASSIUM: 4.1 mmol/L (ref 3.5–5.1)
SODIUM: 134 mmol/L — AB (ref 135–145)

## 2015-10-12 LAB — HEPARIN LEVEL (UNFRACTIONATED)
HEPARIN UNFRACTIONATED: 0.6 [IU]/mL (ref 0.30–0.70)
Heparin Unfractionated: 0.57 IU/mL (ref 0.30–0.70)

## 2015-10-12 LAB — CBC
HCT: 37.5 % — ABNORMAL LOW (ref 39.0–52.0)
Hemoglobin: 12.8 g/dL — ABNORMAL LOW (ref 13.0–17.0)
MCH: 28.9 pg (ref 26.0–34.0)
MCHC: 34.1 g/dL (ref 30.0–36.0)
MCV: 84.7 fL (ref 78.0–100.0)
PLATELETS: 269 10*3/uL (ref 150–400)
RBC: 4.43 MIL/uL (ref 4.22–5.81)
RDW: 16.3 % — ABNORMAL HIGH (ref 11.5–15.5)
WBC: 6.7 10*3/uL (ref 4.0–10.5)

## 2015-10-12 LAB — MAGNESIUM: MAGNESIUM: 1.7 mg/dL (ref 1.7–2.4)

## 2015-10-12 MED ORDER — SODIUM CHLORIDE 0.9 % IV SOLN: 250.0000 mL | INTRAVENOUS | Status: DC | PRN

## 2015-10-12 MED ORDER — MAGNESIUM HYDROXIDE 400 MG/5ML PO SUSP
30.0000 mL | Freq: Every day | ORAL | Status: DC | PRN
  Administered 2015-10-12: 30 mL via ORAL
  Filled 2015-10-12 (×2): qty 30

## 2015-10-12 MED ORDER — SODIUM CHLORIDE 0.9 % WEIGHT BASED INFUSION
1.0000 mL/kg/h | INTRAVENOUS | Status: DC
  Administered 2015-10-12 – 2015-10-13 (×2): 1 mL/kg/h via INTRAVENOUS

## 2015-10-12 MED ORDER — SODIUM CHLORIDE 0.9 % IJ SOLN: 3.0000 mL | INTRAMUSCULAR | Status: DC | PRN

## 2015-10-12 MED ORDER — SODIUM CHLORIDE 0.9 % IJ SOLN
3.0000 mL | Freq: Two times a day (BID) | INTRAMUSCULAR | Status: DC
  Administered 2015-10-13: 3 mL via INTRAVENOUS

## 2015-10-12 NOTE — Progress Notes (Signed)
[place] for heparin Indication: ACS/STEMI  Allergies  Allergen Reactions  . Penicillins Itching    Patient Measurements: Height: 5' (152.4 cm) Weight: 138 lb 4.8 oz (62.732 kg) IBW/kg (Calculated) : 50 Heparin Dosing Weight: 62.7 kg   Vital Signs: Temp: 97.7 F (36.5 C) (11/26 2000) Temp Source: Oral (11/26 1542) BP: 100/74 mmHg (11/26 2000) Pulse Rate: 89 (11/26 2000)  Labs:  Recent Labs  10/10/15 1537  10/10/15 2138  10/11/15 0232 10/11/15 0951 10/11/15 1642 10/11/15 2350  HGB 12.9*  --  11.8*  --  11.5*  --   --   --   HCT 38.2*  --  35.1*  --  33.6*  --   --   --   PLT 291  --  259  --  271  --   --   --   APTT  --   --  112*  --   --   --   --   --   LABPROT  --   --  14.7  --   --   --   --   --   INR  --   --  1.13  --   --   --   --   --   HEPARINUNFRC  --   --   --   < >  --  0.52 0.72* 0.60  CREATININE 0.82  --  0.87  --  0.90  --   --   --   TROPONINI  --   < > <0.03  --  <0.03 <0.03  --   --   < > = values in this interval not displayed.  Estimated Creatinine Clearance: 69.7 mL/min (by C-G formula based on Cr of 0.9).   Assessment: 59 y.o. male with chest pain for heparin   Goal of Therapy:  Heparin level 0.3-0.7 units/ml Monitor platelets by anticoagulation protocol: Yes   Plan:  Continue Heparin at current rate    Mariya Mottley, Bronson Curb 10/12/2015,12:24 AM

## 2015-10-12 NOTE — Progress Notes (Signed)
Patient resting comfortably, complained earlier of abdominal pain.  Gave prn Zofran, patient later requested Tylenol. Will continue to monitor, call light within reach.

## 2015-10-12 NOTE — Progress Notes (Signed)
[place] for heparin Indication: ACS/STEMI  Allergies  Allergen Reactions  . Penicillins Itching    Patient Measurements: Height: 5' (152.4 cm) Weight: 135 lb 6.4 oz (61.417 kg) IBW/kg (Calculated) : 50 Heparin Dosing Weight: 62.7 kg   Vital Signs: Temp: 98 F (36.7 C) (11/27 0500) BP: 90/64 mmHg (11/27 0500) Pulse Rate: 75 (11/27 0500)  Labs:  Recent Labs  10/10/15 2138  10/11/15 0232 10/11/15 0951 10/11/15 1642 10/11/15 2350 10/12/15 0324  HGB 11.8*  --  11.5*  --   --   --  12.8*  HCT 35.1*  --  33.6*  --   --   --  37.5*  PLT 259  --  271  --   --   --  269  APTT 112*  --   --   --   --   --   --   LABPROT 14.7  --   --   --   --   --   --   INR 1.13  --   --   --   --   --   --   HEPARINUNFRC  --   < >  --  0.52 0.72* 0.60 0.57  CREATININE 0.87  --  0.90  --   --   --  0.85  TROPONINI <0.03  --  <0.03 <0.03  --   --   --   < > = values in this interval not displayed.  Estimated Creatinine Clearance: 73.2 mL/min (by C-G formula based on Cr of 0.85).   Assessment: 59 y.o. male with chest pain on heparin per pharmacy. HL remains therapeutic at 0.57 with current rate of 800 units/hr. Hgb and plt stable at 12.8 and 269 respectively. No bleeding reported.   Goal of Therapy:  Heparin level 0.3-0.7 units/ml Monitor platelets by anticoagulation protocol: Yes   Plan:  Continue heparin gtt at 800 units/hr Daily HL Monitor CBC, s/sx of bleeding   Judieth Keens, PharmD. PGY1 Resident 10/12/2015,8:30 AM

## 2015-10-12 NOTE — Progress Notes (Signed)
Subjective:  Doing well denies any chest pain or shortness of breath.  Objective:  Vital Signs in the last 24 hours: Temp:  [97.4 F (36.3 C)-98 F (36.7 C)] 98 F (36.7 C) (11/27 0500) Pulse Rate:  [74-110] 74 (11/27 0845) Resp:  [12-28] 12 (11/27 0500) BP: (90-109)/(58-83) 101/68 mmHg (11/27 0845) SpO2:  [98 %-100 %] 99 % (11/27 0845) Weight:  [135 lb 6.4 oz (61.417 kg)] 135 lb 6.4 oz (61.417 kg) (11/27 0500)  Intake/Output from previous day: 11/26 0701 - 11/27 0700 In: 1837.3 [P.O.:1630; I.V.:207.3] Out: 3025 [Urine:3025] Intake/Output from this shift: Total I/O In: 240 [P.O.:240] Out: 150 [Urine:150]  Physical Exam: Neck: no adenopathy, no carotid bruit, no JVD and supple, symmetrical, trachea midline Lungs: clear to auscultation bilaterally Heart: regular rate and rhythm, S1, S2 normal and Soft systolic murmur noted Abdomen: soft, non-tender; bowel sounds normal; no masses,  no organomegaly Extremities: extremities normal, atraumatic, no cyanosis or edema  Lab Results:  Recent Labs  10/11/15 0232 10/12/15 0324  WBC 6.2 6.7  HGB 11.5* 12.8*  PLT 271 269    Recent Labs  10/11/15 0232 10/12/15 0324  NA 138 134*  K 3.4* 4.1  CL 109 102  CO2 24 24  GLUCOSE 104* 99  BUN 12 10  CREATININE 0.90 0.85    Recent Labs  10/11/15 0232 10/11/15 0951  TROPONINI <0.03 <0.03   Hepatic Function Panel  Recent Labs  10/10/15 2138  PROT 6.0*  ALBUMIN 3.0*  AST 20  ALT 11*  ALKPHOS 60  BILITOT 0.2*    Recent Labs  10/11/15 0232  CHOL 160   No results for input(s): PROTIME in the last 72 hours.  Imaging: Imaging results have been reviewed and Dg Chest 2 View  10/10/2015  CLINICAL DATA:  Left-sided chest pain, shortness of breath, nausea and dizziness. Status post left pneumonectomy. EXAM: CHEST  2 VIEW COMPARISON:  Chest radiograph 07/03/2015 FINDINGS: Patient is status post left pneumonectomy. Coronary arterial stents are noted. Cardiomediastinal  silhouette is largely obscured by left postsurgical hydrothorax with stable appearance. There is no evidence of pleural effusion or pneumothorax on the right. There is mild increase of interstitial markings with lower lobe predominance. Vague airspace opacity is also seen in the right mid lung. No evidence of displaced rib fractures. Soft tissues are grossly normal. IMPRESSION: Increased interstitial markings of the right lung with lower lobe predominance. These findings may represent interstitial pulmonary edema or interstitial infiltrate. Subtle airspace opacity overlying right midlung. Follow-up to resolution is recommended. Stable changes from left pneumonectomy. Electronically Signed   By: Fidela Salisbury M.D.   On: 10/10/2015 14:38    Cardiac Studies:  Assessment/Plan:  Unstable angina. Positive nuclear stress test MI ruled out Coronary artery disease, history of multiple vessel PCI in the past to LAD ramus and left circumflex. Ischemic cardiomyopathy. Valvular heart disease Hypertension Hyperlipidemia Tobacco abuse. History of cocaine abuse. History of non-small cell CA of left lung status post left pneumonectomy in the past Plan Continue present management Discussed with patient regarding left cardiac cath possible PTCA stenting its risk and benefits i.e. death MI stroke need for emergency CABG local vascular complications etc. and consents for PCI  LOS: 2 days    [name] 10/12/2015, 10:31 AM

## 2015-10-12 NOTE — Progress Notes (Signed)
Pt states he is supposed to take his lasix every day but he CAN NOT do that because it makes him put out "way too much urine".  Refused lasix '40mg'$  po dose today.  He did have a dose on 10/11/2015.  Educated pt on the importance of taking meds as prescribed.

## 2015-10-13 ENCOUNTER — Encounter (HOSPITAL_COMMUNITY): Payer: Self-pay | Admitting: Cardiology

## 2015-10-13 ENCOUNTER — Encounter (HOSPITAL_COMMUNITY)
Admission: EM | Disposition: A | Discharge status: Home / Self Care | Payer: Self-pay | Source: Home / Self Care | Attending: Cardiology

## 2015-10-13 HISTORY — PX: CARDIAC CATHETERIZATION: SHX172

## 2015-10-13 LAB — CBC
HEMATOCRIT: 34.1 % — AB (ref 39.0–52.0)
HEMOGLOBIN: 11.6 g/dL — AB (ref 13.0–17.0)
MCH: 28.9 pg (ref 26.0–34.0)
MCHC: 34 g/dL (ref 30.0–36.0)
MCV: 84.8 fL (ref 78.0–100.0)
Platelets: 243 10*3/uL (ref 150–400)
RBC: 4.02 MIL/uL — AB (ref 4.22–5.81)
RDW: 16.2 % — ABNORMAL HIGH (ref 11.5–15.5)
WBC: 6.5 10*3/uL (ref 4.0–10.5)

## 2015-10-13 LAB — POCT ACTIVATED CLOTTING TIME: ACTIVATED CLOTTING TIME: 147 s

## 2015-10-13 LAB — HEPARIN LEVEL (UNFRACTIONATED): Heparin Unfractionated: 0.36 IU/mL (ref 0.30–0.70)

## 2015-10-13 SURGERY — LEFT HEART CATH AND CORONARY ANGIOGRAPHY
Anesthesia: LOCAL

## 2015-10-13 MED ORDER — MIDAZOLAM HCL 2 MG/2ML IJ SOLN
INTRAMUSCULAR | Status: AC
  Filled 2015-10-13: qty 2

## 2015-10-13 MED ORDER — FENTANYL CITRATE (PF) 100 MCG/2ML IJ SOLN
INTRAMUSCULAR | Status: DC | PRN
  Administered 2015-10-13: 25 ug via INTRAVENOUS

## 2015-10-13 MED ORDER — IOHEXOL 350 MG/ML SOLN
INTRAVENOUS | Status: DC | PRN
  Administered 2015-10-13: 75 mL via INTRACARDIAC

## 2015-10-13 MED ORDER — LIDOCAINE HCL (PF) 1 % IJ SOLN
INTRAMUSCULAR | Status: DC | PRN
  Administered 2015-10-13: 13:00:00

## 2015-10-13 MED ORDER — SODIUM CHLORIDE 0.9 % IJ SOLN: 3.0000 mL | INTRAMUSCULAR | Status: DC | PRN

## 2015-10-13 MED ORDER — LIDOCAINE HCL (PF) 1 % IJ SOLN
INTRAMUSCULAR | Status: AC
  Filled 2015-10-13: qty 30

## 2015-10-13 MED ORDER — SODIUM CHLORIDE 0.9 % IV SOLN: 250.0000 mL | INTRAVENOUS | Status: DC | PRN

## 2015-10-13 MED ORDER — SODIUM CHLORIDE 0.9 % IJ SOLN
3.0000 mL | Freq: Two times a day (BID) | INTRAMUSCULAR | Status: DC
  Administered 2015-10-13 – 2015-10-14 (×2): 3 mL via INTRAVENOUS

## 2015-10-13 MED ORDER — MIDAZOLAM HCL 2 MG/2ML IJ SOLN
INTRAMUSCULAR | Status: DC | PRN
  Administered 2015-10-13: 1 mg via INTRAVENOUS

## 2015-10-13 MED ORDER — FENTANYL CITRATE (PF) 100 MCG/2ML IJ SOLN
INTRAMUSCULAR | Status: AC
  Filled 2015-10-13: qty 2

## 2015-10-13 MED ORDER — SODIUM CHLORIDE 0.9 % WEIGHT BASED INFUSION: 3.0000 mL/kg/h | INTRAVENOUS | Status: AC

## 2015-10-13 MED ORDER — HEPARIN (PORCINE) IN NACL 2-0.9 UNIT/ML-% IJ SOLN
INTRAMUSCULAR | Status: AC
  Filled 2015-10-13: qty 1000

## 2015-10-13 SURGICAL SUPPLY — 8 items
CATH INFINITI 5FR MULTPACK ANG (CATHETERS) ×2 IMPLANT
KIT HEART LEFT (KITS) ×2 IMPLANT
PACK CARDIAC CATHETERIZATION (CUSTOM PROCEDURE TRAY) ×2 IMPLANT
SHEATH PINNACLE 5F 10CM (SHEATH) ×2 IMPLANT
SYR MEDRAD MARK V 150ML (SYRINGE) ×2 IMPLANT
TRANSDUCER W/STOPCOCK (MISCELLANEOUS) ×2 IMPLANT
WIRE EMERALD 3MM-J .035X150CM (WIRE) ×2 IMPLANT
WIRE EMERALD ST .035X150CM (WIRE) IMPLANT

## 2015-10-13 NOTE — Progress Notes (Signed)
Subjective:  Denies any chest pain complaints of feeling tired and short of breath.  Objective:  Vital Signs in the last 24 hours: Temp:  [98 F (36.7 C)-98.4 F (36.9 C)] 98 F (36.7 C) (11/28 0757) Pulse Rate:  [77-91] 80 (11/28 1340) Resp:  [15-25] 21 (11/28 1340) BP: (84-101)/(55-73) 101/69 mmHg (11/28 1335) SpO2:  [95 %-100 %] 98 % (11/28 1340) Weight:  [137 lb 9.6 oz (62.415 kg)] 137 lb 9.6 oz (62.415 kg) (11/28 0500)  Intake/Output from previous day: 11/27 0701 - 11/28 0700 In: 800.2 [P.O.:600; I.V.:200.2] Out: 1775 [Urine:1775] Intake/Output from this shift: Total I/O In: 840.6 [I.V.:840.6] Out: 850 [Urine:850]  Physical Exam: Neck: no adenopathy, no carotid bruit, no JVD and supple, symmetrical, trachea midline Lungs: clear to auscultation bilaterally Heart: regular rate and rhythm, S1, S2 normal and 2/6 systolic murmur noted Abdomen: soft, non-tender; bowel sounds normal; no masses,  no organomegaly Extremities: extremities normal, atraumatic, no cyanosis or edema  Lab Results:  Recent Labs  10/12/15 0324 10/13/15 0403  WBC 6.7 6.5  HGB 12.8* 11.6*  PLT 269 243    Recent Labs  10/11/15 0232 10/12/15 0324  NA 138 134*  K 3.4* 4.1  CL 109 102  CO2 24 24  GLUCOSE 104* 99  BUN 12 10  CREATININE 0.90 0.85    Recent Labs  10/11/15 0232 10/11/15 0951  TROPONINI <0.03 <0.03   Hepatic Function Panel  Recent Labs  10/10/15 2138  PROT 6.0*  ALBUMIN 3.0*  AST 20  ALT 11*  ALKPHOS 60  BILITOT 0.2*    Recent Labs  10/11/15 0232  CHOL 160   No results for input(s): PROTIME in the last 72 hours.  Imaging: Imaging results have been reviewed and No results found.  Cardiac Studies:  Assessment/Plan:  Unstable angina. Positive nuclear stress test MI ruled out status post left cardiac cath noted to have occluded left circumflex and moderate in-stent LAD restenosis Coronary artery disease, history of multiple vessel PCI in the past to LAD  ramus and left circumflex. Ischemic cardiomyopathy. Valvular heart disease/severe mitral regurg Hypertension Hyperlipidemia Tobacco abuse. History of cocaine abuse. History of non-small cell CA of left lung status post left pneumonectomy in the past  Plan Continue present management Check 2-D echo check for MR check LV function  LOS: 3 days    [name] 10/13/2015, 5:05 PM

## 2015-10-13 NOTE — Care Management Important Message (Signed)
Important Message  Patient Details  Name: Ronald [name] MRN: [id number] Date of Birth: 1956-11-04   Medicare Important Message Given:  Yes    Nathen May 10/13/2015, 3:32 PM

## 2015-10-13 NOTE — H&P (View-Only) (Signed)
Subjective:  Patient denies any further chest pain or shortness of breath. Cardiac enzymes have been negative  Objective:  Vital Signs in the last 24 hours: Temp:  [97.5 F (36.4 C)-98.6 F (37 C)] 97.5 F (36.4 C) (11/26 0914) Pulse Rate:  [77-92] 88 (11/26 0914) Resp:  [18-26] 25 (11/26 0914) BP: (79-107)/(52-80) 92/67 mmHg (11/26 0914) SpO2:  [96 %-100 %] 98 % (11/26 0914) Weight:  [62.732 kg (138 lb 4.8 oz)] 62.732 kg (138 lb 4.8 oz) (11/26 0500)  Intake/Output from previous day:   Intake/Output from this shift:    Physical Exam: Neck: no adenopathy, no carotid bruit, no JVD and supple, symmetrical, trachea midline Lungs: Decreased breath sound left lung field and decreased breath sounds right base with faint expiratory wheezing Heart: regular rate and rhythm, S1, S2 normal and Soft systolic murmur noted Abdomen: soft, non-tender; bowel sounds normal; no masses,  no organomegaly Extremities: extremities normal, atraumatic, no cyanosis or edema  Lab Results:  Recent Labs  10/10/15 2138 10/11/15 0232  WBC 6.1 6.2  HGB 11.8* 11.5*  PLT 259 271    Recent Labs  10/10/15 2138 10/11/15 0232  NA 134* 138  K 3.1* 3.4*  CL 105 109  CO2 21* 24  GLUCOSE 163* 104*  BUN 12 12  CREATININE 0.87 0.90    Recent Labs  10/10/15 2138 10/11/15 0232  TROPONINI <0.03 <0.03   Hepatic Function Panel  Recent Labs  10/10/15 2138  PROT 6.0*  ALBUMIN 3.0*  AST 20  ALT 11*  ALKPHOS 60  BILITOT 0.2*    Recent Labs  10/11/15 0232  CHOL 160   No results for input(s): PROTIME in the last 72 hours.  Imaging: Imaging results have been reviewed and Dg Chest 2 View  10/10/2015  CLINICAL DATA:  Left-sided chest pain, shortness of breath, nausea and dizziness. Status post left pneumonectomy. EXAM: CHEST  2 VIEW COMPARISON:  Chest radiograph 07/03/2015 FINDINGS: Patient is status post left pneumonectomy. Coronary arterial stents are noted. Cardiomediastinal silhouette is  largely obscured by left postsurgical hydrothorax with stable appearance. There is no evidence of pleural effusion or pneumothorax on the right. There is mild increase of interstitial markings with lower lobe predominance. Vague airspace opacity is also seen in the right mid lung. No evidence of displaced rib fractures. Soft tissues are grossly normal. IMPRESSION: Increased interstitial markings of the right lung with lower lobe predominance. These findings may represent interstitial pulmonary edema or interstitial infiltrate. Subtle airspace opacity overlying right midlung. Follow-up to resolution is recommended. Stable changes from left pneumonectomy. Electronically Signed   By: Fidela Salisbury M.D.   On: 10/10/2015 14:38    Cardiac Studies:  Assessment/Plan:  Unstable angina. Positive nuclear stress test MI ruled out Coronary artery disease, history of multiple MRSA PCR in the past as above. Ischemic cardiomyopathy. Valvular heart disease Hypertension Hyperlipidemia Tobacco abuse. History of cocaine abuse. History of non-small cell CA of left lung status post left pneumonectomy in the past Plan Continue present management Will schedule for cardiac cath possible PTCA stenting for Monday  LOS: 1 day    [name] 10/11/2015, 10:36 AM

## 2015-10-13 NOTE — Progress Notes (Signed)
ANTICOAGULATION CONSULT NOTE - Follow Up Consult  Pharmacy Consult for Heparin Indication: ACS  Allergies  Allergen Reactions  . Penicillins Itching    Patient Measurements: Height: 5' (152.4 cm) Weight: 137 lb 9.6 oz (62.415 kg) IBW/kg (Calculated) : 50  Vital Signs: Temp: 98 F (36.7 C) (11/28 0757) Temp Source: Oral (11/28 0757) BP: 94/62 mmHg (11/28 0847) Pulse Rate: 79 (11/28 0757)  Labs:  Recent Labs  10/10/15 2138  10/11/15 0232 10/11/15 0951  10/11/15 2350 10/12/15 0324 10/13/15 0403  HGB 11.8*  --  11.5*  --   --   --  12.8* 11.6*  HCT 35.1*  --  33.6*  --   --   --  37.5* 34.1*  PLT 259  --  271  --   --   --  269 243  APTT 112*  --   --   --   --   --   --   --   LABPROT 14.7  --   --   --   --   --   --   --   INR 1.13  --   --   --   --   --   --   --   HEPARINUNFRC  --   < >  --  0.52  < > 0.60 0.57 0.36  CREATININE 0.87  --  0.90  --   --   --  0.85  --   TROPONINI <0.03  --  <0.03 <0.03  --   --   --   --   < > = values in this interval not displayed.  Estimated Creatinine Clearance: 73.7 mL/min (by C-G formula based on Cr of 0.85).   Assessment: 59 y.o. male with chest pain on heparin gtt for ACS / STEMI. Plan is to go to cath on 11/28. Last HL therapeutic at 0.36.  Goal of Therapy:  Heparin level 0.3-0.7 units/ml Monitor platelets by anticoagulation protocol: Yes   Plan:  Continue heparin gtt at 800 units/hr Monitor daily HL, CBC, s/s of bleed F/U s/p cath  Elenor Quinones, PharmD, [facility] Clinical Pharmacist Pager [phone] 10/13/2015 8:57 AM

## 2015-10-13 NOTE — Interval H&P Note (Signed)
Cath Lab Visit (complete for each Cath Lab visit)  Clinical Evaluation Leading to the Procedure:   ACS: Yes.    Non-ACS:    Anginal Classification: CCS IV  Anti-ischemic medical therapy: Maximal Therapy (2 or more classes of medications)  Non-Invasive Test Results: Intermediate-risk stress test findings: cardiac mortality 1-3%/year  Prior CABG: No previous CABG      History and Physical Interval Note:  10/13/2015 11:48 AM  Ronald [name]  has presented today for surgery, with the diagnosis of unstable angina  The various methods of treatment have been discussed with the patient and family. After consideration of risks, benefits and other options for treatment, the patient has consented to  Procedure(s): Left Heart Cath and Coronary Angiography (N/A) as a surgical intervention .  The patient's history has been reviewed, patient examined, no change in status, stable for surgery.  I have reviewed the patient's chart and labs.  Questions were answered to the patient's satisfaction.     [name]

## 2015-10-13 NOTE — Care Management Note (Signed)
Case Management Note  Patient Details  Name: Ronald [name] MRN: [id number] Date of Birth: 11-Dec-1955  Subjective/Objective:  Pt admitted for unstable angina. Plan for cardiac cath 10-13-15.                   Action/Plan: CM will continue to monitor for disposition needs.   Expected Discharge Date:                  Expected Discharge Plan:  Home/Self Care  In-House Referral:  NA  Discharge planning Services  CM Consult  Post Acute Care Choice:    Choice offered to:     DME Arranged:    DME Agency:     HH Arranged:    HH Agency:     Status of Service:  In process, will continue to follow  Medicare Important Message Given:    Date Medicare IM Given:    Medicare IM give by:    Date Additional Medicare IM Given:    Additional Medicare Important Message give by:     If discussed at [place] of Stay Meetings, dates discussed:    Additional Comments:  Bethena Roys, RN 10/13/2015, 10:03 AM

## 2015-10-13 NOTE — Progress Notes (Signed)
Site area: Right groin a 5 french arterial sheath was removed  Site Prior to Removal:  Level 0  Pressure Applied For 15 MINUTES    Minutes Beginning at 1320p  Manual:   Yes.    Patient Status During Pull:  stable  Post Pull Groin Site:  Level 0  Post Pull Instructions Given:  Yes.    Post Pull Pulses Present:  Yes.    Dressing Applied:  Yes.    Comments:  VS remain stable  During sheath pull.

## 2015-10-13 NOTE — Progress Notes (Signed)
UR Completed Elinda Bunten Graves-Bigelow, RN,BSN [phone]  

## 2015-10-14 ENCOUNTER — Other Ambulatory Visit (HOSPITAL_COMMUNITY): Payer: Self-pay

## 2015-10-14 LAB — CBC
HEMATOCRIT: 34.8 % — AB (ref 39.0–52.0)
Hemoglobin: 11.9 g/dL — ABNORMAL LOW (ref 13.0–17.0)
MCH: 29 pg (ref 26.0–34.0)
MCHC: 34.2 g/dL (ref 30.0–36.0)
MCV: 84.9 fL (ref 78.0–100.0)
Platelets: 242 10*3/uL (ref 150–400)
RBC: 4.1 MIL/uL — AB (ref 4.22–5.81)
RDW: 16.2 % — AB (ref 11.5–15.5)
WBC: 6.9 10*3/uL (ref 4.0–10.5)

## 2015-10-14 MED ORDER — ASPIRIN 81 MG PO TBEC: 81.0000 mg | DELAYED_RELEASE_TABLET | Freq: Every day | ORAL | Status: DC

## 2015-10-14 NOTE — Progress Notes (Signed)
Pt discharge teaching and instructions reviewed. Pt has no further questions. VSS. Pt discharging home with brother

## 2015-10-14 NOTE — Discharge Instructions (Signed)
Angina Pectoris Angina pectoris is a very bad feeling in the chest, neck, or arm. Your doctor may call it angina. There are four types of angina. Angina is caused by a lack of blood in the middle and thickest layer of the heart wall (myocardium). Angina may feel like a crushing or squeezing pain in the chest. It may feel like tightness or heavy pressure in the chest. Some people say it feels like gas, heartburn, or indigestion. Some people have symptoms other than pain. These include:  Shortness of breath.  Cold sweats.  Feeling sick to your stomach (nausea).  Feeling light-headed. Many women have chest discomfort and some of the other symptoms. However, women often have different symptoms, such as:  Feeling tired (fatigue).  Feeling nervous for no reason.  Feeling weak for no reason.  Dizziness or fainting. Women may have angina without any symptoms. HOME CARE  Take medicines only as told by your doctor.  Take care of other health issues as told by your doctor. These include:  High blood pressure (hypertension).  Diabetes.  Follow a heart-healthy diet. Your doctor can help you to choose healthy food options and make changes.  Talk to your doctor to learn more about healthy cooking methods and use them. These include:  Roasting.  Grilling.  Broiling.  Baking.  Poaching.  Steaming.  Stir-frying.  Follow an exercise program approved by your doctor.  Keep a healthy weight. Lose weight as told by your doctor.  Rest when you are tired.  Learn to manage stress.  Do not use any tobacco, such as cigarettes, chewing tobacco, or electronic cigarettes. If you need help quitting, ask your doctor.  If you drink alcohol, and your doctor says it is okay, limit yourself to no more than 1 drink per day. One drink equals 12 ounces of beer, 5 ounces of wine, or 1 ounces of hard liquor.  Stop illegal drug use.  Keep all follow-up visits as told by your doctor. This is  important. Do not take these medicines unless your doctor says that you can:  Nonsteroidal anti-inflammatory drugs (NSAIDs). These include:  Ibuprofen.  Naproxen.  Celecoxib.  Vitamin supplements that have vitamin A, vitamin E, or both.  Hormone therapy that contains estrogen with or without progestin. GET HELP RIGHT AWAY IF:  You have pain in your chest, neck, arm, jaw, stomach, or back that:  Lasts more than a few minutes.  Comes back.  Does not get better after you take medicine under your tongue (sublingual nitroglycerin).  You have any of these symptoms for no reason:  Gas, heartburn, or indigestion.  Sweating a lot.  Shortness of breath or trouble breathing.  Feeling sick to your stomach or throwing up.  Feeling more tired than usual.  Feeling nervous or worrying more than usual.  Feeling weak.  Diarrhea.  You are suddenly dizzy or light-headed.  You faint or pass out. These symptoms may be an emergency. Do not wait to see if the symptoms will go away. Get medical help right away. Call your local emergency services (911 in the [place] Do not drive yourself to the hospital.   This information is not intended to replace advice given to you by your health care provider. Make sure you discuss any questions you have with your health care provider.   Document Released: 04/19/2008 Document Revised: 03/18/2015 Document Reviewed: 03/05/2014 Elsevier Interactive Patient Education 2016 [employer] Coronary Angiogram A coronary angiogram, also called coronary angiography, is an X-ray procedure  used to look at the arteries in the heart. In this procedure, a dye (contrast dye) is injected through a long, hollow tube (catheter). The catheter is about the size of a piece of cooked spaghetti and is inserted through your groin, wrist, or arm. The dye is injected into each artery, and X-rays are then taken to show if there is a blockage in the arteries of your heart. LET  [facility] CARE PROVIDER KNOW ABOUT:  Any allergies you have, including allergies to shellfish or contrast dye.   All medicines you are taking, including vitamins, herbs, eye drops, creams, and over-the-counter medicines.   Previous problems you or members of your family have had with the use of anesthetics.   Any blood disorders you have.   Previous surgeries you have had.  History of kidney problems or failure.   Other medical conditions you have. RISKS AND COMPLICATIONS  Generally, a coronary angiogram is a safe procedure. However, problems can occur and include:  Allergic reaction to the dye.  Bleeding from the access site or other locations.  Kidney injury, especially in people with impaired kidney function.  Stroke (rare).  Heart attack (rare). BEFORE THE PROCEDURE   Do not eat or drink anything after midnight the night before the procedure or as directed by your health care provider.   Ask your health care provider about changing or stopping your regular medicines. This is especially important if you are taking diabetes medicines or blood thinners. PROCEDURE  You may be given a medicine to help you relax (sedative) before the procedure. This medicine is given through an intravenous (IV) access tube that is inserted into one of your veins.   The area where the catheter will be inserted will be washed and shaved. This is usually done in the groin but may be done in the fold of your arm (near your elbow) or in the wrist.   A medicine will be given to numb the area where the catheter will be inserted (local anesthetic).   The health care provider will insert the catheter into an artery. The catheter will be guided by using a special type of X-ray (fluoroscopy) of the blood vessel being examined.   A special dye will then be injected into the catheter, and X-rays will be taken. The dye will help to show where any narrowing or blockages are located in the heart  arteries.  AFTER THE PROCEDURE   If the procedure is done through the leg, you will be kept in bed lying flat for several hours. You will be instructed to not bend or cross your legs.  The insertion site will be checked frequently.   The pulse in your feet or wrist will be checked frequently.   Additional blood tests, X-rays, and an electrocardiogram may be done.    This information is not intended to replace advice given to you by your health care provider. Make sure you discuss any questions you have with your health care provider.   Document Released: 05/08/2003 Document Revised: 11/22/2014 Document Reviewed: 03/26/2013 Elsevier Interactive Patient Education 2016 Else[employer] Inc. Transient Ischemic Attack A transient ischemic attack (TIA) is a "warning stroke" that causes stroke-like symptoms. Unlike a stroke, a TIA does not cause permanent damage to the brain. The symptoms of a TIA can happen very fast and do not last long. It is important to know the symptoms of a TIA and what to do. This can help prevent a major stroke or death. CAUSES  A TIA is caused by a temporary blockage in an artery in the brain or neck (carotid artery). The blockage does not allow the brain to get the blood supply it needs and can cause different symptoms. The blockage can be caused by either:  A blood clot.  Fatty buildup (plaque) in a neck or brain artery. RISK FACTORS  High blood pressure (hypertension).  High cholesterol.  Diabetes mellitus.  Heart disease.  The buildup of plaque in the blood vessels (peripheral artery disease or atherosclerosis).  The buildup of plaque in the blood vessels that provide blood and oxygen to the brain (carotid artery stenosis).  An abnormal heart rhythm (atrial fibrillation).  Obesity.  Using any tobacco products, including cigarettes, chewing tobacco, or electronic cigarettes.  Taking oral contraceptives, especially in combination with using  tobacco.  Physical inactivity.  A diet high in fats, salt (sodium), and calories.  Excessive alcohol use.  Use of illegal drugs (especially cocaine and methamphetamine).  Being male.  Being African American.  Being over the age of 7 years.  Family history of stroke.  Previous history of blood clots, stroke, TIA, or heart attack.  Sickle cell disease. SIGNS AND SYMPTOMS  TIA symptoms are the same as a stroke but are temporary. These symptoms usually develop suddenly, or may be newly present upon waking from sleep:  Sudden weakness or numbness of the face, arm, or leg, especially on one side of the body.  Sudden trouble walking or difficulty moving arms or legs.  Sudden confusion.  Sudden personality changes.  Trouble speaking (aphasia) or understanding.  Difficulty swallowing.  Sudden trouble seeing in one or both eyes.  Double vision.  Dizziness.  Loss of balance or coordination.  Sudden severe headache with no known cause.  Trouble reading or writing.  Loss of bowel or bladder control.  Loss of consciousness. DIAGNOSIS  Your health care provider may be able to determine the presence or absence of a TIA based on your symptoms, history, and physical exam. CT scan of the brain is usually performed to help identify a TIA. Other tests may include:  Electrocardiography (ECG).  Continuous heart monitoring.  Echocardiography.  Carotid ultrasonography.  MRI.  A scan of the brain circulation.  Blood tests. TREATMENT  Since the symptoms of TIA are the same as a stroke, it is important to seek treatment as soon as possible. You may need a medicine to dissolve a blood clot (thrombolytic) if that is the cause of the TIA. This medicine cannot be given if too much time has passed. Treatment may also include:   Rest, oxygen, fluids through an IV tube, and medicines to thin the blood (anticoagulants).  Measures will be taken to prevent short-term and long-term  complications, including infection from breathing foreign material into the lungs (aspiration pneumonia), blood clots in the legs, and falls.  Procedures to either remove plaque in the carotid arteries or dilate carotid arteries that have narrowed due to plaque. Those procedures are:  Carotid endarterectomy.  Carotid angioplasty and stenting.  Medicines and diet may be used to address diabetes, high blood pressure, and other underlying risk factors. HOME CARE INSTRUCTIONS   Take medicines only as directed by your health care provider. Follow the directions carefully. Medicines may be used to control risk factors for a stroke. Be sure you understand all your medicine instructions.  You may be told to take aspirin or the anticoagulant warfarin. Warfarin needs to be taken exactly as instructed.  Taking too much or  too little warfarin is dangerous. Too much warfarin increases the risk of bleeding. Too little warfarin continues to allow the risk for blood clots. While taking warfarin, you will need to have regular blood tests to measure your blood clotting time. A PT blood test measures how long it takes for blood to clot. Your PT is used to calculate another value called an INR. Your PT and INR help your health care provider to adjust your dose of warfarin. The dose can change for many reasons. It is critically important that you take warfarin exactly as prescribed.  Many foods, especially foods high in vitamin K can interfere with warfarin and affect the PT and INR. Foods high in vitamin K include spinach, kale, broccoli, cabbage, collard and turnip greens, Brussels sprouts, peas, cauliflower, seaweed, and parsley, as well as beef and pork liver, green tea, and soybean oil. You should eat a consistent amount of foods high in vitamin K. Avoid major changes in your diet, or notify your health care provider before changing your diet. Arrange a visit with a dietitian to answer your questions.  Many  medicines can interfere with warfarin and affect the PT and INR. You must tell your health care provider about any and all medicines you take; this includes all vitamins and supplements. Be especially cautious with aspirin and anti-inflammatory medicines. Do not take or discontinue any prescribed or over-the-counter medicine except on the advice of your health care provider or pharmacist.  Warfarin can have side effects, such as excessive bruising or bleeding. You will need to hold pressure over cuts for longer than usual. Your health care provider or pharmacist will discuss other potential side effects.  Avoid sports or activities that may cause injury or bleeding.  Be careful when shaving, flossing your teeth, or handling sharp objects.  Alcohol can change the body's ability to handle warfarin. It is best to avoid alcoholic drinks or consume only very small amounts while taking warfarin. Notify your health care provider if you change your alcohol intake.  Notify your dentist or other health care providers before procedures.  Eat a diet that includes 5 or more servings of fruits and vegetables each day. This may reduce the risk of stroke. Certain diets may be prescribed to address high blood pressure, high cholesterol, diabetes, or obesity.  A diet low in sodium, saturated fat, trans fat, and cholesterol is recommended to manage high blood pressure.  A diet low in saturated fat, trans fat, and cholesterol, and high in fiber may control cholesterol levels.  A controlled-carbohydrate, controlled-sugar diet is recommended to manage diabetes.  A reduced-calorie diet that is low in sodium, saturated fat, trans fat, and cholesterol is recommended to manage obesity.  Maintain a healthy weight.  Stay physically active. It is recommended that you get at least 30 minutes of activity on most or all days.  Do not use any tobacco products, including cigarettes, chewing tobacco, or electronic  cigarettes. If you need help quitting, ask your health care provider.  Limit alcohol intake to no more than 1 drink per day for nonpregnant women and 2 drinks per day for men. One drink equals 12 ounces of beer, 5 ounces of wine, or 1 ounces of hard liquor.  Do not abuse drugs.  A safe home environment is important to reduce the risk of falls. Your health care provider may arrange for specialists to evaluate your home. Having grab bars in the bedroom and bathroom is often important. Your health care provider may  arrange for equipment to be used at home, such as raised toilets and a seat for the shower.  Follow all instructions for follow-up with your health care provider. This is very important. This includes any referrals and lab tests. Proper follow-up can prevent a stroke or another TIA from occurring. PREVENTION  The risk of a TIA can be decreased by appropriately treating high blood pressure, high cholesterol, diabetes, heart disease, and obesity, and by quitting smoking, limiting alcohol, and staying physically active. SEEK MEDICAL CARE IF:  You have personality changes.  You have difficulty swallowing.  You are seeing double.  You have dizziness.  You have a fever. SEEK IMMEDIATE MEDICAL CARE IF:  Any of the following symptoms may represent a serious problem that is an emergency. Do not wait to see if the symptoms will go away. Get medical help right away. Call your local emergency services (911 in [place] Do not drive yourself to the hospital.  You have sudden weakness or numbness of the face, arm, or leg, especially on one side of the body.  You have sudden trouble walking or difficulty moving arms or legs.  You have sudden confusion.  You have trouble speaking (aphasia) or understanding.  You have sudden trouble seeing in one or both eyes.  You have a loss of balance or coordination.  You have a sudden, severe headache with no known cause.  You have new chest pain or  an irregular heartbeat.  You have a partial or total loss of consciousness. MAKE SURE YOU:   Understand these instructions.  Will watch your condition.  Will get help right away if you are not doing well or get worse.   This information is not intended to replace advice given to you by your health care provider. Make sure you discuss any questions you have with your health care provider.   Document Released: 08/11/2005 Document Revised: 11/22/2014 Document Reviewed: 02/06/2014 Elsevier Interactive Patient Education [employer].

## 2015-10-14 NOTE — Discharge Summary (Signed)
Discharge summary dictated on 10/14/2015 dictation number is [phone]

## 2015-10-15 NOTE — Discharge Summary (Signed)
NAMEKESTON, SEEVER             ACCOUNT NO.:  [id number]  MEDICAL RECORD NO.:  [id number]  LOCATION:  [id number]                        FACILITY:  [place]  PHYSICIAN:  Allegra Lai. Terrence Dupont, M.D. DATE OF BIRTH:  02-26-1956  DATE OF ADMISSION:  10/10/2015 DATE OF DISCHARGE:  10/14/2015                              DISCHARGE SUMMARY   ADMITTING DIAGNOSES: 1. Unstable angina, positive nuclear stress test as outpatient, rule     out myocardial infarction. 2. Coronary artery disease, history of multiple PCIs in the past. 3. Ischemic cardiomyopathy. 4. Valvular heart disease. 5. Hypertension. 6. Hyperlipidemia. 7. Tobacco abuse. 8. History of cocaine abuse in the past. 9. History of non-small cell carcinoma of the lung, status post left     pneumonectomy in the past.  DISCHARGE DIAGNOSES: 1. Stable angina, myocardial infarction ruled out status post cardiac     catheterization, noted to have occluded small left circumflex and     moderate in-stent restenosis in LAD. 2. Coronary artery disease, history of multiple PCIs in the past. 3. Ischemic cardiomyopathy. 4. Valvular heart disease with severe mitral regurgitation. 5. Hypertension. 6. Hyperlipidemia. 7. Tobacco abuse. 8. History of cocaine abuse. 9. 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  DISCHARGE HOME MEDICATIONS: 1. Aspirin 81 mg 1 tablet daily. 2. Albuterol inhaler 2 puffs every 4 hours as needed. 3. Carvedilol 3.125 mg 1 tablet twice daily. 4. Lasix 40 mg daily. 5. Nitrostat sublingual p.r.n. 6. Prasugrel 10 mg daily. 7. Ramipril 1.25 mg 1 capsule daily. 8. Spironolactone 25 mg 1 tablet daily. 9. Singulair 10 mg daily.  DIET:  Low salt, low cholesterol diet.  Post cardiac cath instructions have been given.  FOLLOWUP:  Follow up with me in 1 week.  We will arrange for 2D echo as outpatient.  CONDITION AT DISCHARGE:  Stable.  BRIEF HISTORY AND HOSPITAL  COURSE:  Mr. Chalmers is a 59 year old male with past medical history significant for coronary artery disease, status post multivessel PCI in the past to LAD, left circumflex, and ramus branch; hypertension; valvular heart disease with severe mitral regurgitation, not the candidate for surgery; hyperlipidemia; history of recurrent congestive heart failure, secondary to systolic dysfunction/valvular heart disease; history of non-small cell CA of the left lung, status post left pneumonectomy in the past; tobacco abuse; history of cocaine abuse in the past.  He came to the ER complaining of retrosternal and left-sided chest pain described as pressure, tightness, grade 10/10 off and on lasting few minutes, relieves with rest.  States chest pain was associated with mild shortness of breath, nausea.  Due to recurrent chest pain since yesterday, the patient decided to come to the ED.  The patient had nuclear stress test on October 06, 2015, which showed mid and basal anterolateral wall ischemia with EF of 46%.  The patient received sublingual nitro and was started on IV heparin with relief of chest pain.  The patient presently denies any chest pain.  The patient had appointment to see in my office next week, but due to recurrent chest pain, decided to come to ED.  PHYSICAL EXAMINATION:  GENERAL:  He was  alert, awake, oriented x3. VITAL SIGNS:  Blood pressure was 98/75, pulse 90, he was afebrile. EYES:  Conjunctivae were pink. NECK:  Supple.  No JVD.  No bruit. LUNGS:  Clear to auscultation without rhonchi or rales. CARDIOVASCULAR:  S1, S2 normal.  There was 2/6 systolic murmur. ABDOMEN:  Soft.  Bowel sounds are present.  Nontender. EXTREMITIES:  There is no clubbing, cyanosis, or edema.  LABORATORY DATA:  His 4 sets of troponin-I were negative.  Sodium was 139, potassium 4.2, BUN 14, creatinine 0.82, glucose was 64.  Hemoglobin was 12.9, hematocrit 38.2, white count of 6.2.  BRIEF HOSPITAL  COURSE:  The patient was admitted to telemetry unit.  MI was ruled out by serial enzymes and EKG.  The patient subsequently underwent left cardiac cath with selective left and right coronary angiography as per procedure report.  The patient tolerated the procedure well.  Postprocedure, the patient did not have any episodes of chest pain during the hospital stay.  His groin is stable with no evidence of hematoma or bruit.  The patient initially was also scheduled for 2D echo, but wanted to get it done as outpatient.  The patient is ambulating in room without any problems.  The patient will be discharged home on above medications and will be followed up in my office in 1 week.     Allegra Lai. Terrence Dupont, M.D.     [facility]/MEDQ  D:  10/14/2015  T:  10/15/2015  Job:  [id number]

## 2016-01-27 ENCOUNTER — Other Ambulatory Visit: Payer: Self-pay | Admitting: Gastroenterology

## 2016-06-09 IMAGING — CR DG CHEST 2V
2 series · 2 of 2 positions shown · non-contrast
Comparison: Chest radiograph 07/03/2015

CLINICAL DATA: Left-sided chest pain, shortness of breath, nausea
and dizziness. Status post left pneumonectomy.

EXAM:
CHEST  2 VIEW

[chest pa]
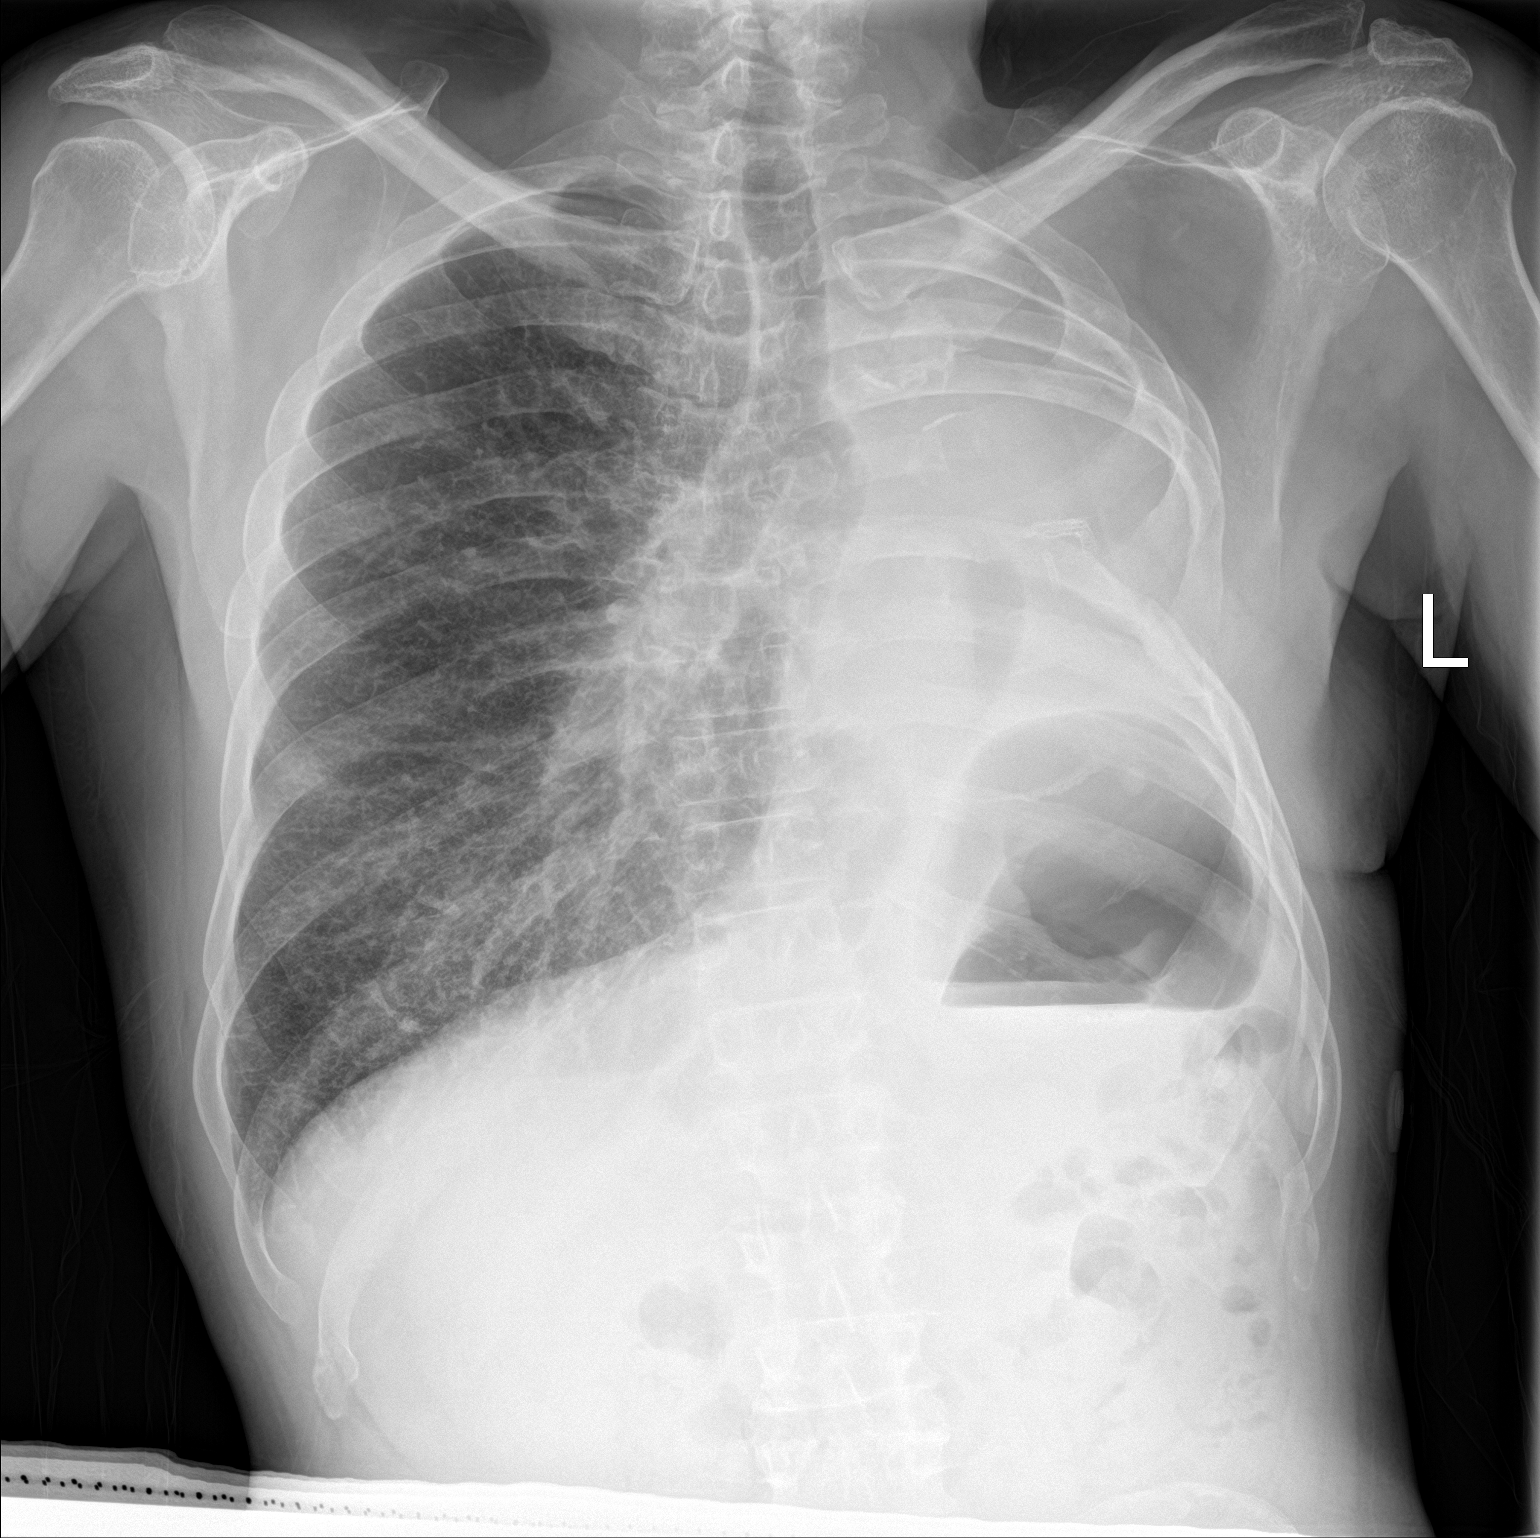

[chest lat]
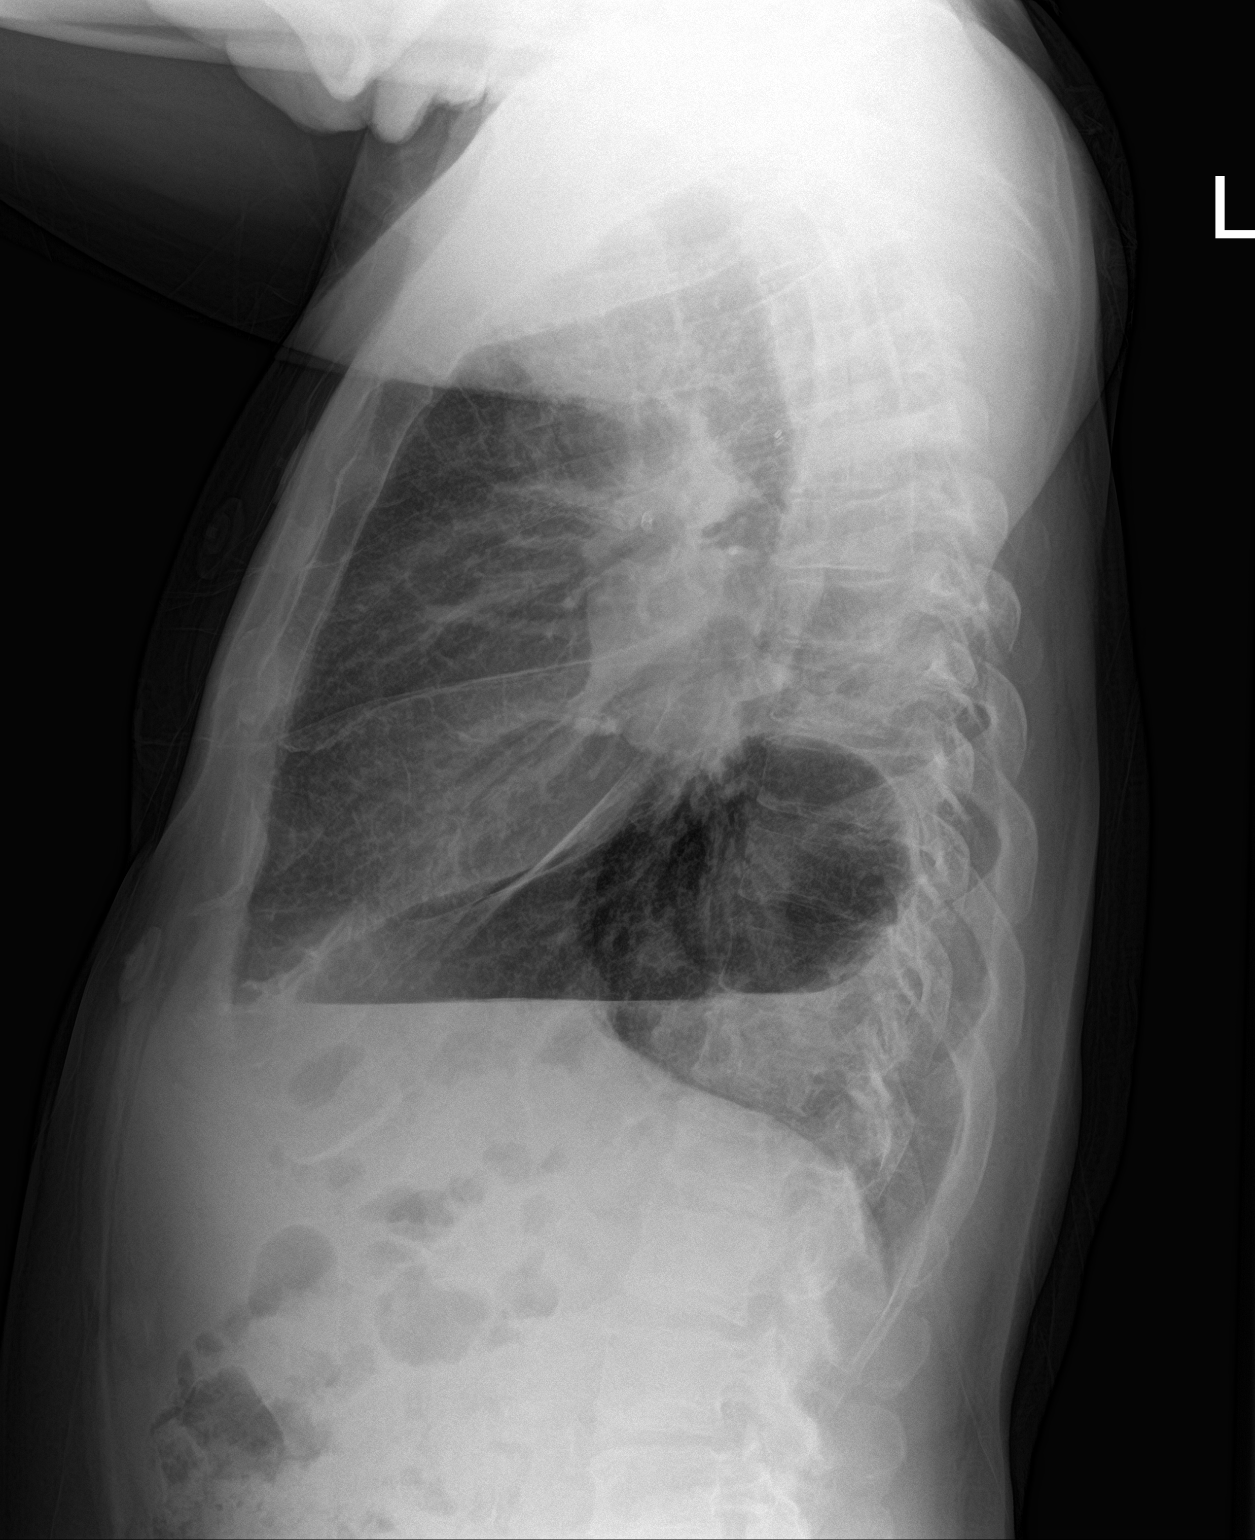

[2 of 2 positions shown; findings below may reference images not displayed]

FINDINGS: Patient is status post left pneumonectomy. Coronary arterial stents
are noted.

Cardiomediastinal silhouette is largely obscured by left
postsurgical hydrothorax with stable appearance.

There is no evidence of pleural effusion or pneumothorax on the
right. There is mild increase of interstitial markings with lower
lobe predominance. Vague airspace opacity is also seen in the right
mid lung.

No evidence of displaced rib fractures. Soft tissues are grossly
normal.
IMPRESSION: Increased interstitial markings of the right lung with lower lobe
predominance. These findings may represent interstitial pulmonary
edema or interstitial infiltrate.

Subtle airspace opacity overlying right midlung. Follow-up to
resolution is recommended.

Stable changes from left pneumonectomy.

## 2017-01-27 ENCOUNTER — Encounter (HOSPITAL_COMMUNITY): Payer: Self-pay | Admitting: Emergency Medicine

## 2017-01-27 ENCOUNTER — Emergency Department (HOSPITAL_COMMUNITY): Payer: Medicare Other

## 2017-01-27 DIAGNOSIS — Z902 Acquired absence of lung [part of]: Secondary | ICD-10-CM

## 2017-01-27 DIAGNOSIS — Z7982 Long term (current) use of aspirin: Secondary | ICD-10-CM

## 2017-01-27 DIAGNOSIS — J441 Chronic obstructive pulmonary disease with (acute) exacerbation: Secondary | ICD-10-CM | POA: Diagnosis present

## 2017-01-27 DIAGNOSIS — Z87891 Personal history of nicotine dependence: Secondary | ICD-10-CM

## 2017-01-27 DIAGNOSIS — Z9221 Personal history of antineoplastic chemotherapy: Secondary | ICD-10-CM

## 2017-01-27 DIAGNOSIS — J9601 Acute respiratory failure with hypoxia: Secondary | ICD-10-CM | POA: Diagnosis present

## 2017-01-27 DIAGNOSIS — Z79899 Other long term (current) drug therapy: Secondary | ICD-10-CM

## 2017-01-27 DIAGNOSIS — I2510 Atherosclerotic heart disease of native coronary artery without angina pectoris: Secondary | ICD-10-CM | POA: Diagnosis present

## 2017-01-27 DIAGNOSIS — I252 Old myocardial infarction: Secondary | ICD-10-CM

## 2017-01-27 DIAGNOSIS — R05 Cough: Secondary | ICD-10-CM | POA: Diagnosis not present

## 2017-01-27 DIAGNOSIS — K5900 Constipation, unspecified: Secondary | ICD-10-CM | POA: Diagnosis present

## 2017-01-27 DIAGNOSIS — I110 Hypertensive heart disease with heart failure: Secondary | ICD-10-CM | POA: Diagnosis present

## 2017-01-27 DIAGNOSIS — J189 Pneumonia, unspecified organism: Secondary | ICD-10-CM | POA: Diagnosis not present

## 2017-01-27 DIAGNOSIS — R778 Other specified abnormalities of plasma proteins: Secondary | ICD-10-CM | POA: Diagnosis present

## 2017-01-27 DIAGNOSIS — Z955 Presence of coronary angioplasty implant and graft: Secondary | ICD-10-CM

## 2017-01-27 DIAGNOSIS — Z85118 Personal history of other malignant neoplasm of bronchus and lung: Secondary | ICD-10-CM

## 2017-01-27 DIAGNOSIS — Z923 Personal history of irradiation: Secondary | ICD-10-CM

## 2017-01-27 DIAGNOSIS — I5042 Chronic combined systolic (congestive) and diastolic (congestive) heart failure: Secondary | ICD-10-CM | POA: Diagnosis present

## 2017-01-27 DIAGNOSIS — E785 Hyperlipidemia, unspecified: Secondary | ICD-10-CM | POA: Diagnosis present

## 2017-01-27 DIAGNOSIS — N179 Acute kidney failure, unspecified: Secondary | ICD-10-CM | POA: Diagnosis present

## 2017-01-27 LAB — BASIC METABOLIC PANEL
Anion gap: 10 (ref 5–15)
BUN: 21 mg/dL — ABNORMAL HIGH (ref 6–20)
CALCIUM: 9.5 mg/dL (ref 8.9–10.3)
CO2: 21 mmol/L — ABNORMAL LOW (ref 22–32)
CREATININE: 1.2 mg/dL (ref 0.61–1.24)
Chloride: 104 mmol/L (ref 101–111)
GFR calc non Af Amer: >60 mL/min (ref >60)
Glucose, Bld: 81 mg/dL (ref 65–99)
Potassium: 4.7 mmol/L (ref 3.5–5.1)
SODIUM: 135 mmol/L (ref 135–145)

## 2017-01-27 LAB — CBC
HCT: 37.2 % — ABNORMAL LOW (ref 39.0–52.0)
Hemoglobin: 12.7 g/dL — ABNORMAL LOW (ref 13.0–17.0)
MCH: 28.3 pg (ref 26.0–34.0)
MCHC: 34.1 g/dL (ref 30.0–36.0)
MCV: 82.9 fL (ref 78.0–100.0)
PLATELETS: 308 10*3/uL (ref 150–400)
RBC: 4.49 MIL/uL (ref 4.22–5.81)
RDW: 15.3 % (ref 11.5–15.5)
WBC: 7.8 10*3/uL (ref 4.0–10.5)

## 2017-01-27 LAB — I-STAT TROPONIN, ED: TROPONIN I, POC: 0.02 ng/mL (ref 0.00–0.08)

## 2017-01-27 MED ORDER — ALBUTEROL SULFATE (2.5 MG/3ML) 0.083% IN NEBU
5.0000 mg | INHALATION_SOLUTION | Freq: Once | RESPIRATORY_TRACT | Status: AC
Start: 2017-01-27 — End: 2017-01-27
  Administered 2017-01-27: 5 mg via RESPIRATORY_TRACT

## 2017-01-27 MED ORDER — ALBUTEROL SULFATE (2.5 MG/3ML) 0.083% IN NEBU
INHALATION_SOLUTION | RESPIRATORY_TRACT | Status: AC
  Filled 2017-01-27: qty 6

## 2017-01-27 NOTE — ED Triage Notes (Signed)
Pt having persistent cough and SOB, states he only had the right side lung because lung CA and he is having 3/10 right cp mostly on his lung with deep respiration.

## 2017-01-28 ENCOUNTER — Encounter (HOSPITAL_COMMUNITY): Payer: Self-pay | Admitting: General Practice

## 2017-01-28 ENCOUNTER — Inpatient Hospital Stay (HOSPITAL_COMMUNITY): Payer: Medicare Other

## 2017-01-28 ENCOUNTER — Inpatient Hospital Stay (HOSPITAL_COMMUNITY)
Admission: EM | Admit: 2017-01-28 | Discharge: 2017-01-31 | DRG: 193 | Disposition: A | Discharge status: Home / Self Care | Payer: Medicare Other | Attending: Internal Medicine | Admitting: Internal Medicine

## 2017-01-28 DIAGNOSIS — Z955 Presence of coronary angioplasty implant and graft: Secondary | ICD-10-CM | POA: Diagnosis not present

## 2017-01-28 DIAGNOSIS — J181 Lobar pneumonia, unspecified organism: Secondary | ICD-10-CM | POA: Diagnosis not present

## 2017-01-28 DIAGNOSIS — Z923 Personal history of irradiation: Secondary | ICD-10-CM | POA: Diagnosis not present

## 2017-01-28 DIAGNOSIS — Z85118 Personal history of other malignant neoplasm of bronchus and lung: Secondary | ICD-10-CM | POA: Diagnosis not present

## 2017-01-28 DIAGNOSIS — Z7982 Long term (current) use of aspirin: Secondary | ICD-10-CM | POA: Diagnosis not present

## 2017-01-28 DIAGNOSIS — E785 Hyperlipidemia, unspecified: Secondary | ICD-10-CM | POA: Diagnosis present

## 2017-01-28 DIAGNOSIS — I10 Essential (primary) hypertension: Secondary | ICD-10-CM | POA: Diagnosis not present

## 2017-01-28 DIAGNOSIS — Z9221 Personal history of antineoplastic chemotherapy: Secondary | ICD-10-CM | POA: Diagnosis not present

## 2017-01-28 DIAGNOSIS — I252 Old myocardial infarction: Secondary | ICD-10-CM | POA: Diagnosis not present

## 2017-01-28 DIAGNOSIS — R109 Unspecified abdominal pain: Secondary | ICD-10-CM

## 2017-01-28 DIAGNOSIS — N179 Acute kidney failure, unspecified: Secondary | ICD-10-CM | POA: Diagnosis present

## 2017-01-28 DIAGNOSIS — C801 Malignant (primary) neoplasm, unspecified: Secondary | ICD-10-CM | POA: Diagnosis not present

## 2017-01-28 DIAGNOSIS — R072 Precordial pain: Secondary | ICD-10-CM | POA: Diagnosis not present

## 2017-01-28 DIAGNOSIS — Z902 Acquired absence of lung [part of]: Secondary | ICD-10-CM | POA: Diagnosis not present

## 2017-01-28 DIAGNOSIS — Z79899 Other long term (current) drug therapy: Secondary | ICD-10-CM | POA: Diagnosis not present

## 2017-01-28 DIAGNOSIS — J189 Pneumonia, unspecified organism: Secondary | ICD-10-CM | POA: Diagnosis present

## 2017-01-28 DIAGNOSIS — J9601 Acute respiratory failure with hypoxia: Secondary | ICD-10-CM | POA: Diagnosis present

## 2017-01-28 DIAGNOSIS — R1030 Lower abdominal pain, unspecified: Secondary | ICD-10-CM | POA: Diagnosis not present

## 2017-01-28 DIAGNOSIS — R778 Other specified abnormalities of plasma proteins: Secondary | ICD-10-CM | POA: Diagnosis present

## 2017-01-28 DIAGNOSIS — Z87891 Personal history of nicotine dependence: Secondary | ICD-10-CM | POA: Diagnosis not present

## 2017-01-28 DIAGNOSIS — R05 Cough: Secondary | ICD-10-CM | POA: Diagnosis present

## 2017-01-28 DIAGNOSIS — J441 Chronic obstructive pulmonary disease with (acute) exacerbation: Secondary | ICD-10-CM | POA: Diagnosis present

## 2017-01-28 DIAGNOSIS — I5042 Chronic combined systolic (congestive) and diastolic (congestive) heart failure: Secondary | ICD-10-CM | POA: Diagnosis present

## 2017-01-28 DIAGNOSIS — K5900 Constipation, unspecified: Secondary | ICD-10-CM | POA: Diagnosis present

## 2017-01-28 DIAGNOSIS — I2510 Atherosclerotic heart disease of native coronary artery without angina pectoris: Secondary | ICD-10-CM | POA: Diagnosis present

## 2017-01-28 DIAGNOSIS — I110 Hypertensive heart disease with heart failure: Secondary | ICD-10-CM | POA: Diagnosis present

## 2017-01-28 LAB — BASIC METABOLIC PANEL
Anion gap: 9 (ref 5–15)
BUN: 19 mg/dL (ref 6–20)
CHLORIDE: 106 mmol/L (ref 101–111)
CO2: 19 mmol/L — AB (ref 22–32)
CREATININE: 0.94 mg/dL (ref 0.61–1.24)
Calcium: 9.3 mg/dL (ref 8.9–10.3)
GFR calc non Af Amer: >60 mL/min (ref >60)
Glucose, Bld: 230 mg/dL — ABNORMAL HIGH (ref 65–99)
POTASSIUM: 4.7 mmol/L (ref 3.5–5.1)
SODIUM: 134 mmol/L — AB (ref 135–145)

## 2017-01-28 LAB — INFLUENZA PANEL BY PCR (TYPE A & B)
INFLAPCR: NEGATIVE
INFLBPCR: NEGATIVE

## 2017-01-28 LAB — TROPONIN I: Troponin I: 0.03 ng/mL (ref <0.03)

## 2017-01-28 LAB — STREP PNEUMONIAE URINARY ANTIGEN: Strep Pneumo Urinary Antigen: NEGATIVE

## 2017-01-28 LAB — ECHOCARDIOGRAM COMPLETE
HEIGHTINCHES: 60 in
WEIGHTICAEL: 2208 [oz_av]

## 2017-01-28 LAB — I-STAT TROPONIN, ED: TROPONIN I, POC: 0 ng/mL (ref 0.00–0.08)

## 2017-01-28 LAB — I-STAT CG4 LACTIC ACID, ED: Lactic Acid, Venous: 0.78 mmol/L (ref 0.5–1.9)

## 2017-01-28 LAB — BRAIN NATRIURETIC PEPTIDE: B Natriuretic Peptide: 234.5 pg/mL — ABNORMAL HIGH (ref 0.0–100.0)

## 2017-01-28 LAB — HIV ANTIBODY (ROUTINE TESTING W REFLEX): HIV SCREEN 4TH GENERATION: NONREACTIVE

## 2017-01-28 MED ORDER — ASPIRIN 325 MG PO TABS
325.0000 mg | ORAL_TABLET | ORAL | Status: DC
  Administered 2017-01-31: 325 mg via ORAL
  Filled 2017-01-28: qty 1

## 2017-01-28 MED ORDER — IPRATROPIUM-ALBUTEROL 0.5-2.5 (3) MG/3ML IN SOLN
3.0000 mL | Freq: Four times a day (QID) | RESPIRATORY_TRACT | Status: DC
  Administered 2017-01-28 – 2017-01-29 (×6): 3 mL via RESPIRATORY_TRACT
  Filled 2017-01-28 (×6): qty 3

## 2017-01-28 MED ORDER — NITROGLYCERIN 0.4 MG SL SUBL: 0.4000 mg | SUBLINGUAL_TABLET | SUBLINGUAL | Status: DC | PRN

## 2017-01-28 MED ORDER — ONDANSETRON HCL 4 MG PO TABS: 4.0000 mg | ORAL_TABLET | Freq: Four times a day (QID) | ORAL | Status: DC | PRN

## 2017-01-28 MED ORDER — SODIUM CHLORIDE 0.9 % IV SOLN: Freq: Once | INTRAVENOUS | Status: DC

## 2017-01-28 MED ORDER — BUDESONIDE 0.25 MG/2ML IN SUSP
0.2500 mg | Freq: Two times a day (BID) | RESPIRATORY_TRACT | Status: DC
  Administered 2017-01-28 – 2017-01-31 (×7): 0.25 mg via RESPIRATORY_TRACT
  Filled 2017-01-28 (×7): qty 2

## 2017-01-28 MED ORDER — ZOLPIDEM TARTRATE 5 MG PO TABS
5.0000 mg | ORAL_TABLET | Freq: Once | ORAL | Status: AC
  Administered 2017-01-28: 5 mg via ORAL
  Filled 2017-01-28: qty 1

## 2017-01-28 MED ORDER — ARFORMOTEROL TARTRATE 15 MCG/2ML IN NEBU
15.0000 ug | INHALATION_SOLUTION | Freq: Two times a day (BID) | RESPIRATORY_TRACT | Status: DC
  Administered 2017-01-28 – 2017-01-31 (×7): 15 ug via RESPIRATORY_TRACT
  Filled 2017-01-28 (×7): qty 2

## 2017-01-28 MED ORDER — DEXTROSE 5 % IV SOLN
500.0000 mg | Freq: Every day | INTRAVENOUS | Status: DC
  Administered 2017-01-28 – 2017-01-29 (×2): 500 mg via INTRAVENOUS
  Filled 2017-01-28 (×2): qty 500

## 2017-01-28 MED ORDER — ACETAMINOPHEN 650 MG RE SUPP: 650.0000 mg | Freq: Four times a day (QID) | RECTAL | Status: DC | PRN

## 2017-01-28 MED ORDER — CARVEDILOL 3.125 MG PO TABS
3.1250 mg | ORAL_TABLET | Freq: Two times a day (BID) | ORAL | Status: DC
  Administered 2017-01-28 – 2017-01-29 (×4): 3.125 mg via ORAL
  Filled 2017-01-28 (×4): qty 1

## 2017-01-28 MED ORDER — MONTELUKAST SODIUM 10 MG PO TABS
10.0000 mg | ORAL_TABLET | Freq: Two times a day (BID) | ORAL | Status: DC
  Administered 2017-01-28 – 2017-01-31 (×7): 10 mg via ORAL
  Filled 2017-01-28 (×7): qty 1

## 2017-01-28 MED ORDER — IPRATROPIUM-ALBUTEROL 0.5-2.5 (3) MG/3ML IN SOLN
3.0000 mL | Freq: Once | RESPIRATORY_TRACT | Status: AC
  Administered 2017-01-28: 3 mL via RESPIRATORY_TRACT
  Filled 2017-01-28: qty 3

## 2017-01-28 MED ORDER — ONDANSETRON HCL 4 MG/2ML IJ SOLN: 4.0000 mg | Freq: Four times a day (QID) | INTRAMUSCULAR | Status: DC | PRN

## 2017-01-28 MED ORDER — GUAIFENESIN ER 600 MG PO TB12
600.0000 mg | ORAL_TABLET | Freq: Two times a day (BID) | ORAL | Status: DC
  Administered 2017-01-28 – 2017-01-30 (×6): 600 mg via ORAL
  Filled 2017-01-28 (×6): qty 1

## 2017-01-28 MED ORDER — IPRATROPIUM BROMIDE 0.02 % IN SOLN
1.0000 mg | Freq: Once | RESPIRATORY_TRACT | Status: AC
  Administered 2017-01-28: 1 mg via RESPIRATORY_TRACT
  Filled 2017-01-28: qty 5

## 2017-01-28 MED ORDER — METHYLPREDNISOLONE SODIUM SUCC 125 MG IJ SOLR
60.0000 mg | Freq: Four times a day (QID) | INTRAMUSCULAR | Status: DC
  Administered 2017-01-28 – 2017-01-30 (×7): 60 mg via INTRAVENOUS
  Filled 2017-01-28 (×7): qty 2

## 2017-01-28 MED ORDER — ALBUTEROL (5 MG/ML) CONTINUOUS INHALATION SOLN
10.0000 mg/h | INHALATION_SOLUTION | Freq: Once | RESPIRATORY_TRACT | Status: AC
  Administered 2017-01-28: 10 mg/h via RESPIRATORY_TRACT
  Filled 2017-01-28: qty 20

## 2017-01-28 MED ORDER — METHYLPREDNISOLONE SODIUM SUCC 125 MG IJ SOLR
60.0000 mg | Freq: Three times a day (TID) | INTRAMUSCULAR | Status: DC
  Administered 2017-01-28: 60 mg via INTRAVENOUS
  Filled 2017-01-28: qty 2

## 2017-01-28 MED ORDER — PRASUGREL HCL 10 MG PO TABS
10.0000 mg | ORAL_TABLET | Freq: Every day | ORAL | Status: DC
  Administered 2017-01-28 – 2017-01-31 (×4): 10 mg via ORAL
  Filled 2017-01-28 (×4): qty 1

## 2017-01-28 MED ORDER — DEXTROSE 5 % IV SOLN
1.0000 g | INTRAVENOUS | Status: DC
  Administered 2017-01-28 – 2017-01-30 (×3): 1 g via INTRAVENOUS
  Filled 2017-01-28 (×3): qty 10

## 2017-01-28 MED ORDER — ENOXAPARIN SODIUM 40 MG/0.4ML ~~LOC~~ SOLN
40.0000 mg | Freq: Every day | SUBCUTANEOUS | Status: DC
  Administered 2017-01-28 – 2017-01-30 (×3): 40 mg via SUBCUTANEOUS
  Filled 2017-01-28 (×4): qty 0.4

## 2017-01-28 MED ORDER — ACETAMINOPHEN 325 MG PO TABS: 650.0000 mg | ORAL_TABLET | Freq: Four times a day (QID) | ORAL | Status: DC | PRN

## 2017-01-28 MED ORDER — METHYLPREDNISOLONE SODIUM SUCC 125 MG IJ SOLR: 60.0000 mg | Freq: Three times a day (TID) | INTRAMUSCULAR | Status: DC

## 2017-01-28 MED ORDER — DEXTROSE 5 % IV SOLN
1.0000 g | Freq: Once | INTRAVENOUS | Status: AC
  Administered 2017-01-28: 1 g via INTRAVENOUS
  Filled 2017-01-28: qty 10

## 2017-01-28 MED ORDER — DEXTROSE 5 % IV SOLN
500.0000 mg | Freq: Once | INTRAVENOUS | Status: AC
  Administered 2017-01-28: 500 mg via INTRAVENOUS
  Filled 2017-01-28: qty 500

## 2017-01-28 MED ORDER — IPRATROPIUM-ALBUTEROL 0.5-2.5 (3) MG/3ML IN SOLN: 3.0000 mL | RESPIRATORY_TRACT | Status: DC | PRN

## 2017-01-28 MED ORDER — METHYLPREDNISOLONE SODIUM SUCC 125 MG IJ SOLR
125.0000 mg | Freq: Once | INTRAMUSCULAR | Status: AC
  Administered 2017-01-28: 125 mg via INTRAVENOUS
  Filled 2017-01-28: qty 2

## 2017-01-28 MED ORDER — FUROSEMIDE 40 MG PO TABS
40.0000 mg | ORAL_TABLET | Freq: Two times a day (BID) | ORAL | Status: DC
  Administered 2017-01-28 – 2017-01-31 (×7): 40 mg via ORAL
  Filled 2017-01-28 (×7): qty 1

## 2017-01-28 NOTE — ED Notes (Signed)
Pt called out stating that the breathing treatment was "cutting off my breathing."  Pt appeared very uncomfortable and more SOB than before.  Respiratory informed, they will be here soon.  Breathing treatment stopped.

## 2017-01-28 NOTE — Evaluation (Signed)
Occupational Therapy Evaluation Patient Details Name: Ronald [name] MRN: [id number] DOB: 24-Jan-1956 Today's Date: 01/28/2017    History of Present Illness Patient is a 61 yo male admitted 01/28/17 with worsening productive cough.  Patient with acute respiratory failure with hypoxia due to COPD, CAP, tachycardia.     PMH:  HTN, HLD, CAD, MI, s/p PCI, COPD, lung CA s/p Lt pneumonectomy, chemo and radiation 2004.   Clinical Impression   Pt reports having assist for LB ADL and all IADL of his aide, prior to admission. He has poor activity tolerance and requires multiple rest breaks during ambulation and ADL typically. Pt with dyspnea with minimal exertion and requires supervision for safety with OOB activity. Pt receptive to use of a shower seat at home. Will educate in use of AE for LB ADL while here in the hospital, but pt may choose to continue with the help of his aide.   Follow Up Recommendations  No OT follow up    Equipment Recommendations  Tub/shower seat    Recommendations for Other Services       Precautions / Restrictions Precautions Precautions: None Precaution Comments: On O2 Restrictions Weight Bearing Restrictions: No      Mobility Bed Mobility Overal bed mobility: Modified Independent             General bed mobility comments: Increased time  Transfers Overall transfer level: Needs assistance Equipment used: None Transfers: Sit to/from Stand Sit to Stand: Supervision         General transfer comment: stood with supervision for safety, no physical assist    Balance Overall balance assessment: Needs assistance Sitting-balance support: No upper extremity supported;Feet supported Sitting balance-Leahy Scale: Good     Standing balance support: No upper extremity supported Standing balance-Leahy Scale: Fair                              ADL Overall ADL's : Needs assistance/impaired Eating/Feeding: Independent;Bed level   Grooming:  Wash/dry hands;Wash/dry face;Set up;Sitting   Upper Body Bathing: Minimal assistance;Sitting   Lower Body Bathing: Moderate assistance;Sit to/from stand Lower Body Bathing Details (indicate cue type and reason): recommended long handled bath sponge Upper Body Dressing : Set up;Sitting   Lower Body Dressing: Moderate assistance;Sit to/from stand Lower Body Dressing Details (indicate cue type and reason): recommended sock aide and reacher Toilet Transfer: Copy Details (indicate cue type and reason): stood to use urinal Toileting- Clothing Manipulation and Hygiene: Supervision/safety;Sit to/from stand         General ADL Comments: Unsure of pt's interest in AE for LB ADL. Will educate in availability, but pt may choose to rely on his aide.     Vision Patient Visual Report: No change from baseline       Perception     Praxis      Pertinent Vitals/Pain Pain Assessment: No/denies pain     Hand Dominance Right   Extremity/Trunk Assessment Upper Extremity Assessment Upper Extremity Assessment: Overall WFL for tasks assessed   Lower Extremity Assessment Lower Extremity Assessment: Generalized weakness   Cervical / Trunk Assessment Cervical / Trunk Assessment: Normal   Communication Communication Communication: Expressive difficulties (difficult to understand at times)   Cognition Arousal/Alertness: Awake/alert Behavior During Therapy: WFL for tasks assessed/performed Overall Cognitive Status: Within Functional Limits for tasks assessed                     General Comments  Exercises       Shoulder Instructions      Home Living Family/patient expects to be discharged to:: Private residence Living Arrangements: Other relatives (son and son's girlfriend) Available Help at Discharge: Family;Personal care attendant;Available 24 hours/day (Aide 7 days/week, 2.5 hours/day) Type of Home: Apartment Home Access: Stairs to  enter [employer] of Steps: 1 Entrance Stairs-Rails: None Home Layout: One level     Bathroom Shower/Tub: Teacher, early years/pre: Standard     Home Equipment: None          Prior Functioning/Environment Level of Independence: Independent;Needs assistance  Gait / Transfers Assistance Needed: Ambulates independently.  Has to stop frequently due to SOB. ADL's / Homemaking Assistance Needed: Aide assists with bathing and dressing.  Aide and family assist with meal prep and housekeeping.            OT Problem List: Decreased strength;Impaired balance (sitting and/or standing);Cardiopulmonary status limiting activity      OT Treatment/Interventions: Self-care/ADL training;Energy conservation;DME and/or AE instruction;Patient/family education    OT Goals(Current goals can be found in the care plan section) Acute Rehab OT Goals Patient Stated Goal: To get better. To return home. OT Goal Formulation: With patient Time For Goal Achievement: 02/04/17 Potential to Achieve Goals: Good ADL Goals Pt Will Perform Grooming: with modified independence;standing Pt Will Perform Lower Body Bathing: with modified independence;with adaptive equipment;sit to/from stand Pt Will Perform Lower Body Dressing: with modified independence;with adaptive equipment;sit to/from stand Pt Will Transfer to Toilet: with modified independence;ambulating;regular height toilet Pt Will Perform Toileting - Clothing Manipulation and hygiene: with modified independence;sit to/from stand Pt Will Perform Tub/Shower Transfer: Tub transfer;with supervision;ambulating;shower seat Additional ADL Goal #1: Pt will generalize energy conservation and breathing techniques in ADL and mobility independently.  OT Frequency: Min 2X/week   Barriers to D/C:            Co-evaluation              End of Session Equipment Utilized During Treatment: Oxygen (2L)  Activity Tolerance: Patient limited by  fatigue Patient left: in bed;with call bell/phone within reach;with nursing/sitter in room (RT in room)  OT Visit Diagnosis: Muscle weakness (generalized) (M62.81) (decreased activity tolerance)                ADL either performed or assessed with clinical judgement  Time: 6812-7517 OT Time Calculation (min): 17 min Charges:  OT General Charges $OT Visit: 1 Procedure OT Evaluation $OT Eval Moderate Complexity: 1 Procedure G-Codes:     Malka So 01/28/2017, 4:34 PM  [phone]

## 2017-01-28 NOTE — ED Notes (Signed)
Per provider, pt given [place] sandwich and sprite

## 2017-01-28 NOTE — Progress Notes (Signed)
  Echocardiogram 2D Echocardiogram has been performed.  Ronald [name] 01/28/2017, 12:48 PM

## 2017-01-28 NOTE — ED Notes (Signed)
Admitting provider bedside 

## 2017-01-28 NOTE — Progress Notes (Signed)
CRITICAL VALUE ALERT  Critical value received:  Troponin 0.03  Date of notification:  01/28/17  Time of notification:  9242  Critical value read back:Yes.    Nurse who received alert:  Emiliano Dyer  MD notified (1st page):  Dr. Tana Coast  Time of first page:  0840  MD notified (2nd page):  Time of second page:  Responding MD:  Dr. Tana Coast  Time MD responded:  [phone]

## 2017-01-28 NOTE — Progress Notes (Signed)
CAT STARTED

## 2017-01-28 NOTE — ED Provider Notes (Signed)
TIME SEEN: 1:18 AM By signing my name below, I, Arianna Nassar, attest that this documentation has been prepared under the direction and in the presence of [employer], DO.  Electronically Signed: Julien Nordmann, ED Scribe. 01/28/17. 1:26 AM.  CHIEF COMPLAINT:  Chief Complaint  Patient presents with  . Cough  . Chest Pain    mostly on his lung when he takes deep breath     HPI:  HPI Comments: Ronald [name] is a 61 y.o. male who has a PMhx of MI x 3, COPD, CHF who presents to the Emergency Department complaining of moderate, gradual worsening productive cough that brings up yellow sputum x 1 week. He reports associated generalized fatigue, subjective fever and chills. Pt further notes constant right sided chest pain due to only having "one lung". Pt is short of breath at baseline but reports that it has been progressively worse than usual. He expresses increased pain with deep inspirations. He notes having a left pneumoectomy in 2004 after having lung cancer with chemotherapy and radiation therapy. Pt is not on any home O2. He denies dizziness or light-headedness.    Echo 2013:  Study Conclusions  - Left ventricle: The cavity size was mildly dilated. Systolic function was moderately to severely reduced. The estimated ejection fraction was in the range of 30% to 35%. - Aortic valve: Mild regurgitation. - Mitral valve: Severe regurgitation. - Left atrium: The atrium was mildly dilated. - Atrial septum: No defect or patent foramen ovale was identified. - Pulmonary arteries: Systolic pressure was moderately increased. PA peak pressure: 30m Hg (S). Transthoracic echocardiography. M-mode, complete 2D, spectral Doppler, and color Doppler. Height: Height: 152.4cm. Height: 60in. Weight: Weight: 64kg. Weight: 140.8lb. Body mass index: BMI: 27.6kg/m^2. Body surface area:  BSA: 1.661m. Blood pressure:   98/64. Patient status: Inpatient. Location:  Bedside.   Cath 10/13/15:   Mid RCA lesion, 30% stenosed.  Ramus-1 lesion, 20% stenosed.  Mid Cx to Dist Cx lesion, 100% stenosed. The lesion was previously treated with a drug-eluting stent greater than two years ago.  Mid LAD to Dist LAD lesion, 50% stenosed.  Mid LAD lesion, 60% stenosed. The lesion was previously treated with a drug-eluting stent greater than two years ago.     ROS: See HPI Constitutional: no fever  Eyes: no drainage  ENT: no runny nose   Cardiovascular:  (+) chest pain  Resp: (+) SOB  GI: no vomiting GU: no dysuria Integumentary: no rash  Allergy: no hives  Musculoskeletal: no leg swelling  Neurological: no slurred speech ROS otherwise negative  PAST MEDICAL HISTORY/PAST SURGICAL HISTORY:  Past Medical History:  Diagnosis Date  . CH[place] (congestive heart failure) [place]  . [place] disease[place]  . Coronary artery disease   . Hypertension   . lung ca dx'd 2005   chemo/xrt comp 2005, lung ca  . Myocardial infarction   . Shortness of breath     MEDICATIONS:  Prior to Admission medications   Medication Sig Start Date End Date Taking? Authorizing Provider  albuterol (PROVENTIL HFA;VENTOLIN HFA) 108 (90 BASE) MCG/ACT inhaler Inhale 2 puffs into the lungs every 4 (four) hours as needed for wheezing or shortness of breath. 10/14/13   Jarelly Rinck N Crit Obremski, DO  aspirin EC 81 MG EC tablet Take 1 tablet (81 mg total) by mouth daily. 10/14/15   MoCharolette ForwardMD  carvedilol (COREG) 3.125 MG tablet Take 1 tablet (3.125 mg total) by mouth 2 (two) times daily with  a meal. 07/06/15   Orson Eva, MD  furosemide (LASIX) 40 MG tablet Take 1 tablet (40 mg total) by mouth 2 (two) times daily. Twice a day for 3 days, then back to normal dosing. 05/02/15   Benjamine Mola, FNP  montelukast (SINGULAIR) 10 MG tablet Take 1 tablet (10 mg total) by mouth at bedtime. Patient taking differently: Take 10 mg by mouth every morning.  05/02/15   Benjamine Mola, FNP  nitroGLYCERIN (NITROSTAT) 0.4 MG SL tablet Place 1 tablet (0.4 mg total) under the tongue every 5 (five) minutes x 3 doses as needed for chest pain. 11/01/13   [name], MD  prasugrel (EFFIENT) 10 MG TABS tablet Take 1 tablet (10 mg total) by mouth daily. 07/06/15   Orson Eva, MD  ramipril (ALTACE) 1.25 MG capsule Take 1 capsule (1.25 mg total) by mouth daily. 07/06/15 01/02/17  Orson Eva, MD  spironolactone (ALDACTONE) 25 MG tablet Take 1 tablet (25 mg total) by mouth daily. 07/06/15   Orson Eva, MD    ALLERGIES:  Allergies  Allergen Reactions  . Penicillins Itching    SOCIAL HISTORY:  Social History  Substance Use Topics  . Smoking status: Former Smoker    Types: Cigarettes    Quit date: 09/30/2013  . Smokeless tobacco: Never Used  . Alcohol use No     Comment: former    FAMILY HISTORY: Family History  Problem Relation Age of Onset  . Hypertension Other   . Diabetes Other     EXAM: BP (!) 100/51 (BP Location: Right Arm)   Pulse 65   Temp 98.2 F (36.8 C) (Oral)   Resp 16   Ht 5' (1.524 m)   Wt 130 lb (59 kg)   SpO2 98%   BMI 25.39 kg/m  CONSTITUTIONAL: Alert and oriented and responds appropriately to questions. Chronically ill appearing HEAD: Normocephalic EYES: Conjunctivae clear, pupils appear equal, EOMI ENT: normal nose; moist mucous membranes NECK: Supple, no meningismus, no nuchal rigidity, no LAD; no JVD CARD: RRR; S1 and S2 appreciated; no murmurs, no clicks, no rubs, no gallops RESP: diffuse expiratory wheezing bilaterally, rhonchorous  breath sounds; diminished lung sounds on the left, no hypoxia or respiratory distress, speaking full sentences ABD/GI: Normal bowel sounds; non-distended; soft, non-tender, no rebound, no guarding, no peritoneal signs, no hepatosplenomegaly BACK:  The back appears normal and is non-tender to palpation, there is no CVA tenderness EXT: Normal ROM in all joints; non-tender to palpation; no edema; normal  capillary refill; no cyanosis, no calf tenderness or swelling    SKIN: Normal color for age and race; warm; no rash NEURO: Moves all extremities equally PSYCH: The patient's mood and manner are appropriate. Grooming and personal hygiene are appropriate.  MEDICAL DECISION MAKING: Patient here with COPD exacerbation and possible right-sided pneumonia. He does not appear volume overloaded currently. He is slightly hypotensive but reports this is chronic for him and on review of his records it does appear to be his baseline. He denies feeling lightheaded, dizzy. Labs show no leukocytosis and negative troponin. While lactate, blood cultures and a BNP. We'll give albuterol, Atrovent, Solu-Medrol as well as ceftriaxone and azithromycin.  ED PROGRESS: Patient's second troponin is negative. BNP appears to be around his baseline at 234. Lactate normal at 0.78.  4:20 AM  Pt's lung sounds have worsened after continuous albuterol and Atrovent now with increased wheezing but better aeration. Again he does not appear volume overloaded and I suspect that this is pneumonia  given complaints of subjective fevers, chills or productive cough with yellow sputum. He is getting treated for community-acquired pneumonia. Will admit for pneumonia and COPD exacerbation. PCP is Dr. Jeanie Cooks.   4:30 AM  D/w Dr. Tamala Julian with hospitalist service for admission. Will add on influenza swab. Again his slightly low blood pressure is chronic for him and I do not think he is septic. He has history of CHF and therefore do not want to volume overload him. Discussed this with patient who agrees. Hospitalist will place admission orders. Patient has been updated with this plan.  I reviewed all nursing notes, vitals, pertinent previous records, EKGs, lab and urine results, imaging (as available).    EKG Interpretation  Date/Time:  Friday January 28 2017 03:40:08 EDT Ventricular Rate:  91 PR Interval:    QRS Duration: 133 QT  Interval:  428 QTC Calculation: 527 R Axis:   -66 Text Interpretation:  Sinus rhythm Nonspecific IVCD with LAD LVH with secondary repolarization abnormality Anterior Q waves, possibly due to LVH No significant change since last tracing Confirmed by Zamya Culhane,  DO, Marlowe Cinquemani ([id number]) on 01/28/2017 3:54:23 AM        I personally performed the services described in this documentation, which was scribed in my presence. The recorded information has been reviewed and is accurate.    [place], DO 01/28/17 [phone]

## 2017-01-28 NOTE — Progress Notes (Signed)
[facility]                                                                              Patient Demographics  Ronald [name], is a 61 y.o. male, DOB - 14-Jul-1956, IRW:[id number]  Admit date - 01/28/2017   Admitting Physician Norval Morton, MD  Outpatient Primary MD for the patient is Philis Fendt, MD  Outpatient specialists:   LOS - 0  days    Chief Complaint  Patient presents with  . Cough  . Chest Pain    mostly on his lung when he takes deep breath       Brief summary    Ronald [name] is a 61 y.o. male with medical history significant of HTN, HLD, CAD s/p PCI, MI, COPD, NSCLC s/p left pneumonectomy, chemotherapy, and radiation in 2004; who presents with complaints of a progressively worsening productive cough for 1 week. Patient reported productive cough with yellowish phlegm, wheezing. Found to be tachypneic with hypoxia, O2 sats 89%. Chest x-ray with right lower lobe opacity.   Assessment & Plan    Principal Problem: Acute hypoxic respiratory failure secondary to COPD exacerbation and community-acquired pneumonia - Currently wheezing and tight, diffuse rhonchi - Continue IV Solu-Medrol, increased  to 60 mg every 6 hours, continue scheduled nebs, add Pulmicort, Brovana  - Home O2 evaluation prior to discharge, add flutter valve, IV antibiotics   Active problems  Community-acquired pneumonia:  - influenza panel negative, follow urine strep antigen - Continue empiric antibiotics of ceftriaxone and azithromycin - Follow sputum culture  Acute kidney injury:  - Patient's baseline creatinine had previously been round 0.8-0.9, however patient presents with elevated creatinine of 1.2 - Patient received gentle hydration, follow BMET  Chronic systolic congestive heart failure: Stable. Patient does not appear to be fluid overloaded at this time. BNP  appears lower than previous.  - currently euvolemic, stable, restart Lasix  Essential  hypertension:  - Continue Coreg, Restart Lasix  Coronary artery disease s/p PCI: Last cardiac catheterization 09/2015 showing multivessel disease with EF noted to be 40-45%. - Continue Effient and ASA  History of lung cancer status post left pneumonectomy  Mildly elevated troponin - Follow troponins x3, currently no chest pain, 2-D echo  Code Status: full  DVT Prophylaxis:  Lovenox  Family Communication: Discussed in detail with the patient, all imaging results, lab results explained to the patient    Disposition Plan:   Time Spent in minutes   25 minutes  Procedures:    Consultants:     Antimicrobials:   IV Zithromax  IV Rocephin    Medications  Scheduled Meds: . arformoterol  15 mcg Nebulization BID  . [START ON 01/31/2017] aspirin  325 mg Oral Once per day on Mon Thu  . azithromycin  500 mg Intravenous QHS  . budesonide (PULMICORT) nebulizer solution  0.25 mg Nebulization BID  . carvedilol  3.125 mg Oral BID WC  . cefTRIAXone (ROCEPHIN)  IV  1 g Intravenous Q24H  . enoxaparin (LOVENOX) injection  40 mg Subcutaneous Daily  . guaiFENesin  600 mg Oral BID  . ipratropium-albuterol  3 mL Nebulization  QID  . methylPREDNISolone (SOLU-MEDROL) injection  60 mg Intravenous Q6H  . montelukast  10 mg Oral BID  . prasugrel  10 mg Oral Daily   Continuous Infusions: PRN Meds:.acetaminophen **OR** acetaminophen, ipratropium-albuterol, nitroGLYCERIN, ondansetron **OR** ondansetron (ZOFRAN) IV   Antibiotics   Anti-infectives    Start     Dose/Rate Route Frequency Ordered Stop   01/28/17 2200  azithromycin (ZITHROMAX) 500 mg in dextrose 5 % 250 mL IVPB     500 mg 250 mL/hr over 60 Minutes Intravenous Daily at bedtime 01/28/17 0635     01/28/17 2200  cefTRIAXone (ROCEPHIN) 1 g in dextrose 5 % 50 mL IVPB     1 g 100 mL/hr over 30 Minutes Intravenous Every 24 hours 01/28/17 0648     01/28/17 0130  cefTRIAXone (ROCEPHIN) 1 g in dextrose 5 % 50 mL IVPB     1 g 100 mL/hr  over 30 Minutes Intravenous  Once 01/28/17 0123 01/28/17 0437   01/28/17 0130  azithromycin (ZITHROMAX) 500 mg in dextrose 5 % 250 mL IVPB     500 mg 250 mL/hr over 60 Minutes Intravenous  Once 01/28/17 0123 01/28/17 0407        Subjective:   Ronald [name] was seen and examined today. Currently diffusely wheezing, tight, having shortness of breath. No fevers or chills. Patient denies dizziness, abdominal pain, N/V/D/C, new weakness, numbess, tingling. No acute events overnight.    Objective:   Vitals:   01/28/17 0817 01/28/17 0856 01/28/17 0908 01/28/17 0922  BP: 105/72  102/67   Pulse: 86  86   Resp:      Temp:      TempSrc:      SpO2:  98% 96% 97%  Weight:      Height:        Intake/Output Summary (Last 24 hours) at 01/28/17 0950 Last data filed at 01/28/17 0817  Gross per 24 hour  Intake                0 ml  Output              125 ml  Net             -125 ml     Wt Readings from Last 3 Encounters:  01/28/17 62.6 kg (138 lb)  10/14/15 62.5 kg (137 lb 12.8 oz)  07/06/15 60.7 kg (133 lb 14.4 oz)     Exam  General: Alert and oriented x 3, NAD  HEENT:    Neck: Supple, no JVD, no masses  Cardiovascular: S1 S2 auscultated, no rubs, murmurs or gallops. Regular rate and rhythm.  Respiratory: Diffuse expiratory wheezing bilaterally  Gastrointestinal: Soft, nontender, nondistended, + bowel sounds  Ext: no cyanosis clubbing or edema  Neuro: AAOx3, Cr N's II- XII. Strength 5/5 upper and lower extremities bilaterally  Skin: No rashes  Psych: Normal affect and demeanor, alert and oriented x3    Data Reviewed:  I have personally reviewed following labs and imaging studies  Micro Results No results found for this or any previous visit (from the past 240 hour(s)).  Radiology Reports Dg Chest 2 View  Result Date: 01/27/2017 CLINICAL DATA:  Cough and congestion. Chest pain, shortness of breath and fatigue. EXAM: CHEST  2 VIEW COMPARISON:  10/10/2015  FINDINGS: The patient is status post left pneumonectomy. Leftward shift of the mediastinum identified. Coronary artery stents are again noted. The cardiac silhouette is largely obscured by postsurgical changes involving the left hemithorax and  left hydrothorax. There is no evidence for right-sided pleural effusion or pneumothorax. Similar appearance of chronic increased interstitial markings with a lower lobe predominance. Increase interstitial and airspace opacities within the right lower lobe identified. IMPRESSION: 1. Increase interstitial and airspace opacities within the right lower lobe which may reflect pulmonary edema or superimposed infection. Electronically Signed   By: Kerby Moors M.D.   On: 01/27/2017 21:12    Lab Data:  CBC:  Recent Labs Lab 01/27/17 2033  WBC 7.8  HGB 12.7*  HCT 37.2*  MCV 82.9  PLT 701   Basic Metabolic Panel:  Recent Labs Lab 01/27/17 2033  NA 135  K 4.7  CL 104  CO2 21*  GLUCOSE 81  BUN 21*  CREATININE 1.20  CALCIUM 9.5   GFR: Estimated Creatinine Clearance: 51.6 mL/min (by C-G formula based on SCr of 1.2 mg/dL). Liver Function Tests: No results for input(s): AST, ALT, ALKPHOS, BILITOT, PROT, ALBUMIN in the last 168 hours. No results for input(s): LIPASE, AMYLASE in the last 168 hours. No results for input(s): AMMONIA in the last 168 hours. Coagulation Profile: No results for input(s): INR, PROTIME in the last 168 hours. Cardiac Enzymes:  Recent Labs Lab 01/28/17 0622  TROPONINI 0.03*   BNP (last 3 results) No results for input(s): PROBNP in the last 8760 hours. HbA1C: No results for input(s): HGBA1C in the last 72 hours. CBG: No results for input(s): GLUCAP in the last 168 hours. Lipid Profile: No results for input(s): CHOL, HDL, LDLCALC, TRIG, CHOLHDL, LDLDIRECT in the last 72 hours. Thyroid Function Tests: No results for input(s): TSH, T4TOTAL, FREET4, T3FREE, THYROIDAB in the last 72 hours. Anemia Panel: No results for  input(s): VITAMINB12, FOLATE, FERRITIN, TIBC, IRON, RETICCTPCT in the last 72 hours. Urine analysis:    Component Value Date/Time   COLORURINE YELLOW 07/04/2015 0600   APPEARANCEUR CLEAR 07/04/2015 0600   LABSPEC 1.025 07/04/2015 0600   PHURINE 5.0 07/04/2015 0600   GLUCOSEU NEGATIVE 07/04/2015 0600   HGBUR NEGATIVE 07/04/2015 0600   BILIRUBINUR NEGATIVE 07/04/2015 0600   KETONESUR NEGATIVE 07/04/2015 0600   PROTEINUR NEGATIVE 07/04/2015 0600   UROBILINOGEN 0.2 07/04/2015 0600   NITRITE NEGATIVE 07/04/2015 0600   LEUKOCYTESUR NEGATIVE 07/04/2015 0600     Ronald [name] M.D. [facility] 01/28/2017, 9:50 AM  Pager: [phone] Between 7am to 7pm - call Pager - [phone]  After 7pm go to [email or web address] - password [email or web address]  Call night coverage person covering after 7pm

## 2017-01-28 NOTE — Evaluation (Signed)
Physical Therapy Evaluation Patient Details Name: Ronald [name] MRN: [id number] DOB: Aug 03, 1956 Today's Date: 01/28/2017   History of Present Illness  Patient is a 61 yo male admitted 01/28/17 with worsening productive cough.  Patient with acute respiratory failure with hypoxia due to COPD, CAP, tachycardia.     PMH:  HTN, HLD, CAD, MI, s/p PCI, COPD, lung CA s/p Lt pneumonectomy, chemo and radiation 2004.  Clinical Impression  Patient presents with problems listed below.  Will benefit from acute PT to maximize functional mobility prior to discharge home.  Patient limited by DOE.  Feel mobility will improve as respiratory status improves.  Do not anticipate any f/u PT needs at this time.  Will continue to assess.    Follow Up Recommendations No PT follow up;Supervision for mobility/OOB    Equipment Recommendations  Other (comment) (TBD)    Recommendations for Other Services       Precautions / Restrictions Precautions Precautions: None Precaution Comments: On O2 Restrictions Weight Bearing Restrictions: No      Mobility  Bed Mobility Overal bed mobility: Modified Independent             General bed mobility comments: Increased time  Transfers Overall transfer level: Needs assistance Equipment used: None Transfers: Sit to/from Stand Sit to Stand: Supervision         General transfer comment: Supervision for safety only.  Patient stood x 20 seconds, became SOB and asked to return to sitting.  O2 sats at 96% on O2.  Patient declined further mobility.  Ambulation/Gait             General Gait Details: NT  Stairs            Wheelchair Mobility    Modified Rankin (Stroke Patients Only)       Balance Overall balance assessment: Needs assistance Sitting-balance support: No upper extremity supported;Feet supported Sitting balance-Leahy Scale: Good     Standing balance support: No upper extremity supported Standing balance-Leahy Scale: Fair                               Pertinent Vitals/Pain Pain Assessment: No/denies pain    Home Living Family/patient expects to be discharged to:: Private residence Living Arrangements: Other relatives (son and son's girlfriend) Available Help at Discharge: Family;Personal care attendant;Available 24 hours/day (Aide 7 days/week, 2.5 hours/day) Type of Home: Apartment Home Access: Stairs to enter Entrance Stairs-Rails: None Entrance Stairs-Number of Steps: 1 Home Layout: One level Home Equipment: None      Prior Function Level of Independence: Independent;Needs assistance   Gait / Transfers Assistance Needed: Ambulates independently.  Has to stop frequently due to SOB.  ADL's / Homemaking Assistance Needed: Aide assists with bathing and dressing.  Aide and family assist with meal prep and housekeeping.        Hand Dominance        Extremity/Trunk Assessment   Upper Extremity Assessment Upper Extremity Assessment: Overall WFL for tasks assessed    Lower Extremity Assessment Lower Extremity Assessment: Generalized weakness    Cervical / Trunk Assessment Cervical / Trunk Assessment: Normal  Communication   Communication: Expressive difficulties (low/soft voice)  Cognition Arousal/Alertness: Awake/alert Behavior During Therapy: WFL for tasks assessed/performed Overall Cognitive Status: Within Functional Limits for tasks assessed                      General Comments  Exercises     Assessment/Plan    PT Assessment Patient needs continued PT services  PT Problem List Decreased strength;Decreased activity tolerance;Decreased balance;Decreased mobility;Decreased knowledge of use of DME;Cardiopulmonary status limiting activity       PT Treatment Interventions DME instruction;Gait training;Functional mobility training;Therapeutic activities;Therapeutic exercise;Balance training;Patient/family education    PT Goals (Current goals can be found in  the Care Plan section)  Acute Rehab PT Goals Patient Stated Goal: To get better. To return home. PT Goal Formulation: With patient Time For Goal Achievement: 02/04/17 Potential to Achieve Goals: Good    Frequency Min 3X/week   Barriers to discharge        Co-evaluation               End of Session Equipment Utilized During Treatment: Oxygen Activity Tolerance: Patient limited by fatigue (Limited by DOE) Patient left: in bed;with call bell/phone within reach (sitting EOB)   PT Visit Diagnosis: Muscle weakness (generalized) (M62.81);Difficulty in walking, not elsewhere classified (R26.2)         Time: 1139-1150 PT Time Calculation (min) (ACUTE ONLY): 11 min   Charges:   PT Evaluation $PT Eval Moderate Complexity: 1 Procedure     PT G Codes:         Despina Pole Jan 30, 2017, 4:22 PM Carita Pian. Sanjuana Kava, [place] Pager [phone]

## 2017-01-28 NOTE — Progress Notes (Signed)
LCSW aware of consult for COPD Gold Protocol Patient does not meet criteria for screening due to having only 1 admission in last 6 months.  COPD gold patients have had 3 or more admissions to hospital in last 6 months.  Will be available if needs arise during hospitalization, please re-consult if needed.   Lane Hacker, MSW Clinical Social Work: [employer] Coverage for :  [phone]

## 2017-01-28 NOTE — H&P (Signed)
History and Physical    Ronald [name] UXL:[id number] DOB: 01-15-1956 DOA: 01/28/2017  Referring MD/NP/PA: Dr. Leonides Schanz PCP: Philis Fendt, MD  Patient coming from: Home   Chief Complaint: Cough  HPI: Ronald [name] is a 61 y.o. male with medical history significant of HTN, HLD, CAD s/p PCI, MI, COPD, NSCLC s/p left pneumonectomy, chemotherapy, and radiation in 2004; who presents with complaints of a progressively worsening productive cough for 1 week. Reports coughing up copious amounts of yellow sputum. Associated symptoms include generalized malaise, cold chills, and wheezing. He tried using his home inhalers without relief of symptoms. Patient denies having any change in weight, leg swelling, recent hospitalizations, nausea, vomiting, abdominal pain, or lightheadedness. Patient reports quitting tobacco use approximately 3 years ago.  ED Course: On admission patient was seen to be tachypneic with O2 saturations as low as 89% on room air. Chest x-ray showed signs of a right lower lobe opacity suggestive of pneumonia. Patient was started on empiric antibiotics of  Review of Systems: As per HPI otherwise 10 point review of systems negative.   Past Medical History:  Diagnosis Date  . CHF (congestive heart failure) ([place])   . COPD (chronic obstructive pulmonary disease) ([place])   . Coronary artery disease   . Hypertension   . lung ca dx'd 2005   chemo/xrt comp 2005, lung ca  . Myocardial infarction   . Shortness of breath     Past Surgical History:  Procedure Laterality Date  . CARDIAC CATHETERIZATION N/A 10/13/2015   Procedure: Left Heart Cath and Coronary Angiography;  Surgeon: [name], MD;  Location: [facility] CV LAB;  Service: Cardiovascular;  Laterality: N/A;  . CORONARY ANGIOPLASTY WITH STENT PLACEMENT    . LEFT HEART CATHETERIZATION WITH CORONARY ANGIOGRAM N/A 04/07/2012   Procedure: LEFT HEART CATHETERIZATION WITH CORONARY ANGIOGRAM;  Surgeon: [name], MD;   Location: [facility] CATH LAB;  Service: Cardiovascular;  Laterality: N/A;  . PNEUMONECTOMY  2005   left     reports that he quit smoking about 3 years ago. His smoking use included Cigarettes. He has never used smokeless tobacco. He reports that he does not drink alcohol or use drugs.  Allergies  Allergen Reactions  . Penicillins Itching    Family History  Problem Relation Age of Onset  . Hypertension Other   . Diabetes Other     Prior to Admission medications   Medication Sig Start Date End Date Taking? Authorizing Provider  albuterol (PROVENTIL HFA;VENTOLIN HFA) 108 (90 BASE) MCG/ACT inhaler Inhale 2 puffs into the lungs every 4 (four) hours as needed for wheezing or shortness of breath. 10/14/13  Yes Kristen N Ward, DO  aspirin 325 MG tablet Take 325 mg by mouth 2 (two) times a week.   Yes Historical Provider, MD  carvedilol (COREG) 3.125 MG tablet Take 1 tablet (3.125 mg total) by mouth 2 (two) times daily with a meal. 07/06/15  Yes Orson Eva, MD  furosemide (LASIX) 40 MG tablet Take 1 tablet (40 mg total) by mouth 2 (two) times daily. Twice a day for 3 days, then back to normal dosing. Patient taking differently: Take 40 mg by mouth daily as needed for fluid.  05/02/15  Yes Benjamine Mola, FNP  montelukast (SINGULAIR) 10 MG tablet Take 1 tablet (10 mg total) by mouth at bedtime. Patient taking differently: Take 10 mg by mouth 2 (two) times daily.  05/02/15  Yes Benjamine Mola, FNP  nitroGLYCERIN (NITROSTAT) 0.4 MG SL tablet  Place 1 tablet (0.4 mg total) under the tongue every 5 (five) minutes x 3 doses as needed for chest pain. 11/01/13  Yes [name], MD  prasugrel (EFFIENT) 10 MG TABS tablet Take 1 tablet (10 mg total) by mouth daily. 07/06/15  Yes Orson Eva, MD  spironolactone (ALDACTONE) 25 MG tablet Take 1 tablet (25 mg total) by mouth daily. 07/06/15  Yes Orson Eva, MD    Physical Exam: Constitutional: Elderly male who appears be in moderate distress Vitals:   01/28/17 0300  01/28/17 0311 01/28/17 0330 01/28/17 0400  BP: '93/68 93/68 95/77 '$ 98/77  Pulse: 89 84 88 88  Resp: 20 14 (!) 21 20  Temp:      TempSrc:      SpO2: 97% 96% 96% 97%  Weight:      Height:       Eyes: PERRL, lids and conjunctivae normal ENMT: Mucous membranes are dry. Posterior pharynx clear of any exudate or lesions.  Neck: normal, supple, no masses, no thyromegaly Respiratory: Tachypneic with expiratory wheezes and rhonchi most notably in the right lung field. Decreased breath sounds on left lung field. Cardiovascular: Regular rate and rhythm, no murmurs / rubs / gallops. No extremity edema. 2+ pedal pulses. No carotid bruits.  Abdomen: no tenderness, no masses palpated. No hepatosplenomegaly. Bowel sounds positive.  Musculoskeletal: no clubbing / cyanosis. No joint deformity upper and lower extremities. Good ROM, no contractures. Normal muscle tone.  Skin: healed left pneumonectomy scar.no rashes, lesions, ulcers. No induration Neurologic: CN 2-12 grossly intact. Sensation intact, DTR normal. Strength 5/5 in all 4.  Psychiatric: Normal judgment and insight. Alert and oriented x 3. Normal mood.     Labs on Admission: I have personally reviewed following labs and imaging studies  CBC:  Recent Labs Lab 01/27/17 2033  WBC 7.8  HGB 12.7*  HCT 37.2*  MCV 82.9  PLT 678   Basic Metabolic Panel:  Recent Labs Lab 01/27/17 2033  NA 135  K 4.7  CL 104  CO2 21*  GLUCOSE 81  BUN 21*  CREATININE 1.20  CALCIUM 9.5   GFR: Estimated Creatinine Clearance: 46.9 mL/min (by C-G formula based on SCr of 1.2 mg/dL). Liver Function Tests: No results for input(s): AST, ALT, ALKPHOS, BILITOT, PROT, ALBUMIN in the last 168 hours. No results for input(s): LIPASE, AMYLASE in the last 168 hours. No results for input(s): AMMONIA in the last 168 hours. Coagulation Profile: No results for input(s): INR, PROTIME in the last 168 hours. Cardiac Enzymes: No results for input(s): CKTOTAL, CKMB,  CKMBINDEX, TROPONINI in the last 168 hours. BNP (last 3 results) No results for input(s): PROBNP in the last 8760 hours. HbA1C: No results for input(s): HGBA1C in the last 72 hours. CBG: No results for input(s): GLUCAP in the last 168 hours. Lipid Profile: No results for input(s): CHOL, HDL, LDLCALC, TRIG, CHOLHDL, LDLDIRECT in the last 72 hours. Thyroid Function Tests: No results for input(s): TSH, T4TOTAL, FREET4, T3FREE, THYROIDAB in the last 72 hours. Anemia Panel: No results for input(s): VITAMINB12, FOLATE, FERRITIN, TIBC, IRON, RETICCTPCT in the last 72 hours. Urine analysis:    Component Value Date/Time   COLORURINE YELLOW 07/04/2015 0600   APPEARANCEUR CLEAR 07/04/2015 0600   LABSPEC 1.025 07/04/2015 0600   PHURINE 5.0 07/04/2015 0600   GLUCOSEU NEGATIVE 07/04/2015 0600   HGBUR NEGATIVE 07/04/2015 0600   BILIRUBINUR NEGATIVE 07/04/2015 0600   KETONESUR NEGATIVE 07/04/2015 0600   PROTEINUR NEGATIVE 07/04/2015 0600   UROBILINOGEN 0.2 07/04/2015 0600  NITRITE NEGATIVE 07/04/2015 0600   LEUKOCYTESUR NEGATIVE 07/04/2015 0600   Sepsis Labs: No results found for this or any previous visit (from the past 240 hour(s)).   Radiological Exams on Admission: Dg Chest 2 View  Result Date: 01/27/2017 CLINICAL DATA:  Cough and congestion. Chest pain, shortness of breath and fatigue. EXAM: CHEST  2 VIEW COMPARISON:  10/10/2015 FINDINGS: The patient is status post left pneumonectomy. Leftward shift of the mediastinum identified. Coronary artery stents are again noted. The cardiac silhouette is largely obscured by postsurgical changes involving the left hemithorax and left hydrothorax. There is no evidence for right-sided pleural effusion or pneumothorax. Similar appearance of chronic increased interstitial markings with a lower lobe predominance. Increase interstitial and airspace opacities within the right lower lobe identified. IMPRESSION: 1. Increase interstitial and airspace opacities  within the right lower lobe which may reflect pulmonary edema or superimposed infection. Electronically Signed   By: Kerby Moors M.D.   On: 01/27/2017 21:12    EKG: Independently reviewed. Sinus rhythm similar to previous EKGs  Assessment/Plan COPD exacerbationproductive cough, subjective chill. Physical exam findings include wheezing plus rhonchi consistent with COPD exacerbation. Given 125 mg of Solu-Medrol in the ED and multiple breathing treatments. - Admit to a telemetry bed - Continuous pulse oximetry was nasal cannula oxygen as needed - Solumedrol 60 mg IV q 8hr - DuoNeb's QID and prn sob/wheezing  Community-acquired pneumonia: As seen on chest x-ray. - Continue empiric antibiotics of ceftriaxone and azithromycin - Sputum culture - Mucinex  Acute kidney injury: Patient's baseline creatinine had previously been round 0.8-0.9, however patient presents with elevated creatinine of 1.2 and a BUN of 21.  - Gentle IV fluids NS at 75 ml/hr - Recheck BMP more a.m.  Chronic systolic congestive heart failure: Stable. Patient does not appear to be fluid overloaded at this time. BNP  appears lower than previous.  - Initially held spironolactone and Lasix restarted when medically appropriate.  Essential hypertension:  - Continue Coreg - Restart furosemide prn fluid and spironolactone when medically appropriate   Coronary artery disease s/p PCI: Last cardiac catheterization 09/2015 showing multivessel disease with EF noted to be 40-45%. - Continue Effient and ASA  History of lung cancer status post left pneumonectomy  History of polysubstance abuse: Patient denies any use of illegal drugs or tobacco.  DVT prophylaxis: Lovenox Code Status: Full  Family Communication: No family present at bedside Disposition Plan: Likely discharge, smokeless Consults called: none Admission status: Inpatient  Norval Morton MD [facility] Pager [phone]  If 7PM-7AM, please contact  night-coverage [email or web address] Password [facility]  01/28/2017, 4:45 AM

## 2017-01-28 NOTE — Care Management Note (Signed)
Case Management Note  Patient Details  Name: Ronald [name] MRN: [id number] Date of Birth: 04/12/1956  Subjective/Objective:    Admitted with COPD, Pneumonia, AKI               Action/Plan: PCP: Philis Fendt, MD; has private insurance with Medicare/ Medicaid with prescription drug coverage; CM will continue to follow for DCP  Expected Discharge Date:     Possibly 02/01/2017             Expected Discharge Plan:  Home/Self Care  Discharge planning Services  CM Consult  Status of Service:  In process, will continue to follow  Sherrilyn Rist [phone] 01/28/2017, 10:28 AM

## 2017-01-29 LAB — BASIC METABOLIC PANEL
ANION GAP: 11 (ref 5–15)
BUN: 22 mg/dL — ABNORMAL HIGH (ref 6–20)
CHLORIDE: 104 mmol/L (ref 101–111)
CO2: 21 mmol/L — AB (ref 22–32)
Calcium: 8.9 mg/dL (ref 8.9–10.3)
Creatinine, Ser: 1.06 mg/dL (ref 0.61–1.24)
GFR calc non Af Amer: >60 mL/min (ref >60)
GLUCOSE: 131 mg/dL — AB (ref 65–99)
POTASSIUM: 4.1 mmol/L (ref 3.5–5.1)
Sodium: 136 mmol/L (ref 135–145)

## 2017-01-29 LAB — BLOOD CULTURE ID PANEL (REFLEXED)
Acinetobacter baumannii: NOT DETECTED
CANDIDA GLABRATA: NOT DETECTED
CANDIDA KRUSEI: NOT DETECTED
CANDIDA PARAPSILOSIS: NOT DETECTED
Candida albicans: NOT DETECTED
Candida tropicalis: NOT DETECTED
ENTEROBACTER CLOACAE COMPLEX: NOT DETECTED
ENTEROCOCCUS SPECIES: NOT DETECTED
ESCHERICHIA COLI: NOT DETECTED
Enterobacteriaceae species: NOT DETECTED
Haemophilus influenzae: NOT DETECTED
KLEBSIELLA OXYTOCA: NOT DETECTED
KLEBSIELLA PNEUMONIAE: NOT DETECTED
LISTERIA MONOCYTOGENES: NOT DETECTED
Methicillin resistance: NOT DETECTED
Neisseria meningitidis: NOT DETECTED
PROTEUS SPECIES: NOT DETECTED
Pseudomonas aeruginosa: NOT DETECTED
STAPHYLOCOCCUS SPECIES: DETECTED — AB
STREPTOCOCCUS AGALACTIAE: NOT DETECTED
Serratia marcescens: NOT DETECTED
Staphylococcus aureus (BCID): NOT DETECTED
Streptococcus pneumoniae: NOT DETECTED
Streptococcus pyogenes: NOT DETECTED
Streptococcus species: NOT DETECTED

## 2017-01-29 LAB — CBC
HEMATOCRIT: 35.5 % — AB (ref 39.0–52.0)
HEMOGLOBIN: 12.2 g/dL — AB (ref 13.0–17.0)
MCH: 28.8 pg (ref 26.0–34.0)
MCHC: 34.4 g/dL (ref 30.0–36.0)
MCV: 83.7 fL (ref 78.0–100.0)
Platelets: 304 10*3/uL (ref 150–400)
RBC: 4.24 MIL/uL (ref 4.22–5.81)
RDW: 15.9 % — ABNORMAL HIGH (ref 11.5–15.5)
WBC: 15.5 10*3/uL — ABNORMAL HIGH (ref 4.0–10.5)

## 2017-01-29 LAB — TROPONIN I

## 2017-01-29 MED ORDER — NICOTINE 14 MG/24HR TD PT24
14.0000 mg | MEDICATED_PATCH | Freq: Every day | TRANSDERMAL | Status: DC
  Administered 2017-01-29 – 2017-01-31 (×3): 14 mg via TRANSDERMAL
  Filled 2017-01-29 (×3): qty 1

## 2017-01-29 MED ORDER — ZOLPIDEM TARTRATE 5 MG PO TABS
5.0000 mg | ORAL_TABLET | Freq: Once | ORAL | Status: AC
  Administered 2017-01-30: 5 mg via ORAL
  Filled 2017-01-29: qty 1

## 2017-01-29 MED ORDER — IPRATROPIUM-ALBUTEROL 0.5-2.5 (3) MG/3ML IN SOLN
3.0000 mL | Freq: Three times a day (TID) | RESPIRATORY_TRACT | Status: DC
  Administered 2017-01-29 – 2017-01-30 (×2): 3 mL via RESPIRATORY_TRACT
  Filled 2017-01-29 (×2): qty 3

## 2017-01-29 NOTE — Progress Notes (Signed)
PHARMACY - PHYSICIAN COMMUNICATION CRITICAL VALUE ALERT - BLOOD CULTURE IDENTIFICATION (BCID)  Results for orders placed or performed during the hospital encounter of 01/28/17  Blood Culture ID Panel (Reflexed) (Collected: 01/28/2017  1:56 AM)  Result Value Ref Range   Enterococcus species NOT DETECTED NOT DETECTED   Listeria monocytogenes NOT DETECTED NOT DETECTED   Staphylococcus species DETECTED (A) NOT DETECTED   Staphylococcus aureus NOT DETECTED NOT DETECTED   Methicillin resistance NOT DETECTED NOT DETECTED   Streptococcus species NOT DETECTED NOT DETECTED   Streptococcus agalactiae NOT DETECTED NOT DETECTED   Streptococcus pneumoniae NOT DETECTED NOT DETECTED   Streptococcus pyogenes NOT DETECTED NOT DETECTED   Acinetobacter baumannii NOT DETECTED NOT DETECTED   Enterobacteriaceae species NOT DETECTED NOT DETECTED   Enterobacter cloacae complex NOT DETECTED NOT DETECTED   Escherichia coli NOT DETECTED NOT DETECTED   Klebsiella oxytoca NOT DETECTED NOT DETECTED   Klebsiella pneumoniae NOT DETECTED NOT DETECTED   Proteus species NOT DETECTED NOT DETECTED   Serratia marcescens NOT DETECTED NOT DETECTED   Haemophilus influenzae NOT DETECTED NOT DETECTED   Neisseria meningitidis NOT DETECTED NOT DETECTED   Pseudomonas aeruginosa NOT DETECTED NOT DETECTED   Candida albicans NOT DETECTED NOT DETECTED   Candida glabrata NOT DETECTED NOT DETECTED   Candida krusei NOT DETECTED NOT DETECTED   Candida parapsilosis NOT DETECTED NOT DETECTED   Candida tropicalis NOT DETECTED NOT DETECTED    Name of physician (or Provider) Contacted:   NA  Changes to prescribed antibiotics required:   None required  1/2 blood cultures growing methicillin sensitive CoNS.  Likely contaminant.  Receiving Rocephin for  CAP.       Caryl Pina 01/29/2017  7:35 AM

## 2017-01-29 NOTE — Progress Notes (Signed)
Physical Therapy Treatment Patient Details Name: Ronald [name] MRN: [id number] DOB: 09/13/1956 Today's Date: 01/29/2017    History of Present Illness Patient is a 61 yo male admitted 01/28/17 with worsening productive cough.  Patient with acute respiratory failure with hypoxia due to COPD, CAP, tachycardia.     PMH:  HTN, HLD, CAD, MI, s/p PCI, COPD, lung CA s/p Lt pneumonectomy, chemo and radiation 2004.    PT Comments    Patient able to ambulate short distance today with no assistive device and supervision only.  Making progress toward PT goals.   Follow Up Recommendations  No PT follow up;Supervision for mobility/OOB     Equipment Recommendations  None recommended by PT    Recommendations for Other Services       Precautions / Restrictions Precautions Precautions: None Precaution Comments: On O2 Restrictions Weight Bearing Restrictions: No    Mobility  Bed Mobility Overal bed mobility: Modified Independent             General bed mobility comments: Increased time  Transfers Overall transfer level: Needs assistance Equipment used: None Transfers: Sit to/from Stand Sit to Stand: Supervision         General transfer comment: stood with supervision for safety, no physical assist  Ambulation/Gait Ambulation/Gait assistance: Supervision Ambulation Distance (Feet): 60 Feet Assistive device: None Gait Pattern/deviations: Step-through pattern;Decreased stride length Gait velocity: decreased Gait velocity interpretation: Below normal speed for age/gender General Gait Details: Patient with slow steady gait.  Required 1 standing rest break due to dyspnea.  No loss of balance during gait.   Stairs            Wheelchair Mobility    Modified Rankin (Stroke Patients Only)       Balance     Sitting balance-Leahy Scale: Good     Standing balance support: No upper extremity supported Standing balance-Leahy Scale: Good                       Cognition Arousal/Alertness: Awake/alert Behavior During Therapy: WFL for tasks assessed/performed Overall Cognitive Status: Within Functional Limits for tasks assessed                      Exercises      General Comments        Pertinent Vitals/Pain Pain Assessment: No/denies pain    Home Living                      Prior Function            PT Goals (current goals can now be found in the care plan section) Acute Rehab PT Goals Patient Stated Goal: To get better. To return home. Progress towards PT goals: Progressing toward goals    Frequency    Min 3X/week      PT Plan Current plan remains appropriate    Co-evaluation             End of Session Equipment Utilized During Treatment: Oxygen Activity Tolerance: Patient limited by fatigue (Limited by DOE) Patient left: in bed;with call bell/phone within reach   PT Visit Diagnosis: Muscle weakness (generalized) (M62.81);Difficulty in walking, not elsewhere classified (R26.2)     Time: 3810-1751 PT Time Calculation (min) (ACUTE ONLY): 10 min  Charges:  $Gait Training: 8-22 mins                    G Codes:  Despina Pole 01/29/2017, 1:12 PM Carita Pian Sanjuana Kava, [place] Pager [phone]

## 2017-01-29 NOTE — Progress Notes (Signed)
[facility]                                                                              Patient Demographics  Ronald [name], is a 61 y.o. male, DOB - Apr 13, 1956, TKP:[id number]  Admit date - 01/28/2017   Admitting Physician Norval Morton, MD  Outpatient Primary MD for the patient is Philis Fendt, MD  Outpatient specialists:   LOS - 1  days    Chief Complaint  Patient presents with  . Cough  . Chest Pain    mostly on his lung when he takes deep breath       Brief summary    Ronald [name] is a 61 y.o. male with medical history significant of HTN, HLD, CAD s/p PCI, MI, COPD, NSCLC s/p left pneumonectomy, chemotherapy, and radiation in 2004; who presents with complaints of a progressively worsening productive cough for 1 week. Patient reported productive cough with yellowish phlegm, wheezing. Found to be tachypneic with hypoxia, O2 sats 89%. Chest x-ray with right lower lobe opacity.   Assessment & Plan    Principal Problem: Acute hypoxic respiratory failure secondary to COPD exacerbation and community-acquired pneumonia - Still very tight and wheezing,  - No changes today, continue IV Solu-Medrol, 60 mg every 6 hours, continue scheduled nebs, Pulmicort, Brovana  - Home O2 evaluation prior to discharge, add flutter valve, IV antibiotics   Active problems  Community-acquired pneumonia:  - influenza panel negative - Continue empiric antibiotics of ceftriaxone and azithromycin - Follow sputum culture -Urine strep antigen negative  - Blood culture 1/2 methicillin susceptible coagulase-negative staph likely contaminant  Acute kidney injury:  - Patient's baseline creatinine had previously been round 0.8-0.9, however patient presents with elevated creatinine of 1.2 -Creatinine stable  Chronic systolic  and diastolic congestive heart failure: Stable. Patient does not appear to be fluid overloaded at this time. BNP  appears lower than previous.    -Continue Lasix - 2-D echo showed EF of 45-50% with grade 2 diastolic dysfunction. Cath in 11/16 had shown EF of 40-45%.  Essential hypertension:  - Continue Coreg, Restart Lasix  Coronary artery disease s/p PCI: Last cardiac catheterization 09/2015 showing multivessel disease with EF noted to be 40-45%. - Continue Effient and ASA  History of lung cancer status post left pneumonectomy  Mildly elevated troponin - Troponins negative after the first, 2-D echo obtained showed EF of 51-70%, grade 2 diastolic dysfunction  Code Status: full  DVT Prophylaxis:  Lovenox  Family Communication: Discussed in detail with the patient, all imaging results, lab results explained to the patient    Disposition Plan:   Time Spent in minutes   25 minutes  Procedures:    Consultants:     Antimicrobials:   IV Zithromax  IV Rocephin    Medications  Scheduled Meds: . arformoterol  15 mcg Nebulization BID  . [START ON 01/31/2017] aspirin  325 mg Oral Once per day on Mon Thu  . azithromycin  500 mg Intravenous QHS  . budesonide (PULMICORT) nebulizer solution  0.25 mg Nebulization BID  . carvedilol  3.125 mg Oral BID WC  . cefTRIAXone (ROCEPHIN)  IV  1 g Intravenous Q24H  . enoxaparin (LOVENOX) injection  40 mg Subcutaneous Daily  . furosemide  40 mg Oral BID  . guaiFENesin  600 mg Oral BID  . ipratropium-albuterol  3 mL Nebulization QID  . methylPREDNISolone (SOLU-MEDROL) injection  60 mg Intravenous Q6H  . montelukast  10 mg Oral BID  . prasugrel  10 mg Oral Daily   Continuous Infusions: PRN Meds:.acetaminophen **OR** acetaminophen, ipratropium-albuterol, nitroGLYCERIN, ondansetron **OR** ondansetron (ZOFRAN) IV   Antibiotics   Anti-infectives    Start     Dose/Rate Route Frequency Ordered Stop   01/28/17 2200  azithromycin (ZITHROMAX) 500 mg in dextrose 5 % 250 mL IVPB     500 mg 250 mL/hr over 60 Minutes Intravenous Daily at bedtime 01/28/17 0635     01/28/17 2200   cefTRIAXone (ROCEPHIN) 1 g in dextrose 5 % 50 mL IVPB     1 g 100 mL/hr over 30 Minutes Intravenous Every 24 hours 01/28/17 0648     01/28/17 0130  cefTRIAXone (ROCEPHIN) 1 g in dextrose 5 % 50 mL IVPB     1 g 100 mL/hr over 30 Minutes Intravenous  Once 01/28/17 0123 01/28/17 0437   01/28/17 0130  azithromycin (ZITHROMAX) 500 mg in dextrose 5 % 250 mL IVPB     500 mg 250 mL/hr over 60 Minutes Intravenous  Once 01/28/17 0123 01/28/17 0407        Subjective:   Ronald [name] was seen and examined today. Still diffusely wheezing, short of breath. No fevers or chills. No fevers or chills. Patient denies dizziness, abdominal pain, N/V/D/C, new weakness, numbess, tingling. No acute events overnight.    Objective:   Vitals:   01/28/17 2255 01/29/17 0426 01/29/17 0925 01/29/17 1000  BP: 96/69 95/60  100/78  Pulse:  87  78  Resp:  18    Temp:  97.8 F (36.6 C)    TempSrc:  Oral    SpO2: 98% 100% 99%   Weight:  63.6 kg (140 lb 3.2 oz)    Height:        Intake/Output Summary (Last 24 hours) at 01/29/17 1133 Last data filed at 01/29/17 1056  Gross per 24 hour  Intake             1800 ml  Output             1725 ml  Net               75 ml     Wt Readings from Last 3 Encounters:  01/29/17 63.6 kg (140 lb 3.2 oz)  10/14/15 62.5 kg (137 lb 12.8 oz)  07/06/15 60.7 kg (133 lb 14.4 oz)     Exam  General: Alert and oriented x 3, NAD  HEENT:    Neck: Supple, no JVD, no masses  Cardiovascular: S1 S2 auscultated, no rubs, murmurs or gallops. Regular rate and rhythm.  Respiratory: Diffuse expiratory wheezing bilaterally  Gastrointestinal: Soft, nontender, nondistended, + bowel sounds  Ext: no cyanosis clubbing or edema  Neuro: No new deficits   Skin: No rashes  Psych: Normal affect and demeanor, alert and oriented x3    Data Reviewed:  I have personally reviewed following labs and imaging studies  Micro Results Recent Results (from the past 240 hour(s))  Blood  culture (routine x 2)     Status: None (Preliminary result)   Collection Time: 01/28/17  1:56 AM  Result Value Ref Range Status   Specimen Description  BLOOD LEFT FOREARM  Final   Special Requests AEROBIC BOTTLE ONLY 8ML  Final   Culture  Setup Time   Final    GRAM POSITIVE COCCI IN CLUSTERS AEROBIC BOTTLE ONLY CRITICAL RESULT CALLED TO, READ BACK BY AND VERIFIED WITH: G ABBOTT,PHARMD '@0728'$  MKELLY,MLT    Culture GRAM POSITIVE COCCI  Final   Report Status PENDING  Incomplete  Blood Culture ID Panel (Reflexed)     Status: Abnormal   Collection Time: 01/28/17  1:56 AM  Result Value Ref Range Status   Enterococcus species NOT DETECTED NOT DETECTED Final   Listeria monocytogenes NOT DETECTED NOT DETECTED Final   Staphylococcus species DETECTED (A) NOT DETECTED Final    Comment: Methicillin (oxacillin) susceptible coagulase negative staphylococcus. Possible blood culture contaminant (unless isolated from more than one blood culture draw or clinical case suggests pathogenicity). No antibiotic treatment is indicated for blood  culture contaminants. CRITICAL RESULT CALLED TO, READ BACK BY AND VERIFIED WITH: G ABBOTT, PHARMD '@0728'$  01/29/17 MKELLY,MLT    Staphylococcus aureus NOT DETECTED NOT DETECTED Final   Methicillin resistance NOT DETECTED NOT DETECTED Final   Streptococcus species NOT DETECTED NOT DETECTED Final   Streptococcus agalactiae NOT DETECTED NOT DETECTED Final   Streptococcus pneumoniae NOT DETECTED NOT DETECTED Final   Streptococcus pyogenes NOT DETECTED NOT DETECTED Final   Acinetobacter baumannii NOT DETECTED NOT DETECTED Final   Enterobacteriaceae species NOT DETECTED NOT DETECTED Final   Enterobacter cloacae complex NOT DETECTED NOT DETECTED Final   Escherichia coli NOT DETECTED NOT DETECTED Final   Klebsiella oxytoca NOT DETECTED NOT DETECTED Final   Klebsiella pneumoniae NOT DETECTED NOT DETECTED Final   Proteus species NOT DETECTED NOT DETECTED Final   Serratia  marcescens NOT DETECTED NOT DETECTED Final   Haemophilus influenzae NOT DETECTED NOT DETECTED Final   Neisseria meningitidis NOT DETECTED NOT DETECTED Final   Pseudomonas aeruginosa NOT DETECTED NOT DETECTED Final   Candida albicans NOT DETECTED NOT DETECTED Final   Candida glabrata NOT DETECTED NOT DETECTED Final   Candida krusei NOT DETECTED NOT DETECTED Final   Candida parapsilosis NOT DETECTED NOT DETECTED Final   Candida tropicalis NOT DETECTED NOT DETECTED Final    Radiology Reports Dg Chest 2 View  Result Date: 01/27/2017 CLINICAL DATA:  Cough and congestion. Chest pain, shortness of breath and fatigue. EXAM: CHEST  2 VIEW COMPARISON:  10/10/2015 FINDINGS: The patient is status post left pneumonectomy. Leftward shift of the mediastinum identified. Coronary artery stents are again noted. The cardiac silhouette is largely obscured by postsurgical changes involving the left hemithorax and left hydrothorax. There is no evidence for right-sided pleural effusion or pneumothorax. Similar appearance of chronic increased interstitial markings with a lower lobe predominance. Increase interstitial and airspace opacities within the right lower lobe identified. IMPRESSION: 1. Increase interstitial and airspace opacities within the right lower lobe which may reflect pulmonary edema or superimposed infection. Electronically Signed   By: Kerby Moors M.D.   On: 01/27/2017 21:12    Lab Data:  CBC:  Recent Labs Lab 01/27/17 2033 01/29/17 0441  WBC 7.8 15.5*  HGB 12.7* 12.2*  HCT 37.2* 35.5*  MCV 82.9 83.7  PLT 308 962   Basic Metabolic Panel:  Recent Labs Lab 01/27/17 2033 01/28/17 1108 01/29/17 0441  NA 135 134* 136  K 4.7 4.7 4.1  CL 104 106 104  CO2 21* 19* 21*  GLUCOSE 81 230* 131*  BUN 21* 19 22*  CREATININE 1.20 0.94 1.06  CALCIUM 9.5 9.3 8.9  GFR: Estimated Creatinine Clearance: 58.8 mL/min (by C-G formula based on SCr of 1.06 mg/dL). Liver Function Tests: No results  for input(s): AST, ALT, ALKPHOS, BILITOT, PROT, ALBUMIN in the last 168 hours. No results for input(s): LIPASE, AMYLASE in the last 168 hours. No results for input(s): AMMONIA in the last 168 hours. Coagulation Profile: No results for input(s): INR, PROTIME in the last 168 hours. Cardiac Enzymes:  Recent Labs Lab 01/28/17 0622 01/28/17 1108 01/28/17 1747 01/28/17 2334  TROPONINI 0.03* <0.03 <0.03 <0.03   BNP (last 3 results) No results for input(s): PROBNP in the last 8760 hours. HbA1C: No results for input(s): HGBA1C in the last 72 hours. CBG: No results for input(s): GLUCAP in the last 168 hours. Lipid Profile: No results for input(s): CHOL, HDL, LDLCALC, TRIG, CHOLHDL, LDLDIRECT in the last 72 hours. Thyroid Function Tests: No results for input(s): TSH, T4TOTAL, FREET4, T3FREE, THYROIDAB in the last 72 hours. Anemia Panel: No results for input(s): VITAMINB12, FOLATE, FERRITIN, TIBC, IRON, RETICCTPCT in the last 72 hours. Urine analysis:    Component Value Date/Time   COLORURINE YELLOW 07/04/2015 0600   APPEARANCEUR CLEAR 07/04/2015 0600   LABSPEC 1.025 07/04/2015 0600   PHURINE 5.0 07/04/2015 0600   GLUCOSEU NEGATIVE 07/04/2015 0600   HGBUR NEGATIVE 07/04/2015 0600   BILIRUBINUR NEGATIVE 07/04/2015 0600   KETONESUR NEGATIVE 07/04/2015 0600   PROTEINUR NEGATIVE 07/04/2015 0600   UROBILINOGEN 0.2 07/04/2015 0600   NITRITE NEGATIVE 07/04/2015 0600   LEUKOCYTESUR NEGATIVE 07/04/2015 0600     RAI,RIPUDEEP M.D. [facility] 01/29/2017, 11:33 AM  Pager: [phone] Between 7am to 7pm - call Pager - [phone]  After 7pm go to [email or web address] - password [email or web address]  Call night coverage person covering after 7pm

## 2017-01-30 LAB — CBC
HEMATOCRIT: 35.5 % — AB (ref 39.0–52.0)
Hemoglobin: 12.2 g/dL — ABNORMAL LOW (ref 13.0–17.0)
MCH: 28.4 pg (ref 26.0–34.0)
MCHC: 34.4 g/dL (ref 30.0–36.0)
MCV: 82.8 fL (ref 78.0–100.0)
PLATELETS: 315 10*3/uL (ref 150–400)
RBC: 4.29 MIL/uL (ref 4.22–5.81)
RDW: 16 % — ABNORMAL HIGH (ref 11.5–15.5)
WBC: 19.8 10*3/uL — AB (ref 4.0–10.5)

## 2017-01-30 LAB — BASIC METABOLIC PANEL
Anion gap: 10 (ref 5–15)
BUN: 23 mg/dL — ABNORMAL HIGH (ref 6–20)
CHLORIDE: 101 mmol/L (ref 101–111)
CO2: 24 mmol/L (ref 22–32)
Calcium: 8.6 mg/dL — ABNORMAL LOW (ref 8.9–10.3)
Creatinine, Ser: 1.13 mg/dL (ref 0.61–1.24)
GFR calc non Af Amer: >60 mL/min (ref >60)
Glucose, Bld: 126 mg/dL — ABNORMAL HIGH (ref 65–99)
Potassium: 3.8 mmol/L (ref 3.5–5.1)
SODIUM: 135 mmol/L (ref 135–145)

## 2017-01-30 MED ORDER — IPRATROPIUM-ALBUTEROL 0.5-2.5 (3) MG/3ML IN SOLN: 3.0000 mL | Freq: Four times a day (QID) | RESPIRATORY_TRACT | Status: DC | PRN

## 2017-01-30 MED ORDER — GUAIFENESIN-DM 100-10 MG/5ML PO SYRP
5.0000 mL | ORAL_SOLUTION | ORAL | Status: DC | PRN
  Administered 2017-01-30: 5 mL via ORAL
  Filled 2017-01-30: qty 5

## 2017-01-30 MED ORDER — METHYLPREDNISOLONE SODIUM SUCC 125 MG IJ SOLR
60.0000 mg | Freq: Three times a day (TID) | INTRAMUSCULAR | Status: DC
  Administered 2017-01-30 – 2017-01-31 (×3): 60 mg via INTRAVENOUS
  Filled 2017-01-30 (×3): qty 2

## 2017-01-30 MED ORDER — AZITHROMYCIN 500 MG PO TABS
500.0000 mg | ORAL_TABLET | ORAL | Status: DC
  Administered 2017-01-30: 500 mg via ORAL
  Filled 2017-01-30: qty 1

## 2017-01-30 NOTE — Progress Notes (Signed)
[facility]                                                                              Patient Demographics  Ronald [name], is a 61 y.o. male, DOB - [birth year], GNF:[id number]  Admit date - 01/28/2017   Admitting Physician Norval Morton, MD  Outpatient Primary MD for the patient is Philis Fendt, MD  Outpatient specialists:   LOS - 2  days    Chief Complaint  Patient presents with  . Cough  . Chest Pain    mostly on his lung when he takes deep breath       Brief summary    Ronald [name] is a 61 y.o. male with medical history significant of HTN, HLD, CAD s/p PCI, MI, COPD, NSCLC s/p left pneumonectomy, chemotherapy, and radiation in 2004; who presents with complaints of a progressively worsening productive cough for 1 week. Patient reported productive cough with yellowish phlegm, wheezing. Found to be tachypneic with hypoxia, O2 sats 89%. Chest x-ray with right lower lobe opacity.   Assessment & Plan    Principal Problem: Acute hypoxic respiratory failure secondary to COPD exacerbation and community-acquired pneumonia - Significantly improving today - Taper IV Solu-Medrol 60 mg every 8 hours, will transition to oral prednisone in a.m. - continue scheduled nebs, Pulmicort, Brovana  - Home O2 evaluation prior to discharge, add flutter valve, IV antibiotics   Active problems  Community-acquired pneumonia:  - influenza panel negative - Continue empiric antibiotics of ceftriaxone and azithromycin -Urine strep antigen negative  - Blood culture 1/2 methicillin susceptible coagulase-negative staph likely contaminant  Acute kidney injury:  - Patient's baseline creatinine had previously been round 0.8-0.9, however patient presents with elevated creatinine of 1.2 -Creatinine stable  Chronic systolic  and diastolic congestive heart failure: Stable. Patient does not appear to be fluid overloaded at this time. BNP  appears lower than previous.    -Continue Lasix, negative balance of 1.2 L - 2-D echo showed EF of 45-50% with grade 2 diastolic dysfunction. Cath in 11/16 had shown EF of 40-45%.  Essential hypertension:  - Continue Coreg, Lasix  Coronary artery disease s/p PCI: Last cardiac catheterization 09/2015 showing multivessel disease with EF noted to be 40-45%. - Continue Effient and ASA  History of lung cancer status post left pneumonectomy  Mildly elevated troponin - Troponins negative after the first, 2-D echo obtained showed EF of 84-69%, grade 2 diastolic dysfunction  Code Status: full  DVT Prophylaxis:  Lovenox  Family Communication: Discussed in detail with the patient, all imaging results, lab results explained to the patient    Disposition Plan: DC home in a.m.  Time Spent in minutes   25 minutes  Procedures:    Consultants:     Antimicrobials:   IV Zithromax  IV Rocephin    Medications  Scheduled Meds: . arformoterol  15 mcg Nebulization BID  . [START ON 01/31/2017] aspirin  325 mg Oral Once per day on Mon Thu  . azithromycin  500 mg Oral Q24H  . budesonide (PULMICORT) nebulizer solution  0.25 mg Nebulization BID  . cefTRIAXone (ROCEPHIN)  IV  1  g Intravenous Q24H  . enoxaparin (LOVENOX) injection  40 mg Subcutaneous Daily  . furosemide  40 mg Oral BID  . guaiFENesin  600 mg Oral BID  . methylPREDNISolone (SOLU-MEDROL) injection  60 mg Intravenous Q8H  . montelukast  10 mg Oral BID  . nicotine  14 mg Transdermal Daily  . prasugrel  10 mg Oral Daily   Continuous Infusions: PRN Meds:.acetaminophen **OR** acetaminophen, ipratropium-albuterol, nitroGLYCERIN, ondansetron **OR** ondansetron (ZOFRAN) IV   Antibiotics   Anti-infectives    Start     Dose/Rate Route Frequency Ordered Stop   01/30/17 2200  azithromycin (ZITHROMAX) tablet 500 mg     500 mg Oral Every 24 hours 01/30/17 0656     01/28/17 2200  azithromycin (ZITHROMAX) 500 mg in dextrose 5 % 250 mL IVPB  Status:  Discontinued      500 mg 250 mL/hr over 60 Minutes Intravenous Daily at bedtime 01/28/17 0635 01/30/17 0656   01/28/17 2200  cefTRIAXone (ROCEPHIN) 1 g in dextrose 5 % 50 mL IVPB     1 g 100 mL/hr over 30 Minutes Intravenous Every 24 hours 01/28/17 0648     01/28/17 0130  cefTRIAXone (ROCEPHIN) 1 g in dextrose 5 % 50 mL IVPB     1 g 100 mL/hr over 30 Minutes Intravenous  Once 01/28/17 0123 01/28/17 0437   01/28/17 0130  azithromycin (ZITHROMAX) 500 mg in dextrose 5 % 250 mL IVPB     500 mg 250 mL/hr over 60 Minutes Intravenous  Once 01/28/17 0123 01/28/17 0407        Subjective:   Ronald [name] was seen and examined today. Shortness of breath and wheezing is significantly improving. No fevers or chills. No fevers or chills. Patient denies dizziness, abdominal pain, N/V/D/C, new weakness, numbess, tingling. No acute events overnight.    Objective:   Vitals:   01/30/17 0515 01/30/17 0915 01/30/17 0917 01/30/17 0919  BP: (!) 94/58   105/70  Pulse: 95   69  Resp: 18     Temp: 98.7 F (37.1 C)     TempSrc: Oral     SpO2: 100% 98% 98% 98%  Weight: 63.7 kg (140 lb 8 oz)     Height:        Intake/Output Summary (Last 24 hours) at 01/30/17 1116 Last data filed at 01/30/17 0945  Gross per 24 hour  Intake              570 ml  Output             2200 ml  Net            -1630 ml     Wt Readings from Last 3 Encounters:  01/30/17 63.7 kg (140 lb 8 oz)  10/14/15 62.5 kg (137 lb 12.8 oz)  07/06/15 60.7 kg (133 lb 14.4 oz)     Exam  General: Alert and oriented x 3, NAD  HEENT:    Neck: Supple, no JVD  Cardiovascular: S1 S2 clear, RRR  Respiratory: Minimal wheezing  Gastrointestinal: Soft, nontender, nondistended, + bowel sounds  Ext: no cyanosis clubbing or edema  Neuro: No new deficits   Skin: No rashes  Psych: Normal affect and demeanor, alert and oriented x3    Data Reviewed:  I have personally reviewed following labs and imaging studies  Micro Results Recent Results  (from the past 240 hour(s))  Blood culture (routine x 2)     Status: None (Preliminary result)   Collection Time:  01/28/17  1:40 AM  Result Value Ref Range Status   Specimen Description BLOOD RIGHT FOREARM  Final   Special Requests AEROBIC BOTTLE ONLY 5ML  Final   Culture NO GROWTH 1 DAY  Final   Report Status PENDING  Incomplete  Blood culture (routine x 2)     Status: Abnormal (Preliminary result)   Collection Time: 01/28/17  1:56 AM  Result Value Ref Range Status   Specimen Description BLOOD LEFT FOREARM  Final   Special Requests AEROBIC BOTTLE ONLY 8ML  Final   Culture  Setup Time   Final    GRAM POSITIVE COCCI IN CLUSTERS AEROBIC BOTTLE ONLY CRITICAL RESULT CALLED TO, READ BACK BY AND VERIFIED WITH: G ABBOTT,PHARMD '@0728'$  MKELLY,MLT    Culture (A)  Final    STAPHYLOCOCCUS SPECIES (COAGULASE NEGATIVE) THE SIGNIFICANCE OF ISOLATING THIS ORGANISM FROM A SINGLE SET OF BLOOD CULTURES WHEN MULTIPLE SETS ARE DRAWN IS UNCERTAIN. PLEASE NOTIFY THE MICROBIOLOGY DEPARTMENT WITHIN ONE WEEK IF SPECIATION AND SENSITIVITIES ARE REQUIRED.    Report Status PENDING  Incomplete  Blood Culture ID Panel (Reflexed)     Status: Abnormal   Collection Time: 01/28/17  1:56 AM  Result Value Ref Range Status   Enterococcus species NOT DETECTED NOT DETECTED Final   Listeria monocytogenes NOT DETECTED NOT DETECTED Final   Staphylococcus species DETECTED (A) NOT DETECTED Final    Comment: Methicillin (oxacillin) susceptible coagulase negative staphylococcus. Possible blood culture contaminant (unless isolated from more than one blood culture draw or clinical case suggests pathogenicity). No antibiotic treatment is indicated for blood  culture contaminants. CRITICAL RESULT CALLED TO, READ BACK BY AND VERIFIED WITH: G ABBOTT, PHARMD '@0728'$  01/29/17 MKELLY,MLT    Staphylococcus aureus NOT DETECTED NOT DETECTED Final   Methicillin resistance NOT DETECTED NOT DETECTED Final   Streptococcus species NOT DETECTED  NOT DETECTED Final   Streptococcus agalactiae NOT DETECTED NOT DETECTED Final   Streptococcus pneumoniae NOT DETECTED NOT DETECTED Final   Streptococcus pyogenes NOT DETECTED NOT DETECTED Final   Acinetobacter baumannii NOT DETECTED NOT DETECTED Final   Enterobacteriaceae species NOT DETECTED NOT DETECTED Final   Enterobacter cloacae complex NOT DETECTED NOT DETECTED Final   Escherichia coli NOT DETECTED NOT DETECTED Final   Klebsiella oxytoca NOT DETECTED NOT DETECTED Final   Klebsiella pneumoniae NOT DETECTED NOT DETECTED Final   Proteus species NOT DETECTED NOT DETECTED Final   Serratia marcescens NOT DETECTED NOT DETECTED Final   Haemophilus influenzae NOT DETECTED NOT DETECTED Final   Neisseria meningitidis NOT DETECTED NOT DETECTED Final   Pseudomonas aeruginosa NOT DETECTED NOT DETECTED Final   Candida albicans NOT DETECTED NOT DETECTED Final   Candida glabrata NOT DETECTED NOT DETECTED Final   Candida krusei NOT DETECTED NOT DETECTED Final   Candida parapsilosis NOT DETECTED NOT DETECTED Final   Candida tropicalis NOT DETECTED NOT DETECTED Final    Radiology Reports Dg Chest 2 View  Result Date: 01/27/2017 CLINICAL DATA:  Cough and congestion. Chest pain, shortness of breath and fatigue. EXAM: CHEST  2 VIEW COMPARISON:  10/10/2015 FINDINGS: The patient is status post left pneumonectomy. Leftward shift of the mediastinum identified. Coronary artery stents are again noted. The cardiac silhouette is largely obscured by postsurgical changes involving the left hemithorax and left hydrothorax. There is no evidence for right-sided pleural effusion or pneumothorax. Similar appearance of chronic increased interstitial markings with a lower lobe predominance. Increase interstitial and airspace opacities within the right lower lobe identified. IMPRESSION: 1. Increase interstitial and airspace opacities  within the right lower lobe which may reflect pulmonary edema or superimposed infection.  Electronically Signed   By: Kerby Moors M.D.   On: 01/27/2017 21:12    Lab Data:  CBC:  Recent Labs Lab 01/27/17 2033 01/29/17 0441 01/30/17 0538  WBC 7.8 15.5* 19.8*  HGB 12.7* 12.2* 12.2*  HCT 37.2* 35.5* 35.5*  MCV 82.9 83.7 82.8  PLT 308 304 015   Basic Metabolic Panel:  Recent Labs Lab 01/27/17 2033 01/28/17 1108 01/29/17 0441 01/30/17 0538  NA 135 134* 136 135  K 4.7 4.7 4.1 3.8  CL 104 106 104 101  CO2 21* 19* 21* 24  GLUCOSE 81 230* 131* 126*  BUN 21* 19 22* 23*  CREATININE 1.20 0.94 1.06 1.13  CALCIUM 9.5 9.3 8.9 8.6*   GFR: Estimated Creatinine Clearance: 55.3 mL/min (by C-G formula based on SCr of 1.13 mg/dL). Liver Function Tests: No results for input(s): AST, ALT, ALKPHOS, BILITOT, PROT, ALBUMIN in the last 168 hours. No results for input(s): LIPASE, AMYLASE in the last 168 hours. No results for input(s): AMMONIA in the last 168 hours. Coagulation Profile: No results for input(s): INR, PROTIME in the last 168 hours. Cardiac Enzymes:  Recent Labs Lab 01/28/17 0622 01/28/17 1108 01/28/17 1747 01/28/17 2334  TROPONINI 0.03* <0.03 <0.03 <0.03   BNP (last 3 results) No results for input(s): PROBNP in the last 8760 hours. HbA1C: No results for input(s): HGBA1C in the last 72 hours. CBG: No results for input(s): GLUCAP in the last 168 hours. Lipid Profile: No results for input(s): CHOL, HDL, LDLCALC, TRIG, CHOLHDL, LDLDIRECT in the last 72 hours. Thyroid Function Tests: No results for input(s): TSH, T4TOTAL, FREET4, T3FREE, THYROIDAB in the last 72 hours. Anemia Panel: No results for input(s): VITAMINB12, FOLATE, FERRITIN, TIBC, IRON, RETICCTPCT in the last 72 hours. Urine analysis:    Component Value Date/Time   COLORURINE YELLOW 07/04/2015 0600   APPEARANCEUR CLEAR 07/04/2015 0600   LABSPEC 1.025 07/04/2015 0600   PHURINE 5.0 07/04/2015 0600   GLUCOSEU NEGATIVE 07/04/2015 0600   HGBUR NEGATIVE 07/04/2015 0600   BILIRUBINUR NEGATIVE  07/04/2015 0600   KETONESUR NEGATIVE 07/04/2015 0600   PROTEINUR NEGATIVE 07/04/2015 0600   UROBILINOGEN 0.2 07/04/2015 0600   NITRITE NEGATIVE 07/04/2015 0600   LEUKOCYTESUR NEGATIVE 07/04/2015 0600     Kellen Hover M.D. [facility] 01/30/2017, 11:16 AM  Pager: [phone] Between 7am to 7pm - call Pager - [phone]  After 7pm go to [email or web address] - password [email or web address]  Call night coverage person covering after 7pm

## 2017-01-31 ENCOUNTER — Inpatient Hospital Stay (HOSPITAL_COMMUNITY): Payer: Medicare Other

## 2017-01-31 DIAGNOSIS — R1030 Lower abdominal pain, unspecified: Secondary | ICD-10-CM

## 2017-01-31 DIAGNOSIS — J189 Pneumonia, unspecified organism: Principal | ICD-10-CM

## 2017-01-31 LAB — CULTURE, BLOOD (ROUTINE X 2)

## 2017-01-31 LAB — BASIC METABOLIC PANEL
Anion gap: 13 (ref 5–15)
BUN: 26 mg/dL — ABNORMAL HIGH (ref 6–20)
CO2: 25 mmol/L (ref 22–32)
Calcium: 8.3 mg/dL — ABNORMAL LOW (ref 8.9–10.3)
Chloride: 98 mmol/L — ABNORMAL LOW (ref 101–111)
Creatinine, Ser: 1.22 mg/dL (ref 0.61–1.24)
GFR calc Af Amer: >60 mL/min (ref >60)
Glucose, Bld: 157 mg/dL — ABNORMAL HIGH (ref 65–99)
POTASSIUM: 4 mmol/L (ref 3.5–5.1)
Sodium: 136 mmol/L (ref 135–145)

## 2017-01-31 LAB — CBC
HEMATOCRIT: 38.7 % — AB (ref 39.0–52.0)
Hemoglobin: 13.4 g/dL (ref 13.0–17.0)
MCH: 28.9 pg (ref 26.0–34.0)
MCHC: 34.6 g/dL (ref 30.0–36.0)
MCV: 83.4 fL (ref 78.0–100.0)
PLATELETS: 334 10*3/uL (ref 150–400)
RBC: 4.64 MIL/uL (ref 4.22–5.81)
RDW: 16.1 % — ABNORMAL HIGH (ref 11.5–15.5)
WBC: 17.1 10*3/uL — ABNORMAL HIGH (ref 4.0–10.5)

## 2017-01-31 MED ORDER — MAGNESIUM CITRATE PO SOLN
1.0000 | Freq: Once | ORAL | Status: AC
Start: 2017-01-31 — End: 2017-01-31
  Administered 2017-01-31: 1 via ORAL
  Filled 2017-01-31: qty 296

## 2017-01-31 MED ORDER — TRAMADOL HCL 50 MG PO TABS: 50.0000 mg | ORAL_TABLET | Freq: Two times a day (BID) | ORAL | 0 refills | Status: DC | PRN

## 2017-01-31 MED ORDER — GUAIFENESIN ER 600 MG PO TB12: 1200.0000 mg | ORAL_TABLET | Freq: Two times a day (BID) | ORAL | 0 refills | Status: DC

## 2017-01-31 MED ORDER — PREDNISONE 10 MG PO TABS: ORAL_TABLET | ORAL | 0 refills | Status: DC

## 2017-01-31 MED ORDER — PREDNISONE 50 MG PO TABS
60.0000 mg | ORAL_TABLET | Freq: Every day | ORAL | Status: DC
  Administered 2017-01-31: 60 mg via ORAL
  Filled 2017-01-31: qty 1

## 2017-01-31 MED ORDER — ONDANSETRON 4 MG PO TBDP: 4.0000 mg | ORAL_TABLET | Freq: Three times a day (TID) | ORAL | 0 refills | Status: DC | PRN

## 2017-01-31 MED ORDER — SENNOSIDES-DOCUSATE SODIUM 8.6-50 MG PO TABS: 1.0000 | ORAL_TABLET | Freq: Two times a day (BID) | ORAL | 3 refills | Status: DC

## 2017-01-31 MED ORDER — MONTELUKAST SODIUM 10 MG PO TABS: 10.0000 mg | ORAL_TABLET | Freq: Every day | ORAL | Status: DC

## 2017-01-31 MED ORDER — GUAIFENESIN-DM 100-10 MG/5ML PO SYRP: 5.0000 mL | ORAL_SOLUTION | ORAL | 0 refills | Status: DC | PRN

## 2017-01-31 MED ORDER — GUAIFENESIN ER 600 MG PO TB12
1200.0000 mg | ORAL_TABLET | Freq: Two times a day (BID) | ORAL | Status: DC
  Administered 2017-01-31: 1200 mg via ORAL
  Filled 2017-01-31: qty 2

## 2017-01-31 MED ORDER — ZOLPIDEM TARTRATE 5 MG PO TABS
5.0000 mg | ORAL_TABLET | Freq: Once | ORAL | Status: AC
  Administered 2017-01-31: 5 mg via ORAL
  Filled 2017-01-31: qty 1

## 2017-01-31 MED ORDER — AZITHROMYCIN 500 MG PO TABS: 500.0000 mg | ORAL_TABLET | ORAL | 0 refills | Status: DC

## 2017-01-31 MED ORDER — POLYETHYLENE GLYCOL 3350 17 G PO PACK: 17.0000 g | PACK | Freq: Every day | ORAL | 0 refills | Status: DC | PRN

## 2017-01-31 MED ORDER — CEFUROXIME AXETIL 500 MG PO TABS: 500.0000 mg | ORAL_TABLET | Freq: Two times a day (BID) | ORAL | 0 refills | Status: DC

## 2017-01-31 NOTE — Discharge Summary (Signed)
Physician Discharge Summary   Patient ID: Ronald [name] MRN: [id number] DOB/AGE: 1956/01/01 61 y.o.  Admit date: 01/28/2017 Discharge date: 01/31/2017  Primary Care Physician:  Philis Fendt, MD  Discharge Diagnoses:       Acute hypoxic respiratory failure due to COPD exacerbation, CAP . CAP (community acquired pneumonia) . AKI (acute kidney injury) ([place]) . COPD with acute exacerbation ([place]) . Essential hypertension . lung ca   Constipation   Consults: None  Recommendations for Outpatient Follow-up:  1. Please repeat CBC/BMET at next visit   DIET heart healthy diet    Allergies:   Allergies  Allergen Reactions  . Penicillins Itching     DISCHARGE MEDICATIONS: Current Discharge Medication List    START taking these medications   Details  azithromycin (ZITHROMAX) 500 MG tablet Take 1 tablet (500 mg total) by mouth daily. Qty: 5 tablet, Refills: 0    cefUROXime (CEFTIN) 500 MG tablet Take 1 tablet (500 mg total) by mouth 2 (two) times daily with a meal. Qty: 10 tablet, Refills: 0    guaiFENesin (MUCINEX) 600 MG 12 hr tablet Take 2 tablets (1,200 mg total) by mouth 2 (two) times daily. Qty: 60 tablet, Refills: 0    guaiFENesin-dextromethorphan (ROBITUSSIN DM) 100-10 MG/5ML syrup Take 5 mLs by mouth every 4 (four) hours as needed for cough. Qty: 118 mL, Refills: 0    ondansetron (ZOFRAN ODT) 4 MG disintegrating tablet Take 1 tablet (4 mg total) by mouth every 8 (eight) hours as needed for nausea or vomiting. Qty: 20 tablet, Refills: 0    polyethylene glycol (MIRALAX) packet Take 17 g by mouth daily as needed for moderate constipation or severe constipation. Qty: 30 each, Refills: 0    predniSONE (DELTASONE) 10 MG tablet Prednisone dosing: Take  Prednisone '40mg'$  (4 tabs) x 3 days, then taper to '30mg'$  (3 tabs) x 3 days, then '20mg'$  (2 tabs) x 3days, then '10mg'$  (1 tab) x 3days, then OFF. Qty: 30 tablet, Refills: 0    senna-docusate (SENOKOT S) 8.6-50 MG tablet  Take 1 tablet by mouth 2 (two) times daily. For constipation Qty: 60 tablet, Refills: 3    traMADol (ULTRAM) 50 MG tablet Take 1 tablet (50 mg total) by mouth every 12 (twelve) hours as needed for severe pain. Qty: 15 tablet, Refills: 0      CONTINUE these medications which have NOT CHANGED   Details  albuterol (PROVENTIL HFA;VENTOLIN HFA) 108 (90 BASE) MCG/ACT inhaler Inhale 2 puffs into the lungs every 4 (four) hours as needed for wheezing or shortness of breath. Qty: 1 Inhaler, Refills: 0    aspirin 325 MG tablet Take 325 mg by mouth 2 (two) times a week.    budesonide-formoterol (SYMBICORT) 160-4.5 MCG/ACT inhaler Inhale 2 puffs into the lungs 2 (two) times daily.    carvedilol (COREG) 3.125 MG tablet Take 1 tablet (3.125 mg total) by mouth 2 (two) times daily with a meal. Qty: 60 tablet, Refills: 0    furosemide (LASIX) 40 MG tablet Take 1 tablet (40 mg total) by mouth 2 (two) times daily. Twice a day for 3 days, then back to normal dosing. Qty: 3 tablet, Refills: 0    montelukast (SINGULAIR) 10 MG tablet Take 1 tablet (10 mg total) by mouth at bedtime.    nitroGLYCERIN (NITROSTAT) 0.4 MG SL tablet Place 1 tablet (0.4 mg total) under the tongue every 5 (five) minutes x 3 doses as needed for chest pain. Qty: 25 tablet, Refills: 12  prasugrel (EFFIENT) 10 MG TABS tablet Take 1 tablet (10 mg total) by mouth daily. Qty: 30 tablet, Refills: 0    spironolactone (ALDACTONE) 25 MG tablet Take 1 tablet (25 mg total) by mouth daily. Qty: 30 tablet, Refills: 0         Brief H and P: For complete details please refer to admission H and P, but in briefMichael R Langleyis a 61 y.o.malewith medical history significant of HTN, HLD, CAD s/p PCI, MI, COPD, NSCLC s/p left pneumonectomy, chemotherapy, and radiation in 2004; who presents with complaints of a progressively worsening productive cough for 1 week. Patient reported productive cough with yellowish phlegm, wheezing. Found to be  tachypneic with hypoxia, O2 sats 89%. Chest x-ray with right lower lobe opacity.   Hospital Course:  Acute hypoxic respiratory failure secondary to COPD exacerbation and community-acquired pneumonia - Significantly improving today -Patient was placed on IV steroids which has been now transitioned to oral prednisone with taper at the time of discharge. Patient was placed on scheduled nebs, pulmicort, Brovana  - He was discharged on Symbicort, oral Zithromax and Ceftin. - O2 sats 95-98% on room air, ambulating in the hallways without any difficulty - Patient scheduled for outpatient follow-up with pulmonology, Dr. Vaughan Browner on 02/16/17  Community-acquired pneumonia:  - influenza panel negative - Patient was placed on a Zithromax and IV Rocephin, transitioned to oral Zithromax and Ceftin at the time of discharge. -Urine strep antigen negative  - Blood culture 1/2 methicillin susceptible coagulase-negative staph likely contaminant  Acute kidney injury:  - Patient's baseline creatinine had previously been round 0.8-0.9, however patient presents with elevated creatinine of 1.2 -Creatinine stable  Chronic systolic  and diastolic congestive heart failure: Stable. Patient does not appear to be fluid overloaded at this time. BNP appearslower than previous.  -Continue Lasix, negative balance of 1.2 L - 2-D echo showed EF of 45-50% with grade 2 diastolic dysfunction. Cath in 11/16 had shown EF of 40-45%.  Essential hypertension:  - Continue Coreg, Lasix  Coronary artery disease s/p PCI: Lastcardiac catheterization 09/2015 showing multivessel disease with EF noted to be 40-45%. - Continue Effient and ASA  History of lung cancer status post left pneumonectomy  Mildly elevated troponin - Troponins negative after the first set, 2-D echo obtained showed EF of 32-35%, grade 2 diastolic dysfunction  Constipation KUB showed large stone burden, patient was placed on stool softeners and  MiraLAX   Day of Discharge BP 122/80 (BP Location: Right Arm)   Pulse 96   Temp 97.5 F (36.4 C) (Oral)   Resp 20   Ht 5' (1.524 m)   Wt 63.8 kg (140 lb 9.6 oz)   SpO2 95%   BMI 27.46 kg/m   Physical Exam: General: Alert and awake oriented x3 not in any acute distress. HEENT: anicteric sclera, pupils reactive to light and accommodation CVS: S1-S2 clear no murmur rubs or gallops Chest: clear to auscultation bilaterally, no wheezing rales or rhonchi Abdomen: soft nontender, nondistended, normal bowel sounds Extremities: no cyanosis, clubbing or edema noted bilaterally Neuro: Cranial nerves II-XII intact, no focal neurological deficits   The results of significant diagnostics from this hospitalization (including imaging, microbiology, ancillary and laboratory) are listed below for reference.    LAB RESULTS: Basic Metabolic Panel:  Recent Labs Lab 01/30/17 0538 01/31/17 0507  NA 135 136  K 3.8 4.0  CL 101 98*  CO2 24 25  GLUCOSE 126* 157*  BUN 23* 26*  CREATININE 1.13 1.22  CALCIUM 8.6*  8.3*   Liver Function Tests: No results for input(s): AST, ALT, ALKPHOS, BILITOT, PROT, ALBUMIN in the last 168 hours. No results for input(s): LIPASE, AMYLASE in the last 168 hours. No results for input(s): AMMONIA in the last 168 hours. CBC:  Recent Labs Lab 01/30/17 0538 01/31/17 0507  WBC 19.8* 17.1*  HGB 12.2* 13.4  HCT 35.5* 38.7*  MCV 82.8 83.4  PLT 315 334   Cardiac Enzymes:  Recent Labs Lab 01/28/17 1747 01/28/17 2334  TROPONINI <0.03 <0.03   BNP: Invalid input(s): POCBNP CBG: No results for input(s): GLUCAP in the last 168 hours.  Significant Diagnostic Studies:  No results found.  2D ECHO: Study Conclusions  - Left ventricle: The cavity size was normal. Wall thickness was   increased in a pattern of mild LVH. Systolic function was mildly   reduced. The estimated ejection fraction was in the range of 45%   to 50%. Inferolateral and anterolateral  hypokinesis. Features are   consistent with a pseudonormal left ventricular filling pattern,   with concomitant abnormal relaxation and increased filling   pressure (grade 2 diastolic dysfunction). E/medial e&' > 15   suggests LV end diastolic pressure at least 20 mmHg. - Aortic valve: There was mild regurgitation. - Mitral valve: Mildly thickened, moderately calcified leaflets.   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 Relatively fixed   posterior leaflet. There was severe regurgitation. Probably no   significant stenosis. Valve area by pressure half-time: 2.72   cm^2. - Left atrium: The atrium was mildly dilated. - Right ventricle: The cavity size was mildly to moderately   dilated. Systolic function was normal. - Tricuspid valve: Peak RV-RA gradient (S): 75 mm Hg. - Pulmonary arteries: PA peak pressure: 83 mm Hg (S). - Systemic veins: IVC measured 2.0 cm with < 50% respirophasic   variation, suggesting RA pressure 8 mmHg.  Impressions:  - Normal LV size with mild LV hypertrophy. EF 45-50% with   inferolateral and anterolateral hypokinesis. Moderate diastolic   dysfunction with evidence for elevated LV filling pressure. Mild   to moderately dilated RV with normal systolic function. Mild   aortic insufficiency. Rheumatic-appearing mitral valve with   severe regurgitation, no significant stenosis. Severe pulmonary   hypertension.   Disposition and Follow-up: Discharge Instructions    Diet - low sodium heart healthy    Complete by:  As directed    Increase activity slowly    Complete by:  As directed        DISPOSITION: Home   DISCHARGE FOLLOW-UP Follow-up Information    Philis Fendt, MD. Go on 02/16/2017.   Specialty:  Internal Medicine Why:  '@10'$ :00am Contact information: [address] [place] [address] [phone]        Marshell Garfinkel, MD Follow up on 02/16/2017.   Specialty:  Pulmonary Disease Why:  at 3:45PM  Contact information: [address] [place] [address] [phone]            Time spent on Discharge: 69mns   Signed:   Aurielle Slingerland M.D. [facility] 01/31/2017, 12:50 PM Pager: [phone]

## 2017-01-31 NOTE — Progress Notes (Signed)
Pt discharge education went over at bedside with pt. Pt IV discontinued, catheter intact. Telemetry removed. Pt has all belongings including discharge paperwork and printed prescriptions. Pt discharged via wheelchair with RN   Mekaila Tarnow Leory Plowman

## 2017-01-31 NOTE — Progress Notes (Signed)
Pt states he had a BM today 01/31/2017. Pt is complaining of abdominal pain, MD aware. Pt states he feels like he is constipated. Magnesium citrate given.  Sadonna Kotara Leory Plowman

## 2017-01-31 NOTE — Progress Notes (Signed)
Occupational Therapy Treatment Patient Details Name: Ronald [name] MRN: [id number] DOB: 07-25-1956 Today's Date: 01/31/2017    History of present illness Patient is a 61 yo male admitted 01/28/17 with worsening productive cough.  Patient with acute respiratory failure with hypoxia due to COPD, CAP, tachycardia.     PMH:  HTN, HLD, CAD, MI, s/p PCI, COPD, lung CA s/p Lt pneumonectomy, chemo and radiation 2004.   OT comments  Provided pt with education on AE for LB ADLS. Pt demonstrated understanding of LB dressing with use of AE (i.e. reacher, sock aide, and long handles sponge). Pt continued to verbalize that he would rather have A from family for LB dressing stating "I don't want to feel old". Will continue to follow acutely. Recommend dc home once medically stable per physician.    Follow Up Recommendations  No OT follow up    Equipment Recommendations  Tub/shower seat    Recommendations for Other Services      Precautions / Restrictions Precautions Precautions: None Restrictions Weight Bearing Restrictions: No       Mobility Bed Mobility Overal bed mobility: Modified Independent             General bed mobility comments: Increased time  Transfers                      Balance   Sitting-balance support: No upper extremity supported;Feet supported Sitting balance-Leahy Scale: Good                             ADL Overall ADL's : Needs assistance/impaired             Lower Body Bathing: Set up;Supervison/ safety;With adaptive equipment;Sitting/lateral leans Lower Body Bathing Details (indicate cue type and reason): Educated pt on long handled sponge for LB bathing. pt verbalized interest and stated that he becomes fatigued when bathing     Lower Body Dressing: Set up;Supervision/safety;With adaptive equipment;Sit to/from stand Lower Body Dressing Details (indicate cue type and reason): Educated pt on use of sock aide and reacher for LB  ADLs               General ADL Comments: Pt continues to verbalize that he would prefer to use family for assistance stating that he doesn't want to feel old      Vision                     Perception     Praxis      Cognition   Behavior During Therapy: WFL for tasks assessed/performed Overall Cognitive Status: Within Functional Limits for tasks assessed                         Exercises     Shoulder Instructions       General Comments      Pertinent Vitals/ Pain       Pain Assessment: 0-10 Pain Score: 5  Pain Location: Abdomen Pain Descriptors / Indicators: Discomfort Pain Intervention(s): Limited activity within patient's tolerance  Home Living                                          Prior Functioning/Environment              Frequency  Min 2X/week  Progress Toward Goals  OT Goals(current goals can now be found in the care plan section)     Acute Rehab OT Goals Patient Stated Goal: To get better. To return home. OT Goal Formulation: With patient Time For Goal Achievement: 02/04/17 Potential to Achieve Goals: Good ADL Goals Pt Will Perform Grooming: with modified independence;standing Pt Will Perform Lower Body Bathing: with modified independence;with adaptive equipment;sit to/from stand Pt Will Perform Lower Body Dressing: with modified independence;with adaptive equipment;sit to/from stand Pt Will Transfer to Toilet: with modified independence;ambulating;regular height toilet Pt Will Perform Toileting - Clothing Manipulation and hygiene: with modified independence;sit to/from stand Pt Will Perform Tub/Shower Transfer: Tub transfer;with supervision;ambulating;shower seat Additional ADL Goal #1: Pt will generalize energy conservation and breathing techniques in ADL and mobility independently.  Plan Discharge plan remains appropriate    Co-evaluation                 End of Session Equipment  Utilized During Treatment: Other (comment) (AE)  OT Visit Diagnosis: Muscle weakness (generalized) (M62.81)   Activity Tolerance Patient tolerated treatment well   Patient Left in bed;with call bell/phone within reach   Nurse Communication          Time: 2957-4734 OT Time Calculation (min): 19 min  Charges: OT General Charges $OT Visit: 1 Procedure OT Treatments $Self Care/Home Management : 8-22 mins  [place], OTR/L [place] 01/31/2017, 10:36 AM

## 2017-01-31 NOTE — Progress Notes (Signed)
Physical Therapy Treatment Patient Details Name: Ronald [name] MRN: [id number] DOB: [birth year] Today's Date: 01/31/2017    History of Present Illness Patient is a 61 yo male admitted 01/28/17 with worsening productive cough.  Patient with acute respiratory failure with hypoxia due to COPD, CAP, tachycardia.     PMH:  HTN, HLD, CAD, MI, s/p PCI, COPD, lung CA s/p Lt pneumonectomy, chemo and radiation 2004.    PT Comments    Patient mobilizing well, ambulated on room air with saturations to 88% but rebounded immediately with rest. Patient educated on use of flutter valve and energy conservation for discharge home.    Follow Up Recommendations  No PT follow up;Supervision for mobility/OOB     Equipment Recommendations  None recommended by PT    Recommendations for Other Services       Precautions / Restrictions Precautions Precautions: None Restrictions Weight Bearing Restrictions: No    Mobility  Bed Mobility Overal bed mobility: Modified Independent             General bed mobility comments: Increased time  Transfers Overall transfer level: Modified independent               General transfer comment: no physical assist  Ambulation/Gait Ambulation/Gait assistance: Modified independent (Device/Increase time) Ambulation Distance (Feet): 140 Feet Assistive device: None Gait Pattern/deviations: Step-through pattern;Decreased stride length Gait velocity: decreased   General Gait Details: decreased gait speed, otherwise no assist or guarding required (amb on room air with saturations to 88%, rebounded quickly)   Stairs Stairs: Yes   Stair Management: One rail Left Number of Stairs: 2    Wheelchair Mobility    Modified Rankin (Stroke Patients Only)       Balance Overall balance assessment: Needs assistance Sitting-balance support: No upper extremity supported;Feet supported Sitting balance-Leahy Scale: Good                              Cognition Arousal/Alertness: Awake/alert Behavior During Therapy: WFL for tasks assessed/performed Overall Cognitive Status: Within Functional Limits for tasks assessed                      Exercises      General Comments        Pertinent Vitals/Pain Pain Assessment: 0-10 Pain Score: 5  Pain Location: Abdomen Pain Descriptors / Indicators: Discomfort Pain Intervention(s): Monitored during session    Home Living                      Prior Function            PT Goals (current goals can now be found in the care plan section) Acute Rehab PT Goals Patient Stated Goal: To get better. To return home. PT Goal Formulation: With patient Time For Goal Achievement: 02/04/17 Potential to Achieve Goals: Good Progress towards PT goals: Progressing toward goals    Frequency    Min 3X/week      PT Plan Current plan remains appropriate    Co-evaluation             End of Session   Activity Tolerance: Patient tolerated treatment well Patient left: in bed;with call bell/phone within reach Nurse Communication: Mobility status PT Visit Diagnosis: Muscle weakness (generalized) (M62.81);Difficulty in walking, not elsewhere classified (R26.2)     Time: 9476-5465 PT Time Calculation (min) (ACUTE ONLY): 16 min  Charges:  $Gait Training: 8-22 mins  G Codes:       Duncan Dull 12-Feb-2017, 1:51 PM  Alben Deeds, [place] DPT  [phone]

## 2017-02-02 LAB — CULTURE, BLOOD (ROUTINE X 2): CULTURE: NO GROWTH

## 2017-02-16 ENCOUNTER — Inpatient Hospital Stay: Payer: Self-pay | Admitting: Pulmonary Disease

## 2017-07-12 ENCOUNTER — Encounter (HOSPITAL_COMMUNITY): Payer: Self-pay | Admitting: Emergency Medicine

## 2017-07-12 ENCOUNTER — Emergency Department (HOSPITAL_COMMUNITY)
Admission: EM | Admit: 2017-07-12 | Discharge: 2017-07-12 | Disposition: A | Discharge status: Home / Self Care | Payer: Medicare Other | Attending: Emergency Medicine | Admitting: Emergency Medicine

## 2017-07-12 DIAGNOSIS — Z5321 Procedure and treatment not carried out due to patient leaving prior to being seen by health care provider: Secondary | ICD-10-CM | POA: Diagnosis not present

## 2017-07-12 DIAGNOSIS — R112 Nausea with vomiting, unspecified: Secondary | ICD-10-CM | POA: Insufficient documentation

## 2017-07-12 LAB — URINALYSIS, ROUTINE W REFLEX MICROSCOPIC
Bilirubin Urine: NEGATIVE
Glucose, UA: NEGATIVE mg/dL
Hgb urine dipstick: NEGATIVE
KETONES UR: NEGATIVE mg/dL
Nitrite: NEGATIVE
PH: 5 (ref 5.0–8.0)
Protein, ur: NEGATIVE mg/dL
SQUAMOUS EPITHELIAL / LPF: NONE SEEN
Specific Gravity, Urine: 1.01 (ref 1.005–1.030)

## 2017-07-12 NOTE — ED Notes (Addendum)
Pt was called for re-vitals, no answer.

## 2017-07-12 NOTE — ED Triage Notes (Signed)
Pt presents with medication reaction after being dx with STD; pt states he saw his PCP Wednesday and given ABX but now having n/v; pt reports still experiencing some burning with urination

## 2017-12-29 ENCOUNTER — Emergency Department (HOSPITAL_COMMUNITY): Payer: Medicare Other

## 2017-12-29 ENCOUNTER — Encounter (HOSPITAL_COMMUNITY): Payer: Self-pay | Admitting: Emergency Medicine

## 2017-12-29 ENCOUNTER — Inpatient Hospital Stay (HOSPITAL_COMMUNITY)
Admission: EM | Admit: 2017-12-29 | Discharge: 2018-01-04 | DRG: 286 | Disposition: A | Discharge status: Home / Self Care | Payer: Medicare Other | Attending: Family Medicine | Admitting: Family Medicine

## 2017-12-29 ENCOUNTER — Other Ambulatory Visit: Payer: Self-pay

## 2017-12-29 DIAGNOSIS — T82858A Stenosis of vascular prosthetic devices, implants and grafts, initial encounter: Secondary | ICD-10-CM | POA: Diagnosis present

## 2017-12-29 DIAGNOSIS — E785 Hyperlipidemia, unspecified: Secondary | ICD-10-CM | POA: Diagnosis present

## 2017-12-29 DIAGNOSIS — N182 Chronic kidney disease, stage 2 (mild): Secondary | ICD-10-CM | POA: Diagnosis present

## 2017-12-29 DIAGNOSIS — Z9221 Personal history of antineoplastic chemotherapy: Secondary | ICD-10-CM

## 2017-12-29 DIAGNOSIS — I130 Hypertensive heart and chronic kidney disease with heart failure and stage 1 through stage 4 chronic kidney disease, or unspecified chronic kidney disease: Principal | ICD-10-CM | POA: Diagnosis present

## 2017-12-29 DIAGNOSIS — Z85118 Personal history of other malignant neoplasm of bronchus and lung: Secondary | ICD-10-CM

## 2017-12-29 DIAGNOSIS — K529 Noninfective gastroenteritis and colitis, unspecified: Secondary | ICD-10-CM | POA: Diagnosis present

## 2017-12-29 DIAGNOSIS — R109 Unspecified abdominal pain: Secondary | ICD-10-CM

## 2017-12-29 DIAGNOSIS — I959 Hypotension, unspecified: Secondary | ICD-10-CM | POA: Diagnosis present

## 2017-12-29 DIAGNOSIS — K5900 Constipation, unspecified: Secondary | ICD-10-CM | POA: Diagnosis present

## 2017-12-29 DIAGNOSIS — J069 Acute upper respiratory infection, unspecified: Secondary | ICD-10-CM | POA: Diagnosis present

## 2017-12-29 DIAGNOSIS — Z87891 Personal history of nicotine dependence: Secondary | ICD-10-CM

## 2017-12-29 DIAGNOSIS — Z880 Allergy status to penicillin: Secondary | ICD-10-CM

## 2017-12-29 DIAGNOSIS — I5043 Acute on chronic combined systolic (congestive) and diastolic (congestive) heart failure: Secondary | ICD-10-CM | POA: Diagnosis present

## 2017-12-29 DIAGNOSIS — Z7951 Long term (current) use of inhaled steroids: Secondary | ICD-10-CM

## 2017-12-29 DIAGNOSIS — I2510 Atherosclerotic heart disease of native coronary artery without angina pectoris: Secondary | ICD-10-CM | POA: Diagnosis present

## 2017-12-29 DIAGNOSIS — Z833 Family history of diabetes mellitus: Secondary | ICD-10-CM

## 2017-12-29 DIAGNOSIS — Z8249 Family history of ischemic heart disease and other diseases of the circulatory system: Secondary | ICD-10-CM

## 2017-12-29 DIAGNOSIS — Z923 Personal history of irradiation: Secondary | ICD-10-CM

## 2017-12-29 DIAGNOSIS — Z902 Acquired absence of lung [part of]: Secondary | ICD-10-CM

## 2017-12-29 DIAGNOSIS — I252 Old myocardial infarction: Secondary | ICD-10-CM

## 2017-12-29 DIAGNOSIS — Z955 Presence of coronary angioplasty implant and graft: Secondary | ICD-10-CM

## 2017-12-29 DIAGNOSIS — Z79899 Other long term (current) drug therapy: Secondary | ICD-10-CM

## 2017-12-29 DIAGNOSIS — I081 Rheumatic disorders of both mitral and tricuspid valves: Secondary | ICD-10-CM | POA: Diagnosis present

## 2017-12-29 DIAGNOSIS — R1310 Dysphagia, unspecified: Secondary | ICD-10-CM | POA: Diagnosis present

## 2017-12-29 DIAGNOSIS — Z79891 Long term (current) use of opiate analgesic: Secondary | ICD-10-CM

## 2017-12-29 DIAGNOSIS — D649 Anemia, unspecified: Secondary | ICD-10-CM | POA: Diagnosis present

## 2017-12-29 DIAGNOSIS — I509 Heart failure, unspecified: Secondary | ICD-10-CM

## 2017-12-29 DIAGNOSIS — Y848 Other medical procedures as the cause of abnormal reaction of the patient, or of later complication, without mention of misadventure at the time of the procedure: Secondary | ICD-10-CM | POA: Diagnosis present

## 2017-12-29 DIAGNOSIS — Z7982 Long term (current) use of aspirin: Secondary | ICD-10-CM

## 2017-12-29 DIAGNOSIS — C3490 Malignant neoplasm of unspecified part of unspecified bronchus or lung: Secondary | ICD-10-CM | POA: Diagnosis present

## 2017-12-29 DIAGNOSIS — J449 Chronic obstructive pulmonary disease, unspecified: Secondary | ICD-10-CM | POA: Diagnosis present

## 2017-12-29 LAB — I-STAT CHEM 8, ED
BUN: 11 mg/dL (ref 6–20)
Calcium, Ion: 1 mmol/L — ABNORMAL LOW (ref 1.15–1.40)
Chloride: 107 mmol/L (ref 101–111)
Creatinine, Ser: 0.7 mg/dL (ref 0.61–1.24)
Glucose, Bld: 88 mg/dL (ref 65–99)
HEMATOCRIT: 39 % (ref 39.0–52.0)
HEMOGLOBIN: 13.3 g/dL (ref 13.0–17.0)
Potassium: 3.8 mmol/L (ref 3.5–5.1)
SODIUM: 140 mmol/L (ref 135–145)
TCO2: 24 mmol/L (ref 22–32)

## 2017-12-29 LAB — CBC WITH DIFFERENTIAL/PLATELET
BASOS PCT: 0 %
Basophils Absolute: 0 10*3/uL (ref 0.0–0.1)
EOS ABS: 0.1 10*3/uL (ref 0.0–0.7)
Eosinophils Relative: 2 %
HCT: 35.3 % — ABNORMAL LOW (ref 39.0–52.0)
HEMOGLOBIN: 12.1 g/dL — AB (ref 13.0–17.0)
Lymphocytes Relative: 21 %
Lymphs Abs: 1.5 10*3/uL (ref 0.7–4.0)
MCH: 28.9 pg (ref 26.0–34.0)
MCHC: 34.3 g/dL (ref 30.0–36.0)
MCV: 84.2 fL (ref 78.0–100.0)
Monocytes Absolute: 0.5 10*3/uL (ref 0.1–1.0)
Monocytes Relative: 8 %
NEUTROS PCT: 69 %
Neutro Abs: 4.9 10*3/uL (ref 1.7–7.7)
Platelets: 293 10*3/uL (ref 150–400)
RBC: 4.19 MIL/uL — AB (ref 4.22–5.81)
RDW: 15.8 % — ABNORMAL HIGH (ref 11.5–15.5)
WBC: 7.1 10*3/uL (ref 4.0–10.5)

## 2017-12-29 LAB — BASIC METABOLIC PANEL
ANION GAP: 9 (ref 5–15)
BUN: 26 mg/dL — ABNORMAL HIGH (ref 6–20)
CHLORIDE: 104 mmol/L (ref 101–111)
CO2: 20 mmol/L — ABNORMAL LOW (ref 22–32)
CREATININE: 1.21 mg/dL (ref 0.61–1.24)
Calcium: 8.8 mg/dL — ABNORMAL LOW (ref 8.9–10.3)
GFR calc non Af Amer: >60 mL/min (ref >60)
Glucose, Bld: 101 mg/dL — ABNORMAL HIGH (ref 65–99)
Potassium: 4.1 mmol/L (ref 3.5–5.1)
SODIUM: 133 mmol/L — AB (ref 135–145)

## 2017-12-29 LAB — BRAIN NATRIURETIC PEPTIDE: B Natriuretic Peptide: 475.8 pg/mL — ABNORMAL HIGH (ref 0.0–100.0)

## 2017-12-29 LAB — I-STAT TROPONIN, ED: TROPONIN I, POC: 0.02 ng/mL (ref 0.00–0.08)

## 2017-12-29 MED ORDER — SODIUM CHLORIDE 0.9 % IV BOLUS (SEPSIS)
1000.0000 mL | Freq: Once | INTRAVENOUS | Status: AC
  Administered 2017-12-29: 1000 mL via INTRAVENOUS

## 2017-12-29 NOTE — ED Provider Notes (Signed)
[facility] EMERGENCY DEPARTMENT Provider Note   CSN: [id number] Arrival date & time: 12/29/17  2105   History   Chief Complaint Chief Complaint  Patient presents with  . Shortness of Breath    HPI Ronald [name] is a 62 y.o. male who presents with SOB. PMH significant for CHF EF 45-50% (March 2018), CAD, hx of MI, COPD, hx of left pneumonectomy, hx of CAP, mitral regurg. He states over the past several days he's had gradually worsening SOB with exertion. He feels SOB with any activity but feels better with rest. He started to have some right sided chest pain a couple days ago which was cramping in nature and was associated with numbness of the left hand. This morning he developed dysphagia after eating chicken soup which he has not had before. He denies fever or URI symptoms. He has a chronic dry cough which he feels is unchanged. He's been using his nebulizers without relief. He also took a dose of his Lasix without relief. He does not smoke anymore.   Cardiologist is Dr. Terrence Dupont  HPI  Past Medical History:  Diagnosis Date  . CHF (congestive heart failure) ([place])   . COPD (chronic obstructive pulmonary disease) ([place])   . Coronary artery disease   . Hypertension   . lung ca dx'd 2005   chemo/xrt comp 2005, lung ca  . Myocardial infarction ([place])   . Shortness of breath     Patient Active Problem List   Diagnosis Date Noted  . CAP (community acquired pneumonia) 01/28/2017  . AKI (acute kidney injury) ([place]) 01/28/2017  . Unstable angina ([place]) 10/10/2015  . COPD exacerbation ([place]) 07/04/2015  . Essential hypertension 07/04/2015  . Acute respiratory failure with hypoxia ([place]) 07/04/2015  . lung ca   . Major depressive disorder, single episode 05/01/2015  . Drug overdose, intentional ([place])   . Chronic systolic CHF (congestive heart failure) 10/30/2013  . COPD with acute exacerbation ([place]) 09/16/2012  . Tobacco use 09/16/2012  . COPD (chronic obstructive  pulmonary disease) ([place]) 09/15/2012  . Status post cardiac catheterization 04/08/2012  . Acute systolic CHF (congestive heart failure) ([place]) 04/08/2012  . Tobacco abuse 04/08/2012  . Acute bronchitis 04/08/2012  . CAD (coronary artery disease) 04/06/2012  . Lung cancer ([place]) 04/06/2012  . Mitral regurgitation 04/06/2012  . Cocaine abuse ([place]) 04/06/2012    Past Surgical History:  Procedure Laterality Date  . CARDIAC CATHETERIZATION N/A 10/13/2015   Procedure: Left Heart Cath and Coronary Angiography;  Surgeon: [name], MD;  Location: [facility] CV LAB;  Service: Cardiovascular;  Laterality: N/A;  . CORONARY ANGIOPLASTY WITH STENT PLACEMENT    . LEFT HEART CATHETERIZATION WITH CORONARY ANGIOGRAM N/A 04/07/2012   Procedure: LEFT HEART CATHETERIZATION WITH CORONARY ANGIOGRAM;  Surgeon: [name], MD;  Location: [facility] CATH LAB;  Service: Cardiovascular;  Laterality: N/A;  . PNEUMONECTOMY  2005   left  . vocal cord surgery  2005     Home Medications    Prior to Admission medications   Medication Sig Start Date End Date Taking? Authorizing Provider  albuterol (PROVENTIL HFA;VENTOLIN HFA) 108 (90 BASE) MCG/ACT inhaler Inhale 2 puffs into the lungs every 4 (four) hours as needed for wheezing or shortness of breath. 10/14/13   Ward, Delice Bison, DO  aspirin 325 MG tablet Take 325 mg by mouth 2 (two) times a week.    [provider]  azithromycin (ZITHROMAX) 500 MG tablet Take 1 tablet (500 mg total) by mouth  daily. 01/31/17   Rai, Vernelle Emerald, MD  budesonide-formoterol (SYMBICORT) 160-4.5 MCG/ACT inhaler Inhale 2 puffs into the lungs 2 (two) times daily.    [provider]  carvedilol (COREG) 3.125 MG tablet Take 1 tablet (3.125 mg total) by mouth 2 (two) times daily with a meal. 07/06/15   Tat, Shanon Brow, MD  cefUROXime (CEFTIN) 500 MG tablet Take 1 tablet (500 mg total) by mouth 2 (two) times daily with a meal. 01/31/17   Rai, Ripudeep K, MD  furosemide (LASIX) 40 MG  tablet Take 1 tablet (40 mg total) by mouth 2 (two) times daily. Twice a day for 3 days, then back to normal dosing. Patient taking differently: Take 40 mg by mouth daily as needed for fluid.  05/02/15   Withrow, Elyse Jarvis, FNP  guaiFENesin (MUCINEX) 600 MG 12 hr tablet Take 2 tablets (1,200 mg total) by mouth 2 (two) times daily. 01/31/17   Rai, Ripudeep K, MD  guaiFENesin-dextromethorphan (ROBITUSSIN DM) 100-10 MG/5ML syrup Take 5 mLs by mouth every 4 (four) hours as needed for cough. 01/31/17   Rai, Ripudeep K, MD  montelukast (SINGULAIR) 10 MG tablet Take 1 tablet (10 mg total) by mouth at bedtime. 05/02/15   [place]row, Elyse Jarvis, FNP  nitroGLYCERIN (NITROSTAT) 0.4 MG SL tablet Place 1 tablet (0.4 mg total) under the tongue every 5 (five) minutes x 3 doses as needed for chest pain. 11/01/13   [name], MD  ondansetron (ZOFRAN ODT) 4 MG disintegrating tablet Take 1 tablet (4 mg total) by mouth every 8 (eight) hours as needed for nausea or vomiting. 01/31/17   Rai, Vernelle Emerald, MD  polyethylene glycol (MIRALAX) packet Take 17 g by mouth daily as needed for moderate constipation or severe constipation. 01/31/17   Rai, Ripudeep K, MD  prasugrel (EFFIENT) 10 MG TABS tablet Take 1 tablet (10 mg total) by mouth daily. 07/06/15   Orson Eva, MD  predniSONE (DELTASONE) 10 MG tablet Prednisone dosing: Take  Prednisone 40mg  (4 tabs) x 3 days, then taper to 30mg  (3 tabs) x 3 days, then 20mg  (2 tabs) x 3days, then 10mg  (1 tab) x 3days, then OFF. 01/31/17   Rai, Ripudeep K, MD  senna-docusate (SENOKOT S) 8.6-50 MG tablet Take 1 tablet by mouth 2 (two) times daily. For constipation 01/31/17   Rai, Vernelle Emerald, MD  spironolactone (ALDACTONE) 25 MG tablet Take 1 tablet (25 mg total) by mouth daily. 07/06/15   Orson Eva, MD  traMADol (ULTRAM) 50 MG tablet Take 1 tablet (50 mg total) by mouth every 12 (twelve) hours as needed for severe pain. 01/31/17   Mendel Corning, MD    Family History Family History  Problem Relation Age  of Onset  . Hypertension Other   . Diabetes Other     Social History Social History   Tobacco Use  . Smoking status: Former Smoker    Types: Cigarettes    Last attempt to quit: 09/30/2013    Years since quitting: 4.2  . Smokeless tobacco: Never Used  Substance Use Topics  . Alcohol use: No    Comment: former  . Drug use: No     Allergies   Penicillins   Review of Systems Review of Systems  Constitutional: Negative for fever.  HENT: Negative for congestion and rhinorrhea.   Respiratory: Positive for cough (chronic), shortness of breath and wheezing.   Cardiovascular: Positive for chest pain. Negative for leg swelling.  Gastrointestinal: Negative for abdominal pain, nausea and vomiting.  Neurological: Positive  for light-headedness. Negative for syncope.  All other systems reviewed and are negative.    Physical Exam Updated Vital Signs BP (!) 82/46   Pulse 74   Temp 98.6 F (37 C) (Oral)   Resp (!) 25   Wt 63.5 kg (140 lb)   SpO2 95%   BMI 27.34 kg/m   Physical Exam  Constitutional: He is oriented to person, place, and time. He appears well-developed and well-nourished. No distress.  HENT:  Head: Normocephalic and atraumatic.  Eyes: Conjunctivae are normal. Pupils are equal, round, and reactive to light. Right eye exhibits no discharge. Left eye exhibits no discharge. No scleral icterus.  Neck: Normal range of motion.  Cardiovascular: Normal rate and regular rhythm. Exam reveals no gallop and no friction rub.  Murmur heard. Pulmonary/Chest: No stridor. Tachypnea noted. No respiratory distress. He has no wheezes. He has no rales. He exhibits no tenderness.  Abdominal: Soft. Bowel sounds are normal. He exhibits no distension. There is no tenderness.  Musculoskeletal:  No lower leg edema  Neurological: He is alert and oriented to person, place, and time.  Skin: Skin is warm and dry.  Psychiatric: He has a normal mood and affect. His behavior is normal.    Nursing note and vitals reviewed.   ED Treatments / Results  Labs (all labs ordered are listed, but only abnormal results are displayed) Labs Reviewed  BASIC METABOLIC PANEL - Abnormal; Notable for the following components:      Result Value   Sodium 133 (*)    CO2 20 (*)    Glucose, Bld 101 (*)    BUN 26 (*)    Calcium 8.8 (*)    All other components within normal limits  CBC WITH DIFFERENTIAL/PLATELET - Abnormal; Notable for the following components:   RBC 4.19 (*)    Hemoglobin 12.1 (*)    HCT 35.3 (*)    RDW 15.8 (*)    All other components within normal limits  BRAIN NATRIURETIC PEPTIDE - Abnormal; Notable for the following components:   B Natriuretic Peptide 475.8 (*)    All other components within normal limits  URINALYSIS, ROUTINE W REFLEX MICROSCOPIC - Abnormal; Notable for the following components:   Color, Urine COLORLESS (*)    All other components within normal limits  I-STAT CHEM 8, ED - Abnormal; Notable for the following components:   Calcium, Ion 1.00 (*)    All other components within normal limits  CULTURE, BLOOD (ROUTINE X 2)  CULTURE, BLOOD (ROUTINE X 2)  INFLUENZA PANEL BY PCR (TYPE A & B)  I-STAT TROPONIN, ED  I-STAT CG4 LACTIC ACID, ED  I-STAT CG4 LACTIC ACID, ED    EKG  EKG Interpretation  Date/Time:  Thursday December 29 2017 21:12:51 EST Ventricular Rate:  90 PR Interval:    QRS Duration: 130 QT Interval:  401 QTC Calculation: 491 R Axis:   -112 Text Interpretation:  Sinus rhythm Nonspecific IVCD with LAD Abnrm T, consider ischemia, anterolateral lds No significant change since last tracing Confirmed by Dorie Rank [phone]) on 12/29/2017 9:15:49 PM       Radiology Dg Chest 2 View  Result Date: 12/29/2017 CLINICAL DATA:  Difficulty breathing and swallowing. EXAM: CHEST  2 VIEW COMPARISON:  01/27/2017 FINDINGS: Postpneumonectomy change redemonstrated with resected posterior left sixth and chronic deformity of the left seventh ribs.  Compensatory hyperinflation of the right lung with interval increase in pulmonary vascular congestion since prior. No significant pleural effusion. No pneumothorax. No pneumonic consolidation. No  acute osseous abnormality. Heart is obscured by volume loss ectomy. IMPRESSION: Findings compatible with CHF in the setting of prior left pneumonectomy. Electronically Signed   By: Ashley Royalty M.D.   On: 12/29/2017 22:59    Procedures Procedures (including critical care time)  Medications Ordered in ED Medications  furosemide (LASIX) injection 20 mg (not administered)  sodium chloride 0.9 % bolus 1,000 mL (0 mLs Intravenous Stopped 12/30/17 0101)  glucagon (human recombinant) (GLUCAGEN) injection 1 mg (1 mg Intravenous Given 12/30/17 0101)     Initial Impression / Assessment and Plan / ED Course  I have reviewed the triage vital signs and the nursing notes.  Pertinent labs & imaging results that were available during my care of the patient were reviewed by me and considered in my medical decision making (see chart for details).  62 year old male presents with SOB likely due to CHF exacerbation. He is hypotensive in the ED. Systolic BP are ranging from 80-90s. EKG is SR and is unchanged. CXR shows increased vascular congestion. CBC is remarkable for mild anemia. BMP is overall unremarkable. BNP is elevated from baseline (234 11 months ago, 475 today). Trop is normal. Fluids were initially ordered due to hypotension and concern for possible sepsis but cancelled after results came back. He received approximately 250cc. Glucagon was ordered and will give Lasix after blood pressure improves. Spoke with Dr. Terrence Dupont who will come to see the patient in the morning. Shared visit with Dr. Tomi Bamberger and Dr. Randal Buba. Spoke with Dr. Tamala Julian who would like to see MAP 65 before he admits. Will give another dose of Glucogon and reassess. Patient care transferred to A Harris PA-C  Final Clinical Impressions(s) / ED Diagnoses    Final diagnoses:  Acute on chronic congestive heart failure, unspecified heart failure type ([place])  Hypotension, unspecified hypotension type    ED Discharge Orders    None         Recardo Evangelist, PA-C 12/30/17 0136    Dorie Rank, MD 01/02/18 1246

## 2017-12-29 NOTE — ED Provider Notes (Signed)
Pt presents to the ED with complaints of shortness of breath and difficulty swallowing.  On my exam he has significant wheezing.  BP is low but he denies vomiting or diarrhea. No blood in his stool.  No known fevers.  Will give a fluid bolus,  Monitor for signs of sepsis, proceed with labs , xrays, further evaluation.  Medical screening examination/treatment/procedure(s) were conducted as a shared visit with non-physician practitioner(s) and myself.  I personally evaluated the patient during the encounter.   EKG Interpretation  Date/Time:  Thursday December 29 2017 21:12:51 EST Ventricular Rate:  90 PR Interval:    QRS Duration: 130 QT Interval:  401 QTC Calculation: 491 R Axis:   -112 Text Interpretation:  Sinus rhythm Nonspecific IVCD with LAD Abnrm T, consider ischemia, anterolateral lds No significant change since last tracing Confirmed by Dorie Rank [phone]) on 12/29/2017 9:15:49 PM         Dorie Rank, MD 12/29/17 2255

## 2017-12-29 NOTE — ED Triage Notes (Signed)
EMS states pt has had difficulty breathing and swallowing. Audible wheezing heard. Pt uses nebulizer at home today, 5 mg albuterol given by EMS. 324 ASA. Reported some right sided chest pain earlier, denies now. Pt takes spirolactone and lasix denies kidney issies. 93/62, 79 NSR, 96% room air, pt unlabored at rest. Labored while standing

## 2017-12-30 ENCOUNTER — Inpatient Hospital Stay (HOSPITAL_COMMUNITY): Payer: Medicare Other

## 2017-12-30 ENCOUNTER — Other Ambulatory Visit: Payer: Self-pay

## 2017-12-30 DIAGNOSIS — I509 Heart failure, unspecified: Secondary | ICD-10-CM | POA: Diagnosis not present

## 2017-12-30 DIAGNOSIS — Z87891 Personal history of nicotine dependence: Secondary | ICD-10-CM | POA: Diagnosis not present

## 2017-12-30 DIAGNOSIS — Z902 Acquired absence of lung [part of]: Secondary | ICD-10-CM

## 2017-12-30 DIAGNOSIS — I959 Hypotension, unspecified: Secondary | ICD-10-CM | POA: Diagnosis present

## 2017-12-30 DIAGNOSIS — T82858A Stenosis of vascular prosthetic devices, implants and grafts, initial encounter: Secondary | ICD-10-CM | POA: Diagnosis present

## 2017-12-30 DIAGNOSIS — Z9889 Other specified postprocedural states: Secondary | ICD-10-CM | POA: Diagnosis not present

## 2017-12-30 DIAGNOSIS — R1310 Dysphagia, unspecified: Secondary | ICD-10-CM | POA: Diagnosis not present

## 2017-12-30 DIAGNOSIS — I130 Hypertensive heart and chronic kidney disease with heart failure and stage 1 through stage 4 chronic kidney disease, or unspecified chronic kidney disease: Secondary | ICD-10-CM | POA: Diagnosis present

## 2017-12-30 DIAGNOSIS — E785 Hyperlipidemia, unspecified: Secondary | ICD-10-CM | POA: Diagnosis present

## 2017-12-30 DIAGNOSIS — J449 Chronic obstructive pulmonary disease, unspecified: Secondary | ICD-10-CM | POA: Diagnosis not present

## 2017-12-30 DIAGNOSIS — Z923 Personal history of irradiation: Secondary | ICD-10-CM | POA: Diagnosis not present

## 2017-12-30 DIAGNOSIS — C3492 Malignant neoplasm of unspecified part of left bronchus or lung: Secondary | ICD-10-CM

## 2017-12-30 DIAGNOSIS — K5900 Constipation, unspecified: Secondary | ICD-10-CM | POA: Diagnosis present

## 2017-12-30 DIAGNOSIS — Z7982 Long term (current) use of aspirin: Secondary | ICD-10-CM | POA: Diagnosis not present

## 2017-12-30 DIAGNOSIS — I361 Nonrheumatic tricuspid (valve) insufficiency: Secondary | ICD-10-CM

## 2017-12-30 DIAGNOSIS — J069 Acute upper respiratory infection, unspecified: Secondary | ICD-10-CM | POA: Diagnosis present

## 2017-12-30 DIAGNOSIS — N182 Chronic kidney disease, stage 2 (mild): Secondary | ICD-10-CM | POA: Diagnosis present

## 2017-12-30 DIAGNOSIS — Z9221 Personal history of antineoplastic chemotherapy: Secondary | ICD-10-CM | POA: Diagnosis not present

## 2017-12-30 DIAGNOSIS — I5043 Acute on chronic combined systolic (congestive) and diastolic (congestive) heart failure: Secondary | ICD-10-CM

## 2017-12-30 DIAGNOSIS — Z880 Allergy status to penicillin: Secondary | ICD-10-CM | POA: Diagnosis not present

## 2017-12-30 DIAGNOSIS — Z85118 Personal history of other malignant neoplasm of bronchus and lung: Secondary | ICD-10-CM | POA: Diagnosis not present

## 2017-12-30 DIAGNOSIS — I2510 Atherosclerotic heart disease of native coronary artery without angina pectoris: Secondary | ICD-10-CM | POA: Diagnosis present

## 2017-12-30 DIAGNOSIS — Y848 Other medical procedures as the cause of abnormal reaction of the patient, or of later complication, without mention of misadventure at the time of the procedure: Secondary | ICD-10-CM | POA: Diagnosis present

## 2017-12-30 DIAGNOSIS — Z833 Family history of diabetes mellitus: Secondary | ICD-10-CM | POA: Diagnosis not present

## 2017-12-30 DIAGNOSIS — K529 Noninfective gastroenteritis and colitis, unspecified: Secondary | ICD-10-CM | POA: Diagnosis present

## 2017-12-30 DIAGNOSIS — Z955 Presence of coronary angioplasty implant and graft: Secondary | ICD-10-CM | POA: Diagnosis not present

## 2017-12-30 DIAGNOSIS — Z8249 Family history of ischemic heart disease and other diseases of the circulatory system: Secondary | ICD-10-CM | POA: Diagnosis not present

## 2017-12-30 DIAGNOSIS — I081 Rheumatic disorders of both mitral and tricuspid valves: Secondary | ICD-10-CM | POA: Diagnosis present

## 2017-12-30 DIAGNOSIS — I5033 Acute on chronic diastolic (congestive) heart failure: Secondary | ICD-10-CM | POA: Diagnosis not present

## 2017-12-30 DIAGNOSIS — I252 Old myocardial infarction: Secondary | ICD-10-CM | POA: Diagnosis not present

## 2017-12-30 LAB — ECHOCARDIOGRAM COMPLETE: WEIGHTICAEL: 2240 [oz_av]

## 2017-12-30 LAB — CBC
HCT: 34.1 % — ABNORMAL LOW (ref 39.0–52.0)
Hemoglobin: 11.6 g/dL — ABNORMAL LOW (ref 13.0–17.0)
MCH: 28.6 pg (ref 26.0–34.0)
MCHC: 34 g/dL (ref 30.0–36.0)
MCV: 84.2 fL (ref 78.0–100.0)
PLATELETS: 315 10*3/uL (ref 150–400)
RBC: 4.05 MIL/uL — ABNORMAL LOW (ref 4.22–5.81)
RDW: 15.9 % — AB (ref 11.5–15.5)
WBC: 7 10*3/uL (ref 4.0–10.5)

## 2017-12-30 LAB — TROPONIN I
Troponin I: <0.03 ng/mL (ref <0.03)
Troponin I: <0.03 ng/mL (ref <0.03)

## 2017-12-30 LAB — STREP PNEUMONIAE URINARY ANTIGEN: Strep Pneumo Urinary Antigen: NEGATIVE

## 2017-12-30 LAB — URINALYSIS, ROUTINE W REFLEX MICROSCOPIC
BILIRUBIN URINE: NEGATIVE
Glucose, UA: NEGATIVE mg/dL
Hgb urine dipstick: NEGATIVE
KETONES UR: NEGATIVE mg/dL
Leukocytes, UA: NEGATIVE
NITRITE: NEGATIVE
PROTEIN: NEGATIVE mg/dL
Specific Gravity, Urine: 1.005 (ref 1.005–1.030)
pH: 5 (ref 5.0–8.0)

## 2017-12-30 LAB — PROCALCITONIN

## 2017-12-30 LAB — MAGNESIUM: MAGNESIUM: 2.1 mg/dL (ref 1.7–2.4)

## 2017-12-30 LAB — PHOSPHORUS: Phosphorus: 3.6 mg/dL (ref 2.5–4.6)

## 2017-12-30 LAB — I-STAT CG4 LACTIC ACID, ED: LACTIC ACID, VENOUS: 0.97 mmol/L (ref 0.5–1.9)

## 2017-12-30 LAB — INFLUENZA PANEL BY PCR (TYPE A & B)
INFLAPCR: NEGATIVE
INFLBPCR: NEGATIVE

## 2017-12-30 LAB — MRSA PCR SCREENING: MRSA by PCR: NEGATIVE

## 2017-12-30 MED ORDER — SODIUM CHLORIDE 0.9% FLUSH: 3.0000 mL | INTRAVENOUS | Status: DC | PRN

## 2017-12-30 MED ORDER — IPRATROPIUM-ALBUTEROL 0.5-2.5 (3) MG/3ML IN SOLN: 3.0000 mL | RESPIRATORY_TRACT | Status: DC | PRN

## 2017-12-30 MED ORDER — RAMIPRIL 1.25 MG PO CAPS
1.2500 mg | ORAL_CAPSULE | Freq: Every day | ORAL | Status: DC
  Administered 2017-12-31 – 2018-01-04 (×4): 1.25 mg via ORAL
  Filled 2017-12-30 (×6): qty 1

## 2017-12-30 MED ORDER — FUROSEMIDE 10 MG/ML IJ SOLN: 20.0000 mg | Freq: Once | INTRAMUSCULAR | Status: DC

## 2017-12-30 MED ORDER — MONTELUKAST SODIUM 10 MG PO TABS
10.0000 mg | ORAL_TABLET | Freq: Every day | ORAL | Status: DC
  Administered 2017-12-30 – 2018-01-03 (×5): 10 mg via ORAL
  Filled 2017-12-30 (×5): qty 1

## 2017-12-30 MED ORDER — FLEET ENEMA 7-19 GM/118ML RE ENEM
1.0000 | ENEMA | Freq: Once | RECTAL | Status: AC
  Administered 2017-12-31: 1 via RECTAL
  Filled 2017-12-30: qty 1

## 2017-12-30 MED ORDER — GLUCAGON HCL RDNA (DIAGNOSTIC) 1 MG IJ SOLR
1.0000 mg | Freq: Once | INTRAMUSCULAR | Status: AC
  Administered 2017-12-30: 1 mg via INTRAVENOUS
  Filled 2017-12-30: qty 1

## 2017-12-30 MED ORDER — ZOLPIDEM TARTRATE 5 MG PO TABS
5.0000 mg | ORAL_TABLET | Freq: Once | ORAL | Status: AC
  Administered 2017-12-31: 5 mg via ORAL
  Filled 2017-12-30 (×2): qty 1

## 2017-12-30 MED ORDER — SODIUM CHLORIDE 0.9 % IV SOLN: 1.0000 g | Freq: Once | INTRAVENOUS | Status: DC

## 2017-12-30 MED ORDER — KETOROLAC TROMETHAMINE 15 MG/ML IJ SOLN
15.0000 mg | Freq: Three times a day (TID) | INTRAMUSCULAR | Status: DC | PRN
  Administered 2017-12-30 – 2018-01-03 (×6): 15 mg via INTRAVENOUS
  Filled 2017-12-30 (×6): qty 1

## 2017-12-30 MED ORDER — ARFORMOTEROL TARTRATE 15 MCG/2ML IN NEBU
15.0000 ug | INHALATION_SOLUTION | Freq: Two times a day (BID) | RESPIRATORY_TRACT | Status: DC
  Administered 2017-12-30 – 2018-01-04 (×10): 15 ug via RESPIRATORY_TRACT
  Filled 2017-12-30 (×11): qty 2

## 2017-12-30 MED ORDER — SIMETHICONE 40 MG/0.6ML PO SUSP
40.0000 mg | Freq: Four times a day (QID) | ORAL | Status: DC | PRN
  Administered 2017-12-31 (×2): 40 mg via ORAL
  Filled 2017-12-30 (×3): qty 0.6

## 2017-12-30 MED ORDER — AZITHROMYCIN 250 MG PO TABS: 500.0000 mg | ORAL_TABLET | Freq: Once | ORAL | Status: DC

## 2017-12-30 MED ORDER — DOCUSATE SODIUM 100 MG PO CAPS
100.0000 mg | ORAL_CAPSULE | Freq: Every day | ORAL | Status: DC | PRN
  Administered 2017-12-30: 100 mg via ORAL
  Filled 2017-12-30: qty 1

## 2017-12-30 MED ORDER — BUDESONIDE 0.5 MG/2ML IN SUSP
0.5000 mg | Freq: Two times a day (BID) | RESPIRATORY_TRACT | Status: DC
  Administered 2017-12-30 – 2018-01-04 (×10): 0.5 mg via RESPIRATORY_TRACT
  Filled 2017-12-30 (×11): qty 2

## 2017-12-30 MED ORDER — SODIUM CHLORIDE 0.9 % IV SOLN
500.0000 mg | Freq: Every day | INTRAVENOUS | Status: DC
  Administered 2017-12-30: 500 mg via INTRAVENOUS
  Filled 2017-12-30: qty 500

## 2017-12-30 MED ORDER — SODIUM CHLORIDE 0.9 % IV SOLN: 250.0000 mL | INTRAVENOUS | Status: DC | PRN

## 2017-12-30 MED ORDER — ASPIRIN EC 81 MG PO TBEC
81.0000 mg | DELAYED_RELEASE_TABLET | Freq: Every day | ORAL | Status: DC
  Administered 2017-12-31 – 2018-01-04 (×5): 81 mg via ORAL
  Filled 2017-12-30 (×5): qty 1

## 2017-12-30 MED ORDER — SODIUM CHLORIDE 0.9 % IV SOLN
1.0000 g | Freq: Every day | INTRAVENOUS | Status: DC
  Administered 2017-12-30: 1 g via INTRAVENOUS
  Filled 2017-12-30: qty 10

## 2017-12-30 MED ORDER — ENOXAPARIN SODIUM 40 MG/0.4ML ~~LOC~~ SOLN
40.0000 mg | SUBCUTANEOUS | Status: DC
  Administered 2017-12-30 – 2018-01-01 (×3): 40 mg via SUBCUTANEOUS
  Filled 2017-12-30 (×4): qty 0.4

## 2017-12-30 MED ORDER — NITROGLYCERIN 2 % TD OINT
0.5000 [in_us] | TOPICAL_OINTMENT | Freq: Three times a day (TID) | TRANSDERMAL | Status: DC
  Administered 2017-12-31 – 2018-01-02 (×8): 0.5 [in_us] via TOPICAL
  Filled 2017-12-30: qty 30

## 2017-12-30 MED ORDER — ACETAMINOPHEN 325 MG PO TABS: 650.0000 mg | ORAL_TABLET | ORAL | Status: DC | PRN

## 2017-12-30 MED ORDER — GUAIFENESIN ER 600 MG PO TB12: 1200.0000 mg | ORAL_TABLET | Freq: Two times a day (BID) | ORAL | Status: DC | PRN

## 2017-12-30 MED ORDER — PRASUGREL HCL 10 MG PO TABS
10.0000 mg | ORAL_TABLET | Freq: Every day | ORAL | Status: DC
  Administered 2017-12-30 – 2018-01-04 (×6): 10 mg via ORAL
  Filled 2017-12-30 (×7): qty 1

## 2017-12-30 MED ORDER — ONDANSETRON HCL 4 MG/2ML IJ SOLN
4.0000 mg | Freq: Four times a day (QID) | INTRAMUSCULAR | Status: DC | PRN
  Administered 2017-12-30: 4 mg via INTRAVENOUS
  Filled 2017-12-30: qty 2

## 2017-12-30 MED ORDER — POLYETHYLENE GLYCOL 3350 17 G PO PACK
17.0000 g | PACK | Freq: Every day | ORAL | Status: DC
  Administered 2017-12-30 – 2018-01-01 (×3): 17 g via ORAL
  Filled 2017-12-30 (×3): qty 1

## 2017-12-30 MED ORDER — SODIUM CHLORIDE 0.9% FLUSH
3.0000 mL | Freq: Two times a day (BID) | INTRAVENOUS | Status: DC
  Administered 2017-12-30 – 2018-01-04 (×7): 3 mL via INTRAVENOUS

## 2017-12-30 NOTE — Progress Notes (Signed)
Patient has been having severe constipation, warm prune juice was given tonight.  Patient felt nauseated and was given zofran for symptoms.  Patient requesting to have an enema.  Text MD regarding this request.  I will keep monitoring the patient.

## 2017-12-30 NOTE — ED Notes (Signed)
Pt eating breakfast at this time.  

## 2017-12-30 NOTE — ED Notes (Signed)
Admitting at bedside 

## 2017-12-30 NOTE — Progress Notes (Signed)
2D Echocardiogram has been performed.   12/30/2017, 2:43 PM

## 2017-12-30 NOTE — Progress Notes (Addendum)
  PROGRESS NOTE  Patient admitted earlier this morning. See H&P. Ronald [name] is a 62 y.o. male with medical history significant of combined systolic and diastolic CHF last EF 58-09% with grade 2 diastolic dysfunction, HTN, CAD s/p PCI, and history of lung cancer status post chemo/radiation and pneumonectomy; who presents with complaints of progressively worsening shortness of breath over the last 2 days.  He complains of a cough, but reports that it is hard for him to get anything up.  He reports utilizing Lasix on an as-needed basis.  He also complains of substernal sharp chest pain that is most notable with eating solid foods. In the ED, his blood pressure was low with CXR concerning for CHF. PCCM was consulted due to hypotension. Cardiology consulted for CHF.   SBP in 90s, will be admitted to stepdown unit for close monitoring Troponin x 3 negative BNP 475.8  Echo pending Cardiology appreciated  Stop antibiotics, no focal infiltrate, procalcitonin negative, he is afebrile with normal WBC  He admits to odynophagia with solid foods for 3 days. Once hemodynamically stable, will need GI consultation, could pursue as outpatient as this has been a short course of history of odynophagia without alarm symptoms. He was seen by [facility]/Dr. Oletta Lamas in 2017 for colonoscopy. Could also eval with CT chest/abdomen once clinically stable.    Dessa Phi, DO [facility] [email or web address] Password [email or web address] 12/30/2017, 1:52 PM

## 2017-12-30 NOTE — Consult Note (Signed)
Reason for Consult:decompensated congestive heart failure Referring Physician: [name]  Ronald [name] is an 62 y.o. male.  Ronald [name] is 62 year old male with past medical history significant for coronary artery disease status post multivessel PCI in the past to LAD, left circumflex and ramus branch, valvular heart disease with moderate mitral regurgitation, history of recurrent congestive heart failure secondary to valvular heart disease/systolic dysfunction, hypertension, hyperlipidemia, COPD, history of non-small cell CA of the lung status post left pneumonectomy in the past, tobacco abuse, history of cocaine abuse in the past, came to the ER complaining of progressive increasing shortness of breath for last 2 days associated with right-sided pleuritic chest pain and left arm numbness. Patient initially did not seek any medical attention but as symptoms got progressively worst so decided to come to ED. Patient was noted to be hypotensive EKG showed normal sinus rhythm with poor R-wave progression in V1 to V3 and T wave inversion in lateral leads first set of cardiac enzymes were negative and patient was noted to have elevated BNP. Patient's blood pressure stays etween 90s to low 100.  Past Medical History:  Diagnosis Date  . CHF (congestive heart failure) ([place])   . COPD (chronic obstructive pulmonary disease) ([place])   . Coronary artery disease   . Hypertension   . lung ca dx'd 2005   chemo/xrt comp 2005, lung ca  . Myocardial infarction ([name])   . Shortness of breath     Past Surgical History:  Procedure Laterality Date  . CARDIAC CATHETERIZATION N/A 10/13/2015   Procedure: Left Heart Cath and Coronary Angiography;  Surgeon: [name], MD;  Location: [facility];  Service: Cardiovascular;  Laterality: N/A;  . CORONARY ANGIOPLASTY WITH STENT PLACEMENT    . LEFT HEART CATHETERIZATION WITH CORONARY ANGIOGRAM N/A 04/07/2012   Procedure: LEFT HEART CATHETERIZATION WITH  CORONARY ANGIOGRAM;  Surgeon: [name], MD;  Location: [facility] CATH LAB;  Service: Cardiovascular;  Laterality: N/A;  . PNEUMONECTOMY  2005   left  . vocal cord surgery  2005    Family History  Problem Relation Age of Onset  . Hypertension Other   . Diabetes Other     Social History:  reports that he quit smoking about 4 years ago. His smoking use included cigarettes. he has never used smokeless tobacco. He reports that he does not drink alcohol or use drugs.  Allergies:  Allergies  Allergen Reactions  . Penicillins Itching    Medications: I have reviewed the patient's current medications.  Results for orders placed or performed during the hospital encounter of 12/29/17 (from the past 48 hour(s))  Influenza panel by PCR (type A & B)     Status: None   Collection Time: 12/29/17 10:27 PM  Result Value Ref Range   Influenza A By PCR NEGATIVE NEGATIVE   Influenza B By PCR NEGATIVE NEGATIVE    Comment: (NOTE) The Xpert Xpress Flu assay is intended as an aid in the diagnosis of  influenza and should not be used as a sole basis for treatment.  This  assay is FDA approved for nasopharyngeal swab specimens only. Nasal  washings and aspirates are unacceptable for Xpert Xpress Flu testing. Performed at [facility], [place] [address], [place], [place] [address]   Basic metabolic panel     Status: Abnormal   Collection Time: 12/29/17 10:46 PM  Result Value Ref Range   Sodium 133 (L) 135 - 145 mmol/L   Potassium 4.1 3.5 - 5.1 mmol/L  Chloride 104 101 - 111 mmol/L   CO2 20 (L) 22 - 32 mmol/L   Glucose, Bld 101 (H) 65 - 99 mg/dL   BUN 26 (H) 6 - 20 mg/dL   Creatinine, Ser 1.21 0.61 - 1.24 mg/dL   Calcium 8.8 (L) 8.9 - 10.3 mg/dL   GFR calc non Af Amer >60 >60 mL/min   GFR calc Af Amer >60 >60 mL/min    Comment: (NOTE) The eGFR has been calculated using the CKD EPI equation. This calculation has not been validated in all clinical situations. eGFR's persistently <60 mL/min  signify possible Chronic Kidney Disease.    Anion gap 9 5 - 15    Comment: Performed at [facility] [address] [place], [place] [address]  CBC with Differential     Status: Abnormal   Collection Time: 12/29/17 10:46 PM  Result Value Ref Range   WBC 7.1 4.0 - 10.5 K/uL   RBC 4.19 (L) 4.22 - 5.81 MIL/uL   Hemoglobin 12.1 (L) 13.0 - 17.0 g/dL   HCT 35.3 (L) 39.0 - 52.0 %   MCV 84.2 78.0 - 100.0 fL   MCH 28.9 26.0 - 34.0 pg   MCHC 34.3 30.0 - 36.0 g/dL   RDW 15.8 (H) 11.5 - 15.5 %   Platelets 293 150 - 400 K/uL   Neutrophils Relative % 69 %   Neutro Abs 4.9 1.7 - 7.7 K/uL   Lymphocytes Relative 21 %   Lymphs Abs 1.5 0.7 - 4.0 K/uL   Monocytes Relative 8 %   Monocytes Absolute 0.5 0.1 - 1.0 K/uL   Eosinophils Relative 2 %   Eosinophils Absolute 0.1 0.0 - 0.7 K/uL   Basophils Relative 0 %   Basophils Absolute 0.0 0.0 - 0.1 K/uL    Comment: Performed at [facility] [address], [place], [place] [address]  Brain natriuretic peptide     Status: Abnormal   Collection Time: 12/29/17 10:46 PM  Result Value Ref Range   B Natriuretic Peptide 475.8 (H) 0.0 - 100.0 pg/mL    Comment: Performed at [facility] [address], [place], [place] [address]  I-stat troponin, ED     Status: None   Collection Time: 12/29/17 11:00 PM  Result Value Ref Range   Troponin i, poc 0.02 0.00 - 0.08 ng/mL   Comment 3            Comment: Due to the release kinetics of cTnI, a negative result within the first hours of the onset of symptoms does not rule out myocardial infarction with certainty. If myocardial infarction is still suspected, repeat the test at appropriate intervals.   I-stat chem 8, ed     Status: Abnormal   Collection Time: 12/29/17 11:00 PM  Result Value Ref Range   Sodium 140 135 - 145 mmol/L   Potassium 3.8 3.5 - 5.1 mmol/L   Chloride 107 101 - 111 mmol/L   BUN 11 6 - 20 mg/dL   Creatinine, Ser 0.70 0.61 - 1.24 mg/dL   Glucose, Bld 88 65 - 99 mg/dL    Calcium, Ion 1.00 (L) 1.15 - 1.40 mmol/L   TCO2 24 22 - 32 mmol/L   Hemoglobin 13.3 13.0 - 17.0 g/dL   HCT 39.0 39.0 - 52.0 %  Urinalysis, Routine w reflex microscopic     Status: Abnormal   Collection Time: 12/30/17 12:21 AM  Result Value Ref Range   Color, Urine COLORLESS (A) YELLOW  APPearance CLEAR CLEAR   Specific Gravity, Urine 1.005 1.005 - 1.030   pH 5.0 5.0 - 8.0   Glucose, UA NEGATIVE NEGATIVE mg/dL   Hgb urine dipstick NEGATIVE NEGATIVE   Bilirubin Urine NEGATIVE NEGATIVE   Ketones, ur NEGATIVE NEGATIVE mg/dL   Protein, ur NEGATIVE NEGATIVE mg/dL   Nitrite NEGATIVE NEGATIVE   Leukocytes, UA NEGATIVE NEGATIVE    Comment: Performed at [facility] [address], [place], [place] [address]  Strep pneumoniae urinary antigen     Status: None   Collection Time: 12/30/17 12:28 AM  Result Value Ref Range   Strep Pneumo Urinary Antigen NEGATIVE NEGATIVE    Comment:        Infection due to S. pneumoniae cannot be absolutely ruled out since the antigen present may be below the detection limit of the test. Performed at [facility], [place] [address] [place], [place] [address]   I-Stat CG4 Lactic Acid, ED     Status: None   Collection Time: 12/30/17  1:14 AM  Result Value Ref Range   Lactic Acid, Venous 0.97 0.5 - 1.9 mmol/L  Troponin I     Status: None   Collection Time: 12/30/17  3:08 AM  Result Value Ref Range   Troponin I <0.03 <0.03 ng/mL    Comment: Performed at [facility] [address] [place], [place] [address]  CBC     Status: Abnormal   Collection Time: 12/30/17  5:48 AM  Result Value Ref Range   WBC 7.0 4.0 - 10.5 K/uL   RBC 4.05 (L) 4.22 - 5.81 MIL/uL   Hemoglobin 11.6 (L) 13.0 - 17.0 g/dL   HCT 34.1 (L) 39.0 - 52.0 %   MCV 84.2 78.0 - 100.0 fL   MCH 28.6 26.0 - 34.0 pg   MCHC 34.0 30.0 - 36.0 g/dL   RDW 15.9 (H) 11.5 - 15.5 %   Platelets 315 150 - 400 K/uL    Comment: Performed at [facility], [place] [address],  [place], [place] [address]  Magnesium     Status: None   Collection Time: 12/30/17  5:48 AM  Result Value Ref Range   Magnesium 2.1 1.7 - 2.4 mg/dL    Comment: Performed at [facility] [address] [place], [place] [address]  Phosphorus     Status: None   Collection Time: 12/30/17  5:48 AM  Result Value Ref Range   Phosphorus 3.6 2.5 - 4.6 mg/dL    Comment: Performed at [facility] [address] [place], [place] [address]  Procalcitonin     Status: None   Collection Time: 12/30/17  5:48 AM  Result Value Ref Range   Procalcitonin <0.10 ng/mL    Comment:        Interpretation: PCT (Procalcitonin) <= 0.5 ng/mL: Systemic infection (sepsis) is not likely. Local bacterial infection is possible. (NOTE)       Sepsis PCT Algorithm           Lower Respiratory Tract                                      Infection PCT Algorithm    ----------------------------     ----------------------------         PCT < 0.25 ng/mL  PCT < 0.10 ng/mL         Strongly encourage             Strongly discourage   discontinuation of antibiotics    initiation of antibiotics    ----------------------------     -----------------------------       PCT 0.25 - 0.50 ng/mL            PCT 0.10 - 0.25 ng/mL               OR       >80% decrease in PCT            Discourage initiation of                                            antibiotics      Encourage discontinuation           of antibiotics    ----------------------------     -----------------------------         PCT >= 0.50 ng/mL              PCT 0.26 - 0.50 ng/mL               AND        <80% decrease in PCT             Encourage initiation of                                             antibiotics       Encourage continuation           of antibiotics    ----------------------------     -----------------------------        PCT >= 0.50 ng/mL                  PCT > 0.50 ng/mL               AND         increase in PCT                   Strongly encourage                                      initiation of antibiotics    Strongly encourage escalation           of antibiotics                                     -----------------------------                                           PCT <= 0.25 ng/mL                                                 OR                                        >  80% decrease in PCT                                     Discontinue / Do not initiate                                             antibiotics Performed at [facility], [place] [address], [place], [place] [address]   Troponin I     Status: None   Collection Time: 12/30/17  9:52 AM  Result Value Ref Range   Troponin I <0.03 <0.03 ng/mL    Comment: Performed at [facility] [address], [place], [place] [address]    Dg Chest 2 View  Result Date: 12/29/2017 CLINICAL DATA:  Difficulty breathing and swallowing. EXAM: CHEST  2 VIEW COMPARISON:  01/27/2017 FINDINGS: Postpneumonectomy change redemonstrated with resected posterior left sixth and chronic deformity of the left seventh ribs. Compensatory hyperinflation of the right lung with interval increase in pulmonary vascular congestion since prior. No significant pleural effusion. No pneumothorax. No pneumonic consolidation. No acute osseous abnormality. Heart is obscured by volume loss ectomy. IMPRESSION: Findings compatible with CHF in the setting of prior left pneumonectomy. Electronically Signed   By: Ashley Royalty M.D.   On: 12/29/2017 22:59    Review of Systems  Constitutional: Negative for chills and fever.  Eyes: Negative for blurred vision.  Respiratory: Positive for cough and shortness of breath.   Cardiovascular: Positive for chest pain.  Gastrointestinal: Negative for nausea and vomiting.  Genitourinary: Negative for dysuria.  Skin: Negative for rash.  Neurological: Negative for dizziness and speech change.   Blood pressure 100/65, pulse 83, temperature 98.6 F  (37 C), temperature source Oral, resp. rate (!) 22, weight 63.5 kg (140 lb), SpO2 97 %. Physical Exam  Constitutional: He is oriented to person, place, and time.  HENT:  Head: Normocephalic and atraumatic.  Eyes: Conjunctivae are normal. Pupils are equal, round, and reactive to light. Left eye exhibits no discharge. No scleral icterus.  Neck: Normal range of motion. Neck supple. JVD present. No tracheal deviation present. No thyromegaly present.  Cardiovascular: Normal rate and regular rhythm.  Murmur (3/6 systolic murmur in precordial region radiating to the axilla noted. S3 gallop noted) heard. Respiratory:  Faint rales noted right lung field  GI: Soft. Bowel sounds are normal. He exhibits no distension.  Musculoskeletal: He exhibits edema. He exhibits no tenderness or deformity.  Neurological: He is alert and oriented to person, place, and time.    Assessment/Plan: Acute decompensated congestive heart failure secondary to systolic dysfunction/valvular heart disease rule out ischemia Multivessel CAD status post PCI to LAD left circumflex and ramus in the past Status post atypical chest pain Hypertension Hyperlipidemia COPD History of tobacco abuse History of non-small cell C of left lung status post pneumonectomy Acute bronchitis rule out pneumonia Plan Agree with present management Start aspirin 81 mg daily startNitropaste half-inch every 8 hours hold if blood pressure below 90 systolic Restart ramipril 1.25 mg daily Check 2-D echo to check LV systolic/diastolic function check for MR [name] 12/30/2017, 12:17 PM

## 2017-12-30 NOTE — ED Notes (Signed)
Notified pharmacy for medications

## 2017-12-30 NOTE — ED Notes (Signed)
Notified pharmacy for pt medications

## 2017-12-30 NOTE — H&P (Signed)
History and Physical    EAVEN SCHWAGER MVH:[id number] DOB: 24-Sep-1956 DOA: 12/29/2017  Referring MD/NP/PA:  PCP: [name], MD  Patient coming from: home  Chief Complaint: Shortness of breath  I have personally briefly reviewed patient's old medical records in [place]   HPI: OLDEN KLAUER is a 62 y.o. male with medical history significant of combined systolic and diastolic CHF last EF 84-13% with grade 2 diastolic dysfunction, HTN, CAD, and history of lung cancer status post chemo/radiation and pneumonectomy; who presents with complaints of progressively worsening shortness of breath over the last 2 days.  He complains of a cough, but reports that it is hard for him to get anything up.  He reports utilizing Lasix on an as-needed basis.  He also complains of substernal sharp chest pain that is most notable with eating. Associated symptoms include complaints of numbness and orthopnea.  Denies any diaphoresis, fever, diarrhea, nausea, vomiting, or leg/abdominal swelling.    ED Course: Admission into the emergency department patient was noted to be afebrile, pulse 72-88, respiration 15-29, blood pressure 82/46-109/60, O2 saturation 95-99% on room air.  Patient was given initially approximately 400 mL of normal saline IV fluids questioning sepsis.  However, labs revealed elevated BNP of 475.8 and chest x-ray was concerning for CHF with pulmonary edema.  Influenza screen was negative due to soft blood pressures patient was given glucagon to try and reverse any beta blockade.  Dr. Terrence Dupont cardiology was consult and reports will see patient in a.m. recommending placing on Levophed or dopamine temporarily if needed.  Review of Systems  Constitutional: Positive for malaise/fatigue. Negative for chills and fever.  HENT: Positive for sore throat.   Eyes: Negative for double vision and photophobia.  Respiratory: Positive for cough, shortness of breath and wheezing.   Cardiovascular:  Positive for chest pain. Negative for leg swelling.  Gastrointestinal: Negative for abdominal pain, diarrhea and nausea.  Genitourinary: Negative for dysuria and hematuria.  Musculoskeletal: Negative for falls.  Neurological: Positive for sensory change. Negative for focal weakness and seizures.    Past Medical History:  Diagnosis Date  . CHF (congestive heart failure) ([place])   . COPD (chronic obstructive pulmonary disease) ([place])   . Coronary artery disease   . Hypertension   . lung ca dx'd 2005   chemo/xrt comp 2005, lung ca  . Myocardial infarction ([place])   . Shortness of breath     Past Surgical History:  Procedure Laterality Date  . CARDIAC CATHETERIZATION N/A 10/13/2015   Procedure: Left Heart Cath and Coronary Angiography;  Surgeon: [name], MD;  Location: [facility];  Service: Cardiovascular;  Laterality: N/A;  . CORONARY ANGIOPLASTY WITH STENT PLACEMENT    . LEFT HEART CATHETERIZATION WITH CORONARY ANGIOGRAM N/A 04/07/2012   Procedure: LEFT HEART CATHETERIZATION WITH CORONARY ANGIOGRAM;  Surgeon: [name], MD;  Location: [facility] CATH [facility];  Service: Cardiovascular;  Laterality: N/A;  . PNEUMONECTOMY  2005   left  . vocal cord surgery  2005     reports that he quit smoking about 4 years ago. His smoking use included cigarettes. he has never used smokeless tobacco. He reports that he does not drink alcohol or use drugs.  Allergies  Allergen Reactions  . Penicillins Itching    Family History  Problem Relation Age of Onset  . Hypertension Other   . Diabetes Other     Prior to Admission medications   Medication Sig Start Date End Date Taking? Authorizing Provider  albuterol (PROVENTIL HFA;VENTOLIN HFA) 108 (90 BASE) MCG/ACT inhaler Inhale 2 puffs into the lungs every 4 (four) hours as needed for wheezing or shortness of breath. 10/14/13  Yes Ward, Delice Bison, DO  aspirin 325 MG tablet Take 325 mg by mouth 2 (two) times a week.   Yes [provider]  budesonide-formoterol (SYMBICORT) 160-4.5 MCG/ACT inhaler Inhale 2 puffs into the lungs 2 (two) times daily.   Yes [provider]  carvedilol (COREG) 3.125 MG tablet Take 1 tablet (3.125 mg total) by mouth 2 (two) times daily with a meal. 07/06/15  Yes Tat, Shanon Brow, MD  furosemide (LASIX) 40 MG tablet Take 1 tablet (40 mg total) by mouth 2 (two) times daily. Twice a day for 3 days, then back to normal dosing. Patient taking differently: Take 40 mg by mouth daily as needed for fluid.  05/02/15  Yes Withrow, Elyse Jarvis, FNP  guaiFENesin (MUCINEX) 600 MG 12 hr tablet Take 2 tablets (1,200 mg total) by mouth 2 (two) times daily. Patient taking differently: Take 1,200 mg by mouth 2 (two) times daily as needed for cough or to loosen phlegm.  01/31/17  Yes Rai, Ripudeep K, MD  guaiFENesin-dextromethorphan (ROBITUSSIN DM) 100-10 MG/5ML syrup Take 5 mLs by mouth every 4 (four) hours as needed for cough. 01/31/17  Yes Rai, Ripudeep K, MD  montelukast (SINGULAIR) 10 MG tablet Take 1 tablet (10 mg total) by mouth at bedtime. 05/02/15  Yes Withrow, Elyse Jarvis, FNP  nitroGLYCERIN (NITROSTAT) 0.4 MG SL tablet Place 1 tablet (0.4 mg total) under the tongue every 5 (five) minutes x 3 doses as needed for chest pain. 11/01/13  Yes [name], MD  ondansetron (ZOFRAN ODT) 4 MG disintegrating tablet Take 1 tablet (4 mg total) by mouth every 8 (eight) hours as needed for nausea or vomiting. 01/31/17  Yes Rai, Ripudeep K, MD  polyethylene glycol (MIRALAX) packet Take 17 g by mouth daily as needed for moderate constipation or severe constipation. 01/31/17  Yes Rai, Ripudeep K, MD  prasugrel (EFFIENT) 10 MG TABS tablet Take 1 tablet (10 mg total) by mouth daily. 07/06/15  Yes Tat, Shanon Brow, MD  spironolactone (ALDACTONE) 25 MG tablet Take 1 tablet (25 mg total) by mouth daily. 07/06/15  Yes Tat, Shanon Brow, MD  traMADol (ULTRAM) 50 MG tablet Take 1 tablet (50 mg total) by mouth every 12 (twelve) hours as needed for severe pain. 01/31/17   Yes Rai, Ripudeep Raliegh Ip, MD    Physical Exam:  Constitutional: Older male who appears to be no acute distress while resting. Vitals:   12/29/17 2300 12/30/17 0015 12/30/17 0100 12/30/17 0131  BP: 109/60 90/62 (!) 89/59 (!) 90/52  Pulse: 78 74 72   Resp: 15 20 (!) 22   Temp:      TempSrc:      SpO2: 98% 99% 98%   Weight:       Eyes: PERRL, lids and conjunctivae normal ENMT: Mucous membranes are moist. Posterior pharynx clear of any exudate or lesions  Neck: normal, supple, no masses, no thyromegaly Respiratory: Decreased breath sounds noted on left lung field and mild crackles on the right lower lung field. Cardiovascular: Regular rate and rhythm, no murmurs / rubs / gallops. No extremity edema. 2+ pedal pulses. No carotid bruits.  Abdomen: no abdominal distention, no masses palpated. No hepatosplenomegaly. Bowel sounds positive.  Musculoskeletal: no clubbing / cyanosis. No joint deformity upper and lower extremities. Good ROM, no contractures. Normal muscle tone.  Skin: no rashes, lesions, ulcers.  No induration Neurologic: CN 2-12 grossly intact. Sensation intact, DTR normal. Strength 5/5 in all 4.  Psychiatric: Normal judgment and insight. Alert and oriented x 3. Normal mood.     Labs on Admission: I have personally reviewed following labs and imaging studies  CBC: Recent Labs  Lab 12/29/17 2246 12/29/17 2300  WBC 7.1  --   NEUTROABS 4.9  --   HGB 12.1* 13.3  HCT 35.3* 39.0  MCV 84.2  --   PLT 293  --    Basic Metabolic Panel: Recent Labs  Lab 12/29/17 2246 12/29/17 2300  NA 133* 140  K 4.1 3.8  CL 104 107  CO2 20*  --   GLUCOSE 101* 88  BUN 26* 11  CREATININE 1.21 0.70  CALCIUM 8.8*  --    GFR: CrCl cannot be calculated (Unknown ideal weight.). Liver Function Tests: No results for input(s): AST, ALT, ALKPHOS, BILITOT, PROT, ALBUMIN in the last 168 hours. No results for input(s): LIPASE, AMYLASE in the last 168 hours. No results for input(s): AMMONIA in the  last 168 hours. Coagulation Profile: No results for input(s): INR, PROTIME in the last 168 hours. Cardiac Enzymes: No results for input(s): CKTOTAL, CKMB, CKMBINDEX, TROPONINI in the last 168 hours. BNP (last 3 results) No results for input(s): PROBNP in the last 8760 hours. HbA1C: No results for input(s): HGBA1C in the last 72 hours. CBG: No results for input(s): GLUCAP in the last 168 hours. Lipid Profile: No results for input(s): CHOL, HDL, LDLCALC, TRIG, CHOLHDL, LDLDIRECT in the last 72 hours. Thyroid Function Tests: No results for input(s): TSH, T4TOTAL, FREET4, T3FREE, THYROIDAB in the last 72 hours. Anemia Panel: No results for input(s): VITAMINB12, FOLATE, FERRITIN, TIBC, IRON, RETICCTPCT in the last 72 hours. Urine analysis:    Component Value Date/Time   COLORURINE COLORLESS (A) 12/30/2017 0021   APPEARANCEUR CLEAR 12/30/2017 0021   LABSPEC 1.005 12/30/2017 0021   PHURINE 5.0 12/30/2017 0021   GLUCOSEU NEGATIVE 12/30/2017 0021   HGBUR NEGATIVE 12/30/2017 0021   BILIRUBINUR NEGATIVE 12/30/2017 0021   KETONESUR NEGATIVE 12/30/2017 0021   PROTEINUR NEGATIVE 12/30/2017 0021   UROBILINOGEN 0.2 07/04/2015 0600   NITRITE NEGATIVE 12/30/2017 0021   LEUKOCYTESUR NEGATIVE 12/30/2017 0021   Sepsis Labs: No results found for this or any previous visit (from the past 240 hour(s)).   Radiological Exams on Admission: Dg Chest 2 View  Result Date: 12/29/2017 CLINICAL DATA:  Difficulty breathing and swallowing. EXAM: CHEST  2 VIEW COMPARISON:  01/27/2017 FINDINGS: Postpneumonectomy change redemonstrated with resected posterior left sixth and chronic deformity of the left seventh ribs. Compensatory hyperinflation of the right lung with interval increase in pulmonary vascular congestion since prior. No significant pleural effusion. No pneumothorax. No pneumonic consolidation. No acute osseous abnormality. Heart is obscured by volume loss ectomy. IMPRESSION: Findings compatible with CHF  in the setting of prior left pneumonectomy. Electronically Signed   By: Ashley Royalty M.D.   On: 12/29/2017 22:59    EKG: Independently reviewed.  Sinus rhythm with nonspecific IVCD, but otherwise similar to previous tracings.  Assessment/Plan question Question CHF exacerbation: Acute.  Patient presents with complaints of aggressively worsening shortness of breath.  Utilizing Lasix as needed.  Last EF noted to be 45-50% with grade 2 diastolic dysfunction.  Initial orders were written for patient to be given 20 mg of Lasix IV, but were held due to soft blood pressures.  - Admit to a stepdown bed - Heart failure orders set  initiated  -  Continuous pulse oximetry with nasal cannula oxygen as needed to keep O2 saturations >92% - Strict I&Os and daily weights - Elevate lower extremities - Hold Coreg - Reassess in a.m. and adjust diuresis as needed. - Check echocardiogram - Optimize medical management when able - Dr. Terrence Dupont cardiology in a.m.  - Consulted [facility] M for assistance, will follow-up recommendations   Hypotension: Acute.  Systolic blood pressures noted to be in the 80s.  Review of previous hospitalizations show systolic blood pressures in the 90-100s. - Hold Coreg and spironolactone due to hypotension  Chest pain: Patient reports more so an epigastric discomfort with swallowing. - Trend cardiac troponins - follow-up echocardiogram  CAD  - Continue Effient  Chronic kidney disease stage II: stable   COPD: Question acute exacerbation - Continue Brovana and budesonide nebs - duonebs as needed shortness of breath/wheezing  H/O of Lung cancer status post left pneumonectomy   DVT prophylaxis: lovenox Code Status: full  Family Communication: No family present at bedside Disposition Plan: TBD Consults called: Cardiology Admission status: Inpatient  Norval Morton MD [facility] Pager [phone]   If 7PM-7AM, please contact night-coverage [email or web address] Password  [facility]  12/30/2017, 1:43 AM

## 2017-12-30 NOTE — Consult Note (Signed)
Name: Ronald [name] MRN: [id number] DOB: [birth year]    ADMISSION DATE:  12/29/2017 CONSULTATION DATE:  12/30/2017  REFERRING MD :  Dr. Tamala Julian  CHIEF COMPLAINT:  Hypotension   HISTORY OF PRESENT ILLNESS:   62 year old male with PMH of Systolic/Distolic HF (EF 67-61), HTN, CAD, lung CA s/p left pneumonectomy.   Presents to ED with reported 2 days of dyspnea and cough. Patient reports that his brother had recently visited and had cold like symptoms. Upon arrival pulse 95-09, systolic 32-671, Afebrile, Flu PCR negative. CXR concerning for pulmonary edema, BNP 475.5. LA 0.97, Given 400 ml fluid bolus. PCCM asked to consult regarding hypotension.   On assessment patient is alert and oriented, states that his baseline systolic is 24-58. States for the last few days when he eats he has substernal sharp chest pain and feels like food is getting stuck.   SIGNIFICANT EVENTS  2/14 > Presents to ED   STUDIES:  CXR 2/14 > Postpneumonectomy change redemonstrated with resected posterior left sixth and chronic deformity of the left seventh ribs. Compensatory hyperinflation of the right lung with interval increase in pulmonary vascular congestion since prior. No significant pleural effusion. No pneumothorax. No pneumonic consolidation. No acute osseous abnormality. Heart is obscured by volume loss ectomy. ECHO 2/15 >   PAST MEDICAL HISTORY :   has a past medical history of CHF (congestive heart failure) ([place]), COPD (chronic obstructive pulmonary disease) ([place]), Coronary artery disease, Hypertension, lung ca (dx'd 2005), Myocardial infarction ([place]), and Shortness of breath.  has a past surgical history that includes Coronary angioplasty with stent; Pneumonectomy (2005); left heart catheterization with coronary angiogram (N/A, 04/07/2012); Cardiac catheterization (N/A, 10/13/2015); and vocal cord surgery (2005). Prior to Admission medications   Medication Sig Start Date End Date Taking? Authorizing  Provider  albuterol (PROVENTIL HFA;VENTOLIN HFA) 108 (90 BASE) MCG/ACT inhaler Inhale 2 puffs into the lungs every 4 (four) hours as needed for wheezing or shortness of breath. 10/14/13  Yes Ward, Delice Bison, DO  aspirin 325 MG tablet Take 325 mg by mouth 2 (two) times a week.   Yes [provider]  budesonide-formoterol (SYMBICORT) 160-4.5 MCG/ACT inhaler Inhale 2 puffs into the lungs 2 (two) times daily.   Yes [provider]  carvedilol (COREG) 3.125 MG tablet Take 1 tablet (3.125 mg total) by mouth 2 (two) times daily with a meal. 07/06/15  Yes Tat, Shanon Brow, MD  furosemide (LASIX) 40 MG tablet Take 1 tablet (40 mg total) by mouth 2 (two) times daily. Twice a day for 3 days, then back to normal dosing. Patient taking differently: Take 40 mg by mouth daily as needed for fluid.  05/02/15  Yes Withrow, Elyse Jarvis, FNP  guaiFENesin (MUCINEX) 600 MG 12 hr tablet Take 2 tablets (1,200 mg total) by mouth 2 (two) times daily. Patient taking differently: Take 1,200 mg by mouth 2 (two) times daily as needed for cough or to loosen phlegm.  01/31/17  Yes Rai, Ripudeep K, MD  guaiFENesin-dextromethorphan (ROBITUSSIN DM) 100-10 MG/5ML syrup Take 5 mLs by mouth every 4 (four) hours as needed for cough. 01/31/17  Yes Rai, Ripudeep K, MD  montelukast (SINGULAIR) 10 MG tablet Take 1 tablet (10 mg total) by mouth at bedtime. 05/02/15  Yes Withrow, Elyse Jarvis, FNP  nitroGLYCERIN (NITROSTAT) 0.4 MG SL tablet Place 1 tablet (0.4 mg total) under the tongue every 5 (five) minutes x 3 doses as needed for chest pain. 11/01/13  Yes [name], MD  ondansetron [facility]  ODT) 4 MG disintegrating tablet Take 1 tablet (4 mg total) by mouth every 8 (eight) hours as needed for nausea or vomiting. 01/31/17  Yes Rai, Ripudeep K, MD  polyethylene glycol (MIRALAX) packet Take 17 g by mouth daily as needed for moderate constipation or severe constipation. 01/31/17  Yes Rai, Ripudeep K, MD  prasugrel (EFFIENT) 10 MG TABS tablet Take 1  tablet (10 mg total) by mouth daily. 07/06/15  Yes Tat, Shanon Brow, MD  spironolactone (ALDACTONE) 25 MG tablet Take 1 tablet (25 mg total) by mouth daily. 07/06/15  Yes Tat, Shanon Brow, MD  traMADol (ULTRAM) 50 MG tablet Take 1 tablet (50 mg total) by mouth every 12 (twelve) hours as needed for severe pain. 01/31/17  Yes Rai, Vernelle Emerald, MD   Allergies  Allergen Reactions  . Penicillins Itching    FAMILY HISTORY:  family history includes Diabetes in his other; Hypertension in his other. SOCIAL HISTORY:  reports that he quit smoking about 4 years ago. His smoking use included cigarettes. he has never used smokeless tobacco. He reports that he does not drink alcohol or use drugs.  REVIEW OF SYSTEMS:   All negative; except for those that are bolded, which indicate positives.  Constitutional: weight loss, weight gain, night sweats, fevers, chills, fatigue, weakness.  HEENT: headaches, sore throat, sneezing, nasal congestion, post nasal drip, difficulty swallowing, tooth/dental problems, visual complaints, visual changes, ear aches. Neuro: difficulty with speech, weakness, numbness, ataxia. CV:  chest pain, orthopnea, PND, swelling in lower extremities, dizziness, palpitations, syncope.  Resp: cough, hemoptysis, dyspnea, wheezing. GI: heartburn, indigestion, abdominal pain, nausea, vomiting, diarrhea, constipation, change in bowel habits, loss of appetite, hematemesis, melena, hematochezia.  GU: dysuria, change in color of urine, urgency or frequency, flank pain, hematuria. MSK: joint pain or swelling, decreased range of motion. Psych: change in mood or affect, depression, anxiety, suicidal ideations, homicidal ideations. Skin: rash, itching, bruising.  SUBJECTIVE:   VITAL SIGNS: Temp:  [98.6 F (37 C)] 98.6 F (37 C) (02/14 2228) Pulse Rate:  [68-93] 68 (02/15 0515) Resp:  [15-29] 21 (02/15 0515) BP: (82-135)/(46-73) 86/60 (02/15 0543) SpO2:  [95 %-100 %] 97 % (02/15 0515) Weight:  [63.5 kg  (140 lb)] 63.5 kg (140 lb) (02/14 2111)  PHYSICAL EXAMINATION: General:  Adult male, no distress Neuro:  Alert, oriented, follows commands  HEENT:  MMM Cardiovascular:  RRR, no MRG  Lungs:  Absent Breath sounds to left, diminished to right  Abdomen:  Non-tender, active bowel sounds Musculoskeletal:  -edema  Skin:  Warm, dry, intact   Recent Labs  Lab 12/29/17 2246 12/29/17 2300  NA 133* 140  K 4.1 3.8  CL 104 107  CO2 20*  --   BUN 26* 11  CREATININE 1.21 0.70  GLUCOSE 101* 88   Recent Labs  Lab 12/29/17 2246 12/29/17 2300  HGB 12.1* 13.3  HCT 35.3* 39.0  WBC 7.1  --   PLT 293  --    Dg Chest 2 View  Result Date: 12/29/2017 CLINICAL DATA:  Difficulty breathing and swallowing. EXAM: CHEST  2 VIEW COMPARISON:  01/27/2017 FINDINGS: Postpneumonectomy change redemonstrated with resected posterior left sixth and chronic deformity of the left seventh ribs. Compensatory hyperinflation of the right lung with interval increase in pulmonary vascular congestion since prior. No significant pleural effusion. No pneumothorax. No pneumonic consolidation. No acute osseous abnormality. Heart is obscured by volume loss ectomy. IMPRESSION: Findings compatible with CHF in the setting of prior left pneumonectomy. Electronically Signed   By: Shanon Brow  Randel Pigg M.D.   On: 12/29/2017 22:59    ASSESSMENT / PLAN:  Hypotension in setting of decreased systemic resistance Acute on Chronic Systolic/Diastolic Heart Failure  K1SW, EF 45-50  Plan  -Cardiology Consulted  -Maintain MAP >10, Systolic >93 (Currently BP 94/68)  -ECHO pending  -Hold Coreg, Lasix, Aldactone   Dyspnea in setting of CAP vs Viral Illness vs Decompensated Heart Failure  -Afebrile  -Flu PCR negative  -LA 0.97, WBC 7.1  Plan  -Flu PCR negative -RVP pending  -Trend WBC and Fever Curve -Trend PCT  -Azithromycin, Rocephin   COPD H/O Lung CA s/p left pneumonectomy  Plan  -Brovana, Scheduled Nebs, Pulmicort  -Maintain Oxygen  Saturation > 92   Acute Dysphagia  Plan  -GI Consult in AM    Patient is stable and is appropriate for step-down status.   Hayden Pedro, AGACNP-BC [facility]  Pgr: [phone]  PCCM Pgr: [phone]

## 2017-12-30 NOTE — Care Management Note (Signed)
Case Management Note  Patient Details  Name: Ronald [name] MRN: [id number] Date of Birth: [birth year]  Subjective/Objective:                  62 y.o. male with medical history significant of combined systolic and diastolic CHF with diastolic dysfunction, HTN, CAD, and history of lung cancer status post chemo/radiation and pneumonectomy; who presents with complaints of progressively worsening shortness of breath over the last 2 days.  From home.   Action/Plan: Admit status INPATIENT (CHF); anticipate discharge [place].   Expected Discharge Date:        TBD          Expected Discharge Plan:  [place]  I Discharge planning Services  CM Consult   Status of Service:  In process, will continue to follow  If discussed at Long Length of Stay Meetings, dates discussed:    Additional Comments: PCP: [name], MD  Patient coming from: home   Fuller Mandril, RN 12/30/2017, 12:11 PM

## 2017-12-31 ENCOUNTER — Inpatient Hospital Stay (HOSPITAL_COMMUNITY): Payer: Medicare Other

## 2017-12-31 DIAGNOSIS — I5033 Acute on chronic diastolic (congestive) heart failure: Secondary | ICD-10-CM

## 2017-12-31 LAB — RESPIRATORY PANEL BY PCR
Adenovirus: NOT DETECTED
Bordetella pertussis: NOT DETECTED
CORONAVIRUS OC43-RVPPCR: NOT DETECTED
Chlamydophila pneumoniae: NOT DETECTED
Coronavirus 229E: NOT DETECTED
Coronavirus HKU1: NOT DETECTED
Coronavirus NL63: NOT DETECTED
INFLUENZA A-RVPPCR: NOT DETECTED
INFLUENZA B-RVPPCR: NOT DETECTED
METAPNEUMOVIRUS-RVPPCR: NOT DETECTED
MYCOPLASMA PNEUMONIAE-RVPPCR: NOT DETECTED
PARAINFLUENZA VIRUS 1-RVPPCR: NOT DETECTED
PARAINFLUENZA VIRUS 4-RVPPCR: NOT DETECTED
Parainfluenza Virus 2: NOT DETECTED
Parainfluenza Virus 3: NOT DETECTED
RESPIRATORY SYNCYTIAL VIRUS-RVPPCR: NOT DETECTED
Rhinovirus / Enterovirus: NOT DETECTED

## 2017-12-31 LAB — BASIC METABOLIC PANEL
Anion gap: 10 (ref 5–15)
BUN: 25 mg/dL — ABNORMAL HIGH (ref 6–20)
CO2: 19 mmol/L — AB (ref 22–32)
Calcium: 8.9 mg/dL (ref 8.9–10.3)
Chloride: 105 mmol/L (ref 101–111)
Creatinine, Ser: 1.13 mg/dL (ref 0.61–1.24)
GLUCOSE: 117 mg/dL — AB (ref 65–99)
POTASSIUM: 5 mmol/L (ref 3.5–5.1)
Sodium: 134 mmol/L — ABNORMAL LOW (ref 135–145)

## 2017-12-31 LAB — LEGIONELLA PNEUMOPHILA SEROGP 1 UR AG: L. pneumophila Serogp 1 Ur Ag: NEGATIVE

## 2017-12-31 LAB — CBC
HEMATOCRIT: 35.5 % — AB (ref 39.0–52.0)
HEMOGLOBIN: 12.1 g/dL — AB (ref 13.0–17.0)
MCH: 28.5 pg (ref 26.0–34.0)
MCHC: 34.1 g/dL (ref 30.0–36.0)
MCV: 83.7 fL (ref 78.0–100.0)
Platelets: 323 10*3/uL (ref 150–400)
RBC: 4.24 MIL/uL (ref 4.22–5.81)
RDW: 15.6 % — ABNORMAL HIGH (ref 11.5–15.5)
WBC: 10.1 10*3/uL (ref 4.0–10.5)

## 2017-12-31 MED ORDER — IOPAMIDOL (ISOVUE-300) INJECTION 61%
INTRAVENOUS | Status: AC
  Filled 2017-12-31: qty 30

## 2017-12-31 MED ORDER — MORPHINE SULFATE (PF) 2 MG/ML IV SOLN
1.0000 mg | INTRAVENOUS | Status: DC | PRN
  Administered 2017-12-31 – 2018-01-01 (×2): 1 mg via INTRAVENOUS
  Filled 2017-12-31 (×3): qty 1

## 2017-12-31 MED ORDER — IOPAMIDOL (ISOVUE-300) INJECTION 61%
INTRAVENOUS | Status: AC
  Administered 2017-12-31: 100 mL via INTRAVENOUS
  Filled 2017-12-31: qty 100

## 2017-12-31 NOTE — Progress Notes (Signed)
Gave patient the enema and took less than 2 minutes for patient to start having a BM, placed patient on bedside commode.  Will keep monitoring patient.

## 2017-12-31 NOTE — Progress Notes (Signed)
Patient's still complaining of pain in his stomach despite given him the enema.  Pain level increasing with severe discomfort.  Gave patient Toradol and Mylicon to see if pain and discomfort decreases.  Will keep monitoring patient.

## 2017-12-31 NOTE — Progress Notes (Signed)
PROGRESS NOTE    Ronald [name]  CHY:[id number] DOB: [birth year] DOA: 12/29/2017 PCP: [name], MD     Brief Narrative:  Ronald [name] a 62 y.o.malewith medical history significant ofcombined systolic and diastolic CHF last EF 87-86% with grade 2 diastolic dysfunction, HTN, CAD s/p PCI, and history of lung cancer status post chemo/radiation and pneumonectomy; who presents with complaints of progressively worsening shortness of breath over the last 2 days.He complains of a cough, but reports that it is hard for him to get anything up. He reports utilizing Lasix on an as-needed basis.He also complains of substernal sharp chest pain that is most notable with eating solid foods.In the ED, his blood pressure was low with CXR concerning for CHF. PCCM was consulted due to hypotension. Cardiology consulted for CHF.   Assessment & Plan:   Active Problems:   Lung cancer (HCC)   COPD (chronic obstructive pulmonary disease) (HCC)   Acute on chronic congestive heart failure (HCC)   Hypotension  Acute decompensated diastolic HF secondary to valvular heart disease Hx CAD s/p PCI  -Cardiology -Troponin x 3 negative -BNP 475.8 -Echo EF 50%, mild LVH, moderate mitral regurg  -Aspirin/Effient, altace  -Note plan for cath/PCI  Hypotension -Hold coreg, lasix, spironolactone  -Improved and BP stable   Abdominal pain -AXR this morning negative -Stools with enema last night but continues to have generalized abdominal pain -Check CT abd/pelvis  Odynophagia -Last 3 days prior to admission. No odynophagia this admission, ate yesterday without issue. He was seen by [facility]/Dr. Oletta Lamas in 2017 for colonoscopy. Would pursue work up as outpatient   COPD -Without exacerbation. Continue nebs   ?URI -Stop antibiotics. He has no focal infiltrate, procalcitonin negative, he is afebrile with normal WBC. Resp PCR and strep pneumo Ag negative   DVT prophylaxis: lovenox Code Status:  full Family Communication: no family at bedside Disposition Plan: pending improvement   Consultants:   Cardiology  Procedures:   None   Antimicrobials:  Anti-infectives (From admission, onward)   Start     Dose/Rate Route Frequency Ordered Stop   12/30/17 0630  azithromycin (ZITHROMAX) 500 mg in sodium chloride 0.9 % 250 mL IVPB  Status:  Discontinued     500 mg 250 mL/hr over 60 Minutes Intravenous Daily 12/30/17 0610 12/30/17 1355   12/30/17 0630  cefTRIAXone (ROCEPHIN) 1 g in sodium chloride 0.9 % 100 mL IVPB  Status:  Discontinued     1 g 200 mL/hr over 30 Minutes Intravenous Daily 12/30/17 0615 12/30/17 1355   12/30/17 0015  cefTRIAXone (ROCEPHIN) 1 g in sodium chloride 0.9 % 100 mL IVPB  Status:  Discontinued     1 g 200 mL/hr over 30 Minutes Intravenous  Once 12/30/17 0006 12/30/17 0053   12/30/17 0015  azithromycin (ZITHROMAX) tablet 500 mg  Status:  Discontinued     500 mg Oral  Once 12/30/17 0006 12/30/17 0053       Subjective: Complains of diffuse stabbing abdominal pain with nausea, vomiting  Objective: Vitals:   12/30/17 1500 12/30/17 1912 12/31/17 0425 12/31/17 0952  BP: 103/73 97/70 103/64 112/70  Pulse: 87 75 68   Resp: (!) 25 20 18    Temp: 97.8 F (36.6 C) 98.1 F (36.7 C) (!) 97.5 F (36.4 C)   TempSrc: Oral Oral Oral   SpO2: 94% 99% 97%   Weight: 66.8 kg (147 lb 3.2 oz)  66.8 kg (147 lb 3.2 oz)   Height: 5' (1.524 m)  Intake/Output Summary (Last 24 hours) at 12/31/2017 1337 Last data filed at 12/31/2017 0037 Gross per 24 hour  Intake 776 ml  Output 100 ml  Net 676 ml   Filed Weights   12/29/17 2111 12/30/17 1500 12/31/17 0425  Weight: 63.5 kg (140 lb) 66.8 kg (147 lb 3.2 oz) 66.8 kg (147 lb 3.2 oz)    Examination:  General exam: Appears calm and uncomfortable  Respiratory system: Clear to auscultation. Respiratory effort normal. Cardiovascular system: S1 & S2 heard, RRR. +murmur  Gastrointestinal system: Abdomen is nondistended,  soft and TTP diffusely. No organomegaly or masses felt. Normal bowel sounds heard. Central nervous system: Alert and oriented. No focal neurological deficits. Extremities: Symmetric 5 x 5 power. Skin: No rashes, lesions or ulcers Psychiatry: Judgement and insight appear normal. Mood & affect appropriate.   Data Reviewed: I have personally reviewed following labs and imaging studies  CBC: Recent Labs  Lab 12/29/17 2246 12/29/17 2300 12/30/17 0548 12/31/17 0428  WBC 7.1  --  7.0 10.1  NEUTROABS 4.9  --   --   --   HGB 12.1* 13.3 11.6* 12.1*  HCT 35.3* 39.0 34.1* 35.5*  MCV 84.2  --  84.2 83.7  PLT 293  --  315 409   Basic Metabolic Panel: Recent Labs  Lab 12/29/17 2246 12/29/17 2300 12/30/17 0548 12/31/17 0428  NA 133* 140  --  134*  K 4.1 3.8  --  5.0  CL 104 107  --  105  CO2 20*  --   --  19*  GLUCOSE 101* 88  --  117*  BUN 26* 11  --  25*  CREATININE 1.21 0.70  --  1.13  CALCIUM 8.8*  --   --  8.9  MG  --   --  2.1  --   PHOS  --   --  3.6  --    GFR: Estimated Creatinine Clearance: 55.8 mL/min (by C-G formula based on SCr of 1.13 mg/dL). Liver Function Tests: No results for input(s): AST, ALT, ALKPHOS, BILITOT, PROT, ALBUMIN in the last 168 hours. No results for input(s): LIPASE, AMYLASE in the last 168 hours. No results for input(s): AMMONIA in the last 168 hours. Coagulation Profile: No results for input(s): INR, PROTIME in the last 168 hours. Cardiac Enzymes: Recent Labs  Lab 12/30/17 0308 12/30/17 0952  TROPONINI <0.03 <0.03   BNP (last 3 results) No results for input(s): PROBNP in the last 8760 hours. HbA1C: No results for input(s): HGBA1C in the last 72 hours. CBG: No results for input(s): GLUCAP in the last 168 hours. Lipid Profile: No results for input(s): CHOL, HDL, LDLCALC, TRIG, CHOLHDL, LDLDIRECT in the last 72 hours. Thyroid Function Tests: No results for input(s): TSH, T4TOTAL, FREET4, T3FREE, THYROIDAB in the last 72 hours. Anemia  Panel: No results for input(s): VITAMINB12, FOLATE, FERRITIN, TIBC, IRON, RETICCTPCT in the last 72 hours. Sepsis Labs: Recent Labs  Lab 12/30/17 0114 12/30/17 0548  PROCALCITON  --  <0.10  LATICACIDVEN 0.97  --     Recent Results (from the past 240 hour(s))  Respiratory Panel by PCR     Status: None   Collection Time: 12/30/17  3:42 PM  Result Value Ref Range Status   Adenovirus NOT DETECTED NOT DETECTED Final   Coronavirus 229E NOT DETECTED NOT DETECTED Final   Coronavirus HKU1 NOT DETECTED NOT DETECTED Final   Coronavirus NL63 NOT DETECTED NOT DETECTED Final   Coronavirus OC43 NOT DETECTED NOT DETECTED Final  Metapneumovirus NOT DETECTED NOT DETECTED Final   Rhinovirus / Enterovirus NOT DETECTED NOT DETECTED Final   Influenza A NOT DETECTED NOT DETECTED Final   Influenza B NOT DETECTED NOT DETECTED Final   Parainfluenza Virus 1 NOT DETECTED NOT DETECTED Final   Parainfluenza Virus 2 NOT DETECTED NOT DETECTED Final   Parainfluenza Virus 3 NOT DETECTED NOT DETECTED Final   Parainfluenza Virus 4 NOT DETECTED NOT DETECTED Final   Respiratory Syncytial Virus NOT DETECTED NOT DETECTED Final   Bordetella pertussis NOT DETECTED NOT DETECTED Final   Chlamydophila pneumoniae NOT DETECTED NOT DETECTED Final   Mycoplasma pneumoniae NOT DETECTED NOT DETECTED Final    Comment: Performed at [facility], [place] [address] [place], [place] [address]  MRSA PCR Screening     Status: None   Collection Time: 12/30/17  3:42 PM  Result Value Ref Range Status   MRSA by PCR NEGATIVE NEGATIVE Final    Comment:        The GeneXpert MRSA Assay (FDA approved for NASAL specimens only), is one component of a comprehensive MRSA colonization surveillance program. It is not intended to diagnose MRSA infection nor to guide or monitor treatment for MRSA infections. Performed at [facility], [place] [address], [place], [place] [address]        Radiology Studies: Dg Chest 2  View  Result Date: 12/29/2017 CLINICAL DATA:  Difficulty breathing and swallowing. EXAM: CHEST  2 VIEW COMPARISON:  01/27/2017 FINDINGS: Postpneumonectomy change redemonstrated with resected posterior left sixth and chronic deformity of the left seventh ribs. Compensatory hyperinflation of the right lung with interval increase in pulmonary vascular congestion since prior. No significant pleural effusion. No pneumothorax. No pneumonic consolidation. No acute osseous abnormality. Heart is obscured by volume loss ectomy. IMPRESSION: Findings compatible with CHF in the setting of prior left pneumonectomy. Electronically Signed   By: Ashley Royalty M.D.   On: 12/29/2017 22:59   Dg Abd 1 View  Result Date: 12/31/2017 CLINICAL DATA:  Abdominal pain. EXAM: ABDOMEN - 1 VIEW COMPARISON:  Abdominal x-ray dated January 31, 2017. FINDINGS: Nonobstructive bowel gas pattern. No radiopaque calculi or other abnormal calcification. No acute osseous abnormality. IMPRESSION: Negative. Electronically Signed   By: Titus Dubin M.D.   On: 12/31/2017 09:08      Scheduled Meds: . arformoterol  15 mcg Nebulization BID  . aspirin EC  81 mg Oral Daily  . budesonide (PULMICORT) nebulizer solution  0.5 mg Nebulization BID  . enoxaparin (LOVENOX) injection  40 mg Subcutaneous Q24H  . iopamidol      . montelukast  10 mg Oral QHS  . nitroGLYCERIN  0.5 inch Topical Q8H  . polyethylene glycol  17 g Oral Daily  . prasugrel  10 mg Oral Daily  . ramipril  1.25 mg Oral Daily  . sodium chloride flush  3 mL Intravenous Q12H  . zolpidem  5 mg Oral Once   Continuous Infusions: . sodium chloride       LOS: 1 day    Time spent: 30 minutes   Dessa Phi, DO [facility] [email or web address] Password [facility] 12/31/2017, 1:37 PM

## 2017-12-31 NOTE — Progress Notes (Signed)
Subjective:  Patient gives history of exertional anginal chest pain with minimal exertion states gets chest pain pressure while climbing the stairs. States breathing has improved since admission. Complains of also vague abdominal pain and constipation.  Objective:  Vital Signs in the last 24 hours: Temp:  [97.5 F (36.4 C)-98.1 F (36.7 C)] 97.5 F (36.4 C) (02/16 0425) Pulse Rate:  [68-87] 68 (02/16 0425) Resp:  [18-32] 18 (02/16 0425) BP: (89-112)/(58-84) 112/70 (02/16 0952) SpO2:  [94 %-99 %] 97 % (02/16 0425) Weight:  [66.8 kg (147 lb 3.2 oz)] 66.8 kg (147 lb 3.2 oz) (02/16 0425)  Intake/Output from previous day: 02/15 0701 - 02/16 0700 In: 2376 [P.O.:773; I.V.:3; IV Piggyback:350] Out: 100 [Urine:100] Intake/Output from this shift: No intake/output data recorded.  Physical Exam: Neck: no adenopathy, no carotid bruit, no JVD and supple, symmetrical, trachea midline Lungs: Faint rales with occasional wheezing noted right lung field Heart: regular rate and rhythm, S1, S2 normal and 3/6 systolic murmur noted Abdomen: soft, non-tender; bowel sounds normal; no masses,  no organomegaly Extremities: extremities normal, atraumatic, no cyanosis or edema  Lab Results: Recent Labs    12/30/17 0548 12/31/17 0428  WBC 7.0 10.1  HGB 11.6* 12.1*  PLT 315 323   Recent Labs    12/29/17 2246 12/29/17 2300 12/31/17 0428  NA 133* 140 134*  K 4.1 3.8 5.0  CL 104 107 105  CO2 20*  --  19*  GLUCOSE 101* 88 117*  BUN 26* 11 25*  CREATININE 1.21 0.70 1.13   Recent Labs    12/30/17 0308 12/30/17 0952  TROPONINI <0.03 <0.03   Hepatic Function Panel No results for input(s): PROT, ALBUMIN, AST, ALT, ALKPHOS, BILITOT, BILIDIR, IBILI in the last 72 hours. No results for input(s): CHOL in the last 72 hours. No results for input(s): PROTIME in the last 72 hours.  Imaging: Imaging results have been reviewed and Dg Chest 2 View  Result Date: 12/29/2017 CLINICAL DATA:  Difficulty  breathing and swallowing. EXAM: CHEST  2 VIEW COMPARISON:  01/27/2017 FINDINGS: Postpneumonectomy change redemonstrated with resected posterior left sixth and chronic deformity of the left seventh ribs. Compensatory hyperinflation of the right lung with interval increase in pulmonary vascular congestion since prior. No significant pleural effusion. No pneumothorax. No pneumonic consolidation. No acute osseous abnormality. Heart is obscured by volume loss ectomy. IMPRESSION: Findings compatible with CHF in the setting of prior left pneumonectomy. Electronically Signed   By: Ashley Royalty M.D.   On: 12/29/2017 22:59   Dg Abd 1 View  Result Date: 12/31/2017 CLINICAL DATA:  Abdominal pain. EXAM: ABDOMEN - 1 VIEW COMPARISON:  Abdominal x-ray dated January 31, 2017. FINDINGS: Nonobstructive bowel gas pattern. No radiopaque calculi or other abnormal calcification. No acute osseous abnormality. IMPRESSION: Negative. Electronically Signed   By: Titus Dubin M.D.   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 Discussed with patient various options of treatment i.e. noninvasive nuclear stress testing versus left cardiac catheterization possible PTCA  stenting its risk and benefits and consents for PCI.   LOS: 1 day    [name] 12/31/2017, 12:08 PM

## 2018-01-01 LAB — BASIC METABOLIC PANEL
ANION GAP: 9 (ref 5–15)
BUN: 17 mg/dL (ref 6–20)
CALCIUM: 8.5 mg/dL — AB (ref 8.9–10.3)
CO2: 20 mmol/L — ABNORMAL LOW (ref 22–32)
CREATININE: 1.06 mg/dL (ref 0.61–1.24)
Chloride: 104 mmol/L (ref 101–111)
GFR calc non Af Amer: >60 mL/min (ref >60)
Glucose, Bld: 104 mg/dL — ABNORMAL HIGH (ref 65–99)
Potassium: 4.6 mmol/L (ref 3.5–5.1)
SODIUM: 133 mmol/L — AB (ref 135–145)

## 2018-01-01 MED ORDER — PIPERACILLIN-TAZOBACTAM 3.375 G IVPB
3.3750 g | Freq: Three times a day (TID) | INTRAVENOUS | Status: DC
  Administered 2018-01-01 – 2018-01-04 (×10): 3.375 g via INTRAVENOUS
  Filled 2018-01-01 (×11): qty 50

## 2018-01-01 NOTE — Progress Notes (Signed)
Subjective:  Patient presently denies any chest pain states breathing has improved. Continues to have a generalized abdominal pain. CT results noted  Objective:  Vital Signs in the last 24 hours: Temp:  [98.1 F (36.7 C)-98.6 F (37 C)] 98.3 F (36.8 C) (02/17 0810) Pulse Rate:  [79-101] 86 (02/17 0810) Resp:  [18-23] 18 (02/17 0810) BP: (94-113)/(62-81) 94/72 (02/17 0810) SpO2:  [93 %-100 %] 100 % (02/17 0859) Weight:  [66.9 kg (147 lb 6.4 oz)] 66.9 kg (147 lb 6.4 oz) (02/17 0254)  Intake/Output from previous day: 02/16 0701 - 02/17 0700 In: 840 [P.O.:840] Out: 650 [Urine:650] Intake/Output from this shift: No intake/output data recorded.  Physical Exam: Neck: no adenopathy, no carotid bruit, no JVD and supple, symmetrical, trachea midline Lungs: Right lung field decrease breath sound at bases air entry has improved Heart: regular rate and rhythm, S1, S2 normal and 3/6 systolic murmur noted Abdomen: Soft bowel sounds present mild generalized tenderness no guarding Extremities: extremities normal, atraumatic, no cyanosis or edema  Lab Results: Recent Labs    12/30/17 0548 12/31/17 0428  WBC 7.0 10.1  HGB 11.6* 12.1*  PLT 315 323   Recent Labs    12/31/17 0428 01/01/18 0444  NA 134* 133*  K 5.0 4.6  CL 105 104  CO2 19* 20*  GLUCOSE 117* 104*  BUN 25* 17  CREATININE 1.13 1.06   Recent Labs    12/30/17 0308 12/30/17 0952  TROPONINI <0.03 <0.03   Hepatic Function Panel No results for input(s): PROT, ALBUMIN, AST, ALT, ALKPHOS, BILITOT, BILIDIR, IBILI in the last 72 hours. No results for input(s): CHOL in the last 72 hours. No results for input(s): PROTIME in the last 72 hours.  Imaging: Imaging results have been reviewed and Dg Abd 1 View  Result Date: 12/31/2017 CLINICAL DATA:  Abdominal pain. EXAM: ABDOMEN - 1 VIEW COMPARISON:  Abdominal x-ray dated January 31, 2017. FINDINGS: Nonobstructive bowel gas pattern. No radiopaque calculi or other abnormal  calcification. No acute osseous abnormality. IMPRESSION: Negative. Electronically Signed   By: Titus Dubin M.D.   On: 12/31/2017 09:08   Ct Abdomen Pelvis W Contrast  Result Date: 12/31/2017 CLINICAL DATA:  62 y/o M; history of lung cancer post chemo and radiation as well as pneumonectomy. Patient presents with worsening shortness of breath and generalized abdominal pain. EXAM: CT ABDOMEN AND PELVIS WITH CONTRAST TECHNIQUE: Multidetector CT imaging of the abdomen and pelvis was performed using the standard protocol following bolus administration of intravenous contrast. CONTRAST:  181mL ISOVUE-300 IOPAMIDOL (ISOVUE-300) INJECTION 61% COMPARISON:  03/18/2011 CT abdomen and pelvis. FINDINGS: Lower chest: Left pneumonectomy postsurgical changes. Hepatobiliary: No focal liver abnormality is seen. No gallstones, gallbladder wall thickening, or biliary dilatation. Pancreas: Unremarkable. No pancreatic ductal dilatation or surrounding inflammatory changes. Spleen: Normal in size without focal abnormality. Adrenals/Urinary Tract: Adrenal glands are unremarkable. Kidneys are normal, without renal calculi, focal lesion, or hydronephrosis. Bladder is unremarkable. Stomach/Bowel: Normal appearance of stomach and small bowel. Long segment of severe wall thickening with heaped up margins and nodularity of the mid transverse colon and separate segment of moderate wall thickening of the cecum. No evidence for perforation or abscess. No findings of obstruction. Normal appendix. Vascular/Lymphatic: Aortic atherosclerosis. No enlarged abdominal or pelvic lymph nodes. Reproductive: Prostatic calcifications. Other: Right anterior lower abdominal wall injection sites. Musculoskeletal: No fracture is seen. IMPRESSION: Long segment of severe wall thickening of mid transverse colon and moderate wall thickening in the cecum. Findings probably represent infectious or inflammatory colitis (  if inflammatory Crohn's is favored given  absence of rectosigmoid disease and discontinuity). Additionally, transverse segment wall thickening demonstrates heaped up margins and nodularity, underlying adenocarcinoma is not excluded. No obstruction, perforation, abscess. Electronically Signed   By: Kristine Garbe M.D.   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

## 2018-01-01 NOTE — Progress Notes (Signed)
Pharmacy Antibiotic Note  Ronald [name] is a 62 y.o. male admitted on 12/29/2017 with Intra-abdominal infection.  Pharmacy has been consulted for zosyn dosing.  Generalized abdominal pain, CT findings consistent with colitis, no obstruction, perforation or abscess.  WBC wnl, afeb.  SCr 1.06 stable. Pt described minor itching with penicillin in childhood but states he can take penicillins now.    Plan: Zosyn 3.375mg  IV q 8h F/u clinical progression, LOT, renal function  Height: 5' (152.4 cm) Weight: 147 lb 6.4 oz (66.9 kg) IBW/kg (Calculated) : 50  Temp (24hrs), Avg:98.5 F (36.9 C), Min:98.1 F (36.7 C), Max:98.6 F (37 C)  Recent Labs  Lab 12/29/17 2246 12/29/17 2300 12/30/17 0114 12/30/17 0548 12/31/17 0428 01/01/18 0444  WBC 7.1  --   --  7.0 10.1  --   CREATININE 1.21 0.70  --   --  1.13 1.06  LATICACIDVEN  --   --  0.97  --   --   --     Estimated Creatinine Clearance: 59.5 mL/min (by C-G formula based on SCr of 1.06 mg/dL).    Allergies  Allergen Reactions  . Penicillins Itching    Antimicrobials this admission: Zosyn 2/17>  Dose adjustments this admission: n/a  Microbiology results: BCx 2/15: ngdt MRSA + resp PCR negative  Bertis Ruddy, PharmD Pharmacy Resident Pager #: [phone] 01/01/2018 8:14 AM

## 2018-01-01 NOTE — Progress Notes (Signed)
PROGRESS NOTE    Ronald [name]  UUV:[id number] DOB: 08/20/1956 DOA: 12/29/2017 PCP: [name], MD     Brief Narrative:  Ronald [name] a 62 y.o.malewith medical history significant ofcombined systolic and diastolic CHF last EF 47-42% with grade 2 diastolic dysfunction, HTN, CAD s/p PCI, and history of lung cancer status post chemo/radiation and pneumonectomy; who presents with complaints of progressively worsening shortness of breath over the last 2 days.He complains of a cough, but reports that it is hard for him to get anything up. He reports utilizing Lasix on an as-needed basis.He also complains of substernal sharp chest pain that is most notable with eating solid foods.In the ED, his blood pressure was low with CXR concerning for CHF. PCCM was consulted due to hypotension. Cardiology consulted for CHF.   Assessment & Plan:   Active Problems:   Lung cancer (HCC)   COPD (chronic obstructive pulmonary disease) (HCC)   Acute on chronic congestive heart failure (HCC)   Hypotension  Acute decompensated diastolic HF secondary to valvular heart disease Hx CAD s/p PCI  -Cardiology following  -Troponin x 3 negative -BNP 475.8 -Echo EF 50%, mild LVH, moderate mitral regurg  -Aspirin/Effient, altace  -Note plan for cath/PCI once colitis improves   Hypotension -Hold coreg, lasix, spironolactone  -BP stable this morning   Acute colitis  -AXR negative -CT abd/pelvis with severe wall thickening mid transverse colon, moderate wall thickening cecum. Likely infectious vs inflammatory colitis. Underlying adenocarcinoma not excluded -Start IV zosyn, morphine for pain control, full liquid diet. Hold off IVF in setting of CHF  Odynophagia -Last 3 days prior to admission. No odynophagia this admission, diet without issue. He was seen by [facility]/Dr. Oletta Lamas in 2017 for colonoscopy. Would pursue work up as outpatient   COPD -Without exacerbation. Continue nebs   Concern  for URI at time of admission  -Stop antibiotics. He has no focal infiltrate, procalcitonin negative, he is afebrile with normal WBC. Resp PCR and strep pneumo Ag negative   DVT prophylaxis: lovenox Code Status: full Family Communication: family sleeping at bedside Disposition Plan: pending improvement in colitis, PCI planned next week    Consultants:   Cardiology  Procedures:   None   Antimicrobials:  Anti-infectives (From admission, onward)   Start     Dose/Rate Route Frequency Ordered Stop   01/01/18 0815  piperacillin-tazobactam (ZOSYN) IVPB 3.375 g     3.375 g 12.5 mL/hr over 240 Minutes Intravenous Every 8 hours 01/01/18 0809     12/30/17 0630  azithromycin (ZITHROMAX) 500 mg in sodium chloride 0.9 % 250 mL IVPB  Status:  Discontinued     500 mg 250 mL/hr over 60 Minutes Intravenous Daily 12/30/17 0610 12/30/17 1355   12/30/17 0630  cefTRIAXone (ROCEPHIN) 1 g in sodium chloride 0.9 % 100 mL IVPB  Status:  Discontinued     1 g 200 mL/hr over 30 Minutes Intravenous Daily 12/30/17 0615 12/30/17 1355   12/30/17 0015  cefTRIAXone (ROCEPHIN) 1 g in sodium chloride 0.9 % 100 mL IVPB  Status:  Discontinued     1 g 200 mL/hr over 30 Minutes Intravenous  Once 12/30/17 0006 12/30/17 0053   12/30/17 0015  azithromycin (ZITHROMAX) tablet 500 mg  Status:  Discontinued     500 mg Oral  Once 12/30/17 0006 12/30/17 0053       Subjective: Low appetite, continues to have abdominal pain. Small BM yesterday    Objective: Vitals:   01/01/18 0354 01/01/18 0810 01/01/18  0856 01/01/18 0859  BP: 101/69 94/72    Pulse: (!) 101 86    Resp:  18    Temp: 98.5 F (36.9 C) 98.3 F (36.8 C)    TempSrc: Oral Oral    SpO2: 93% 96% 97% 100%  Weight:      Height:        Intake/Output Summary (Last 24 hours) at 01/01/2018 1000 Last data filed at 01/01/2018 0257 Gross per 24 hour  Intake 600 ml  Output 650 ml  Net -50 ml   Filed Weights   12/30/17 1500 12/31/17 0425 01/01/18 0254    Weight: 66.8 kg (147 lb 3.2 oz) 66.8 kg (147 lb 3.2 oz) 66.9 kg (147 lb 6.4 oz)    Examination: General exam: Appears calm and comfortable  Respiratory system: Clear to auscultation. Respiratory effort normal. Cardiovascular system: S1 & S2 heard, RRR. +murmur  Gastrointestinal system: Abdomen is nondistended, soft and TTP diffusely, worse in LLQ. No organomegaly or masses felt. Normal bowel sounds heard. Central nervous system: Alert and oriented. No focal neurological deficits. Extremities: Symmetric 5 x 5 power. Skin: No rashes, lesions or ulcers Psychiatry: Judgement and insight appear normal. Mood & affect appropriate.    Data Reviewed: I have personally reviewed following labs and imaging studies  CBC: Recent Labs  Lab 12/29/17 2246 12/29/17 2300 12/30/17 0548 12/31/17 0428  WBC 7.1  --  7.0 10.1  NEUTROABS 4.9  --   --   --   HGB 12.1* 13.3 11.6* 12.1*  HCT 35.3* 39.0 34.1* 35.5*  MCV 84.2  --  84.2 83.7  PLT 293  --  315 144   Basic Metabolic Panel: Recent Labs  Lab 12/29/17 2246 12/29/17 2300 12/30/17 0548 12/31/17 0428 01/01/18 0444  NA 133* 140  --  134* 133*  K 4.1 3.8  --  5.0 4.6  CL 104 107  --  105 104  CO2 20*  --   --  19* 20*  GLUCOSE 101* 88  --  117* 104*  BUN 26* 11  --  25* 17  CREATININE 1.21 0.70  --  1.13 1.06  CALCIUM 8.8*  --   --  8.9 8.5*  MG  --   --  2.1  --   --   PHOS  --   --  3.6  --   --    GFR: Estimated Creatinine Clearance: 59.5 mL/min (by C-G formula based on SCr of 1.06 mg/dL). Liver Function Tests: No results for input(s): AST, ALT, ALKPHOS, BILITOT, PROT, ALBUMIN in the last 168 hours. No results for input(s): LIPASE, AMYLASE in the last 168 hours. No results for input(s): AMMONIA in the last 168 hours. Coagulation Profile: No results for input(s): INR, PROTIME in the last 168 hours. Cardiac Enzymes: Recent Labs  Lab 12/30/17 0308 12/30/17 0952  TROPONINI <0.03 <0.03   BNP (last 3 results) No results for  input(s): PROBNP in the last 8760 hours. HbA1C: No results for input(s): HGBA1C in the last 72 hours. CBG: No results for input(s): GLUCAP in the last 168 hours. Lipid Profile: No results for input(s): CHOL, HDL, LDLCALC, TRIG, CHOLHDL, LDLDIRECT in the last 72 hours. Thyroid Function Tests: No results for input(s): TSH, T4TOTAL, FREET4, T3FREE, THYROIDAB in the last 72 hours. Anemia Panel: No results for input(s): VITAMINB12, FOLATE, FERRITIN, TIBC, IRON, RETICCTPCT in the last 72 hours. Sepsis Labs: Recent Labs  Lab 12/30/17 0114 12/30/17 0548  PROCALCITON  --  <0.10  LATICACIDVEN 0.97  --  Recent Results (from the past 240 hour(s))  Blood Culture (routine x 2)     Status: None (Preliminary result)   Collection Time: 12/30/17  6:12 AM  Result Value Ref Range Status   Specimen Description BLOOD RIGHT ANTECUBITAL  Final   Special Requests   Final    IN BOTH AEROBIC AND ANAEROBIC BOTTLES Blood Culture adequate volume   Culture   Final    NO GROWTH 1 DAY Performed at [facility], [address], [place], [place] [address]    Report Status PENDING  Incomplete  Blood Culture (routine x 2)     Status: None (Preliminary result)   Collection Time: 12/30/17  6:17 AM  Result Value Ref Range Status   Specimen Description BLOOD LEFT ANTECUBITAL  Final   Special Requests IN PEDIATRIC BOTTLE Blood Culture adequate volume  Final   Culture   Final    NO GROWTH 1 DAY Performed at [facility], [place] [address] [place], [place] [address]    Report Status PENDING  Incomplete  Respiratory Panel by PCR     Status: None   Collection Time: 12/30/17  3:42 PM  Result Value Ref Range Status   Adenovirus NOT DETECTED NOT DETECTED Final   Coronavirus 229E NOT DETECTED NOT DETECTED Final   Coronavirus HKU1 NOT DETECTED NOT DETECTED Final   Coronavirus NL63 NOT DETECTED NOT DETECTED Final   Coronavirus OC43 NOT DETECTED NOT DETECTED Final   Metapneumovirus NOT DETECTED NOT  DETECTED Final   Rhinovirus / Enterovirus NOT DETECTED NOT DETECTED Final   Influenza A NOT DETECTED NOT DETECTED Final   Influenza B NOT DETECTED NOT DETECTED Final   Parainfluenza Virus 1 NOT DETECTED NOT DETECTED Final   Parainfluenza Virus 2 NOT DETECTED NOT DETECTED Final   Parainfluenza Virus 3 NOT DETECTED NOT DETECTED Final   Parainfluenza Virus 4 NOT DETECTED NOT DETECTED Final   Respiratory Syncytial Virus NOT DETECTED NOT DETECTED Final   Bordetella pertussis NOT DETECTED NOT DETECTED Final   Chlamydophila pneumoniae NOT DETECTED NOT DETECTED Final   Mycoplasma pneumoniae NOT DETECTED NOT DETECTED Final    Comment: Performed at [facility], [place] [address], [place], [place] [address]  MRSA PCR Screening     Status: None   Collection Time: 12/30/17  3:42 PM  Result Value Ref Range Status   MRSA by PCR NEGATIVE NEGATIVE Final    Comment:        The GeneXpert MRSA Assay (FDA approved for NASAL specimens only), is one component of a comprehensive MRSA colonization surveillance program. It is not intended to diagnose MRSA infection nor to guide or monitor treatment for MRSA infections. Performed at [facility], [place] [address], [place], [place] [address]        Radiology Studies: Dg Abd 1 View  Result Date: 12/31/2017 CLINICAL DATA:  Abdominal pain. EXAM: ABDOMEN - 1 VIEW COMPARISON:  Abdominal x-ray dated January 31, 2017. FINDINGS: Nonobstructive bowel gas pattern. No radiopaque calculi or other abnormal calcification. No acute osseous abnormality. IMPRESSION: Negative. Electronically Signed   By: Titus Dubin M.D.   On: 12/31/2017 09:08   Ct Abdomen Pelvis W Contrast  Result Date: 12/31/2017 CLINICAL DATA:  62 y/o M; history of lung cancer post chemo and radiation as well as pneumonectomy. Patient presents with worsening shortness of breath and generalized abdominal pain. EXAM: CT ABDOMEN AND PELVIS WITH CONTRAST TECHNIQUE: Multidetector CT imaging of the  abdomen and pelvis was  performed using the standard protocol following bolus administration of intravenous contrast. CONTRAST:  187mL ISOVUE-300 IOPAMIDOL (ISOVUE-300) INJECTION 61% COMPARISON:  03/18/2011 CT abdomen and pelvis. FINDINGS: Lower chest: Left pneumonectomy postsurgical changes. Hepatobiliary: No focal liver abnormality is seen. No gallstones, gallbladder wall thickening, or biliary dilatation. Pancreas: Unremarkable. No pancreatic ductal dilatation or surrounding inflammatory changes. Spleen: Normal in size without focal abnormality. Adrenals/Urinary Tract: Adrenal glands are unremarkable. Kidneys are normal, without renal calculi, focal lesion, or hydronephrosis. Bladder is unremarkable. Stomach/Bowel: Normal appearance of stomach and small bowel. Long segment of severe wall thickening with heaped up margins and nodularity of the mid transverse colon and separate segment of moderate wall thickening of the cecum. No evidence for perforation or abscess. No findings of obstruction. Normal appendix. Vascular/Lymphatic: Aortic atherosclerosis. No enlarged abdominal or pelvic lymph nodes. Reproductive: Prostatic calcifications. Other: Right anterior lower abdominal wall injection sites. Musculoskeletal: No fracture is seen. IMPRESSION: Long segment of severe wall thickening of mid transverse colon and moderate wall thickening in the cecum. Findings probably represent infectious or inflammatory colitis (if inflammatory Crohn's is favored given absence of rectosigmoid disease and discontinuity). Additionally, transverse segment wall thickening demonstrates heaped up margins and nodularity, underlying adenocarcinoma is not excluded. No obstruction, perforation, abscess. Electronically Signed   By: Kristine Garbe M.D.   On: 12/31/2017 17:35      Scheduled Meds: . arformoterol  15 mcg Nebulization BID  . aspirin EC  81 mg Oral Daily  . budesonide (PULMICORT) nebulizer solution  0.5 mg  Nebulization BID  . enoxaparin (LOVENOX) injection  40 mg Subcutaneous Q24H  . montelukast  10 mg Oral QHS  . nitroGLYCERIN  0.5 inch Topical Q8H  . polyethylene glycol  17 g Oral Daily  . prasugrel  10 mg Oral Daily  . ramipril  1.25 mg Oral Daily  . sodium chloride flush  3 mL Intravenous Q12H   Continuous Infusions: . sodium chloride    . piperacillin-tazobactam (ZOSYN)  IV 3.375 g (01/01/18 0917)     LOS: 2 days    Time spent: 30 minutes   Dessa Phi, DO [facility] [email or web address] Password [email or web address] 01/01/2018, 10:00 AM

## 2018-01-02 LAB — BASIC METABOLIC PANEL
ANION GAP: 9 (ref 5–15)
BUN: 12 mg/dL (ref 6–20)
CHLORIDE: 106 mmol/L (ref 101–111)
CO2: 21 mmol/L — ABNORMAL LOW (ref 22–32)
Calcium: 8.5 mg/dL — ABNORMAL LOW (ref 8.9–10.3)
Creatinine, Ser: 1.12 mg/dL (ref 0.61–1.24)
GFR calc Af Amer: >60 mL/min (ref >60)
GFR calc non Af Amer: >60 mL/min (ref >60)
GLUCOSE: 98 mg/dL (ref 65–99)
Potassium: 4.4 mmol/L (ref 3.5–5.1)
Sodium: 136 mmol/L (ref 135–145)

## 2018-01-02 LAB — CBC
HEMATOCRIT: 31.9 % — AB (ref 39.0–52.0)
HEMOGLOBIN: 10.9 g/dL — AB (ref 13.0–17.0)
MCH: 28.6 pg (ref 26.0–34.0)
MCHC: 34.2 g/dL (ref 30.0–36.0)
MCV: 83.7 fL (ref 78.0–100.0)
Platelets: 296 10*3/uL (ref 150–400)
RBC: 3.81 MIL/uL — ABNORMAL LOW (ref 4.22–5.81)
RDW: 15.6 % — AB (ref 11.5–15.5)
WBC: 11.4 10*3/uL — ABNORMAL HIGH (ref 4.0–10.5)

## 2018-01-02 MED ORDER — SODIUM CHLORIDE 0.9% FLUSH
3.0000 mL | Freq: Two times a day (BID) | INTRAVENOUS | Status: DC
  Administered 2018-01-02: 3 mL via INTRAVENOUS

## 2018-01-02 MED ORDER — SODIUM CHLORIDE 0.9 % IV SOLN: 250.0000 mL | INTRAVENOUS | Status: DC | PRN

## 2018-01-02 MED ORDER — SODIUM CHLORIDE 0.9% FLUSH: 3.0000 mL | INTRAVENOUS | Status: DC | PRN

## 2018-01-02 MED ORDER — ASPIRIN 81 MG PO CHEW
81.0000 mg | CHEWABLE_TABLET | ORAL | Status: AC
  Administered 2018-01-03: 81 mg via ORAL
  Filled 2018-01-02: qty 1

## 2018-01-02 MED ORDER — SODIUM CHLORIDE 0.9 % WEIGHT BASED INFUSION
1.0000 mL/kg/h | INTRAVENOUS | Status: DC
  Administered 2018-01-02: 1 mL/kg/h via INTRAVENOUS
  Administered 2018-01-03: 250 mL via INTRAVENOUS
  Administered 2018-01-03: 1 mL/kg/h via INTRAVENOUS

## 2018-01-02 NOTE — H&P (View-Only) (Signed)
Subjective:  Agent denies any chest pain states breathing has improved. Abdominal pain markedly improved wanted to proceed with the PCI.  Objective:  Vital Signs in the last 24 hours: Temp:  [98.2 F (36.8 C)-98.4 F (36.9 C)] 98.3 F (36.8 C) (02/18 0500) Pulse Rate:  [81-90] 81 (02/18 0500) Resp:  [18-25] 21 (02/18 0500) BP: (89-101)/(66-70) 89/66 (02/18 0500) SpO2:  [97 %-98 %] 97 % (02/18 0500) Weight:  [66.7 kg (147 lb 1.6 oz)] 66.7 kg (147 lb 1.6 oz) (02/18 0500)  Intake/Output from previous day: 02/17 0701 - 02/18 0700 In: 270 [P.O.:120; IV Piggyback:150] Out: 675 [Urine:675] Intake/Output from this shift: Total I/O In: -  Out: 300 [Urine:300]  Physical Exam: Neck: no adenopathy, no carotid bruit, no JVD and supple, symmetrical, trachea midline Lungs: Right lung field decreased breath sound at bases Heart: regular rate and rhythm, S1, S2 normal and 3/6 systolic murmur noted Abdomen: soft, non-tender; bowel sounds normal; no masses,  no organomegaly Extremities: extremities normal, atraumatic, no cyanosis or edema  Lab Results: Recent Labs    12/31/17 0428 01/02/18 0451  WBC 10.1 11.4*  HGB 12.1* 10.9*  PLT 323 296   Recent Labs    01/01/18 0444 01/02/18 0451  NA 133* 136  K 4.6 4.4  CL 104 106  CO2 20* 21*  GLUCOSE 104* 98  BUN 17 12  CREATININE 1.06 1.12   No results for input(s): TROPONINI in the last 72 hours.  Invalid input(s): CK, MB Hepatic Function Panel No results for input(s): PROT, ALBUMIN, AST, ALT, ALKPHOS, BILITOT, BILIDIR, IBILI in the last 72 hours. No results for input(s): CHOL in the last 72 hours. No results for input(s): PROTIME in the last 72 hours.  Imaging: Imaging results have been reviewed and Ct Abdomen Pelvis W Contrast  Result Date: 12/31/2017 CLINICAL DATA:  62 y/o M; history of lung cancer post chemo and radiation as well as pneumonectomy. Patient presents with worsening shortness of breath and generalized abdominal  pain. EXAM: CT ABDOMEN AND PELVIS WITH CONTRAST TECHNIQUE: Multidetector CT imaging of the abdomen and pelvis was performed using the standard protocol following bolus administration of intravenous contrast. CONTRAST:  152mL ISOVUE-300 IOPAMIDOL (ISOVUE-300) INJECTION 61% COMPARISON:  03/18/2011 CT abdomen and pelvis. FINDINGS: Lower chest: Left pneumonectomy postsurgical changes. Hepatobiliary: No focal liver abnormality is seen. No gallstones, gallbladder wall thickening, or biliary dilatation. Pancreas: Unremarkable. No pancreatic ductal dilatation or surrounding inflammatory changes. Spleen: Normal in size without focal abnormality. Adrenals/Urinary Tract: Adrenal glands are unremarkable. Kidneys are normal, without renal calculi, focal lesion, or hydronephrosis. Bladder is unremarkable. Stomach/Bowel: Normal appearance of stomach and small bowel. Long segment of severe wall thickening with heaped up margins and nodularity of the mid transverse colon and separate segment of moderate wall thickening of the cecum. No evidence for perforation or abscess. No findings of obstruction. Normal appendix. Vascular/Lymphatic: Aortic atherosclerosis. No enlarged abdominal or pelvic lymph nodes. Reproductive: Prostatic calcifications. Other: Right anterior lower abdominal wall injection sites. Musculoskeletal: No fracture is seen. IMPRESSION: Long segment of severe wall thickening of mid transverse colon and moderate wall thickening in the cecum. Findings probably represent infectious or inflammatory colitis (if inflammatory Crohn's is favored given absence of rectosigmoid disease and discontinuity). Additionally, transverse segment wall thickening demonstrates heaped up margins and nodularity, underlying adenocarcinoma is not excluded. No obstruction, perforation, abscess. Electronically Signed   By: Kristine Garbe M.D.   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 noninvasive stress testing versus left cardiac catheterization possible PTCA stenting its risk and benefits i.e. death MI stroke need for emergency CABG local vascular complications etc. and consents for PCI   LOS: 3 days    [name] 01/02/2018, 11:47 AM

## 2018-01-02 NOTE — Progress Notes (Signed)
[facility] PCCM  Chart reviewed PCCM available PRN Call if questions  Roselie Awkward, MD [place] [facility] Pager: [phone] Cell: [phone] After 3pm or if no response, call [phone]

## 2018-01-02 NOTE — Progress Notes (Signed)
PROGRESS NOTE    Ronald [name]  JAS:[id number] DOB: 1956-05-31 DOA: 12/29/2017 PCP: [name], MD     Brief Narrative:  [name] a 62 y.o.malewith medical history significant ofcombined systolic and diastolic CHF last EF 41-93% with grade 2 diastolic dysfunction, HTN, CAD s/p PCI, and history of lung cancer status post chemo/radiation and pneumonectomy; who presents with complaints of progressively worsening shortness of breath over the last 2 days.He complains of a cough, but reports that it is hard for him to get anything up. He reports utilizing Lasix on an as-needed basis.He also complains of substernal sharp chest pain that is most notable with eating solid foods.In the ED, his blood pressure was low with CXR concerning for CHF. PCCM was consulted due to hypotension. Cardiology consulted for CHF.   Assessment & Plan:   Active Problems:   Lung cancer (HCC)   COPD (chronic obstructive pulmonary disease) (HCC)   Acute on chronic congestive heart failure (HCC)   Hypotension  Acute decompensated diastolic HF secondary to valvular heart disease Hx CAD s/p PCI  -Cardiology following  -Troponin x 3 negative -BNP 475.8 -Echo EF 50%, mild LVH, moderate mitral regurg  -Aspirin/Effient, altace  -Note plan for cath/PCI once colitis improves   Hypotension -Hold coreg, lasix, spironolactone   Acute colitis  -AXR negative -CT abd/pelvis with severe wall thickening mid transverse colon, moderate wall thickening cecum. Likely infectious vs inflammatory colitis. Underlying adenocarcinoma not excluded -Start IV zosyn, morphine for pain control, full liquid diet. Hold off IVF in setting of CHF -Abdominal pain with interval improvement   Odynophagia -Last 3 days prior to admission. No odynophagia this admission, diet without issue. He was seen by [facility]/Dr. Oletta Lamas in 2017 for colonoscopy. Would pursue work up as outpatient  -Denies any issues today    COPD -Without exacerbation. Continue nebs   Concern for URI at time of admission  -He has no focal infiltrate, procalcitonin negative, he is afebrile with normal WBC. Resp PCR and strep pneumo Ag negative    DVT prophylaxis: lovenox Code Status: full Family Communication: no family at bedside  Disposition Plan: pending improvement in colitis, PCI planned   Consultants:   Cardiology  Procedures:   None   Antimicrobials:  Anti-infectives (From admission, onward)   Start     Dose/Rate Route Frequency Ordered Stop   01/01/18 0815  piperacillin-tazobactam (ZOSYN) IVPB 3.375 g     3.375 g 12.5 mL/hr over 240 Minutes Intravenous Every 8 hours 01/01/18 0809     12/30/17 0630  azithromycin (ZITHROMAX) 500 mg in sodium chloride 0.9 % 250 mL IVPB  Status:  Discontinued     500 mg 250 mL/hr over 60 Minutes Intravenous Daily 12/30/17 0610 12/30/17 1355   12/30/17 0630  cefTRIAXone (ROCEPHIN) 1 g in sodium chloride 0.9 % 100 mL IVPB  Status:  Discontinued     1 g 200 mL/hr over 30 Minutes Intravenous Daily 12/30/17 0615 12/30/17 1355   12/30/17 0015  cefTRIAXone (ROCEPHIN) 1 g in sodium chloride 0.9 % 100 mL IVPB  Status:  Discontinued     1 g 200 mL/hr over 30 Minutes Intravenous  Once 12/30/17 0006 12/30/17 0053   12/30/17 0015  azithromycin (ZITHROMAX) tablet 500 mg  Status:  Discontinued     500 mg Oral  Once 12/30/17 0006 12/30/17 0053       Subjective: No complaints, abdominal pain is better. Tolerated full liquid diet without any issues    Objective: Vitals:  01/01/18 1700 01/01/18 1945 01/01/18 2019 01/02/18 0500  BP: 101/70 93/67  (!) 89/66  Pulse: 90 82  81  Resp: 18 (!) 25  (!) 21  Temp: 98.3 F (36.8 C) 98.4 F (36.9 C)  98.3 F (36.8 C)  TempSrc: Oral Oral  Oral  SpO2: 98% 97% 97% 97%  Weight:    66.7 kg (147 lb 1.6 oz)  Height:        Intake/Output Summary (Last 24 hours) at 01/02/2018 0931 Last data filed at 01/02/2018 0500 Gross per 24 hour   Intake 150 ml  Output 675 ml  Net -525 ml   Filed Weights   12/31/17 0425 01/01/18 0254 01/02/18 0500  Weight: 66.8 kg (147 lb 3.2 oz) 66.9 kg (147 lb 6.4 oz) 66.7 kg (147 lb 1.6 oz)    Examination: General exam: Appears calm and comfortable  Respiratory system: Clear to auscultation. Respiratory effort normal. Cardiovascular system: S1 & S2 heard, RRR. +murmur Gastrointestinal system: Abdomen is nondistended, soft and nontender to palpation. No organomegaly or masses felt. Normal bowel sounds heard. Central nervous system: Alert and oriented. No focal neurological deficits. Extremities: Symmetric 5 x 5 power. Skin: No rashes, lesions or ulcers Psychiatry: Judgement and insight appear normal. Mood & affect appropriate.    Data Reviewed: I have personally reviewed following labs and imaging studies  CBC: Recent Labs  Lab 12/29/17 2246 12/29/17 2300 12/30/17 0548 12/31/17 0428 01/02/18 0451  WBC 7.1  --  7.0 10.1 11.4*  NEUTROABS 4.9  --   --   --   --   HGB 12.1* 13.3 11.6* 12.1* 10.9*  HCT 35.3* 39.0 34.1* 35.5* 31.9*  MCV 84.2  --  84.2 83.7 83.7  PLT 293  --  315 323 893   Basic Metabolic Panel: Recent Labs  Lab 12/29/17 2246 12/29/17 2300 12/30/17 0548 12/31/17 0428 01/01/18 0444 01/02/18 0451  NA 133* 140  --  134* 133* 136  K 4.1 3.8  --  5.0 4.6 4.4  CL 104 107  --  105 104 106  CO2 20*  --   --  19* 20* 21*  GLUCOSE 101* 88  --  117* 104* 98  BUN 26* 11  --  25* 17 12  CREATININE 1.21 0.70  --  1.13 1.06 1.12  CALCIUM 8.8*  --   --  8.9 8.5* 8.5*  MG  --   --  2.1  --   --   --   PHOS  --   --  3.6  --   --   --    GFR: Estimated Creatinine Clearance: 56.3 mL/min (by C-G formula based on SCr of 1.12 mg/dL). Liver Function Tests: No results for input(s): AST, ALT, ALKPHOS, BILITOT, PROT, ALBUMIN in the last 168 hours. No results for input(s): LIPASE, AMYLASE in the last 168 hours. No results for input(s): AMMONIA in the last 168  hours. Coagulation Profile: No results for input(s): INR, PROTIME in the last 168 hours. Cardiac Enzymes: Recent Labs  Lab 12/30/17 0308 12/30/17 0952  TROPONINI <0.03 <0.03   BNP (last 3 results) No results for input(s): PROBNP in the last 8760 hours. HbA1C: No results for input(s): HGBA1C in the last 72 hours. CBG: No results for input(s): GLUCAP in the last 168 hours. Lipid Profile: No results for input(s): CHOL, HDL, LDLCALC, TRIG, CHOLHDL, LDLDIRECT in the last 72 hours. Thyroid Function Tests: No results for input(s): TSH, T4TOTAL, FREET4, T3FREE, THYROIDAB in the last 72 hours.  Anemia Panel: No results for input(s): VITAMINB12, FOLATE, FERRITIN, TIBC, IRON, RETICCTPCT in the last 72 hours. Sepsis Labs: Recent Labs  Lab 12/30/17 0114 12/30/17 0548  PROCALCITON  --  <0.10  LATICACIDVEN 0.97  --     Recent Results (from the past 240 hour(s))  Blood Culture (routine x 2)     Status: None (Preliminary result)   Collection Time: 12/30/17  6:12 AM  Result Value Ref Range Status   Specimen Description BLOOD RIGHT ANTECUBITAL  Final   Special Requests   Final    IN BOTH AEROBIC AND ANAEROBIC BOTTLES Blood Culture adequate volume   Culture   Final    NO GROWTH 2 DAYS Performed at [facility], [address] [place], [place] [address]    Report Status PENDING  Incomplete  Blood Culture (routine x 2)     Status: None (Preliminary result)   Collection Time: 12/30/17  6:17 AM  Result Value Ref Range Status   Specimen Description BLOOD LEFT ANTECUBITAL  Final   Special Requests IN PEDIATRIC BOTTLE Blood Culture adequate volume  Final   Culture   Final    NO GROWTH 2 DAYS Performed at [facility], [place] [address] [place], [place] [address]    Report Status PENDING  Incomplete  Respiratory Panel by PCR     Status: None   Collection Time: 12/30/17  3:42 PM  Result Value Ref Range Status   Adenovirus NOT DETECTED NOT DETECTED Final   Coronavirus 229E NOT  DETECTED NOT DETECTED Final   Coronavirus HKU1 NOT DETECTED NOT DETECTED Final   Coronavirus NL63 NOT DETECTED NOT DETECTED Final   Coronavirus OC43 NOT DETECTED NOT DETECTED Final   Metapneumovirus NOT DETECTED NOT DETECTED Final   Rhinovirus / Enterovirus NOT DETECTED NOT DETECTED Final   Influenza A NOT DETECTED NOT DETECTED Final   Influenza B NOT DETECTED NOT DETECTED Final   Parainfluenza Virus 1 NOT DETECTED NOT DETECTED Final   Parainfluenza Virus 2 NOT DETECTED NOT DETECTED Final   Parainfluenza Virus 3 NOT DETECTED NOT DETECTED Final   Parainfluenza Virus 4 NOT DETECTED NOT DETECTED Final   Respiratory Syncytial Virus NOT DETECTED NOT DETECTED Final   Bordetella pertussis NOT DETECTED NOT DETECTED Final   Chlamydophila pneumoniae NOT DETECTED NOT DETECTED Final   Mycoplasma pneumoniae NOT DETECTED NOT DETECTED Final    Comment: Performed at [facility], [place] [address], [place], [place] [address]  MRSA PCR Screening     Status: None   Collection Time: 12/30/17  3:42 PM  Result Value Ref Range Status   MRSA by PCR NEGATIVE NEGATIVE Final    Comment:        The GeneXpert MRSA Assay (FDA approved for NASAL specimens only), is one component of a comprehensive MRSA colonization surveillance program. It is not intended to diagnose MRSA infection nor to guide or monitor treatment for MRSA infections. Performed at [facility], [place] [address] [place],  [address]        Radiology Studies: Ct Abdomen Pelvis W Contrast  Result Date: 12/31/2017 CLINICAL DATA:  62 y/o M; history of lung cancer post chemo and radiation as well as pneumonectomy. Patient presents with worsening shortness of breath and generalized abdominal pain. EXAM: CT ABDOMEN AND PELVIS WITH CONTRAST TECHNIQUE: Multidetector CT imaging of the abdomen and pelvis was performed using the standard protocol following bolus administration of intravenous contrast. CONTRAST:  146mL ISOVUE-300  IOPAMIDOL (ISOVUE-300) INJECTION  61% COMPARISON:  03/18/2011 CT abdomen and pelvis. FINDINGS: Lower chest: Left pneumonectomy postsurgical changes. Hepatobiliary: No focal liver abnormality is seen. No gallstones, gallbladder wall thickening, or biliary dilatation. Pancreas: Unremarkable. No pancreatic ductal dilatation or surrounding inflammatory changes. Spleen: Normal in size without focal abnormality. Adrenals/Urinary Tract: Adrenal glands are unremarkable. Kidneys are normal, without renal calculi, focal lesion, or hydronephrosis. Bladder is unremarkable. Stomach/Bowel: Normal appearance of stomach and small bowel. Long segment of severe wall thickening with heaped up margins and nodularity of the mid transverse colon and separate segment of moderate wall thickening of the cecum. No evidence for perforation or abscess. No findings of obstruction. Normal appendix. Vascular/Lymphatic: Aortic atherosclerosis. No enlarged abdominal or pelvic lymph nodes. Reproductive: Prostatic calcifications. Other: Right anterior lower abdominal wall injection sites. Musculoskeletal: No fracture is seen. IMPRESSION: Long segment of severe wall thickening of mid transverse colon and moderate wall thickening in the cecum. Findings probably represent infectious or inflammatory colitis (if inflammatory Crohn's is favored given absence of rectosigmoid disease and discontinuity). Additionally, transverse segment wall thickening demonstrates heaped up margins and nodularity, underlying adenocarcinoma is not excluded. No obstruction, perforation, abscess. Electronically Signed   By: Kristine Garbe M.D.   On: 12/31/2017 17:35      Scheduled Meds: . arformoterol  15 mcg Nebulization BID  . aspirin EC  81 mg Oral Daily  . budesonide (PULMICORT) nebulizer solution  0.5 mg Nebulization BID  . enoxaparin (LOVENOX) injection  40 mg Subcutaneous Q24H  . montelukast  10 mg Oral QHS  . nitroGLYCERIN  0.5 inch Topical Q8H  .  prasugrel  10 mg Oral Daily  . ramipril  1.25 mg Oral Daily  . sodium chloride flush  3 mL Intravenous Q12H   Continuous Infusions: . sodium chloride    . piperacillin-tazobactam (ZOSYN)  IV 3.375 g (01/02/18 0503)     LOS: 3 days    Time spent: 30 minutes   Dessa Phi, DO [facility] [email or web address] Password [facility] 01/02/2018, 9:31 AM

## 2018-01-02 NOTE — Progress Notes (Signed)
Subjective:  Agent denies any chest pain states breathing has improved. Abdominal pain markedly improved wanted to proceed with the PCI.  Objective:  Vital Signs in the last 24 hours: Temp:  [98.2 F (36.8 C)-98.4 F (36.9 C)] 98.3 F (36.8 C) (02/18 0500) Pulse Rate:  [81-90] 81 (02/18 0500) Resp:  [18-25] 21 (02/18 0500) BP: (89-101)/(66-70) 89/66 (02/18 0500) SpO2:  [97 %-98 %] 97 % (02/18 0500) Weight:  [66.7 kg (147 lb 1.6 oz)] 66.7 kg (147 lb 1.6 oz) (02/18 0500)  Intake/Output from previous day: 02/17 0701 - 02/18 0700 In: 270 [P.O.:120; IV Piggyback:150] Out: 675 [Urine:675] Intake/Output from this shift: Total I/O In: -  Out: 300 [Urine:300]  Physical Exam: Neck: no adenopathy, no carotid bruit, no JVD and supple, symmetrical, trachea midline Lungs: Right lung field decreased breath sound at bases Heart: regular rate and rhythm, S1, S2 normal and 3/6 systolic murmur noted Abdomen: soft, non-tender; bowel sounds normal; no masses,  no organomegaly Extremities: extremities normal, atraumatic, no cyanosis or edema  Lab Results: Recent Labs    12/31/17 0428 01/02/18 0451  WBC 10.1 11.4*  HGB 12.1* 10.9*  PLT 323 296   Recent Labs    01/01/18 0444 01/02/18 0451  NA 133* 136  K 4.6 4.4  CL 104 106  CO2 20* 21*  GLUCOSE 104* 98  BUN 17 12  CREATININE 1.06 1.12   No results for input(s): TROPONINI in the last 72 hours.  Invalid input(s): CK, MB Hepatic Function Panel No results for input(s): PROT, ALBUMIN, AST, ALT, ALKPHOS, BILITOT, BILIDIR, IBILI in the last 72 hours. No results for input(s): CHOL in the last 72 hours. No results for input(s): PROTIME in the last 72 hours.  Imaging: Imaging results have been reviewed and Ct Abdomen Pelvis W Contrast  Result Date: 12/31/2017 CLINICAL DATA:  63 y/o M; history of lung cancer post chemo and radiation as well as pneumonectomy. Patient presents with worsening shortness of breath and generalized abdominal  pain. EXAM: CT ABDOMEN AND PELVIS WITH CONTRAST TECHNIQUE: Multidetector CT imaging of the abdomen and pelvis was performed using the standard protocol following bolus administration of intravenous contrast. CONTRAST:  141mL ISOVUE-300 IOPAMIDOL (ISOVUE-300) INJECTION 61% COMPARISON:  03/18/2011 CT abdomen and pelvis. FINDINGS: Lower chest: Left pneumonectomy postsurgical changes. Hepatobiliary: No focal liver abnormality is seen. No gallstones, gallbladder wall thickening, or biliary dilatation. Pancreas: Unremarkable. No pancreatic ductal dilatation or surrounding inflammatory changes. Spleen: Normal in size without focal abnormality. Adrenals/Urinary Tract: Adrenal glands are unremarkable. Kidneys are normal, without renal calculi, focal lesion, or hydronephrosis. Bladder is unremarkable. Stomach/Bowel: Normal appearance of stomach and small bowel. Long segment of severe wall thickening with heaped up margins and nodularity of the mid transverse colon and separate segment of moderate wall thickening of the cecum. No evidence for perforation or abscess. No findings of obstruction. Normal appendix. Vascular/Lymphatic: Aortic atherosclerosis. No enlarged abdominal or pelvic lymph nodes. Reproductive: Prostatic calcifications. Other: Right anterior lower abdominal wall injection sites. Musculoskeletal: No fracture is seen. IMPRESSION: Long segment of severe wall thickening of mid transverse colon and moderate wall thickening in the cecum. Findings probably represent infectious or inflammatory colitis (if inflammatory Crohn's is favored given absence of rectosigmoid disease and discontinuity). Additionally, transverse segment wall thickening demonstrates heaped up margins and nodularity, underlying adenocarcinoma is not excluded. No obstruction, perforation, abscess. Electronically Signed   By: Kristine Garbe M.D.   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 noninvasive stress testing versus left cardiac catheterization possible PTCA stenting its risk and benefits i.e. death MI stroke need for emergency CABG local vascular complications etc. and consents for PCI   LOS: 3 days    [name] 01/02/2018, 11:47 AM

## 2018-01-03 ENCOUNTER — Inpatient Hospital Stay (HOSPITAL_COMMUNITY)
Admission: EM | Disposition: A | Discharge status: Home / Self Care | Payer: Self-pay | Source: Home / Self Care | Attending: Internal Medicine

## 2018-01-03 HISTORY — PX: LEFT HEART CATH AND CORONARY ANGIOGRAPHY: CATH118249

## 2018-01-03 LAB — CBC
HCT: 33.5 % — ABNORMAL LOW (ref 39.0–52.0)
HEMOGLOBIN: 11.4 g/dL — AB (ref 13.0–17.0)
MCH: 28.6 pg (ref 26.0–34.0)
MCHC: 34 g/dL (ref 30.0–36.0)
MCV: 84.2 fL (ref 78.0–100.0)
Platelets: 325 10*3/uL (ref 150–400)
RBC: 3.98 MIL/uL — ABNORMAL LOW (ref 4.22–5.81)
RDW: 15.6 % — ABNORMAL HIGH (ref 11.5–15.5)
WBC: 9 10*3/uL (ref 4.0–10.5)

## 2018-01-03 LAB — BASIC METABOLIC PANEL
ANION GAP: 11 (ref 5–15)
BUN: 6 mg/dL (ref 6–20)
CALCIUM: 8.8 mg/dL — AB (ref 8.9–10.3)
CHLORIDE: 107 mmol/L (ref 101–111)
CO2: 20 mmol/L — AB (ref 22–32)
Creatinine, Ser: 1.12 mg/dL (ref 0.61–1.24)
GFR calc Af Amer: >60 mL/min (ref >60)
GFR calc non Af Amer: >60 mL/min (ref >60)
GLUCOSE: 92 mg/dL (ref 65–99)
Potassium: 4.6 mmol/L (ref 3.5–5.1)
Sodium: 138 mmol/L (ref 135–145)

## 2018-01-03 LAB — PROTIME-INR
INR: 1.08
PROTHROMBIN TIME: 14 s (ref 11.4–15.2)

## 2018-01-03 SURGERY — LEFT HEART CATH AND CORONARY ANGIOGRAPHY
Anesthesia: LOCAL

## 2018-01-03 MED ORDER — LIDOCAINE HCL (PF) 1 % IJ SOLN
INTRAMUSCULAR | Status: AC
  Filled 2018-01-03: qty 30

## 2018-01-03 MED ORDER — LIDOCAINE HCL (PF) 1 % IJ SOLN
INTRAMUSCULAR | Status: DC | PRN
  Administered 2018-01-03: 5 mL
  Administered 2018-01-03: 16 mL

## 2018-01-03 MED ORDER — FENTANYL CITRATE (PF) 100 MCG/2ML IJ SOLN
INTRAMUSCULAR | Status: DC | PRN
  Administered 2018-01-03 (×2): 25 ug via INTRAVENOUS

## 2018-01-03 MED ORDER — SODIUM CHLORIDE 0.9 % IV SOLN: 250.0000 mL | INTRAVENOUS | Status: DC | PRN

## 2018-01-03 MED ORDER — MIDAZOLAM HCL 2 MG/2ML IJ SOLN
INTRAMUSCULAR | Status: DC | PRN
  Administered 2018-01-03: 1 mg via INTRAVENOUS

## 2018-01-03 MED ORDER — HEPARIN (PORCINE) IN NACL 2-0.9 UNIT/ML-% IJ SOLN
INTRAMUSCULAR | Status: AC
  Filled 2018-01-03: qty 1000

## 2018-01-03 MED ORDER — SODIUM CHLORIDE 0.9% FLUSH: 3.0000 mL | INTRAVENOUS | Status: DC | PRN

## 2018-01-03 MED ORDER — IOPAMIDOL (ISOVUE-370) INJECTION 76%
INTRAVENOUS | Status: AC
  Filled 2018-01-03: qty 100

## 2018-01-03 MED ORDER — ZOLPIDEM TARTRATE 5 MG PO TABS
5.0000 mg | ORAL_TABLET | Freq: Once | ORAL | Status: AC
  Administered 2018-01-03: 5 mg via ORAL
  Filled 2018-01-03: qty 1

## 2018-01-03 MED ORDER — FENTANYL CITRATE (PF) 100 MCG/2ML IJ SOLN
INTRAMUSCULAR | Status: AC
  Filled 2018-01-03: qty 2

## 2018-01-03 MED ORDER — IOPAMIDOL (ISOVUE-370) INJECTION 76%
INTRAVENOUS | Status: DC | PRN
  Administered 2018-01-03: 45 mL via INTRA_ARTERIAL

## 2018-01-03 MED ORDER — HEPARIN (PORCINE) IN NACL 2-0.9 UNIT/ML-% IJ SOLN
INTRAMUSCULAR | Status: AC | PRN
  Administered 2018-01-03 (×2): 500 mL

## 2018-01-03 MED ORDER — SODIUM CHLORIDE 0.9% FLUSH
3.0000 mL | Freq: Two times a day (BID) | INTRAVENOUS | Status: DC
  Administered 2018-01-03 (×2): 3 mL via INTRAVENOUS

## 2018-01-03 MED ORDER — MIDAZOLAM HCL 2 MG/2ML IJ SOLN
INTRAMUSCULAR | Status: AC
  Filled 2018-01-03: qty 2

## 2018-01-03 MED ORDER — SODIUM CHLORIDE 0.9 % IV SOLN: INTRAVENOUS | Status: DC

## 2018-01-03 SURGICAL SUPPLY — 6 items
CATH INFINITI 5FR MULTPACK ANG (CATHETERS) ×2 IMPLANT
KIT HEART LEFT (KITS) ×2 IMPLANT
PACK CARDIAC CATHETERIZATION (CUSTOM PROCEDURE TRAY) ×2 IMPLANT
SHEATH PINNACLE 5F 10CM (SHEATH) ×2 IMPLANT
TRANSDUCER W/STOPCOCK (MISCELLANEOUS) ×2 IMPLANT
WIRE EMERALD 3MM-J .035X150CM (WIRE) ×2 IMPLANT

## 2018-01-03 NOTE — Progress Notes (Addendum)
Site area: Right groin a 5 french arterial sheath was removed  Site Prior to Removal:  Level 0  Pressure Applied For 20 MINUTES    Bedrest Beginning at 1100am  Manual:   Yes.    Patient Status During Pull:  stable  Post Pull Groin Site:  Level 0  Post Pull Instructions Given:  Yes.    Post Pull Pulses Present:  Yes.    Dressing Applied:  Yes.    Comments:  VS remain stable

## 2018-01-03 NOTE — Interval H&P Note (Signed)
Cath Lab Visit (complete for each Cath Lab visit)  Clinical Evaluation Leading to the Procedure:   ACS: No.  Non-ACS:    Anginal Classification: CCS III  Anti-ischemic medical therapy: Maximal Therapy (2 or more classes of medications)  Non-Invasive Test Results: No non-invasive testing performed  Prior CABG: No previous CABG      History and Physical Interval Note:  01/03/2018 9:42 AM  Ronald [name]  has presented today for surgery, with the diagnosis of unstable angina  The various methods of treatment have been discussed with the patient and family. After consideration of risks, benefits and other options for treatment, the patient has consented to  Procedure(s): LEFT HEART CATH AND CORONARY ANGIOGRAPHY (N/A) as a surgical intervention .  The patient's history has been reviewed, patient examined, no change in status, stable for surgery.  I have reviewed the patient's chart and labs.  Questions were answered to the patient's satisfaction.     [name]

## 2018-01-03 NOTE — Care Management Important Message (Signed)
Important Message  Patient Details  Name: Ronald [name] MRN: [id number] Date of Birth: [birth year]   Medicare Important Message Given:  Yes    [name] [address] 01/03/2018, 11:52 AM

## 2018-01-03 NOTE — Plan of Care (Signed)
  Cardiovascular: Ability to achieve and maintain adequate cardiovascular perfusion will improve 01/03/2018 0414 - Progressing by Jacqlyn Larsen, RN Note Plan LHC for 2/19. Pt in agreeable to procedure, consent signed and in chart. Pt NPO after midnight.

## 2018-01-03 NOTE — Progress Notes (Signed)
PROGRESS NOTE    Ronald [name]  INO:[id number] DOB: Nov 07, 1956 DOA: 12/29/2017 PCP: [name], MD     Brief Narrative:  [name] a 62 y.o.malewith medical history significant ofcombined systolic and diastolic CHF last EF 09-62% with grade 2 diastolic dysfunction, HTN, CAD s/p PCI, and history of lung cancer status post chemo/radiation and pneumonectomy; who presents with complaints of progressively worsening shortness of breath over the last 2 days.He complains of a cough, but reports that it is hard for him to get anything up. He reports utilizing Lasix on an as-needed basis.He also complains of substernal sharp chest pain that is most notable with eating solid foods.In the ED, his blood pressure was low with CXR concerning for CHF. PCCM was consulted due to hypotension. Cardiology consulted for CHF. Patient continued to have abdominal pain and CT abd/pelvis was completed; this revealed colitis. He was started on IV zosyn with improvement. He underwent heart cath 2/19.   Assessment & Plan:   Active Problems:   Lung cancer ([place])   COPD (chronic obstructive pulmonary disease) (HCC)   Acute on chronic congestive heart failure (HCC)   Hypotension  Acute decompensated diastolic HF secondary to valvular heart disease Hx CAD s/p PCI  -Cardiology following  -Troponin x 3 negative -BNP 475.8 -Echo EF 50%, mild LVH, moderate mitral regurg  -Aspirin/Effient, altace  -Heart cath 2/19, plan for medical management   Hypotension -Hold coreg, lasix, spironolactone  -BP remained marginal with SBP 90-100s  Acute colitis  -AXR negative -CT abd/pelvis with severe wall thickening mid transverse colon, moderate wall thickening cecum. Likely infectious vs inflammatory colitis. Underlying adenocarcinoma not excluded -IV zosyn, morphine for pain control -Abdominal pain with improvement   Odynophagia -Last 3 days prior to admission. No odynophagia this admission, diet  without issue. He was seen by [facility]/Dr. Oletta Lamas in 2017 for colonoscopy. Would pursue work up as outpatient  -No further issues with odynophagia during hospitalization   COPD -Without exacerbation. Continue nebs   Concern for URI at time of admission  -He has no focal infiltrate, procalcitonin negative, he is afebrile with normal WBC. Resp PCR and strep pneumo Ag negative    DVT prophylaxis: lovenox Code Status: full Family Communication: family at bedside  Disposition Plan: pending improvement in colitis, possibly DC home 2/20    Consultants:   Cardiology  Procedures:   None   Antimicrobials:  Anti-infectives (From admission, onward)   Start     Dose/Rate Route Frequency Ordered Stop   01/01/18 0815  piperacillin-tazobactam (ZOSYN) IVPB 3.375 g     3.375 g 12.5 mL/hr over 240 Minutes Intravenous Every 8 hours 01/01/18 0809     12/30/17 0630  azithromycin (ZITHROMAX) 500 mg in sodium chloride 0.9 % 250 mL IVPB  Status:  Discontinued     500 mg 250 mL/hr over 60 Minutes Intravenous Daily 12/30/17 0610 12/30/17 1355   12/30/17 0630  cefTRIAXone (ROCEPHIN) 1 g in sodium chloride 0.9 % 100 mL IVPB  Status:  Discontinued     1 g 200 mL/hr over 30 Minutes Intravenous Daily 12/30/17 0615 12/30/17 1355   12/30/17 0015  cefTRIAXone (ROCEPHIN) 1 g in sodium chloride 0.9 % 100 mL IVPB  Status:  Discontinued     1 g 200 mL/hr over 30 Minutes Intravenous  Once 12/30/17 0006 12/30/17 0053   12/30/17 0015  azithromycin (ZITHROMAX) tablet 500 mg  Status:  Discontinued     500 mg Oral  Once 12/30/17 0006 12/30/17 0053  Subjective: Abdominal pain continues to improve slowly. Had normal BM this morning. No appetite.    Objective: Vitals:   01/03/18 1045 01/03/18 1050 01/03/18 1055 01/03/18 1319  BP: 101/65 93/66 106/77 96/66  Pulse: 81 81 79 74  Resp: (!) 21 (!) 23 (!) 21 (!) 24  Temp:    98 F (36.7 C)  TempSrc:    Oral  SpO2: 96% 97% 96% 98%  Weight:      Height:         Intake/Output Summary (Last 24 hours) at 01/03/2018 1331 Last data filed at 01/03/2018 1320 Gross per 24 hour  Intake 1344.67 ml  Output 1325 ml  Net 19.67 ml   Filed Weights   01/01/18 0254 01/02/18 0500 01/03/18 0524  Weight: 66.9 kg (147 lb 6.4 oz) 66.7 kg (147 lb 1.6 oz) 66.4 kg (146 lb 4.8 oz)    Examination: General exam: Appears calm and comfortable  Respiratory system: Clear to auscultation. Respiratory effort normal. Cardiovascular system: S1 & S2 heard, RRR. No JVD, murmurs, rubs, gallops or clicks. No pedal edema. Gastrointestinal system: Abdomen is nondistended, soft and nontender. No organomegaly or masses felt. Normal bowel sounds heard. Central nervous system: Alert and oriented. No focal neurological deficits. Extremities: Symmetric 5 x 5 power. Skin: No rashes, lesions or ulcers Psychiatry: Judgement and insight appear normal. Mood & affect appropriate.    Data Reviewed: I have personally reviewed following labs and imaging studies  CBC: Recent Labs  Lab 12/29/17 2246 12/29/17 2300 12/30/17 0548 12/31/17 0428 01/02/18 0451 01/03/18 0350  WBC 7.1  --  7.0 10.1 11.4* 9.0  NEUTROABS 4.9  --   --   --   --   --   HGB 12.1* 13.3 11.6* 12.1* 10.9* 11.4*  HCT 35.3* 39.0 34.1* 35.5* 31.9* 33.5*  MCV 84.2  --  84.2 83.7 83.7 84.2  PLT 293  --  315 323 296 169   Basic Metabolic Panel: Recent Labs  Lab 12/29/17 2246 12/29/17 2300 12/30/17 0548 12/31/17 0428 01/01/18 0444 01/02/18 0451 01/03/18 0350  NA 133* 140  --  134* 133* 136 138  K 4.1 3.8  --  5.0 4.6 4.4 4.6  CL 104 107  --  105 104 106 107  CO2 20*  --   --  19* 20* 21* 20*  GLUCOSE 101* 88  --  117* 104* 98 92  BUN 26* 11  --  25* 17 12 6   CREATININE 1.21 0.70  --  1.13 1.06 1.12 1.12  CALCIUM 8.8*  --   --  8.9 8.5* 8.5* 8.8*  MG  --   --  2.1  --   --   --   --   PHOS  --   --  3.6  --   --   --   --    GFR: Estimated Creatinine Clearance: 56.2 mL/min (by C-G formula based on SCr of  1.12 mg/dL). Liver Function Tests: No results for input(s): AST, ALT, ALKPHOS, BILITOT, PROT, ALBUMIN in the last 168 hours. No results for input(s): LIPASE, AMYLASE in the last 168 hours. No results for input(s): AMMONIA in the last 168 hours. Coagulation Profile: Recent Labs  Lab 01/03/18 0350  INR 1.08   Cardiac Enzymes: Recent Labs  Lab 12/30/17 0308 12/30/17 0952  TROPONINI <0.03 <0.03   BNP (last 3 results) No results for input(s): PROBNP in the last 8760 hours. HbA1C: No results for input(s): HGBA1C in the last 72 hours. CBG:  No results for input(s): GLUCAP in the last 168 hours. Lipid Profile: No results for input(s): CHOL, HDL, LDLCALC, TRIG, CHOLHDL, LDLDIRECT in the last 72 hours. Thyroid Function Tests: No results for input(s): TSH, T4TOTAL, FREET4, T3FREE, THYROIDAB in the last 72 hours. Anemia Panel: No results for input(s): VITAMINB12, FOLATE, FERRITIN, TIBC, IRON, RETICCTPCT in the last 72 hours. Sepsis Labs: Recent Labs  Lab 12/30/17 0114 12/30/17 0548  PROCALCITON  --  <0.10  LATICACIDVEN 0.97  --     Recent Results (from the past 240 hour(s))  Blood Culture (routine x 2)     Status: None (Preliminary result)   Collection Time: 12/30/17  6:12 AM  Result Value Ref Range Status   Specimen Description BLOOD RIGHT ANTECUBITAL  Final   Special Requests   Final    IN BOTH AEROBIC AND ANAEROBIC BOTTLES Blood Culture adequate volume   Culture   Final    NO GROWTH 3 DAYS Performed at [facility], [address], [place], [place] [address]    Report Status PENDING  Incomplete  Blood Culture (routine x 2)     Status: None (Preliminary result)   Collection Time: 12/30/17  6:17 AM  Result Value Ref Range Status   Specimen Description BLOOD LEFT ANTECUBITAL  Final   Special Requests IN PEDIATRIC BOTTLE Blood Culture adequate volume  Final   Culture   Final    NO GROWTH 3 DAYS Performed at [facility], [place] [address], [place], [place]  [address]    Report Status PENDING  Incomplete  Respiratory Panel by PCR     Status: None   Collection Time: 12/30/17  3:42 PM  Result Value Ref Range Status   Adenovirus NOT DETECTED NOT DETECTED Final   Coronavirus 229E NOT DETECTED NOT DETECTED Final   Coronavirus HKU1 NOT DETECTED NOT DETECTED Final   Coronavirus NL63 NOT DETECTED NOT DETECTED Final   Coronavirus OC43 NOT DETECTED NOT DETECTED Final   Metapneumovirus NOT DETECTED NOT DETECTED Final   Rhinovirus / Enterovirus NOT DETECTED NOT DETECTED Final   Influenza A NOT DETECTED NOT DETECTED Final   Influenza B NOT DETECTED NOT DETECTED Final   Parainfluenza Virus 1 NOT DETECTED NOT DETECTED Final   Parainfluenza Virus 2 NOT DETECTED NOT DETECTED Final   Parainfluenza Virus 3 NOT DETECTED NOT DETECTED Final   Parainfluenza Virus 4 NOT DETECTED NOT DETECTED Final   Respiratory Syncytial Virus NOT DETECTED NOT DETECTED Final   Bordetella pertussis NOT DETECTED NOT DETECTED Final   Chlamydophila pneumoniae NOT DETECTED NOT DETECTED Final   Mycoplasma pneumoniae NOT DETECTED NOT DETECTED Final    Comment: Performed at [facility], [place] [address] [place] [employer] [place], [place] [address]  MRSA PCR Screening     Status: None   Collection Time: 12/30/17  3:42 PM  Result Value Ref Range Status   MRSA by PCR NEGATIVE NEGATIVE Final    Comment:        The GeneXpert MRSA Assay (FDA approved for NASAL specimens only), is one component of a comprehensive MRSA colonization surveillance program. It is not intended to diagnose MRSA infection nor to guide or monitor treatment for MRSA infections. Performed at [facility], [place] [address] [place], [place] [address]        Radiology Studies: No results found.    Scheduled Meds: . arformoterol  15 mcg Nebulization BID  . aspirin EC  81 mg Oral Daily  . budesonide (PULMICORT) nebulizer solution  0.5 mg Nebulization BID  . montelukast  10 mg Oral QHS  . nitroGLYCERIN  0.5 inch  Topical Q8H  . prasugrel  10 mg Oral Daily  . ramipril  1.25 mg Oral Daily  . sodium chloride flush  3 mL Intravenous Q12H  . sodium chloride flush  3 mL Intravenous Q12H   Continuous Infusions: . sodium chloride    . sodium chloride    . sodium chloride    . piperacillin-tazobactam (ZOSYN)  IV 3.375 g (01/03/18 0624)     LOS: 4 days    Time spent: 30 minutes   Dessa Phi, DO [facility] [email or web address] Password [email or web address] 01/03/2018, 1:31 PM

## 2018-01-04 ENCOUNTER — Telehealth: Payer: Self-pay | Admitting: *Deleted

## 2018-01-04 ENCOUNTER — Encounter (HOSPITAL_COMMUNITY): Payer: Self-pay | Admitting: Cardiology

## 2018-01-04 LAB — CBC
HCT: 34.5 % — ABNORMAL LOW (ref 39.0–52.0)
Hemoglobin: 11.6 g/dL — ABNORMAL LOW (ref 13.0–17.0)
MCH: 28.8 pg (ref 26.0–34.0)
MCHC: 33.6 g/dL (ref 30.0–36.0)
MCV: 85.6 fL (ref 78.0–100.0)
PLATELETS: 335 10*3/uL (ref 150–400)
RBC: 4.03 MIL/uL — ABNORMAL LOW (ref 4.22–5.81)
RDW: 16 % — AB (ref 11.5–15.5)
WBC: 6.4 10*3/uL (ref 4.0–10.5)

## 2018-01-04 LAB — CULTURE, BLOOD (ROUTINE X 2)
Culture: NO GROWTH
Culture: NO GROWTH
Special Requests: ADEQUATE

## 2018-01-04 LAB — BASIC METABOLIC PANEL
Anion gap: 10 (ref 5–15)
BUN: 9 mg/dL (ref 6–20)
CALCIUM: 8.7 mg/dL — AB (ref 8.9–10.3)
CHLORIDE: 105 mmol/L (ref 101–111)
CO2: 20 mmol/L — ABNORMAL LOW (ref 22–32)
CREATININE: 1.15 mg/dL (ref 0.61–1.24)
GFR calc Af Amer: >60 mL/min (ref >60)
Glucose, Bld: 129 mg/dL — ABNORMAL HIGH (ref 65–99)
Potassium: 3.9 mmol/L (ref 3.5–5.1)
SODIUM: 135 mmol/L (ref 135–145)

## 2018-01-04 MED ORDER — PRASUGREL HCL 10 MG PO TABS: 5.0000 mg | ORAL_TABLET | Freq: Every day | ORAL | Status: DC

## 2018-01-04 MED ORDER — AMOXICILLIN-POT CLAVULANATE 875-125 MG PO TABS: 1.0000 | ORAL_TABLET | Freq: Two times a day (BID) | ORAL | 0 refills | Status: AC

## 2018-01-04 MED ORDER — RAMIPRIL 1.25 MG PO CAPS: 1.2500 mg | ORAL_CAPSULE | Freq: Every day | ORAL | 0 refills | Status: DC

## 2018-01-04 MED FILL — Heparin Sodium (Porcine) 2 Unit/ML in Sodium Chloride 0.9%: INTRAMUSCULAR | Qty: 1000 | Status: AC

## 2018-01-04 NOTE — Discharge Summary (Signed)
Physician Discharge Summary  Ronald [name] YTK:[id number] DOB: 1956/06/15 DOA: 12/29/2017  PCP: [name], MD  Admit date: 12/29/2017 Discharge date: 01/04/2018  Admitted From: Home Disposition: Home  Recommendations for Outpatient Follow-up:  1. Follow up with PCP in 1 week 2. Please obtain BMP/CBC in one week 3. Please follow up on the following pending results: None  Home Health: None Equipment/Devices: None  Discharge Condition: Stable CODE STATUS: Full code Diet recommendation: Heart healthy   Brief/Interim Summary:  Admission HPI written by Norval Morton, MD   HPI: Ronald [name] is a 62 y.o. 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  He complains of a cough, but reports that it is hard for him to get anything up.  He reports utilizing Lasix on an as-needed basis.  He also complains of substernal sharp chest pain that is most notable with eating. Associated symptoms include complaints of numbness and orthopnea.  Denies any diaphoresis, fever, diarrhea, nausea, vomiting, or leg/abdominal swelling.    ED Course: Admission into the emergency department patient was noted to be afebrile, pulse 72-88, respiration 15-29, blood pressure 82/46-109/60, O2 saturation 95-99% on room air.  Patient was given initially approximately 400 mL of normal saline IV fluids questioning sepsis.  However, labs revealed elevated BNP of 475.8 and chest x-ray was concerning for CHF with pulmonary edema.  Influenza screen was negative due to soft blood pressures patient was given glucagon to try and reverse any beta blockade.  Dr. Terrence Dupont cardiology was consult and reports will see patient in a.m. recommending placing on Levophed or  dopamine temporarily if needed.    Hospital course:  Acute decompensated diastolic HF secondary to valvular heart disease Hx CAD s/p PCI  Cardiology consulted. Troponin negative x3. Patient underwent heart cath on 2/19 with recommendations for medical management. Per cardiology recommendations, decreased Effient to 5 mg. Outpatient follow-up.  Hypotension Held Hold coreg, lasix, spironolactone initially. Resume on discharge per cardiology recommendations.  Acute colitis  CT abd/pelvis with severe wall thickening mid transverse colon, moderate wall thickening cecum. Likely infectious vs inflammatory colitis. Underlying adenocarcinoma not excluded. This was discussed with the patient. Recommend follow-up with GI outpatient. Treated with Zosyn and transitioned to Augmentin on discharge.  Odynophagia Last 3 days prior to admission. No odynophagia this admission, diet without issue. He was seen by [facility]/Dr. Oletta Lamas in 2017 for colonoscopy. outpatient follow-up.  COPD Without exacerbation. Continued nebs   Concern for URI at time of admission  No focal infiltrate, procalcitonin negative, he is afebrile with normal WBC. Resp PCR and strep pneumo Ag negative.    Discharge Diagnoses:  Active Problems:   Lung cancer ([facility])   COPD (chronic obstructive pulmonary disease) (HCC)   Acute on chronic congestive heart failure (HCC)   Hypotension    Discharge Instructions  Discharge Instructions    Call MD for:  persistant nausea and vomiting   Complete by:  As directed    Call MD for:  severe uncontrolled pain   Complete by:  As directed    Call MD for:  temperature >100.4   Complete by:  As directed    Diet - low sodium heart healthy   Complete by:  As directed    Increase activity slowly   Complete  by:  As directed      Allergies as of 01/04/2018      Reactions   Penicillins Itching      Medication List    STOP taking these medications   guaiFENesin-dextromethorphan  100-10 MG/5ML syrup Commonly known as:  ROBITUSSIN DM     TAKE these medications   albuterol 108 (90 Base) MCG/ACT inhaler Commonly known as:  PROVENTIL HFA;VENTOLIN HFA Inhale 2 puffs into the lungs every 4 (four) hours as needed for wheezing or shortness of breath.   amoxicillin-clavulanate 875-125 MG tablet Commonly known as:  AUGMENTIN Take 1 tablet by mouth 2 (two) times daily for 7 days.   aspirin 325 MG tablet Take 325 mg by mouth 2 (two) times a week.   budesonide-formoterol 160-4.5 MCG/ACT inhaler Commonly known as:  SYMBICORT Inhale 2 puffs into the lungs 2 (two) times daily.   carvedilol 3.125 MG tablet Commonly known as:  COREG Take 1 tablet (3.125 mg total) by mouth 2 (two) times daily with a meal.   furosemide 40 MG tablet Commonly known as:  LASIX Take 1 tablet (40 mg total) by mouth 2 (two) times daily. Twice a day for 3 days, then back to normal dosing. What changed:    when to take this  reasons to take this  additional instructions   guaiFENesin 600 MG 12 hr tablet Commonly known as:  MUCINEX Take 2 tablets (1,200 mg total) by mouth 2 (two) times daily. What changed:    when to take this  reasons to take this   montelukast 10 MG tablet Commonly known as:  SINGULAIR Take 1 tablet (10 mg total) by mouth at bedtime.   nitroGLYCERIN 0.4 MG SL tablet Commonly known as:  NITROSTAT Place 1 tablet (0.4 mg total) under the tongue every 5 (five) minutes x 3 doses as needed for chest pain.   ondansetron 4 MG disintegrating tablet Commonly known as:  ZOFRAN ODT Take 1 tablet (4 mg total) by mouth every 8 (eight) hours as needed for nausea or vomiting.   polyethylene glycol packet Commonly known as:  MIRALAX Take 17 g by mouth daily as needed for moderate constipation or severe constipation.   prasugrel 10 MG Tabs tablet Commonly known as:  EFFIENT Take 0.5 tablets (5 mg total) by mouth daily. What changed:  how much to take   ramipril 1.25 MG  capsule Commonly known as:  ALTACE Take 1 capsule (1.25 mg total) by mouth daily. Start taking on:  01/05/2018   spironolactone 25 MG tablet Commonly known as:  ALDACTONE Take 1 tablet (25 mg total) by mouth daily.   traMADol 50 MG tablet Commonly known as:  ULTRAM Take 1 tablet (50 mg total) by mouth every 12 (twelve) hours as needed for severe pain.      Follow-up Information    [name], MD Follow up.   Specialty:  Internal Medicine Contact information: [place] [address] [phone]        [name], MD. Schedule an appointment as soon as possible for a visit in 2 week(s).   Specialty:  Cardiology Contact information: [place] [address] [phone]          Allergies  Allergen Reactions  . Penicillins Itching    Consultations:  Cardiology   Procedures/Studies: Dg Chest 2 View  Result Date: 12/29/2017 CLINICAL DATA:  Difficulty breathing and swallowing. EXAM: CHEST  2 VIEW COMPARISON:  01/27/2017 FINDINGS: Postpneumonectomy change redemonstrated  with resected posterior left sixth and chronic deformity of the left seventh ribs. Compensatory hyperinflation of the right lung with interval increase in pulmonary vascular congestion since prior. No significant pleural effusion. No pneumothorax. No pneumonic consolidation. No acute osseous abnormality. Heart is obscured by volume loss ectomy. IMPRESSION: Findings compatible with CHF in the setting of prior left pneumonectomy. Electronically Signed   By: Ashley Royalty M.D.   On: 12/29/2017 22:59   Dg Abd 1 View  Result Date: 12/31/2017 CLINICAL DATA:  Abdominal pain. EXAM: ABDOMEN - 1 VIEW COMPARISON:  Abdominal x-ray dated January 31, 2017. FINDINGS: Nonobstructive bowel gas pattern. No radiopaque calculi or other abnormal calcification. No acute osseous abnormality. IMPRESSION: Negative. Electronically Signed   By: Titus Dubin M.D.   On: 12/31/2017  09:08   Ct Abdomen Pelvis W Contrast  Result Date: 12/31/2017 CLINICAL DATA:  62 y/o M; history of lung cancer post chemo and radiation as well as pneumonectomy. Patient presents with worsening shortness of breath and generalized abdominal pain. EXAM: CT ABDOMEN AND PELVIS WITH CONTRAST TECHNIQUE: Multidetector CT imaging of the abdomen and pelvis was performed using the standard protocol following bolus administration of intravenous contrast. CONTRAST:  142mL ISOVUE-300 IOPAMIDOL (ISOVUE-300) INJECTION 61% COMPARISON:  03/18/2011 CT abdomen and pelvis. FINDINGS: Lower chest: Left pneumonectomy postsurgical changes. Hepatobiliary: No focal liver abnormality is seen. No gallstones, gallbladder wall thickening, or biliary dilatation. Pancreas: Unremarkable. No pancreatic ductal dilatation or surrounding inflammatory changes. Spleen: Normal in size without focal abnormality. Adrenals/Urinary Tract: Adrenal glands are unremarkable. Kidneys are normal, without renal calculi, focal lesion, or hydronephrosis. Bladder is unremarkable. Stomach/Bowel: Normal appearance of stomach and small bowel. Long segment of severe wall thickening with heaped up margins and nodularity of the mid transverse colon and separate segment of moderate wall thickening of the cecum. No evidence for perforation or abscess. No findings of obstruction. Normal appendix. Vascular/Lymphatic: Aortic atherosclerosis. No enlarged abdominal or pelvic lymph nodes. Reproductive: Prostatic calcifications. Other: Right anterior lower abdominal wall injection sites. Musculoskeletal: No fracture is seen. IMPRESSION: Long segment of severe wall thickening of mid transverse colon and moderate wall thickening in the cecum. Findings probably represent infectious or inflammatory colitis (if inflammatory Crohn's is favored given absence of rectosigmoid disease and discontinuity). Additionally, transverse segment wall thickening demonstrates heaped up margins and  nodularity, underlying adenocarcinoma is not excluded. No obstruction, perforation, abscess. Electronically Signed   By: Kristine Garbe M.D.   On: 12/31/2017 17:35     Echocardiogram (12/30/2017) Study Conclusions  - Left ventricle: The cavity size was mildly dilated. Wall   thickness was increased in a pattern of mild LVH. Systolic   function was normal. The estimated ejection fraction was in the   range of 50% to 55%. Doppler parameters are consistent with both   elevated ventricular end-diastolic filling pressure and elevated   left atrial filling pressure. - Aortic valve: There was mild regurgitation. - Mitral valve: Thickened leaflets with mild diastolic doming ?   rheumatic. There was moderate regurgitation. - Left atrium: The atrium was mildly dilated. - Right ventricle: The cavity size was mildly dilated. - Right atrium: The atrium was mildly dilated. - Atrial septum: No defect or patent foramen ovale was identified. - Tricuspid valve: There was moderate regurgitation. - Pulmonary arteries: PA peak pressure: 52 mm Hg (S).  Left heart catheterization (01/03/2018)  Conclusion   Prox Cx to Mid Cx lesion is 100% stenosed.  Prox RCA to Mid RCA lesion is 30% stenosed.  Prox LAD  lesion is 40% stenosed.  Ost Ramus to Ramus lesion is 30% stenosed.        Subjective: No chest pain or dyspnea.  Discharge Exam: Vitals:   01/04/18 0849 01/04/18 1023  BP:  100/65  Pulse: 81   Resp: 18   Temp:    SpO2: 96%    Vitals:   01/04/18 0552 01/04/18 0555 01/04/18 0849 01/04/18 1023  BP: 101/66   100/65  Pulse: 80  81   Resp: (!) 21  18   Temp:      TempSrc:      SpO2: 96%  96%   Weight:  67.8 kg (149 lb 8 oz)    Height:        General: Pt is alert, awake, not in acute distress    The results of significant diagnostics from this hospitalization (including imaging, microbiology, ancillary and laboratory) are listed below for reference.      Microbiology: Recent Results (from the past 240 hour(s))  Blood Culture (routine x 2)     Status: None (Preliminary result)   Collection Time: 12/30/17  6:12 AM  Result Value Ref Range Status   Specimen Description BLOOD RIGHT ANTECUBITAL  Final   Special Requests   Final    IN BOTH AEROBIC AND ANAEROBIC BOTTLES Blood Culture adequate volume   Culture   Final    NO GROWTH 4 DAYS Performed at [facility], [address] [place], [place] [address]    Report Status PENDING  Incomplete  Blood Culture (routine x 2)     Status: None (Preliminary result)   Collection Time: 12/30/17  6:17 AM  Result Value Ref Range Status   Specimen Description BLOOD LEFT ANTECUBITAL  Final   Special Requests IN PEDIATRIC BOTTLE Blood Culture adequate volume  Final   Culture   Final    NO GROWTH 4 DAYS Performed at [facility], [place] [address] [place], [place] [address]    Report Status PENDING  Incomplete  Respiratory Panel by PCR     Status: None   Collection Time: 12/30/17  3:42 PM  Result Value Ref Range Status   Adenovirus NOT DETECTED NOT DETECTED Final   Coronavirus 229E NOT DETECTED NOT DETECTED Final   Coronavirus HKU1 NOT DETECTED NOT DETECTED Final   Coronavirus NL63 NOT DETECTED NOT DETECTED Final   Coronavirus OC43 NOT DETECTED NOT DETECTED Final   Metapneumovirus NOT DETECTED NOT DETECTED Final   Rhinovirus / Enterovirus NOT DETECTED NOT DETECTED Final   Influenza A NOT DETECTED NOT DETECTED Final   Influenza B NOT DETECTED NOT DETECTED Final   Parainfluenza Virus 1 NOT DETECTED NOT DETECTED Final   Parainfluenza Virus 2 NOT DETECTED NOT DETECTED Final   Parainfluenza Virus 3 NOT DETECTED NOT DETECTED Final   Parainfluenza Virus 4 NOT DETECTED NOT DETECTED Final   Respiratory Syncytial Virus NOT DETECTED NOT DETECTED Final   Bordetella pertussis NOT DETECTED NOT DETECTED Final   Chlamydophila pneumoniae NOT DETECTED NOT DETECTED Final   Mycoplasma pneumoniae NOT  DETECTED NOT DETECTED Final    Comment: Performed at [facility], [place] [address], [place], [place] [address]  MRSA PCR Screening     Status: None   Collection Time: 12/30/17  3:42 PM  Result Value Ref Range Status   MRSA by PCR NEGATIVE NEGATIVE Final    Comment:        The GeneXpert MRSA Assay (FDA approved for NASAL specimens only), is one  component of a comprehensive MRSA colonization surveillance program. It is not intended to diagnose MRSA infection nor to guide or monitor treatment for MRSA infections. Performed at [facility], [place] [address] [place], [place] [address]      Labs: BNP (last 3 results) Recent Labs    01/28/17 0140 12/29/17 2246  BNP 234.5* 268.3*   Basic Metabolic Panel: Recent Labs  Lab 12/30/17 0548 12/31/17 0428 01/01/18 0444 01/02/18 0451 01/03/18 0350 01/04/18 0626  NA  --  134* 133* 136 138 135  K  --  5.0 4.6 4.4 4.6 3.9  CL  --  105 104 106 107 105  CO2  --  19* 20* 21* 20* 20*  GLUCOSE  --  117* 104* 98 92 129*  BUN  --  25* 17 12 6 9   CREATININE  --  1.13 1.06 1.12 1.12 1.15  CALCIUM  --  8.9 8.5* 8.5* 8.8* 8.7*  MG 2.1  --   --   --   --   --   PHOS 3.6  --   --   --   --   --    Liver Function Tests: No results for input(s): AST, ALT, ALKPHOS, BILITOT, PROT, ALBUMIN in the last 168 hours. No results for input(s): LIPASE, AMYLASE in the last 168 hours. No results for input(s): AMMONIA in the last 168 hours. CBC: Recent Labs  Lab 12/29/17 2246  12/30/17 0548 12/31/17 0428 01/02/18 0451 01/03/18 0350 01/04/18 0626  WBC 7.1  --  7.0 10.1 11.4* 9.0 6.4  NEUTROABS 4.9  --   --   --   --   --   --   HGB 12.1*   < > 11.6* 12.1* 10.9* 11.4* 11.6*  HCT 35.3*   < > 34.1* 35.5* 31.9* 33.5* 34.5*  MCV 84.2  --  84.2 83.7 83.7 84.2 85.6  PLT 293  --  315 323 296 325 335   < > = values in this interval not displayed.   Cardiac Enzymes: Recent Labs  Lab 12/30/17 0308 12/30/17 0952  TROPONINI <0.03 <0.03    BNP: Invalid input(s): POCBNP CBG: No results for input(s): GLUCAP in the last 168 hours. D-Dimer No results for input(s): DDIMER in the last 72 hours. Hgb A1c No results for input(s): HGBA1C in the last 72 hours. Lipid Profile No results for input(s): CHOL, HDL, LDLCALC, TRIG, CHOLHDL, LDLDIRECT in the last 72 hours. Thyroid function studies No results for input(s): TSH, T4TOTAL, T3FREE, THYROIDAB in the last 72 hours.  Invalid input(s): FREET3 Anemia work up No results for input(s): VITAMINB12, FOLATE, FERRITIN, TIBC, IRON, RETICCTPCT in the last 72 hours. Urinalysis    Component Value Date/Time   COLORURINE COLORLESS (A) 12/30/2017 0021   APPEARANCEUR CLEAR 12/30/2017 0021   LABSPEC 1.005 12/30/2017 0021   PHURINE 5.0 12/30/2017 0021   GLUCOSEU NEGATIVE 12/30/2017 0021   HGBUR NEGATIVE 12/30/2017 0021   BILIRUBINUR NEGATIVE 12/30/2017 0021   KETONESUR NEGATIVE 12/30/2017 0021   PROTEINUR NEGATIVE 12/30/2017 0021   UROBILINOGEN 0.2 07/04/2015 0600   NITRITE NEGATIVE 12/30/2017 0021   LEUKOCYTESUR NEGATIVE 12/30/2017 0021   Sepsis Labs Invalid input(s): PROCALCITONIN,  WBC,  LACTICIDVEN Microbiology Recent Results (from the past 240 hour(s))  Blood Culture (routine x 2)     Status: None (Preliminary result)   Collection Time: 12/30/17  6:12 AM  Result Value Ref Range Status   Specimen Description BLOOD RIGHT ANTECUBITAL  Final   Special Requests  Final    IN BOTH AEROBIC AND ANAEROBIC BOTTLES Blood Culture adequate volume   Culture   Final    NO GROWTH 4 DAYS Performed at [facility], [place] [address], [place], [place] [address]    Report Status PENDING  Incomplete  Blood Culture (routine x 2)     Status: None (Preliminary result)   Collection Time: 12/30/17  6:17 AM  Result Value Ref Range Status   Specimen Description BLOOD LEFT ANTECUBITAL  Final   Special Requests IN PEDIATRIC BOTTLE Blood Culture adequate volume  Final   Culture   Final    NO  GROWTH 4 DAYS Performed at [facility], [place] [address], [place], [place] [address]    Report Status PENDING  Incomplete  Respiratory Panel by PCR     Status: None   Collection Time: 12/30/17  3:42 PM  Result Value Ref Range Status   Adenovirus NOT DETECTED NOT DETECTED Final   Coronavirus 229E NOT DETECTED NOT DETECTED Final   Coronavirus HKU1 NOT DETECTED NOT DETECTED Final   Coronavirus NL63 NOT DETECTED NOT DETECTED Final   Coronavirus OC43 NOT DETECTED NOT DETECTED Final   Metapneumovirus NOT DETECTED NOT DETECTED Final   Rhinovirus / Enterovirus NOT DETECTED NOT DETECTED Final   Influenza A NOT DETECTED NOT DETECTED Final   Influenza B NOT DETECTED NOT DETECTED Final   Parainfluenza Virus 1 NOT DETECTED NOT DETECTED Final   Parainfluenza Virus 2 NOT DETECTED NOT DETECTED Final   Parainfluenza Virus 3 NOT DETECTED NOT DETECTED Final   Parainfluenza Virus 4 NOT DETECTED NOT DETECTED Final   Respiratory Syncytial Virus NOT DETECTED NOT DETECTED Final   Bordetella pertussis NOT DETECTED NOT DETECTED Final   Chlamydophila pneumoniae NOT DETECTED NOT DETECTED Final   Mycoplasma pneumoniae NOT DETECTED NOT DETECTED Final    Comment: Performed at [facility], [place] [address], [place], [place] [address]  MRSA PCR Screening     Status: None   Collection Time: 12/30/17  3:42 PM  Result Value Ref Range Status   MRSA by PCR NEGATIVE NEGATIVE Final    Comment:        The GeneXpert MRSA Assay (FDA approved for NASAL specimens only), is one component of a comprehensive MRSA colonization surveillance program. It is not intended to diagnose MRSA infection nor to guide or monitor treatment for MRSA infections. Performed at [facility], [place] [address], [place], [place] [address]      SIGNED:   Cordelia Poche, MD [facility] 01/04/2018, 11:03 AM Pager [phone]  If 7PM-7AM, please contact night-coverage [email or web address] Password [email or web address]

## 2018-01-04 NOTE — Telephone Encounter (Signed)
I called pt and told him Dr Lyla Son needs to make referral with specific diagnosis.

## 2018-01-04 NOTE — Progress Notes (Signed)
The patient has been given discharge instructions along with a new medication list and what to take today. He has follow up appointments and and prescriptions to pick up. He has been educated on colitis and treatment along with post cardiac site care. He is discharging with his son via car.   Saddie Benders RN

## 2018-01-04 NOTE — Telephone Encounter (Signed)
Received call from pt stating that he had been in the hospital & was calling regarding making an appt with Dr Julien Nordmann.  Message to Dr Olene Floss

## 2018-01-04 NOTE — Discharge Instructions (Signed)
Colitis Colitis is inflammation of the colon. Colitis may last a short time (acute) or it may last a long time (chronic). What are the causes? This condition may be caused by:  Viruses.  Bacteria.  Reactions to medicine.  Certain autoimmune diseases, such as Crohn disease or ulcerative colitis.  What are the signs or symptoms? Symptoms of this condition include:  Diarrhea.  Passing bloody or tarry stool.  Pain.  Fever.  Vomiting.  Tiredness (fatigue).  Weight loss.  Bloating.  Sudden increase in abdominal pain.  Having fewer bowel movements than usual.  How is this diagnosed? This condition is diagnosed with a stool test or a blood test. You may also have other tests, including X-rays, a CT scan, or a colonoscopy. How is this treated? Treatment may include:  Resting the bowel. This involves not eating or drinking for a period of time.  Fluids that are given through an IV tube.  Medicine for pain and diarrhea.  Antibiotic medicines.  Cortisone medicines.  Surgery.  Follow these instructions at home: Eating and drinking  Follow instructions from your health care provider about eating or drinking restrictions.  Drink enough fluid to keep your urine clear or pale yellow.  Work with a dietitian to determine which foods cause your condition to flare up.  Avoid foods that cause flare-ups.  Eat a well-balanced diet. Medicines  Take over-the-counter and prescription medicines only as told by your health care provider.  If you were prescribed an antibiotic medicine, take it as told by your health care provider. Do not stop taking the antibiotic even if you start to feel better. General instructions  Keep all follow-up visits as told by your health care provider. This is important. Contact a health care provider if:  Your symptoms do not go away.  You develop new symptoms. Get help right away if:  You have a fever that does not go away with  treatment.  You develop chills.  You have extreme weakness, fainting, or dehydration.  You have repeated vomiting.  You develop severe pain in your abdomen.  You pass bloody or tarry stool. This information is not intended to replace advice given to you by your health care provider. Make sure you discuss any questions you have with your health care provider. Document Released: 12/09/2004 Document Revised: 04/08/2016 Document Reviewed: 02/24/2015 Elsevier Interactive Patient Education  2018 [employer]  

## 2018-01-04 NOTE — Telephone Encounter (Signed)
His last visit with me was more than 5 years ago. He needs to be seen as a new patient either with me or other provider. Thank you.

## 2018-01-04 NOTE — Progress Notes (Signed)
Subjective:  Doing well.  Denies any chest pain or shortness of breath.  He tolerated cardiac catheterization noted to have moderate in-stent LAD restenosis as before.  Left circumflex very small which is chronically occluded.  Abdominal pain markedly improved.  Objective:  Vital Signs in the last 24 hours: Temp:  [97.9 F (36.6 C)-98 F (36.7 C)] 97.9 F (36.6 C) (02/19 2024) Pulse Rate:  [71-94] 81 (02/20 0849) Resp:  [15-27] 18 (02/20 0849) BP: (90-116)/(62-73) 100/65 (02/20 1023) SpO2:  [95 %-98 %] 96 % (02/20 0849) Weight:  [67.8 kg (149 lb 8 oz)] 67.8 kg (149 lb 8 oz) (02/20 0555)  Intake/Output from previous day: 02/19 0701 - 02/20 0700 In: 1303 [P.O.:1100; I.V.:3; IV Piggyback:200] Out: 451 [Urine:450; Stool:1] Intake/Output from this shift: No intake/output data recorded.  Physical Exam: Neck: no adenopathy, no carotid bruit, no JVD and supple, symmetrical, trachea midline Lungs: right lung field clear to auscultation Heart: regular rate and rhythm, S1, S2 normal and 3/6 systolic murmur noted Abdomen: soft, non-tender; bowel sounds normal; no masses,  no organomegaly Extremities: extremities normal, atraumatic, no cyanosis or edema Right groin stable Lab Results: Recent Labs    01/03/18 0350 01/04/18 0626  WBC 9.0 6.4  HGB 11.4* 11.6*  PLT 325 335   Recent Labs    01/03/18 0350 01/04/18 0626  NA 138 135  K 4.6 3.9  CL 107 105  CO2 20* 20*  GLUCOSE 92 129*  BUN 6 9  CREATININE 1.12 1.15   No results for input(s): TROPONINI in the last 72 hours.  Invalid input(s): CK, MB Hepatic Function Panel No results for input(s): PROT, ALBUMIN, AST, ALT, ALKPHOS, BILITOT, BILIDIR, IBILI in the last 72 hours. No results for input(s): CHOL in the last 72 hours. No results for input(s): PROTIME in the last 72 hours.  Imaging: Imaging results have been reviewed and No results found.  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  Reduce Effient to 5 mg daily Okay to discharge from cardiac point of view  LOS: 5 days    [name] 01/04/2018, 11:18 AM

## 2018-01-16 ENCOUNTER — Telehealth: Payer: Self-pay | Admitting: Internal Medicine

## 2018-01-16 NOTE — Telephone Encounter (Signed)
Pt has been scheduled to see Dr. Julien Nordmann on 3/12 at 215pm. Pt aware to arrive 30 minutes early.

## 2018-01-24 ENCOUNTER — Other Ambulatory Visit: Payer: Self-pay | Admitting: *Deleted

## 2018-01-24 ENCOUNTER — Ambulatory Visit: Payer: Self-pay | Admitting: Internal Medicine

## 2018-01-24 ENCOUNTER — Other Ambulatory Visit: Payer: Self-pay

## 2018-01-24 DIAGNOSIS — C3400 Malignant neoplasm of unspecified main bronchus: Secondary | ICD-10-CM

## 2018-03-21 ENCOUNTER — Encounter: Payer: Self-pay | Admitting: *Deleted

## 2018-03-21 NOTE — Progress Notes (Signed)
Oncology Nurse Navigator Documentation  Oncology Nurse Navigator Flowsheets 03/21/2018  Navigator Location [facility]  Navigator Encounter Type Other/notified scheduling to call patient and re-schedule with Dr. Julien Nordmann.   Barriers/Navigation Needs Coordination of Care  Interventions Coordination of Care  Coordination of Care Other  Acuity Level 1  Time Spent with Patient 15

## 2018-03-22 ENCOUNTER — Telehealth: Payer: Self-pay | Admitting: Internal Medicine

## 2018-03-22 NOTE — Telephone Encounter (Signed)
Lft the pt a vm to cb and reschedule appt with Dr. Julien Nordmann.

## 2018-06-01 ENCOUNTER — Other Ambulatory Visit: Payer: Self-pay | Admitting: Internal Medicine

## 2018-06-01 DIAGNOSIS — N6310 Unspecified lump in the right breast, unspecified quadrant: Secondary | ICD-10-CM

## 2018-06-07 ENCOUNTER — Ambulatory Visit: Payer: Self-pay

## 2018-06-07 ENCOUNTER — Other Ambulatory Visit: Payer: Self-pay

## 2018-06-07 ENCOUNTER — Ambulatory Visit
Admission: RE | Admit: 2018-06-07 | Discharge: 2018-06-07 | Disposition: A | Discharge status: Home / Self Care | Payer: Medicare Other | Source: Ambulatory Visit | Attending: Internal Medicine | Admitting: Internal Medicine

## 2018-06-07 DIAGNOSIS — N6310 Unspecified lump in the right breast, unspecified quadrant: Secondary | ICD-10-CM

## 2019-07-16 ENCOUNTER — Other Ambulatory Visit: Payer: Self-pay

## 2019-07-16 ENCOUNTER — Ambulatory Visit: Payer: Medicare Other | Attending: Orthopaedic Surgery

## 2019-07-16 DIAGNOSIS — M25611 Stiffness of right shoulder, not elsewhere classified: Secondary | ICD-10-CM | POA: Insufficient documentation

## 2019-07-16 DIAGNOSIS — M62838 Other muscle spasm: Secondary | ICD-10-CM | POA: Diagnosis present

## 2019-07-16 DIAGNOSIS — M25511 Pain in right shoulder: Secondary | ICD-10-CM | POA: Insufficient documentation

## 2019-07-16 NOTE — Therapy (Signed)
[place], [place], [address] Phone: [phone]   Fax:  [phone]  Physical Therapy Evaluation  Patient Details  Name: Ronald [name] MRN: [id number] Date of Birth: 09/02/1956 Referring Provider (PT): Ophelia Charter, MD   Encounter Date: 07/16/2019  PT End of Session - 07/16/19 0833    Visit Number  1    Number of Visits  12    Date for PT Re-Evaluation  08/24/19    Authorization Type  [employer] MCR/MCD    PT Start Time  0830    PT Stop Time  0910    PT Time Calculation (min)  40 min    Activity Tolerance  Patient tolerated treatment well;Patient limited by pain    Behavior During Therapy  [facility] for tasks assessed/performed       Past Medical History:  Diagnosis Date  . CHF (congestive heart failure) ([place])   . COPD (chronic obstructive pulmonary disease) ([place])   . Coronary artery disease   . Hypertension   . lung ca dx'd 2005   chemo/xrt comp 2005, lung ca  . Myocardial infarction ([place])   . Shortness of breath     Past Surgical History:  Procedure Laterality Date  . CARDIAC CATHETERIZATION N/A 10/13/2015   Procedure: Left Heart Cath and Coronary Angiography;  Surgeon: [name], MD;  Location: [facility];  Service: Cardiovascular;  Laterality: N/A;  . CORONARY ANGIOPLASTY WITH STENT PLACEMENT    . LEFT HEART CATH AND CORONARY ANGIOGRAPHY N/A 01/03/2018   Procedure: LEFT HEART CATH AND CORONARY ANGIOGRAPHY;  Surgeon: [name], MD;  Location: [facility];  Service: Cardiovascular;  Laterality: N/A;  . LEFT HEART CATHETERIZATION WITH CORONARY ANGIOGRAM N/A 04/07/2012   Procedure: LEFT HEART CATHETERIZATION WITH CORONARY ANGIOGRAM;  Surgeon: [name], MD;  Location: [facility];  Service: Cardiovascular;  Laterality: N/A;  . PNEUMONECTOMY  2005   left  . vocal cord surgery  2005    There were no vitals filed for this visit.   Subjective Assessment - 07/16/19 0840    Subjective  He reports Rt shoulder pain.  MD said OA.  No injury. One night began to have pain. no previous shoulder pain. He recieved injection without benenfit. sometimes better , sometimes worse    Limitations  Lifting;House hold activities   reaching overhead   Diagnostic tests  xray: OA    Patient Stated Goals  He wants to be able to reach and pick up items without pain    Currently in Pain?  No/denies    Pain Score  --   worse at night  to severe level   Pain Location  Shoulder    Pain Orientation  Right;Anterior;Posterior;Upper    Pain Descriptors / Indicators  Aching;Throbbing    Pain Type  Chronic pain    Pain Onset  More than a month ago    Pain Frequency  Intermittent    Aggravating Factors   reaching  and lifting    Pain Relieving Factors  medication         [facility] PT Assessment - 07/16/19 0001      Assessment   Medical Diagnosis  RT cuff syndrome    Referring Provider (PT)  Ophelia Charter, MD    Onset Date/Surgical Date  --   3 months ago   Hand Dominance  Right    Next MD Visit  not sure      Precautions  Precautions  None      Restrictions   Weight Bearing Restrictions  No      Balance Screen   Has the patient fallen in the past 6 months  No      Prior Function   Level of Independence  Needs assistance with homemaking    Vocation  On disability      Cognition   Overall Cognitive Status  Within Functional Limits for tasks assessed      ROM / Strength   AROM / PROM / Strength  AROM;PROM;Strength      AROM   AROM Assessment Site  Shoulder    Right/Left Shoulder  Right;Left    Right Shoulder Flexion  105 Degrees    Right Shoulder ABduction  95 Degrees    Right Shoulder Internal Rotation  0 Degrees    Right Shoulder External Rotation  70 Degrees    Right Shoulder Horizontal ABduction  3 Degrees    Right Shoulder Horizontal  ADduction  90 Degrees    Left Shoulder Flexion  160 Degrees    Left Shoulder ABduction  162 Degrees    Left Shoulder Internal  Rotation  30 Degrees    Left Shoulder External Rotation  93 Degrees    Left Shoulder Horizontal ABduction  15 Degrees    Left Shoulder Horizontal ADduction  105 Degrees      PROM   PROM Assessment Site  Shoulder      Strength   Overall Strength Comments  Grossly WNl with shoulder at 45 degrees aduciton with pain with flexion /abduction and rotation        Palpation   Palpation comment  tender and tight anterior shoulder and tender along scapula and posterior axilla                Objective measurements completed on examination: See above findings.              PT Education - 07/16/19 2778    Education Details  POC , HEP    Person(s) Educated  Patient    Methods  Explanation;Demonstration;Tactile cues;Verbal cues;Handout    Comprehension  Verbalized understanding;Returned demonstration       PT Short Term Goals - 07/16/19 0915      PT SHORT TERM GOAL #1   Title  He wiull be indpendent with initial HEP    Time  3    Period  Weeks    Status  New      PT SHORT TERM GOAL #2   Title  Active Rt shoulder flexion and abduction will increase to 130-140 degrees due to decr pain.    Time  3    Period  Weeks    Status  New      PT SHORT TERM GOAL #3   Title  He will report pain generally improved 25% with use,    Time  3    Period  Weeks    Status  New        PT Long Term Goals - 07/16/19 2423      PT LONG TERM GOAL #1   Title  He will be indpendent with all hEP issued    Time  6    Period  Weeks    Status  New      PT LONG TERM GOAL #2   Title  He willb e able to move Rt arm equal LT  with min pain    Time  6  Period  Weeks    Status  New      PT LONG TERM GOAL #3   Title  Hewill be able to lift 10 # from floor qwith 2/10 max pain.    Time  6    Period  Weeks    Status  New      PT LONG TERM GOAL #4   Title  He will be able to get dressed  with no pain.    Time  6    Period  Weeks    Status  New      PT LONG TERM GOAL #5   Title   He will report no sleep disturbance due to pain.    Time  6    Period  Weeks    Status  New      Additional Long Term Goals   Additional Long Term Goals  Yes      PT LONG TERM GOAL #6   Title  FOTO score improve to 35% limited or better to demo improve perceived function    Time  6    Period  Weeks    Status  New             Plan - 07/16/19 0834    Clinical Impression Statement  Mr  Nay presentw with chronic RT shoulder pain limiting ROM and use of RT arm for self care and home tasks.  His sleep is disturbed due to pain.   He should improve with skilled PT and a consistent HEP    Personal Factors and Comorbidities  Time since onset of injury/illness/exacerbation    Examination-Activity Limitations  Bathing;Reach Overhead;Carry;Dressing    Examination-Participation Restrictions  Community Activity    Stability/Clinical Decision Making  Stable/Uncomplicated    Clinical Decision Making  Low    Rehab Potential  Good    PT Frequency  2x / week    PT Duration  6 weeks    PT Treatment/Interventions  Cryotherapy;Electrical Stimulation;Iontophoresis 4mg /ml Dexamethasone;Moist Heat;Ultrasound;Therapeutic exercise;Passive range of motion;Dry needling;Patient/family education;Taping;Manual techniques    PT Next Visit Plan  MAnual and modalities , isometrics,    PT Home Exercise Plan  scpula ROM    Consulted and Agree with Plan of Care  Patient       Patient will benefit from skilled therapeutic intervention in order to improve the following deficits and impairments:  Increased muscle spasms, Pain, Decreased activity tolerance, Decreased strength, Decreased range of motion, Impaired UE functional use  Visit Diagnosis: Right shoulder pain, unspecified chronicity  Stiffness of right shoulder, not elsewhere classified  Other muscle spasm     Problem List Patient Active Problem List   Diagnosis Date Noted  . Acute on chronic congestive heart failure ([place]) 12/30/2017  .  Hypotension 12/30/2017  . History of pneumonectomy   . Odynophagia   . CAP (community acquired pneumonia) 01/28/2017  . AKI (acute kidney injury) ([place]) 01/28/2017  . Unstable angina ([place]) 10/10/2015  . COPD exacerbation ([place]) 07/04/2015  . Essential hypertension 07/04/2015  . Acute respiratory failure with hypoxia ([place]) 07/04/2015  . lung ca   . Major depressive disorder, single episode 05/01/2015  . Drug overdose, intentional ([place])   . Chronic systolic CHF (congestive heart failure) 10/30/2013  . COPD with acute exacerbation ([place]) 09/16/2012  . Tobacco use 09/16/2012  . COPD (chronic obstructive pulmonary disease) ([place]) 09/15/2012  . Status post cardiac catheterization 04/08/2012  . Acute systolic CHF (congestive heart failure) ([place]) 04/08/2012  . Tobacco  abuse 04/08/2012  . Acute bronchitis 04/08/2012  . CAD (coronary artery disease) 04/06/2012  . Lung cancer ([place]) 04/06/2012  . Mitral regurgitation 04/06/2012  . Cocaine abuse ([place]) 04/06/2012    Darrel Hoover  PT 07/16/2019, 9:29 AM  [facility] [address] [place], [place], [address] Phone: [phone]   Fax:  [phone]  Name: ROE WILNER MRN: [id number] Date of Birth: [birth year]

## 2019-07-16 NOTE — Patient Instructions (Signed)
Scapula retraction and elevation/depression  3 reps every hour

## 2019-07-30 ENCOUNTER — Ambulatory Visit: Payer: Medicare Other | Attending: Orthopaedic Surgery

## 2019-08-01 ENCOUNTER — Telehealth: Payer: Self-pay | Admitting: Physical Therapy

## 2019-08-01 ENCOUNTER — Ambulatory Visit: Payer: Medicare Other | Admitting: Physical Therapy

## 2019-08-01 NOTE — Telephone Encounter (Signed)
Spoke to patient who stated he forgot about his appointment. Reminded him of his next appointments and he plans to attend.

## 2019-08-06 ENCOUNTER — Ambulatory Visit: Payer: Medicare Other

## 2019-08-06 ENCOUNTER — Telehealth: Payer: Self-pay | Admitting: Physical Therapy

## 2019-08-06 NOTE — Telephone Encounter (Signed)
Called Ronald [name]  and informed him of missed appointment today at 10 AM and that his next appointment was on 08/08/19 at [facility] with Jessica.

## 2019-08-08 ENCOUNTER — Ambulatory Visit: Payer: Medicare Other | Admitting: Physical Therapy

## 2019-08-13 ENCOUNTER — Telehealth: Payer: Self-pay | Admitting: Physical Therapy

## 2019-08-13 ENCOUNTER — Ambulatory Visit: Payer: Medicare Other

## 2019-08-13 NOTE — Telephone Encounter (Signed)
Message left for Ronald [name] that he missed/no showed today's appointment and this was 3rd no show and all appointments were canceled. He was offered the option to call to schedule a follow up appointment if he wants over next 2 weeks and if we did not hear from him he would be discharged.

## 2019-08-15 ENCOUNTER — Ambulatory Visit: Payer: Medicare Other | Admitting: Physical Therapy

## 2019-08-20 ENCOUNTER — Ambulatory Visit: Payer: Medicare Other

## 2019-08-22 ENCOUNTER — Ambulatory Visit: Payer: Medicare Other | Admitting: Physical Therapy

## 2019-10-22 ENCOUNTER — Encounter: Payer: Self-pay | Admitting: *Deleted

## 2019-10-22 NOTE — Progress Notes (Signed)
I followed up with new patient coordinator to see if Ronald [name] has been set up with med onc.  He was a no show twice but did per referral needs med onc appt.

## 2021-09-07 DIAGNOSIS — D0920 Carcinoma in situ of unspecified eye: Secondary | ICD-10-CM | POA: Insufficient documentation

## 2022-08-07 DIAGNOSIS — G629 Polyneuropathy, unspecified: Secondary | ICD-10-CM | POA: Insufficient documentation

## 2022-08-27 DIAGNOSIS — I350 Nonrheumatic aortic (valve) stenosis: Secondary | ICD-10-CM | POA: Insufficient documentation

## 2022-10-09 DIAGNOSIS — E039 Hypothyroidism, unspecified: Secondary | ICD-10-CM | POA: Diagnosis present

## 2022-10-15 DIAGNOSIS — K219 Gastro-esophageal reflux disease without esophagitis: Secondary | ICD-10-CM | POA: Diagnosis present

## 2022-10-15 DIAGNOSIS — E782 Mixed hyperlipidemia: Secondary | ICD-10-CM | POA: Diagnosis present

## 2022-11-23 NOTE — Progress Notes (Signed)
Attempted to reach out pt via telephone to confirm his current address in hopes of having a CD of patient's imaging from [facility] sent to him. I was unable to reach the patient and there was no option to leave a VM.  I contacted [facility] [phone]) to request pathology reports and images. Unable to reach anyone. Left VM with name and contact number.

## 2022-11-24 ENCOUNTER — Other Ambulatory Visit: Payer: Self-pay

## 2022-11-24 NOTE — Progress Notes (Signed)
error 

## 2022-11-29 NOTE — Progress Notes (Signed)
I have reached out yet again to the [facility] [phone]) from whom we received the referral for this patient. I have left several voicemails requesting the imaging and pathology reports so we can establish his care here. I was unable to reach anyone at this time.

## 2022-11-30 NOTE — Progress Notes (Signed)
Received a return call from [facility]. I was transferred to Medical Records and was able to speak to [facility] in regards to requesting the patients pathology reports. She gave me the fax number and I faxed the request at this time. Currently awaiting a response.

## 2022-12-01 NOTE — Progress Notes (Signed)
I reached out to the pt in order to schedule an appointment with Dr.Mohamed and arrange his infusion appointment. I attempted to reach the pt at the contact listed for him and also his alternate contact Dyann Ruddle [phone]). Neither number was valid. I also reached out to the emergency contacts listed on his referral from [facility]. I was unable to reach his wife, and his brother, Jonny Ruiz, was unable to provide me with a phone number at this time. He told me he would reach out to the patient to get me a number and ask that I call back tomorrow after 10am.

## 2022-12-02 ENCOUNTER — Ambulatory Visit
Admission: RE | Admit: 2022-12-02 | Discharge: 2022-12-02 | Disposition: A | Discharge status: Home / Self Care | Payer: Self-pay | Source: Ambulatory Visit | Attending: Internal Medicine | Admitting: Internal Medicine

## 2022-12-02 ENCOUNTER — Other Ambulatory Visit: Payer: Self-pay

## 2022-12-02 DIAGNOSIS — C3490 Malignant neoplasm of unspecified part of unspecified bronchus or lung: Secondary | ICD-10-CM

## 2022-12-02 NOTE — Progress Notes (Signed)
Pt returned my call this morning. I introduced myself and explained my role as a Statistician. I told the pt I had been trying to get a hold of him to schedule and appointment with Dr. [name] and for his Keytruda infusion, which is due on 1/26. I asked the pt if he could come to the clinic at 11:45 on 1/24 for his appointment with Dr.Mohamed and 11:15 for lab work, CBC and CMP. I let the pt know that the clinic can arrange for his infusion appointment and we can take care of that at his appointment with Dr.Mohamed.  I also obtained a good contact phone number for him, as I was unable to reach him at the one in his demographics section.  [phone].  All the patients questions were answered at this time and I told him I would get in touch with him next week to remind him of his appointment.

## 2022-12-02 NOTE — Progress Notes (Signed)
CBC and CMP ordered for next patient appointment on 1/24

## 2022-12-06 ENCOUNTER — Telehealth: Payer: Self-pay

## 2022-12-06 NOTE — Telephone Encounter (Signed)
Patient called to confirm appointment date and time. Appointment date/time was given to patient and he verbalized understanding.

## 2022-12-07 ENCOUNTER — Telehealth (HOSPITAL_COMMUNITY): Payer: Self-pay

## 2022-12-07 ENCOUNTER — Telehealth: Payer: Self-pay | Admitting: Medical Oncology

## 2022-12-07 ENCOUNTER — Telehealth: Payer: Self-pay | Admitting: *Deleted

## 2022-12-07 NOTE — Telephone Encounter (Signed)
PC to patient, informed him that per Dr [name], he does not need to have a CT before being seen for his MD appointment.  Patient informed of lab appointment tomorrow (12/08/22) at 11:15 & MD appointment at 11:45.  Patient verbalizes understanding.

## 2022-12-07 NOTE — Telephone Encounter (Signed)
He did not have his CT scan today in [facility] [place].  Does he need CT scan before appt this week.? Pt notified that he does not need CT scan.

## 2022-12-08 ENCOUNTER — Other Ambulatory Visit: Payer: Self-pay

## 2022-12-08 ENCOUNTER — Inpatient Hospital Stay: Payer: 59 | Attending: Internal Medicine | Admitting: Internal Medicine

## 2022-12-08 ENCOUNTER — Inpatient Hospital Stay: Payer: 59

## 2022-12-08 VITALS — BP 95/61 | HR 80 | Temp 98.4°F | Resp 18 | Ht 60.0 in | Wt 148.6 lb

## 2022-12-08 DIAGNOSIS — Z87891 Personal history of nicotine dependence: Secondary | ICD-10-CM | POA: Diagnosis not present

## 2022-12-08 DIAGNOSIS — C3490 Malignant neoplasm of unspecified part of unspecified bronchus or lung: Secondary | ICD-10-CM

## 2022-12-08 DIAGNOSIS — Z5112 Encounter for antineoplastic immunotherapy: Secondary | ICD-10-CM | POA: Diagnosis present

## 2022-12-08 DIAGNOSIS — C3491 Malignant neoplasm of unspecified part of right bronchus or lung: Secondary | ICD-10-CM | POA: Diagnosis not present

## 2022-12-08 DIAGNOSIS — C3411 Malignant neoplasm of upper lobe, right bronchus or lung: Secondary | ICD-10-CM | POA: Insufficient documentation

## 2022-12-08 DIAGNOSIS — Z79899 Other long term (current) drug therapy: Secondary | ICD-10-CM | POA: Diagnosis not present

## 2022-12-08 DIAGNOSIS — I509 Heart failure, unspecified: Secondary | ICD-10-CM | POA: Diagnosis not present

## 2022-12-08 DIAGNOSIS — C342 Malignant neoplasm of middle lobe, bronchus or lung: Secondary | ICD-10-CM | POA: Insufficient documentation

## 2022-12-08 DIAGNOSIS — I110 Hypertensive heart disease with heart failure: Secondary | ICD-10-CM | POA: Insufficient documentation

## 2022-12-08 LAB — CBC WITH DIFFERENTIAL (CANCER CENTER ONLY)
Abs Immature Granulocytes: 0.03 10*3/uL (ref 0.00–0.07)
Basophils Absolute: 0.1 10*3/uL (ref 0.0–0.1)
Basophils Relative: 1 %
Eosinophils Absolute: 0.3 10*3/uL (ref 0.0–0.5)
Eosinophils Relative: 4 %
HCT: 38.6 % — ABNORMAL LOW (ref 39.0–52.0)
Hemoglobin: 13.4 g/dL (ref 13.0–17.0)
Immature Granulocytes: 1 %
Lymphocytes Relative: 17 %
Lymphs Abs: 1.1 10*3/uL (ref 0.7–4.0)
MCH: 28.9 pg (ref 26.0–34.0)
MCHC: 34.7 g/dL (ref 30.0–36.0)
MCV: 83.4 fL (ref 80.0–100.0)
Monocytes Absolute: 0.8 10*3/uL (ref 0.1–1.0)
Monocytes Relative: 12 %
Neutro Abs: 4.2 10*3/uL (ref 1.7–7.7)
Neutrophils Relative %: 65 %
Platelet Count: 268 10*3/uL (ref 150–400)
RBC: 4.63 MIL/uL (ref 4.22–5.81)
RDW: 17.8 % — ABNORMAL HIGH (ref 11.5–15.5)
WBC Count: 6.4 10*3/uL (ref 4.0–10.5)
nRBC: 0 % (ref 0.0–0.2)

## 2022-12-08 LAB — CMP (CANCER CENTER ONLY)
ALT: 21 U/L (ref 0–44)
AST: 30 U/L (ref 15–41)
Albumin: 3.7 g/dL (ref 3.5–5.0)
Alkaline Phosphatase: 65 U/L (ref 38–126)
Anion gap: 8 (ref 5–15)
BUN: 28 mg/dL — ABNORMAL HIGH (ref 8–23)
CO2: 25 mmol/L (ref 22–32)
Calcium: 9.2 mg/dL (ref 8.9–10.3)
Chloride: 101 mmol/L (ref 98–111)
Creatinine: 1.59 mg/dL — ABNORMAL HIGH (ref 0.61–1.24)
GFR, Estimated: 48 mL/min — ABNORMAL LOW (ref >60)
Glucose, Bld: 74 mg/dL (ref 70–99)
Potassium: 4.5 mmol/L (ref 3.5–5.1)
Sodium: 134 mmol/L — ABNORMAL LOW (ref 135–145)
Total Bilirubin: 0.5 mg/dL (ref 0.3–1.2)
Total Protein: 7.5 g/dL (ref 6.5–8.1)

## 2022-12-08 NOTE — Progress Notes (Signed)
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

## 2022-12-08 NOTE — Progress Notes (Signed)
[place] [phone]   [phone]  CONSULT NOTE  REFERRING PHYSICIAN: Dr. James Ivanoff  REASON FOR CONSULTATION:  67 years old African-American male with recurrent lung cancer.  HPI Ronald [name] is a 67 y.o. male with past medical history significant for congestive heart failure, coronary artery disease status post myocardial infarction, COPD, hypertension as well as history of stage IIIa non-small cell lung cancer diagnosed in July 2004 status post neoadjuvant concurrent chemoradiation followed by consolidation chemotherapy for 2 cycles followed by left pneumonectomy in January 2005 in [place].  The patient was followed by observation since that time by me until 2014 when he was discharged from the practice.  He moved to [facility] and during evaluation for abdominal pain in March 2023 he was found to have spiculated right middle lobe nodule concerning for malignancy with suspicious right hilar adenopathy.  A PET scan was performed on February 18, 2022 and that showed metabolically active right middle lobe and upper lobe nodules concerning for malignancy with intensely avid splenic lesion concerning as well.  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  There was mildly avid right supraclavicular, hilar and subcarinal lymph nodes as well as mildly avid left adrenal nodule and aortocaval node concerning for additional sites.  MRI of the brain on 03/16/2022 was negative for metastatic disease to the brain.  The patient had bronchoscopy with biopsy of the right middle lobe lung nodule and the final pathology [id number] from [facility] showed single group of cohesive atypical epithelial cells suspicious for malignancy.  The patient started a combination of systemic chemotherapy with carboplatin, paclitaxel and Keytruda for 4 cycles.  He mentions  that he was admitted to the hospital almost after every treatment because of toxicity.  He then received maintenance treatment with single agent Keytruda last dose was giving around 4 weeks ago.  He is currently separated from his wife and he is moving back to [place] and came to establish care with me and resume his treatment locally.  He denied having any other significant complaints except for the fatigue and baseline shortness of breath.  He has no cough or hemoptysis.  He has no nausea, vomiting, diarrhea or constipation.  He has no headache but complains of blurry vision. Family history significant for mother with congestive heart failure.  Father had heart disease and brother had lung cancer. The patient is separated and has 3 sons.  He has been on disability since 2004.  He quit smoking in 2004 and also quit drinking around 30 years ago and no history of drug abuse. HPI  Past Medical History:  Diagnosis Date   CHF (congestive heart failure) (HCC)    COPD (chronic obstructive pulmonary disease) ([place])    Coronary artery disease    Hypertension    lung ca dx'd 2005   chemo/xrt comp 2005, lung ca   Myocardial infarction [facility])    Shortness of breath     Past Surgical History:  Procedure Laterality Date   CARDIAC CATHETERIZATION N/A 10/13/2015   Procedure: Left Heart Cath and Coronary Angiography;  Surgeon: [name], MD;  Location: [place] CV LAB;  Service: Cardiovascular;  Laterality: N/A;   CORONARY ANGIOPLASTY WITH STENT PLACEMENT     LEFT HEART CATH AND CORONARY ANGIOGRAPHY N/A 01/03/2018   Procedure: LEFT HEART CATH AND CORONARY ANGIOGRAPHY;  Surgeon: [name], MD;  Location: [place]  CV LAB;  Service: Cardiovascular;  Laterality: N/A;   LEFT HEART CATHETERIZATION WITH CORONARY ANGIOGRAM N/A 04/07/2012   Procedure: LEFT HEART CATHETERIZATION WITH CORONARY ANGIOGRAM;  Surgeon: [name], MD;  Location: [place] CATH LAB;  Service: Cardiovascular;  Laterality: N/A;    PNEUMONECTOMY  2005   left   vocal cord surgery  2005    Family History  Problem Relation Age of Onset   Hypertension Other    Diabetes Other     Social History Social History   Tobacco Use   Smoking status: Former    Types: Cigarettes    Quit date: 09/30/2013    Years since quitting: 9.1   Smokeless tobacco: Never  Substance Use Topics   Alcohol use: No    Comment: former   Drug use: No    Types: Cocaine    Allergies  Allergen Reactions   Penicillins Itching    Current Outpatient Medications  Medication Sig Dispense Refill   albuterol (PROVENTIL HFA;VENTOLIN HFA) 108 (90 BASE) MCG/ACT inhaler Inhale 2 puffs into the lungs every 4 (four) hours as needed for wheezing or shortness of breath. 1 Inhaler 0   aspirin 325 MG tablet Take 325 mg by mouth 2 (two) times a week.     budesonide-formoterol (SYMBICORT) 160-4.5 MCG/ACT inhaler Inhale 2 puffs into the lungs 2 (two) times daily.     carvedilol (COREG) 3.125 MG tablet Take 1 tablet (3.125 mg total) by mouth 2 (two) times daily with a meal. 60 tablet 0   furosemide (LASIX) 40 MG tablet Take 1 tablet (40 mg total) by mouth 2 (two) times daily. Twice a day for 3 days, then back to normal dosing. (Patient taking differently: Take 40 mg by mouth daily as needed for fluid. ) 3 tablet 0   guaiFENesin (MUCINEX) 600 MG 12 hr tablet Take 2 tablets (1,200 mg total) by mouth 2 (two) times daily. (Patient taking differently: Take 1,200 mg by mouth 2 (two) times daily as needed for cough or to loosen phlegm. ) 60 tablet 0   meloxicam (MOBIC) 7.5 MG tablet Take 7.5 mg by mouth daily.     montelukast (SINGULAIR) 10 MG tablet Take 1 tablet (10 mg total) by mouth at bedtime.     nitroGLYCERIN (NITROSTAT) 0.4 MG SL tablet Place 1 tablet (0.4 mg total) under the tongue every 5 (five) minutes x 3 doses as needed for chest pain. 25 tablet 12   ondansetron (ZOFRAN ODT) 4 MG disintegrating tablet Take 1 tablet (4 mg total) by mouth every 8 (eight)  hours as needed for nausea or vomiting. 20 tablet 0   polyethylene glycol (MIRALAX) packet Take 17 g by mouth daily as needed for moderate constipation or severe constipation. 30 each 0   prasugrel (EFFIENT) 10 MG TABS tablet Take 0.5 tablets (5 mg total) by mouth daily.     ramipril (ALTACE) 1.25 MG capsule Take 1 capsule (1.25 mg total) by mouth daily. 30 capsule 0   spironolactone (ALDACTONE) 25 MG tablet Take 1 tablet (25 mg total) by mouth daily. 30 tablet 0   tiZANidine (ZANAFLEX) 2 MG tablet Take by mouth every 6 (six) hours as needed for muscle spasms.     traMADol (ULTRAM) 50 MG tablet Take 1 tablet (50 mg total) by mouth every 12 (twelve) hours as needed for severe pain. 15 tablet 0   No current facility-administered medications for this visit.    Review of Systems  Constitutional: positive for fatigue Eyes: negative Ears,  nose, mouth, throat, and face: negative Respiratory: positive for cough and dyspnea on exertion Cardiovascular: negative Gastrointestinal: negative Genitourinary:negative Integument/breast: negative Hematologic/lymphatic: negative Musculoskeletal:negative Neurological: negative Behavioral/Psych: negative Endocrine: negative Allergic/Immunologic: negative  Physical Exam  HWE:XHBZJ, healthy, no distress, well nourished, and well developed SKIN: skin color, texture, turgor are normal, no rashes or significant lesions HEAD: Normocephalic, No masses, lesions, tenderness or abnormalities EYES: normal, PERRLA, Conjunctiva are pink and non-injected EARS: External ears normal, Canals clear OROPHARYNX:no exudate, no erythema, and lips, buccal mucosa, and tongue normal  NECK: supple, no adenopathy, no JVD LYMPH:  no palpable lymphadenopathy, no hepatosplenomegaly LUNGS: decreased breath sounds HEART: regular rate & rhythm, no murmurs, and no gallops ABDOMEN:abdomen soft, non-tender, normal bowel sounds, and no masses or organomegaly BACK: Back symmetric, no  curvature., No CVA tenderness EXTREMITIES:no joint deformities, effusion, or inflammation, no edema  NEURO: alert & oriented x 3 with fluent speech, no focal motor/sensory deficits  PERFORMANCE STATUS: ECOG 1  LABORATORY DATA: Lab Results  Component Value Date   WBC 6.4 12/08/2022   HGB 13.4 12/08/2022   HCT 38.6 (L) 12/08/2022   MCV 83.4 12/08/2022   PLT 268 12/08/2022      Chemistry      Component Value Date/Time   NA 134 (L) 12/08/2022 1115   NA 133 (L) 12/14/2012 0942   K 4.5 12/08/2022 1115   K 4.5 12/14/2012 0942   CL 101 12/08/2022 1115   CL 101 12/14/2012 0942   CO2 25 12/08/2022 1115   CO2 24 12/14/2012 0942   BUN 28 (H) 12/08/2022 1115   BUN 21.8 12/14/2012 0942   CREATININE 1.59 (H) 12/08/2022 1115   CREATININE 1.0 12/14/2012 0942      Component Value Date/Time   CALCIUM 9.2 12/08/2022 1115   CALCIUM 9.0 12/14/2012 0942   ALKPHOS 65 12/08/2022 1115   ALKPHOS 108 12/14/2012 0942   AST 30 12/08/2022 1115   AST 31 12/14/2012 0942   ALT 21 12/08/2022 1115   ALT 42 12/14/2012 0942   BILITOT 0.5 12/08/2022 1115   BILITOT 0.63 12/14/2012 0942       RADIOGRAPHIC STUDIES: No results found.  ASSESSMENT: This is a very pleasant 67 years old African-American male with recently diagnosed stage IV (T4, N3, M1 C) non-small cell lung cancer and March 2023 presented with right middle lobe and right upper lobe pulmonary nodules in addition to mediastinal and supraclavicular lymphadenopathy as well as suspicious splenic metastasis.  The patient has a history for stage IIIa non-small cell lung cancer diagnosed in 2004 status post neoadjuvant concurrent chemoradiation followed by consolidation chemotherapy followed by left pneumonectomy and has been in observation for almost 19 years before he has the new diagnosis of lung cancer in 2023.   PLAN: I had a lengthy discussion with the patient today about his current disease stage, prognosis and treatment options.  The patient  started his systemic chemotherapy with carboplatin, paclitaxel and Keytruda at [facility] in [place] status post 4 cycles of the combination chemotherapy and immunotherapy followed by maintenance treatment with [facility] every 3 weeks.  The last dose was given more than 3 weeks ago.  The patient is currently moving to [facility]. I recommended for the patient to have repeat CT scan of the chest, abdomen and pelvis for restaging of his disease before resuming his maintenance treatment with Keytruda. I will arrange for the patient to start the next cycle of his treatment with immunotherapy next week with  Keytruda 200 Mg IV every 3 weeks. The patient also has congestive heart failure and he has an appointment with a cardiologist in [place]. The patient will come back for follow-up visit at that time. He was advised to call immediately if he has any other concerning symptoms in the interval.  The patient voices understanding of current disease status and treatment options and is in agreement with the current care plan.  All questions were answered. The patient knows to call the clinic with any problems, questions or concerns. We can certainly see the patient much sooner if necessary.  Thank you so much for allowing me to participate in the care of OSAMA COLESON. I will continue to follow up the patient with you and assist in his care.  The total time spent in the appointment was 60 minutes.  Disclaimer: This note was dictated with voice recognition software. Similar sounding words can inadvertently be transcribed and may not be corrected upon review.   Eilleen Kempf December 08, 2022, 12:00 PM

## 2022-12-09 ENCOUNTER — Other Ambulatory Visit: Payer: Self-pay

## 2022-12-09 ENCOUNTER — Telehealth: Payer: Self-pay | Admitting: Internal Medicine

## 2022-12-09 NOTE — Telephone Encounter (Signed)
Scheduled per 01/24 los and work-queue, patient has been called and notified of upcoming appointments.

## 2022-12-10 ENCOUNTER — Other Ambulatory Visit: Payer: Self-pay

## 2022-12-10 ENCOUNTER — Inpatient Hospital Stay: Payer: 59

## 2022-12-10 DIAGNOSIS — C3490 Malignant neoplasm of unspecified part of unspecified bronchus or lung: Secondary | ICD-10-CM

## 2022-12-10 MED ORDER — LIDOCAINE-PRILOCAINE 2.5-2.5 % EX CREA: 1.0000 | TOPICAL_CREAM | CUTANEOUS | 2 refills | Status: DC | PRN

## 2022-12-14 ENCOUNTER — Other Ambulatory Visit: Payer: Self-pay

## 2022-12-14 ENCOUNTER — Ambulatory Visit (HOSPITAL_COMMUNITY): Admission: RE | Admit: 2022-12-14 | Payer: 59 | Source: Ambulatory Visit

## 2022-12-15 ENCOUNTER — Inpatient Hospital Stay: Payer: 59

## 2022-12-15 ENCOUNTER — Inpatient Hospital Stay (HOSPITAL_BASED_OUTPATIENT_CLINIC_OR_DEPARTMENT_OTHER): Payer: 59 | Admitting: Internal Medicine

## 2022-12-15 ENCOUNTER — Other Ambulatory Visit: Payer: Self-pay

## 2022-12-15 VITALS — BP 101/61 | HR 75 | Resp 16

## 2022-12-15 DIAGNOSIS — C3490 Malignant neoplasm of unspecified part of unspecified bronchus or lung: Secondary | ICD-10-CM

## 2022-12-15 DIAGNOSIS — Z5112 Encounter for antineoplastic immunotherapy: Secondary | ICD-10-CM | POA: Diagnosis not present

## 2022-12-15 DIAGNOSIS — Z95828 Presence of other vascular implants and grafts: Secondary | ICD-10-CM | POA: Insufficient documentation

## 2022-12-15 LAB — CMP (CANCER CENTER ONLY)
ALT: 19 U/L (ref 0–44)
AST: 29 U/L (ref 15–41)
Albumin: 3.7 g/dL (ref 3.5–5.0)
Alkaline Phosphatase: 60 U/L (ref 38–126)
Anion gap: 7 (ref 5–15)
BUN: 39 mg/dL — ABNORMAL HIGH (ref 8–23)
CO2: 27 mmol/L (ref 22–32)
Calcium: 8.8 mg/dL — ABNORMAL LOW (ref 8.9–10.3)
Chloride: 103 mmol/L (ref 98–111)
Creatinine: 1.69 mg/dL — ABNORMAL HIGH (ref 0.61–1.24)
GFR, Estimated: 44 mL/min — ABNORMAL LOW (ref >60)
Glucose, Bld: 80 mg/dL (ref 70–99)
Potassium: 4.1 mmol/L (ref 3.5–5.1)
Sodium: 137 mmol/L (ref 135–145)
Total Bilirubin: 0.7 mg/dL (ref 0.3–1.2)
Total Protein: 7.3 g/dL (ref 6.5–8.1)

## 2022-12-15 LAB — CBC WITH DIFFERENTIAL (CANCER CENTER ONLY)
Abs Immature Granulocytes: 0.01 10*3/uL (ref 0.00–0.07)
Basophils Absolute: 0.1 10*3/uL (ref 0.0–0.1)
Basophils Relative: 1 %
Eosinophils Absolute: 0.3 10*3/uL (ref 0.0–0.5)
Eosinophils Relative: 5 %
HCT: 36.8 % — ABNORMAL LOW (ref 39.0–52.0)
Hemoglobin: 12.9 g/dL — ABNORMAL LOW (ref 13.0–17.0)
Immature Granulocytes: 0 %
Lymphocytes Relative: 17 %
Lymphs Abs: 1 10*3/uL (ref 0.7–4.0)
MCH: 29.4 pg (ref 26.0–34.0)
MCHC: 35.1 g/dL (ref 30.0–36.0)
MCV: 83.8 fL (ref 80.0–100.0)
Monocytes Absolute: 0.8 10*3/uL (ref 0.1–1.0)
Monocytes Relative: 13 %
Neutro Abs: 3.7 10*3/uL (ref 1.7–7.7)
Neutrophils Relative %: 64 %
Platelet Count: 257 10*3/uL (ref 150–400)
RBC: 4.39 MIL/uL (ref 4.22–5.81)
RDW: 17.9 % — ABNORMAL HIGH (ref 11.5–15.5)
WBC Count: 5.8 10*3/uL (ref 4.0–10.5)
nRBC: 0 % (ref 0.0–0.2)

## 2022-12-15 LAB — TSH: TSH: 102.371 u[IU]/mL — ABNORMAL HIGH (ref 0.350–4.500)

## 2022-12-15 MED ORDER — SODIUM CHLORIDE 0.9 % IV SOLN: Freq: Once | INTRAVENOUS | Status: AC

## 2022-12-15 MED ORDER — SODIUM CHLORIDE 0.9% FLUSH
10.0000 mL | Freq: Once | INTRAVENOUS | Status: AC
  Administered 2022-12-15: 10 mL

## 2022-12-15 MED ORDER — SODIUM CHLORIDE 0.9 % IV SOLN
200.0000 mg | Freq: Once | INTRAVENOUS | Status: AC
  Administered 2022-12-15: 200 mg via INTRAVENOUS
  Filled 2022-12-15: qty 8

## 2022-12-15 MED ORDER — SODIUM CHLORIDE 0.9% FLUSH
10.0000 mL | INTRAVENOUS | Status: DC | PRN
  Administered 2022-12-15: 10 mL

## 2022-12-15 MED ORDER — HEPARIN SOD (PORK) LOCK FLUSH 100 UNIT/ML IV SOLN
500.0000 [IU] | Freq: Once | INTRAVENOUS | Status: AC | PRN
  Administered 2022-12-15: 500 [IU]

## 2022-12-15 NOTE — Progress Notes (Signed)
[place] [phone]   [phone]  OFFICE PROGRESS NOTE  Ronald Ebbs, MD [address] [place] [address]  DIAGNOSIS: 1)  Stage IV (T4, N3, M1 C) non-small cell lung cancer and March 2023 presented with right middle lobe and right upper lobe pulmonary nodules in addition to mediastinal and supraclavicular lymphadenopathy as well as suspicious splenic metastasis.  2) The patient has a history for stage IIIa non-small cell lung cancer diagnosed in 2004 status post neoadjuvant concurrent chemoradiation followed by consolidation chemotherapy followed by left pneumonectomy and has been in observation for almost 19 years before he has the new diagnosis of lung cancer in 2023.   PRIOR THERAPY: Status post 4 cycles of systemic chemotherapy with carboplatin, paclitaxel and Keytruda at [facility] in [place].  CURRENT THERAPY: Maintenance treatment with Keytruda 200 Mg IV every 3 weeks status post 3 cycles.  INTERVAL HISTORY: Ronald [name] 67 y.o. male returns to the clinic today for follow-up visit.  The patient is feeling fine today with no concerning complaints except for the baseline shortness of breath.  He denied having any chest pain, cough or hemoptysis.  He has no nausea, vomiting, diarrhea or constipation.  He has no headache or visual changes.  He was supposed to have repeat CT scan of the chest, abdomen and pelvis before this visit but unfortunately he did not have the scan done.  He is here today to resume his treatment with immunotherapy with Keytruda.  MEDICAL HISTORY: Past Medical History:  Diagnosis Date   CHF (congestive heart failure) (HCC)    COPD (chronic obstructive pulmonary disease) ([place])    Coronary artery disease    Hypertension    lung ca dx'd 2005   chemo/xrt comp 2005, lung ca   Myocardial infarction (HCC)    Shortness of breath     ALLERGIES:  is allergic to  penicillins.  MEDICATIONS:  Current Outpatient Medications  Medication Sig Dispense Refill   albuterol (PROVENTIL HFA;VENTOLIN HFA) 108 (90 BASE) MCG/ACT inhaler Inhale 2 puffs into the lungs every 4 (four) hours as needed for wheezing or shortness of breath. 1 Inhaler 0   aspirin 325 MG tablet Take 325 mg by mouth 2 (two) times a week.     budesonide-formoterol (SYMBICORT) 160-4.5 MCG/ACT inhaler Inhale 2 puffs into the lungs 2 (two) times daily.     carvedilol (COREG) 3.125 MG tablet Take 1 tablet (3.125 mg total) by mouth 2 (two) times daily with a meal. 60 tablet 0   Fluticasone-Umeclidin-Vilant (TRELEGY ELLIPTA IN) Inhale into the lungs.     furosemide (LASIX) 40 MG tablet Take 1 tablet (40 mg total) by mouth 2 (two) times daily. Twice a day for 3 days, then back to normal dosing. (Patient taking differently: Take 40 mg by mouth daily as needed for fluid. ) 3 tablet 0   guaiFENesin (MUCINEX) 600 MG 12 hr tablet Take 2 tablets (1,200 mg total) by mouth 2 (two) times daily. (Patient taking differently: Take 1,200 mg by mouth 2 (two) times daily as needed for cough or to loosen phlegm. ) 60 tablet 0   lidocaine-prilocaine (EMLA) cream Apply 1 Application topically as needed (To Port-a-Cath). 30 g 2   meloxicam (MOBIC) 7.5 MG tablet Take 7.5 mg by mouth daily.     montelukast (SINGULAIR) 10 MG tablet Take 1 tablet (10 mg total) by mouth at bedtime.     nitroGLYCERIN (NITROSTAT) 0.4 MG SL  tablet Place 1 tablet (0.4 mg total) under the tongue every 5 (five) minutes x 3 doses as needed for chest pain. 25 tablet 12   ondansetron (ZOFRAN ODT) 4 MG disintegrating tablet Take 1 tablet (4 mg total) by mouth every 8 (eight) hours as needed for nausea or vomiting. 20 tablet 0   polyethylene glycol (MIRALAX) packet Take 17 g by mouth daily as needed for moderate constipation or severe constipation. 30 each 0   prasugrel (EFFIENT) 10 MG TABS tablet Take 0.5 tablets (5 mg total) by mouth daily.     ramipril  (ALTACE) 1.25 MG capsule Take 1 capsule (1.25 mg total) by mouth daily. 30 capsule 0   spironolactone (ALDACTONE) 25 MG tablet Take 1 tablet (25 mg total) by mouth daily. 30 tablet 0   tiZANidine (ZANAFLEX) 2 MG tablet Take by mouth every 6 (six) hours as needed for muscle spasms.     traMADol (ULTRAM) 50 MG tablet Take 1 tablet (50 mg total) by mouth every 12 (twelve) hours as needed for severe pain. 15 tablet 0   No current facility-administered medications for this visit.    SURGICAL HISTORY:  Past Surgical History:  Procedure Laterality Date   CARDIAC CATHETERIZATION N/A 10/13/2015   Procedure: Left Heart Cath and Coronary Angiography;  Surgeon: [name], MD;  Location: [place] CV [facility];  Service: Cardiovascular;  Laterality: N/A;   CORONARY ANGIOPLASTY WITH STENT PLACEMENT     LEFT HEART CATH AND CORONARY ANGIOGRAPHY N/A 01/03/2018   Procedure: LEFT HEART CATH AND CORONARY ANGIOGRAPHY;  Surgeon: [name], MD;  Location: [place] [facility];  Service: Cardiovascular;  Laterality: N/A;   LEFT HEART CATHETERIZATION WITH CORONARY ANGIOGRAM N/A 04/07/2012   Procedure: LEFT HEART CATHETERIZATION WITH CORONARY ANGIOGRAM;  Surgeon: [name], MD;  Location: [place] CATH LAB;  Service: Cardiovascular;  Laterality: N/A;   PNEUMONECTOMY  2005   left   vocal cord surgery  2005    REVIEW OF SYSTEMS:  A comprehensive review of systems was negative except for: Constitutional: positive for fatigue Respiratory: positive for dyspnea on exertion   PHYSICAL EXAMINATION: General appearance: alert, cooperative, fatigued, and no distress Head: Normocephalic, without obvious abnormality, atraumatic Neck: no adenopathy, no JVD, supple, symmetrical, trachea midline, and thyroid not enlarged, symmetric, no tenderness/mass/nodules Lymph nodes: Cervical, supraclavicular, and axillary nodes normal. Resp: diminished breath sounds LLL and LUL and dullness to percussion LLL and LUL Back: symmetric, no  curvature. ROM normal. No CVA tenderness. Cardio: regular rate and rhythm, S1, S2 normal, no murmur, click, rub or gallop GI: soft, non-tender; bowel sounds normal; no masses,  no organomegaly Extremities: extremities normal, atraumatic, no cyanosis or edema  ECOG PERFORMANCE STATUS: 1 - Symptomatic but completely ambulatory  Blood pressure 110/66, pulse 97, temperature 97.8 F (36.6 C), temperature source Tympanic, resp. rate 17, height 5' (1.524 m), weight 147 lb 4.8 oz (66.8 kg), SpO2 95 %.  LABORATORY DATA: Lab Results  Component Value Date   WBC 5.8 12/15/2022   HGB 12.9 (L) 12/15/2022   HCT 36.8 (L) 12/15/2022   MCV 83.8 12/15/2022   PLT 257 12/15/2022      Chemistry      Component Value Date/Time   NA 137 12/15/2022 1026   NA 133 (L) 12/14/2012 0942   K 4.1 12/15/2022 1026   K 4.5 12/14/2012 0942   CL 103 12/15/2022 1026   CL 101 12/14/2012 0942   CO2 27 12/15/2022 1026   CO2 24 12/14/2012 0942  BUN 39 (H) 12/15/2022 1026   BUN 21.8 12/14/2012 0942   CREATININE 1.69 (H) 12/15/2022 1026   CREATININE 1.0 12/14/2012 0942      Component Value Date/Time   CALCIUM 8.8 (L) 12/15/2022 1026   CALCIUM 9.0 12/14/2012 0942   ALKPHOS 60 12/15/2022 1026   ALKPHOS 108 12/14/2012 0942   AST 29 12/15/2022 1026   AST 31 12/14/2012 0942   ALT 19 12/15/2022 1026   ALT 42 12/14/2012 0942   BILITOT 0.7 12/15/2022 1026   BILITOT 0.63 12/14/2012 0942       RADIOGRAPHIC STUDIES: No results found.  ASSESSMENT AND PLAN: This is a very pleasant 67 years old African-American male with stage IV (T4, N3, M1 C) non-small cell lung cancer and March 2023 presented with right middle lobe and right upper lobe pulmonary nodules in addition to mediastinal and supraclavicular lymphadenopathy as well as suspicious splenic metastasis. The patient has a history for stage IIIa non-small cell lung cancer diagnosed in 2004 status post neoadjuvant concurrent chemoradiation followed by consolidation  chemotherapy followed by left pneumonectomy and has been in observation for almost 19 years before he has the new diagnosis of lung cancer in 2023.  The patient started systemic chemotherapy with carboplatin, paclitaxel and Keytruda for 4 cycles before switching to single agent Keytruda. He is tolerating the maintenance treatment with single agent Keytruda fairly well. I recommended for the patient to proceed with his treatment today as planned. I will see him back for follow-up visit in 3 weeks for evaluation with repeat CT scan of the chest, abdomen and pelvis for restaging of his disease. The patient voices understanding of current disease status and treatment options and is in agreement with the current care plan.  All questions were answered. The patient knows to call the clinic with any problems, questions or concerns. We can certainly see the patient much sooner if necessary.  The total time spent in the appointment was 20 minutes.  Disclaimer: This note was dictated with voice recognition software. Similar sounding words can inadvertently be transcribed and may not be corrected upon review.

## 2022-12-15 NOTE — Progress Notes (Signed)
Per Curt Bears, MD okay to treat with Creatinine 1.69.

## 2022-12-15 NOTE — Patient Instructions (Signed)
Ronald [name]  Discharge Instructions: Thank you for choosing [facility] to provide your oncology and hematology care.   If you have a lab appointment with the [facility], please go directly to the [facility] and check in at the registration area.   Wear comfortable clothing and clothing appropriate for easy access to any Portacath or PICC line.   We strive to give you quality time with your provider. You may need to reschedule your appointment if you arrive late (15 or more minutes).  Arriving late affects you and other patients whose appointments are after yours.  Also, if you miss three or more appointments without notifying the office, you may be dismissed from the clinic at the provider's discretion.      For prescription refill requests, have your pharmacy contact our office and allow 72 hours for refills to be completed.    Today you received the following chemotherapy and/or immunotherapy agents Keytruda      To help prevent nausea and vomiting after your treatment, we encourage you to take your nausea medication as directed.  BELOW ARE SYMPTOMS THAT SHOULD BE REPORTED IMMEDIATELY: *FEVER GREATER THAN 100.4 F (38 C) OR HIGHER *CHILLS OR SWEATING *NAUSEA AND VOMITING THAT IS NOT CONTROLLED WITH YOUR NAUSEA MEDICATION *UNUSUAL SHORTNESS OF BREATH *UNUSUAL BRUISING OR BLEEDING *URINARY PROBLEMS (pain or burning when urinating, or frequent urination) *BOWEL PROBLEMS (unusual diarrhea, constipation, pain near the anus) TENDERNESS IN MOUTH AND THROAT WITH OR WITHOUT PRESENCE OF ULCERS (sore throat, sores in mouth, or a toothache) UNUSUAL RASH, SWELLING OR PAIN  UNUSUAL VAGINAL DISCHARGE OR ITCHING   Items with * indicate a potential emergency and should be followed up as soon as possible or go to the Emergency Department if any problems should occur.  Please show the CHEMOTHERAPY ALERT CARD or IMMUNOTHERAPY ALERT CARD at  check-in to the Emergency Department and triage nurse.  Should you have questions after your visit or need to cancel or reschedule your appointment, please contact [facility]  Dept: [phone]  and follow the prompts.  Office hours are 8:00 a.m. to 4:30 p.m. Monday - Friday. Please note that voicemails left after 4:00 p.m. may not be returned until the following business day.  We are closed weekends and major holidays. You have access to a nurse at all times for urgent questions. Please call the main number to the clinic Dept: [phone] and follow the prompts.   For any non-urgent questions, you may also contact your provider using MyChart. We now offer e-Visits for anyone 86 and older to request care online for non-urgent symptoms. For details visit [email or web address].   Also download the MyChart app! Go to the app store, search "MyChart", open the app, select [facility], and log in with your MyChart username and password.   Pembrolizumab (Keytruda) Injection What is this medication? PEMBROLIZUMAB (PEM broe LIZ ue mab) treats some types of cancer. It works by helping your immune system slow or stop the spread of cancer cells. It is a monoclonal antibody. This medicine may be used for other purposes; ask your health care provider or pharmacist if you have questions. COMMON BRAND NAME(S): Keytruda What should I tell my care team before I take this medication? They need to know if you have any of these conditions: Allogeneic stem cell transplant (uses someone else's stem cells) Autoimmune diseases, such as Crohn disease, ulcerative colitis, lupus History of chest  radiation Nervous system problems, such as Guillain-Barre syndrome, myasthenia gravis Organ transplant An unusual or allergic reaction to pembrolizumab, other medications, foods, dyes, or preservatives Pregnant or trying to get pregnant Breast-feeding How should I use this  medication? This medication is injected into a vein. It is given by your care team in a hospital or clinic setting. A special MedGuide will be given to you before each treatment. Be sure to read this information carefully each time. Talk to your care team about the use of this medication in children. While it may be prescribed for children as young as 6 months for selected conditions, precautions do apply. Overdosage: If you think you have taken too much of this medicine contact a poison control center or emergency room at once. NOTE: This medicine is only for you. Do not share this medicine with others. What if I miss a dose? Keep appointments for follow-up doses. It is important not to miss your dose. Call your care team if you are unable to keep an appointment. What may interact with this medication? Interactions have not been studied. This list may not describe all possible interactions. Give your health care provider a list of all the medicines, herbs, non-prescription drugs, or dietary supplements you use. Also tell them if you smoke, drink alcohol, or use illegal drugs. Some items may interact with your medicine. What should I watch for while using this medication? Your condition will be monitored carefully while you are receiving this medication. You may need blood work while taking this medication. This medication may cause serious skin reactions. They can happen weeks to months after starting the medication. Contact your care team right away if you notice fevers or flu-like symptoms with a rash. The rash may be red or purple and then turn into blisters or peeling of the skin. You may also notice a red rash with swelling of the face, lips, or lymph nodes in your neck or under your arms. Tell your care team right away if you have any change in your eyesight. Talk to your care team if you may be pregnant. Serious birth defects can occur if you take this medication during pregnancy and for 4  months after the last dose. You will need a negative pregnancy test before starting this medication. Contraception is recommended while taking this medication and for 4 months after the last dose. Your care team can help you find the option that works for you. Do not breastfeed while taking this medication and for 4 months after the last dose. What side effects may I notice from receiving this medication? Side effects that you should report to your care team as soon as possible: Allergic reactions--skin rash, itching, hives, swelling of the face, lips, tongue, or throat Dry cough, shortness of breath or trouble breathing Eye pain, redness, irritation, or discharge with blurry or decreased vision Heart muscle inflammation--unusual weakness or fatigue, shortness of breath, chest pain, fast or irregular heartbeat, dizziness, swelling of the ankles, feet, or hands Hormone gland problems--headache, sensitivity to light, unusual weakness or fatigue, dizziness, fast or irregular heartbeat, increased sensitivity to cold or heat, excessive sweating, constipation, hair loss, increased thirst or amount of urine, tremors or shaking, irritability Infusion reactions--chest pain, shortness of breath or trouble breathing, feeling faint or lightheaded Kidney injury (glomerulonephritis)--decrease in the amount of urine, red or dark brown urine, foamy or bubbly urine, swelling of the ankles, hands, or feet Liver injury--right upper belly pain, loss of appetite, nausea, light-colored stool, dark  yellow or brown urine, yellowing skin or eyes, unusual weakness or fatigue Pain, tingling, or numbness in the hands or feet, muscle weakness, change in vision, confusion or trouble speaking, loss of balance or coordination, trouble walking, seizures Rash, fever, and swollen lymph nodes Redness, blistering, peeling, or loosening of the skin, including inside the mouth Sudden or severe stomach pain, bloody diarrhea, fever, nausea,  vomiting Side effects that usually do not require medical attention (report to your care team if they continue or are bothersome): Bone, joint, or muscle pain Diarrhea Fatigue Loss of appetite Nausea Skin rash This list may not describe all possible side effects. Call your doctor for medical advice about side effects. You may report side effects to FDA at 1-800-FDA-1088. Where should I keep my medication? This medication is given in a hospital or clinic. It will not be stored at home. NOTE: This sheet is a summary. It may not cover all possible information. If you have questions about this medicine, talk to your doctor, pharmacist, or health care provider.  2023 Elsevier/Gold Standard (2013-07-23 00:00:00)

## 2022-12-16 ENCOUNTER — Ambulatory Visit (HOSPITAL_COMMUNITY)
Admission: RE | Admit: 2022-12-16 | Discharge: 2022-12-16 | Disposition: A | Discharge status: Home / Self Care | Payer: 59 | Source: Ambulatory Visit | Attending: Internal Medicine | Admitting: Internal Medicine

## 2022-12-16 ENCOUNTER — Telehealth: Payer: Self-pay | Admitting: Medical Oncology

## 2022-12-16 ENCOUNTER — Other Ambulatory Visit: Payer: Self-pay | Admitting: Internal Medicine

## 2022-12-16 DIAGNOSIS — C3490 Malignant neoplasm of unspecified part of unspecified bronchus or lung: Secondary | ICD-10-CM | POA: Diagnosis present

## 2022-12-16 LAB — T4: T4, Total: 2.2 ug/dL — ABNORMAL LOW (ref 4.5–12.0)

## 2022-12-16 MED ORDER — LEVOTHYROXINE SODIUM 50 MCG PO TABS: 50.0000 ug | ORAL_TABLET | Freq: Every day | ORAL | 2 refills | Status: DC

## 2022-12-16 NOTE — Telephone Encounter (Signed)
"  Please let him know that his TSH is very high and I will send the prescription of levothyroxine to his pharmacy.  Thank you "  Unable to reach pt -phone not in service. His brother , Ronald [name] was notified and will tell Ronald [name] to pick up a prescription for his thyroid.

## 2022-12-16 NOTE — Telephone Encounter (Signed)
Returned pt call -no answer.

## 2022-12-17 ENCOUNTER — Other Ambulatory Visit: Payer: Self-pay

## 2022-12-25 ENCOUNTER — Encounter (HOSPITAL_COMMUNITY): Payer: Self-pay

## 2022-12-25 ENCOUNTER — Emergency Department (HOSPITAL_COMMUNITY)
Admission: EM | Admit: 2022-12-25 | Discharge: 2022-12-25 | Disposition: A | Discharge status: Home / Self Care | Payer: 59 | Attending: Emergency Medicine | Admitting: Emergency Medicine

## 2022-12-25 DIAGNOSIS — Z79899 Other long term (current) drug therapy: Secondary | ICD-10-CM | POA: Diagnosis not present

## 2022-12-25 DIAGNOSIS — R251 Tremor, unspecified: Secondary | ICD-10-CM | POA: Insufficient documentation

## 2022-12-25 DIAGNOSIS — Z7982 Long term (current) use of aspirin: Secondary | ICD-10-CM | POA: Diagnosis not present

## 2022-12-25 DIAGNOSIS — R946 Abnormal results of thyroid function studies: Secondary | ICD-10-CM | POA: Insufficient documentation

## 2022-12-25 LAB — CBC WITH DIFFERENTIAL/PLATELET
Abs Immature Granulocytes: 0.01 10*3/uL (ref 0.00–0.07)
Basophils Absolute: 0.1 10*3/uL (ref 0.0–0.1)
Basophils Relative: 1 %
Eosinophils Absolute: 0.3 10*3/uL (ref 0.0–0.5)
Eosinophils Relative: 6 %
HCT: 37.6 % — ABNORMAL LOW (ref 39.0–52.0)
Hemoglobin: 12.6 g/dL — ABNORMAL LOW (ref 13.0–17.0)
Immature Granulocytes: 0 %
Lymphocytes Relative: 17 %
Lymphs Abs: 1 10*3/uL (ref 0.7–4.0)
MCH: 29.1 pg (ref 26.0–34.0)
MCHC: 33.5 g/dL (ref 30.0–36.0)
MCV: 86.8 fL (ref 80.0–100.0)
Monocytes Absolute: 0.5 10*3/uL (ref 0.1–1.0)
Monocytes Relative: 9 %
Neutro Abs: 3.8 10*3/uL (ref 1.7–7.7)
Neutrophils Relative %: 67 %
Platelets: 257 10*3/uL (ref 150–400)
RBC: 4.33 MIL/uL (ref 4.22–5.81)
RDW: 18.3 % — ABNORMAL HIGH (ref 11.5–15.5)
WBC: 5.7 10*3/uL (ref 4.0–10.5)
nRBC: 0 % (ref 0.0–0.2)

## 2022-12-25 LAB — BASIC METABOLIC PANEL
Anion gap: 12 (ref 5–15)
BUN: 41 mg/dL — ABNORMAL HIGH (ref 8–23)
CO2: 22 mmol/L (ref 22–32)
Calcium: 8.6 mg/dL — ABNORMAL LOW (ref 8.9–10.3)
Chloride: 100 mmol/L (ref 98–111)
Creatinine, Ser: 1.64 mg/dL — ABNORMAL HIGH (ref 0.61–1.24)
GFR, Estimated: 46 mL/min — ABNORMAL LOW (ref >60)
Glucose, Bld: 113 mg/dL — ABNORMAL HIGH (ref 70–99)
Potassium: 4.3 mmol/L (ref 3.5–5.1)
Sodium: 134 mmol/L — ABNORMAL LOW (ref 135–145)

## 2022-12-25 MED ORDER — LORAZEPAM 2 MG/ML IJ SOLN
0.5000 mg | Freq: Once | INTRAMUSCULAR | Status: AC
  Administered 2022-12-25: 0.5 mg via INTRAVENOUS
  Filled 2022-12-25: qty 1

## 2022-12-25 NOTE — ED Provider Notes (Signed)
[facility] AT [facility] Provider Note   CSN: [id number] Arrival date & time: 12/25/22  1013     History  Chief Complaint  Patient presents with   Tremors    Ronald [name] is a 67 y.o. male.  67 year old male with prior medical history as detailed below presents for evaluation.  Patient complains of feeling "shaky all over".  This began approximately 2 days ago.  Patient associates his symptoms with initiation of use of levothyroxine.  He was told to start levothyroxine secondary to elevated TSH.  He denies associated chest pain, fever, nausea, vomiting, other complaint.  The history is provided by the patient and medical records.       Home Medications Prior to Admission medications   Medication Sig Start Date End Date Taking? Authorizing Provider  albuterol (PROVENTIL HFA;VENTOLIN HFA) 108 (90 BASE) MCG/ACT inhaler Inhale 2 puffs into the lungs every 4 (four) hours as needed for wheezing or shortness of breath. 10/14/13   Ward, Delice Bison, DO  aspirin 325 MG tablet Take 325 mg by mouth 2 (two) times a week.    [provider]  budesonide-formoterol (SYMBICORT) 160-4.5 MCG/ACT inhaler Inhale 2 puffs into the lungs 2 (two) times daily.    [provider]  carvedilol (COREG) 3.125 MG tablet Take 1 tablet (3.125 mg total) by mouth 2 (two) times daily with a meal. 07/06/15   Tat, Shanon Brow, MD  Fluticasone-Umeclidin-Vilant (TRELEGY ELLIPTA IN) Inhale into the lungs.    [provider]  furosemide (LASIX) 40 MG tablet Take 1 tablet (40 mg total) by mouth 2 (two) times daily. Twice a day for 3 days, then back to normal dosing. Patient taking differently: Take 40 mg by mouth daily as needed for fluid.  05/02/15   Withrow, Elyse Jarvis, FNP  guaiFENesin (MUCINEX) 600 MG 12 hr tablet Take 2 tablets (1,200 mg total) by mouth 2 (two) times daily. Patient taking differently: Take 1,200 mg by mouth 2 (two) times daily as needed for cough or  to loosen phlegm.  01/31/17   Rai, Vernelle Emerald, MD  levothyroxine (SYNTHROID) 50 MCG tablet Take 1 tablet (50 mcg total) by mouth daily before breakfast. 12/16/22   Curt Bears, MD  lidocaine-prilocaine (EMLA) cream Apply 1 Application topically as needed (To Port-a-Cath). 12/10/22   Curt Bears, MD  meloxicam (MOBIC) 7.5 MG tablet Take 7.5 mg by mouth daily.    [provider]  montelukast (SINGULAIR) 10 MG tablet Take 1 tablet (10 mg total) by mouth at bedtime. 05/02/15   Withrow, Elyse Jarvis, FNP  nitroGLYCERIN (NITROSTAT) 0.4 MG SL tablet Place 1 tablet (0.4 mg total) under the tongue every 5 (five) minutes x 3 doses as needed for chest pain. 11/01/13   [name], MD  ondansetron (ZOFRAN ODT) 4 MG disintegrating tablet Take 1 tablet (4 mg total) by mouth every 8 (eight) hours as needed for nausea or vomiting. 01/31/17   Rai, Vernelle Emerald, MD  polyethylene glycol (MIRALAX) packet Take 17 g by mouth daily as needed for moderate constipation or severe constipation. 01/31/17   Rai, Ripudeep K, MD  prasugrel (EFFIENT) 10 MG TABS tablet Take 0.5 tablets (5 mg total) by mouth daily. 01/04/18   Mariel Aloe, MD  ramipril (ALTACE) 1.25 MG capsule Take 1 capsule (1.25 mg total) by mouth daily. 01/05/18   Mariel Aloe, MD  spironolactone (ALDACTONE) 25 MG tablet Take 1 tablet (25 mg total) by mouth daily. 07/06/15   Tat,  Shanon Brow, MD  tiZANidine (ZANAFLEX) 2 MG tablet Take by mouth every 6 (six) hours as needed for muscle spasms.    [provider]  traMADol (ULTRAM) 50 MG tablet Take 1 tablet (50 mg total) by mouth every 12 (twelve) hours as needed for severe pain. 01/31/17   Rai, Vernelle Emerald, MD      Allergies    Penicillins    Review of Systems   Review of Systems  All other systems reviewed and are negative.   Physical Exam Updated Vital Signs BP 114/82 (BP Location: Left Arm)   Pulse (!) 102   Temp (!) 97.4 F (36.3 C) (Oral)   Resp 18   SpO2 92%  Physical Exam Vitals  and nursing note reviewed.  Constitutional:      General: He is not in acute distress.    Appearance: Normal appearance. He is well-developed.  HENT:     Head: Normocephalic and atraumatic.  Eyes:     Conjunctiva/sclera: Conjunctivae normal.     Pupils: Pupils are equal, round, and reactive to light.  Cardiovascular:     Rate and Rhythm: Normal rate and regular rhythm.     Heart sounds: Normal heart sounds.  Pulmonary:     Effort: Pulmonary effort is normal. No respiratory distress.     Breath sounds: Normal breath sounds.  Abdominal:     General: There is no distension.     Palpations: Abdomen is soft.     Tenderness: There is no abdominal tenderness.  Musculoskeletal:        General: No deformity. Normal range of motion.     Cervical back: Normal range of motion and neck supple.  Skin:    General: Skin is warm and dry.  Neurological:     General: No focal deficit present.     Mental Status: He is alert and oriented to person, place, and time. Mental status is at baseline.     Comments: Alert, oriented x 4, normal speech, no focal weakness, VAN negative  Ambulatory, out of 5 strength in both upper and lower extremities  Diffuse "shaking" tremor noted in all extremities which halts with distraction     ED Results / Procedures / Treatments   Labs (all labs ordered are listed, but only abnormal results are displayed) Labs Reviewed  CBC WITH DIFFERENTIAL/PLATELET - Abnormal; Notable for the following components:      Result Value   Hemoglobin 12.6 (*)    HCT 37.6 (*)    RDW 18.3 (*)    All other components within normal limits  BASIC METABOLIC PANEL - Abnormal; Notable for the following components:   Sodium 134 (*)    Glucose, Bld 113 (*)    BUN 41 (*)    Creatinine, Ser 1.64 (*)    Calcium 8.6 (*)    GFR, Estimated 46 (*)    All other components within normal limits    EKG None  Radiology No results found.  Procedures Procedures    Medications Ordered in  ED Medications  LORazepam (ATIVAN) injection 0.5 mg (has no administration in time range)    ED Course/ Medical Decision Making/ A&P                             Medical Decision Making Amount and/or Complexity of Data Reviewed Labs: ordered.  Risk Prescription drug management.    Medical Screen Complete  This patient presented to the ED with  complaint of tremor.  This complaint involves an extensive number of treatment options. The initial differential diagnosis includes, but is not limited to, metabolic abnormality, etc.  This presentation is: Acute, Self-Limited, Previously Undiagnosed, Uncertain Prognosis, Complicated, Systemic Symptoms, and Threat to Life/Bodily Function  Patient is presenting with complaint of intermittent tremor x 2 days.  Patient associates this tremor with initiation of Synthroid.  Patient's tremor is reduced with distraction.  After administration of small amount of Ativan the patient feels significantly improved.  His tremor is resolved at time of discharge.  He admits to "family stress" with questioning.  He denies significant symptoms of depression, significant anxiety at baseline, suicidal ideation, homicidal ideation, etc.  Screening labs obtained are without significant abnormality.  Patient is aware of need for close outpatient follow-up.  Strict return precautions given and understood.   Additional history obtained:  External records from outside sources obtained and reviewed including prior ED visits and prior Inpatient records.    Lab Tests:  I ordered and personally interpreted labs.  The pertinent results include:  CBC BMP     Cardiac Monitoring:  The patient was maintained on a cardiac monitor.  I personally viewed and interpreted the cardiac monitor which showed an underlying rhythm of: NSR   Medicines ordered:  I ordered medication including ativan  for anxiety  Reevaluation of the patient after these medicines showed  that the patient: improved     Problem List / ED Course:  Tremor   Reevaluation:  After the interventions noted above, I reevaluated the patient and found that they have: improved   Disposition:  After consideration of the diagnostic results and the patients response to treatment, I feel that the patent would benefit from close outpatient followup.          Final Clinical Impression(s) / ED Diagnoses Final diagnoses:  Occasional tremors    Rx / DC Orders ED Discharge Orders     None         Valarie Merino, MD 12/25/22 1306

## 2022-12-25 NOTE — Discharge Instructions (Signed)
Return for any problem.  ?

## 2022-12-25 NOTE — ED Triage Notes (Signed)
Pt presents with c/o tremors for the past couple of days. Pt does report that he started a new thyroid medication around the time the tremors started. Pt is on oxygen at this time that he brought from home. Pt reports he does have it as needed and feels like he needs it right now but that he doesn't normally wear it all the time at home.

## 2022-12-27 ENCOUNTER — Other Ambulatory Visit: Payer: Self-pay

## 2022-12-27 ENCOUNTER — Emergency Department (HOSPITAL_COMMUNITY): Payer: 59

## 2022-12-27 ENCOUNTER — Emergency Department (HOSPITAL_COMMUNITY)
Admission: EM | Admit: 2022-12-27 | Discharge: 2022-12-27 | Disposition: A | Discharge status: Home / Self Care | Payer: 59 | Attending: Emergency Medicine | Admitting: Emergency Medicine

## 2022-12-27 ENCOUNTER — Encounter (HOSPITAL_COMMUNITY): Payer: Self-pay | Admitting: Emergency Medicine

## 2022-12-27 DIAGNOSIS — Z79899 Other long term (current) drug therapy: Secondary | ICD-10-CM | POA: Insufficient documentation

## 2022-12-27 DIAGNOSIS — R251 Tremor, unspecified: Secondary | ICD-10-CM | POA: Insufficient documentation

## 2022-12-27 DIAGNOSIS — I2510 Atherosclerotic heart disease of native coronary artery without angina pectoris: Secondary | ICD-10-CM | POA: Diagnosis not present

## 2022-12-27 DIAGNOSIS — I110 Hypertensive heart disease with heart failure: Secondary | ICD-10-CM | POA: Diagnosis not present

## 2022-12-27 DIAGNOSIS — R7989 Other specified abnormal findings of blood chemistry: Secondary | ICD-10-CM | POA: Insufficient documentation

## 2022-12-27 DIAGNOSIS — Z85118 Personal history of other malignant neoplasm of bronchus and lung: Secondary | ICD-10-CM | POA: Diagnosis not present

## 2022-12-27 DIAGNOSIS — Z7982 Long term (current) use of aspirin: Secondary | ICD-10-CM | POA: Insufficient documentation

## 2022-12-27 DIAGNOSIS — R011 Cardiac murmur, unspecified: Secondary | ICD-10-CM | POA: Diagnosis not present

## 2022-12-27 DIAGNOSIS — E039 Hypothyroidism, unspecified: Secondary | ICD-10-CM | POA: Insufficient documentation

## 2022-12-27 DIAGNOSIS — Z87891 Personal history of nicotine dependence: Secondary | ICD-10-CM | POA: Insufficient documentation

## 2022-12-27 DIAGNOSIS — J449 Chronic obstructive pulmonary disease, unspecified: Secondary | ICD-10-CM | POA: Insufficient documentation

## 2022-12-27 DIAGNOSIS — I5023 Acute on chronic systolic (congestive) heart failure: Secondary | ICD-10-CM | POA: Insufficient documentation

## 2022-12-27 DIAGNOSIS — R062 Wheezing: Secondary | ICD-10-CM | POA: Diagnosis not present

## 2022-12-27 DIAGNOSIS — Z20822 Contact with and (suspected) exposure to covid-19: Secondary | ICD-10-CM | POA: Diagnosis not present

## 2022-12-27 LAB — CBC WITH DIFFERENTIAL/PLATELET
Abs Immature Granulocytes: 0.02 10*3/uL (ref 0.00–0.07)
Basophils Absolute: 0.1 10*3/uL (ref 0.0–0.1)
Basophils Relative: 1 %
Eosinophils Absolute: 0.2 10*3/uL (ref 0.0–0.5)
Eosinophils Relative: 3 %
HCT: 36.3 % — ABNORMAL LOW (ref 39.0–52.0)
Hemoglobin: 12.2 g/dL — ABNORMAL LOW (ref 13.0–17.0)
Immature Granulocytes: 0 %
Lymphocytes Relative: 14 %
Lymphs Abs: 1 10*3/uL (ref 0.7–4.0)
MCH: 29.1 pg (ref 26.0–34.0)
MCHC: 33.6 g/dL (ref 30.0–36.0)
MCV: 86.6 fL (ref 80.0–100.0)
Monocytes Absolute: 0.7 10*3/uL (ref 0.1–1.0)
Monocytes Relative: 9 %
Neutro Abs: 5.4 10*3/uL (ref 1.7–7.7)
Neutrophils Relative %: 73 %
Platelets: 236 10*3/uL (ref 150–400)
RBC: 4.19 MIL/uL — ABNORMAL LOW (ref 4.22–5.81)
RDW: 18.3 % — ABNORMAL HIGH (ref 11.5–15.5)
WBC: 7.5 10*3/uL (ref 4.0–10.5)
nRBC: 0 % (ref 0.0–0.2)

## 2022-12-27 LAB — BLOOD GAS, VENOUS
Acid-Base Excess: 2.4 mmol/L — ABNORMAL HIGH (ref 0.0–2.0)
Bicarbonate: 27.2 mmol/L (ref 20.0–28.0)
O2 Saturation: 76.9 %
Patient temperature: 37
pCO2, Ven: 42 mmHg — ABNORMAL LOW (ref 44–60)
pH, Ven: 7.42 (ref 7.25–7.43)
pO2, Ven: 49 mmHg — ABNORMAL HIGH (ref 32–45)

## 2022-12-27 LAB — RESP PANEL BY RT-PCR (RSV, FLU A&B, COVID)  RVPGX2
Influenza A by PCR: NEGATIVE
Influenza B by PCR: NEGATIVE
Resp Syncytial Virus by PCR: NEGATIVE
SARS Coronavirus 2 by RT PCR: NEGATIVE

## 2022-12-27 LAB — COMPREHENSIVE METABOLIC PANEL
ALT: 43 U/L (ref 0–44)
AST: 55 U/L — ABNORMAL HIGH (ref 15–41)
Albumin: 3.9 g/dL (ref 3.5–5.0)
Alkaline Phosphatase: 95 U/L (ref 38–126)
Anion gap: 11 (ref 5–15)
BUN: 39 mg/dL — ABNORMAL HIGH (ref 8–23)
CO2: 21 mmol/L — ABNORMAL LOW (ref 22–32)
Calcium: 8.9 mg/dL (ref 8.9–10.3)
Chloride: 102 mmol/L (ref 98–111)
Creatinine, Ser: 1.46 mg/dL — ABNORMAL HIGH (ref 0.61–1.24)
GFR, Estimated: 53 mL/min — ABNORMAL LOW (ref >60)
Glucose, Bld: 104 mg/dL — ABNORMAL HIGH (ref 70–99)
Potassium: 4 mmol/L (ref 3.5–5.1)
Sodium: 134 mmol/L — ABNORMAL LOW (ref 135–145)
Total Bilirubin: 0.7 mg/dL (ref 0.3–1.2)
Total Protein: 7.5 g/dL (ref 6.5–8.1)

## 2022-12-27 LAB — MAGNESIUM: Magnesium: 2.2 mg/dL (ref 1.7–2.4)

## 2022-12-27 LAB — TROPONIN I (HIGH SENSITIVITY)
Troponin I (High Sensitivity): 68 ng/L — ABNORMAL HIGH (ref <18)
Troponin I (High Sensitivity): 71 ng/L — ABNORMAL HIGH (ref <18)

## 2022-12-27 LAB — BRAIN NATRIURETIC PEPTIDE: B Natriuretic Peptide: 752.6 pg/mL — ABNORMAL HIGH (ref 0.0–100.0)

## 2022-12-27 MED ORDER — LORAZEPAM 1 MG PO TABS
1.0000 mg | ORAL_TABLET | Freq: Once | ORAL | Status: AC
  Administered 2022-12-27: 1 mg via ORAL
  Filled 2022-12-27: qty 1

## 2022-12-27 MED ORDER — LORAZEPAM 1 MG PO TABS: 1.0000 mg | ORAL_TABLET | Freq: Two times a day (BID) | ORAL | 0 refills | Status: DC | PRN

## 2022-12-27 NOTE — ED Notes (Signed)
Pt not allowing to obtain gold top for blood

## 2022-12-27 NOTE — ED Notes (Signed)
Sister Deneise Lever called. She will come pick him up.

## 2022-12-27 NOTE — ED Provider Notes (Signed)
[name]  CSN: [id number] Arrival date & time: 12/27/22 4098  Chief Complaint(s) Shaking (States has been shaking since he started his thyroid medication.)  HPI Ronald [name] is a 67 y.o. male with past medical history as below, significant for CHF, COPD, CAD, hypothyroidism, drug abuse who presents to the ED with complaint of tremors.  Patient was seen 2 days ago with similar complaint.  Reports that since initiating Synthroid he said tremors and "shaking all over."  Denies any other complaint such as difficulty breathing, chest pain, nausea or vomiting, no increased anxiety or other medication adjustments.   He feels that all symptoms are secondary to the thyroid medication/Synthroid when asked about other medications or symptoms he becomes angry and reiterates that it is only the thyroid medication that is causing all of his problems today.  Reports not sleeping well.  Does not verbalize HI or SI.  Past Medical History Past Medical History:  Diagnosis Date   CHF (congestive heart failure) (HCC)    COPD (chronic obstructive pulmonary disease) (HCC)    Coronary artery disease    Hypertension    lung ca dx'd 2005   chemo/xrt comp 2005, lung ca   Myocardial infarction [facility])    Shortness of breath    Patient Active Problem List   Diagnosis Date Noted   Port-A-Cath in place 12/15/2022   Acute on chronic congestive heart failure ([place]) 12/30/2017   Hypotension 12/30/2017   History of pneumonectomy    Odynophagia    CAP (community acquired pneumonia) 01/28/2017   AKI (acute kidney injury) ([place]) 01/28/2017   Unstable angina ([place]) 10/10/2015   COPD exacerbation ([place]) 07/04/2015   Essential hypertension 07/04/2015   Acute respiratory failure with hypoxia ([place]) 07/04/2015   lung ca    Major depressive disorder, single episode 05/01/2015   Drug overdose, intentional ([place])    Chronic systolic CHF (congestive heart failure)  10/30/2013   COPD with acute exacerbation ([place]) 09/16/2012   Tobacco use 09/16/2012   COPD (chronic obstructive pulmonary disease) ([place]) 09/15/2012   Status post cardiac catheterization 11/91/4782   Acute systolic CHF (congestive heart failure) ([place]) 04/08/2012   Tobacco abuse 04/08/2012   Acute bronchitis 04/08/2012   CAD (coronary artery disease) 04/06/2012   Malignant neoplasm of unspecified part of unspecified bronchus or lung ([place]) 04/06/2012   Mitral regurgitation 04/06/2012   Cocaine abuse ([place]) 04/06/2012   Home Medication(s) Prior to Admission medications   Medication Sig Start Date End Date Taking? Authorizing Provider  LORazepam (ATIVAN) 1 MG tablet Take 1 tablet (1 mg total) by mouth 2 (two) times daily as needed for anxiety. 12/27/22  Yes Wynona Dove A, DO  albuterol (PROVENTIL HFA;VENTOLIN HFA) 108 (90 BASE) MCG/ACT inhaler Inhale 2 puffs into the lungs every 4 (four) hours as needed for wheezing or shortness of breath. 10/14/13   Ward, Delice Bison, DO  aspirin 325 MG tablet Take 325 mg by mouth 2 (two) times a week.    [provider]  budesonide-formoterol (SYMBICORT) 160-4.5 MCG/ACT inhaler Inhale 2 puffs into the lungs 2 (two) times daily.    [provider]  carvedilol (COREG) 3.125 MG tablet Take 1 tablet (3.125 mg total) by mouth 2 (two) times daily with a meal. 07/06/15   Tat, Shanon Brow, MD  Fluticasone-Umeclidin-Vilant (TRELEGY ELLIPTA IN) Inhale into the lungs.    [provider]  furosemide (LASIX) 40 MG tablet Take 1 tablet (40 mg total) by mouth 2 (  two) times daily. Twice a day for 3 days, then back to normal dosing. Patient taking differently: Take 40 mg by mouth daily as needed for fluid.  05/02/15   Withrow, Elyse Jarvis, FNP  guaiFENesin (MUCINEX) 600 MG 12 hr tablet Take 2 tablets (1,200 mg total) by mouth 2 (two) times daily. Patient taking differently: Take 1,200 mg by mouth 2 (two) times daily as needed for cough or to loosen phlegm.  01/31/17    Rai, Vernelle Emerald, MD  levothyroxine (SYNTHROID) 50 MCG tablet Take 1 tablet (50 mcg total) by mouth daily before breakfast. 12/16/22   Curt Bears, MD  lidocaine-prilocaine (EMLA) cream Apply 1 Application topically as needed (To Port-a-Cath). 12/10/22   Curt Bears, MD  meloxicam (MOBIC) 7.5 MG tablet Take 7.5 mg by mouth daily.    [provider]  montelukast (SINGULAIR) 10 MG tablet Take 1 tablet (10 mg total) by mouth at bedtime. 05/02/15   Withrow, Elyse Jarvis, FNP  nitroGLYCERIN (NITROSTAT) 0.4 MG SL tablet Place 1 tablet (0.4 mg total) under the tongue every 5 (five) minutes x 3 doses as needed for chest pain. 11/01/13   [name], MD  ondansetron (ZOFRAN ODT) 4 MG disintegrating tablet Take 1 tablet (4 mg total) by mouth every 8 (eight) hours as needed for nausea or vomiting. 01/31/17   Rai, Vernelle Emerald, MD  polyethylene glycol (MIRALAX) packet Take 17 g by mouth daily as needed for moderate constipation or severe constipation. 01/31/17   Rai, Ripudeep K, MD  prasugrel (EFFIENT) 10 MG TABS tablet Take 0.5 tablets (5 mg total) by mouth daily. 01/04/18   Mariel Aloe, MD  ramipril (ALTACE) 1.25 MG capsule Take 1 capsule (1.25 mg total) by mouth daily. 01/05/18   Mariel Aloe, MD  spironolactone (ALDACTONE) 25 MG tablet Take 1 tablet (25 mg total) by mouth daily. 07/06/15   Orson Eva, MD  tiZANidine (ZANAFLEX) 2 MG tablet Take by mouth every 6 (six) hours as needed for muscle spasms.    [provider]  traMADol (ULTRAM) 50 MG tablet Take 1 tablet (50 mg total) by mouth every 12 (twelve) hours as needed for severe pain. 01/31/17   Mendel Corning, MD                                                                                                                                    Past Surgical History Past Surgical History:  Procedure Laterality Date   CARDIAC CATHETERIZATION N/A 10/13/2015   Procedure: Left Heart Cath and Coronary Angiography;  Surgeon: [name],  MD;  Location: [place] [facility];  Service: Cardiovascular;  Laterality: N/A;   CORONARY ANGIOPLASTY WITH STENT PLACEMENT     LEFT HEART CATH AND CORONARY ANGIOGRAPHY N/A 01/03/2018   Procedure: LEFT HEART CATH AND CORONARY ANGIOGRAPHY;  Surgeon: [name], MD;  Location: [place] [facility];  Service: Cardiovascular;  Laterality: N/A;  LEFT HEART CATHETERIZATION WITH CORONARY ANGIOGRAM N/A 04/07/2012   Procedure: LEFT HEART CATHETERIZATION WITH CORONARY ANGIOGRAM;  Surgeon: [name], MD;  Location: [facility];  Service: Cardiovascular;  Laterality: N/A;   PNEUMONECTOMY  2005   left   vocal cord surgery  2005   Family History Family History  Problem Relation Age of Onset   Hypertension Other    Diabetes Other     Social History Social History   Tobacco Use   Smoking status: Former    Types: Cigarettes    Quit date: 09/30/2013    Years since quitting: 9.2   Smokeless tobacco: Never  Substance Use Topics   Alcohol use: No    Comment: former   Drug use: No    Types: Cocaine   Allergies Penicillins  Review of Systems Review of Systems  Constitutional:  Negative for chills and fever.  HENT:  Negative for facial swelling and trouble swallowing.   Eyes:  Negative for photophobia and visual disturbance.  Respiratory:  Negative for cough and shortness of breath.   Cardiovascular:  Negative for chest pain and palpitations.  Gastrointestinal:  Negative for abdominal pain, nausea and vomiting.  Endocrine: Negative for polydipsia and polyuria.  Genitourinary:  Negative for difficulty urinating and hematuria.  Musculoskeletal:  Negative for gait problem and joint swelling.  Skin:  Negative for pallor and rash.  Neurological:  Positive for tremors. Negative for syncope and headaches.  Psychiatric/Behavioral:  Positive for sleep disturbance. Negative for agitation and confusion.     Physical Exam Vital Signs  I have reviewed the triage vital signs BP 98/68 (BP  Location: Right Arm)   Pulse 85   Temp 98 F (36.7 C) (Oral)   Resp 18   Ht 5' (1.524 m)   Wt 66.8 kg   SpO2 98%   BMI 28.76 kg/m  Physical Exam Vitals and nursing note reviewed.  Constitutional:      General: He is not in acute distress.    Appearance: He is well-developed. He is not ill-appearing.  HENT:     Head: Normocephalic and atraumatic.     Right Ear: External ear normal.     Left Ear: External ear normal.     Mouth/Throat:     Mouth: Mucous membranes are moist.  Eyes:     General: No scleral icterus. Cardiovascular:     Rate and Rhythm: Normal rate and regular rhythm.     Pulses: Normal pulses.     Heart sounds: Murmur heard.  Pulmonary:     Effort: Pulmonary effort is normal. No respiratory distress.     Breath sounds: Wheezing present.  Abdominal:     General: Abdomen is flat.     Palpations: Abdomen is soft.     Tenderness: There is no abdominal tenderness.  Musculoskeletal:     Cervical back: No rigidity.     Right lower leg: No edema.     Left lower leg: No edema.  Skin:    General: Skin is warm and dry.     Capillary Refill: Capillary refill takes less than 2 seconds.  Neurological:     Mental Status: He is alert and oriented to person, place, and time.     GCS: GCS eye subscore is 4. GCS verbal subscore is 5. GCS motor subscore is 6.     Cranial Nerves: No dysarthria or facial asymmetry.     Motor: Tremor present. No seizure activity.     Comments: He is  tremulous but is resolved with distraction or purposeful movements.  While nurse is placing his IV his right upper extremity is not shaking at all whatsoever while the rest of his body is shaking.  When she is not attempting to place his IV his right arm started shaking.  Moving all 4 extremities spontaneously Extremities and eyes will cross midline  Psychiatric:        Mood and Affect: Mood normal.        Behavior: Behavior normal.     ED Results and Treatments Labs (all labs ordered are  listed, but only abnormal results are displayed) Labs Reviewed  BRAIN NATRIURETIC PEPTIDE - Abnormal; Notable for the following components:      Result Value   B Natriuretic Peptide 752.6 (*)    All other components within normal limits  CBC WITH DIFFERENTIAL/PLATELET - Abnormal; Notable for the following components:   RBC 4.19 (*)    Hemoglobin 12.2 (*)    HCT 36.3 (*)    RDW 18.3 (*)    All other components within normal limits  BLOOD GAS, VENOUS - Abnormal; Notable for the following components:   pCO2, Ven 42 (*)    pO2, Ven 49 (*)    Acid-Base Excess 2.4 (*)    All other components within normal limits  COMPREHENSIVE METABOLIC PANEL - Abnormal; Notable for the following components:   Sodium 134 (*)    CO2 21 (*)    Glucose, Bld 104 (*)    BUN 39 (*)    Creatinine, Ser 1.46 (*)    AST 55 (*)    GFR, Estimated 53 (*)    All other components within normal limits  TROPONIN I (HIGH SENSITIVITY) - Abnormal; Notable for the following components:   Troponin I (High Sensitivity) 68 (*)    All other components within normal limits  TROPONIN I (HIGH SENSITIVITY) - Abnormal; Notable for the following components:   Troponin I (High Sensitivity) 71 (*)    All other components within normal limits  RESP PANEL BY RT-PCR (RSV, FLU A&B, COVID)  RVPGX2  MAGNESIUM                                                                                                                          Radiology No results found. Results for orders placed or performed during the hospital encounter of 12/27/22  Resp panel by RT-PCR (RSV, Flu A&B, Covid) Anterior Nasal Swab   Specimen: Anterior Nasal Swab  Result Value Ref Range   SARS Coronavirus 2 by RT PCR NEGATIVE NEGATIVE   Influenza A by PCR NEGATIVE NEGATIVE   Influenza B by PCR NEGATIVE NEGATIVE   Resp Syncytial Virus by PCR NEGATIVE NEGATIVE  Brain natriuretic peptide  Result Value Ref Range   B Natriuretic Peptide 752.6 (H) 0.0 - 100.0 pg/mL   CBC with Differential  Result Value Ref Range   WBC 7.5 4.0 - 10.5 K/uL   RBC 4.19 (L) 4.22 -  5.81 MIL/uL   Hemoglobin 12.2 (L) 13.0 - 17.0 g/dL   HCT 36.3 (L) 39.0 - 52.0 %   MCV 86.6 80.0 - 100.0 fL   MCH 29.1 26.0 - 34.0 pg   MCHC 33.6 30.0 - 36.0 g/dL   RDW 18.3 (H) 11.5 - 15.5 %   Platelets 236 150 - 400 K/uL   nRBC 0.0 0.0 - 0.2 %   Neutrophils Relative % 73 %   Neutro Abs 5.4 1.7 - 7.7 K/uL   Lymphocytes Relative 14 %   Lymphs Abs 1.0 0.7 - 4.0 K/uL   Monocytes Relative 9 %   Monocytes Absolute 0.7 0.1 - 1.0 K/uL   Eosinophils Relative 3 %   Eosinophils Absolute 0.2 0.0 - 0.5 K/uL   Basophils Relative 1 %   Basophils Absolute 0.1 0.0 - 0.1 K/uL   Immature Granulocytes 0 %   Abs Immature Granulocytes 0.02 0.00 - 0.07 K/uL  Magnesium  Result Value Ref Range   Magnesium 2.2 1.7 - 2.4 mg/dL  Blood gas, venous (at [facility] and [facility])  Result Value Ref Range   pH, Ven 7.42 7.25 - 7.43   pCO2, Ven 42 (L) 44 - 60 mmHg   pO2, Ven 49 (H) 32 - 45 mmHg   Bicarbonate 27.2 20.0 - 28.0 mmol/L   Acid-Base Excess 2.4 (H) 0.0 - 2.0 mmol/L   O2 Saturation 76.9 %   Patient temperature 37.0   Comprehensive metabolic panel  Result Value Ref Range   Sodium 134 (L) 135 - 145 mmol/L   Potassium 4.0 3.5 - 5.1 mmol/L   Chloride 102 98 - 111 mmol/L   CO2 21 (L) 22 - 32 mmol/L   Glucose, Bld 104 (H) 70 - 99 mg/dL   BUN 39 (H) 8 - 23 mg/dL   Creatinine, Ser 1.46 (H) 0.61 - 1.24 mg/dL   Calcium 8.9 8.9 - 10.3 mg/dL   Total Protein 7.5 6.5 - 8.1 g/dL   Albumin 3.9 3.5 - 5.0 g/dL   AST 55 (H) 15 - 41 U/L   ALT 43 0 - 44 U/L   Alkaline Phosphatase 95 38 - 126 U/L   Total Bilirubin 0.7 0.3 - 1.2 mg/dL   GFR, Estimated 53 (L) >60 mL/min   Anion gap 11 5 - 15  Troponin I (High Sensitivity)  Result Value Ref Range   Troponin I (High Sensitivity) 68 (H) <18 ng/L  Troponin I (High Sensitivity)  Result Value Ref Range   Troponin I (High Sensitivity) 71 (H) <18 ng/L     Pertinent labs & imaging  results that were available during my care of the patient were reviewed by me and considered in my medical decision making (see MDM for details).  Medications Ordered in ED Medications  LORazepam (ATIVAN) tablet 1 mg (1 mg Oral Given 12/27/22 0802)  Procedures Procedures  (including critical care time)  Medical Decision Making / ED Course    Medical Decision Making:    Ronald [name] is a 67 y.o. male with past medical history as below, significant for CHF, COPD, CAD, hypothyroidism, drug abuse who presents to the ED with complaint of tremors.. The complaint involves an extensive differential diagnosis and also carries with it a high risk of complications and morbidity.  Serious etiology was considered. Ddx includes but is not limited to: Medication effect, psychosomatic, electrolyte derangement, endocrine derangement, etc.  Complete initial physical exam performed, notably the patient  was no acute distress, breathing comfortably on ambient air, shaking diffusely.    Reviewed and confirmed nursing documentation for past medical history, family history, social history.  Vital signs reviewed.    Clinical Course as of 12/28/22 0109  Mon Dec 27, 2022  1533 Pt refusing any further care or labs to be drawn. I have low suspicion for thyroid emergency given stable vital signs. His tremors appear to be functional, with distraction he is not tremulous. His neuro exam is not focal.  [SG]    Clinical Course User Index [SG] Wynona Dove A, DO   On arrival patient shaking diffusely, no seizure activity, no rhythmic motions.  Shaking has resolved with distraction.  Will collect screening labs, get x-ray, give small dose of Ativan.  Symptoms appear very similar presentation 2 days ago per chart review.  Labs reviewed, he has elevated BNP no evidence of fluid overload  on exam, lungs are clear on x-ray and is no significant lower extremity edema.  Troponins elevated but flat.  He has no chest pain or dyspnea. XR stable.  Patient did have marked improvement following lorazepam.  Will give short course of this for home.  Patient is refusing to have repeat TSH and T4 drawn as initial labs were hemolyzed.  He is refusing any further care at this time  Advised patient follow-up with cardiology in regards to his abnormal lab findings from today and also to help establish care as he is recently moved in the area and does not have PCP, he is from [place]  The patient improved significantly and was discharged in stable condition. Detailed discussions were had with the patient regarding current findings, and need for close f/u with PCP or on call doctor. The patient has been instructed to return immediately if the symptoms worsen in any way for re-evaluation. Patient verbalized understanding and is in agreement with current care plan. All questions answered prior to discharge.        Additional history obtained: -Additional history obtained from na -External records from outside source obtained and reviewed including: Chart review including previous notes, labs, imaging, consultation notes including recent ED visits, prior labs and imaging, home medications   Lab Tests: -I ordered, reviewed, and interpreted labs.   The pertinent results include:   Labs Reviewed  BRAIN NATRIURETIC PEPTIDE - Abnormal; Notable for the following components:      Result Value   B Natriuretic Peptide 752.6 (*)    All other components within normal limits  CBC WITH DIFFERENTIAL/PLATELET - Abnormal; Notable for the following components:   RBC 4.19 (*)    Hemoglobin 12.2 (*)    HCT 36.3 (*)    RDW 18.3 (*)    All other components within normal limits  BLOOD GAS, VENOUS - Abnormal; Notable for the following components:   pCO2, Ven 42 (*)    pO2, Ven 49 (*)  Acid-Base  Excess 2.4 (*)    All other components within normal limits  COMPREHENSIVE METABOLIC PANEL - Abnormal; Notable for the following components:   Sodium 134 (*)    CO2 21 (*)    Glucose, Bld 104 (*)    BUN 39 (*)    Creatinine, Ser 1.46 (*)    AST 55 (*)    GFR, Estimated 53 (*)    All other components within normal limits  TROPONIN I (HIGH SENSITIVITY) - Abnormal; Notable for the following components:   Troponin I (High Sensitivity) 68 (*)    All other components within normal limits  TROPONIN I (HIGH SENSITIVITY) - Abnormal; Notable for the following components:   Troponin I (High Sensitivity) 71 (*)    All other components within normal limits  RESP PANEL BY RT-PCR (RSV, FLU A&B, COVID)  RVPGX2  MAGNESIUM    Notable for as above  EKG   EKG Interpretation  Date/Time:    Ventricular Rate:    PR Interval:    QRS Duration:   QT Interval:    QTC Calculation:   R Axis:     Text Interpretation:           Imaging Studies ordered: I ordered imaging studies including chest x-ray I independently visualized the following imaging with scope of interpretation limited to determining acute life threatening conditions related to emergency care: Chest x-ray was stable, redemonstration of lung cancer I independently visualized and interpreted imaging. I agree with the radiologist interpretation   Medicines ordered and prescription drug management: Meds ordered this encounter  Medications   LORazepam (ATIVAN) tablet 1 mg   LORazepam (ATIVAN) 1 MG tablet    Sig: Take 1 tablet (1 mg total) by mouth 2 (two) times daily as needed for anxiety.    Dispense:  5 tablet    Refill:  0    -I have reviewed the patients home medicines and have made adjustments as needed   Consultations Obtained: Not applicable  Cardiac Monitoring: The patient was maintained on a cardiac monitor.  I personally viewed and interpreted the cardiac monitored which showed an underlying rhythm of: NSR  Social  Determinants of Health:  Diagnosis or treatment significantly limited by social determinants of health: former [facility] p[facility]   Reevaluation: After the interventions noted above, I reevaluated the patient and found that they have resolved  Co morbidities that complicate the patient evaluation  Past Medical History:  Diagnosis Date   CHF (congestive heart failure) (HCC)    COPD (chronic obstructive pulmonary disease) ([place])    Coronary artery disease    Hypertension    lung ca dx'd 2005   chemo/xrt comp 2005, lung ca   Myocardial infarction ([place])    Shortness of breath       Dispostion: Disposition decision including need for hospitalization was considered, and patient discharged from emergency department.    Final Clinical Impression(s) / ED Diagnoses Final diagnoses:  Elevated troponin  Intermittent tremor     This chart was dictated using voice recognition software.  Despite best efforts to proofread,  errors can occur which can change the documentation meaning.    Jeanell Sparrow, DO 12/28/22 [phone]

## 2022-12-27 NOTE — Discharge Instructions (Addendum)
It was a pleasure caring for you today in the emergency department.  Please return to the emergency department for any worsening or worrisome symptoms.  Please follow up with PCP in regards to thyroid levels

## 2022-12-29 ENCOUNTER — Other Ambulatory Visit: Payer: Self-pay

## 2022-12-30 ENCOUNTER — Ambulatory Visit: Payer: 59 | Attending: Internal Medicine | Admitting: Internal Medicine

## 2022-12-30 NOTE — Progress Notes (Deleted)
Cardiology Office Note:    Date:  12/30/2022   ID:  Ronald [name], DOB 1956-03-09, MRN [id number]  PCP:  System, Provider Not In   [facility] Providers Cardiologist:  None { Click to update primary MD,subspecialty MD or APP then REFRESH:1}    Referring MD: Jeanell Sparrow, DO   No chief complaint on file. ***  History of Present Illness:    Ronald [name] is a 67 y.o. male with a hx of COPD, CHF ***, HTN, Lung CA s/p chemo and radiation 2005, drug abuse, referral for an elevated troponin. He went to the ED with tremors and shaking , withdrawal? There was no leading diagnosis.A troponin was drawn and it was 71.  Has hx of CAD. LHC 01/03/2018 showed prox-mid Lcx 100% stenosis, mild diffuse dx. Patient of Dr. Terrence Dupont  Past Medical History:  Diagnosis Date   CHF (congestive heart failure) ([place])    COPD (chronic obstructive pulmonary disease) ([place])    Coronary artery disease    Hypertension    lung ca dx'd 2005   chemo/xrt comp 2005, lung ca   Myocardial infarction [facility])    Shortness of breath     Past Surgical History:  Procedure Laterality Date   CARDIAC CATHETERIZATION N/A 10/13/2015   Procedure: Left Heart Cath and Coronary Angiography;  Surgeon: [name], MD;  Location: [facility];  Service: Cardiovascular;  Laterality: N/A;   CORONARY ANGIOPLASTY WITH STENT PLACEMENT     LEFT HEART CATH AND CORONARY ANGIOGRAPHY N/A 01/03/2018   Procedure: LEFT HEART CATH AND CORONARY ANGIOGRAPHY;  Surgeon: [name], MD;  Location: [place] [facility];  Service: Cardiovascular;  Laterality: N/A;   LEFT HEART CATHETERIZATION WITH CORONARY ANGIOGRAM N/A 04/07/2012   Procedure: LEFT HEART CATHETERIZATION WITH CORONARY ANGIOGRAM;  Surgeon: [name], MD;  Location: [facility];  Service: Cardiovascular;  Laterality: N/A;   PNEUMONECTOMY  2005   left   vocal cord surgery  2005    Current Medications: No outpatient medications have been marked as taking  for the 12/30/22 encounter (Appointment) with Janina Mayo, MD.     Allergies:   Penicillins   Social History   Socioeconomic History   Marital status: Married    Spouse name: Not on file   Number of children: Not on file   Years of education: Not on file   Highest education level: Not on file  Occupational History   Not on file  Tobacco Use   Smoking status: Former    Types: Cigarettes    Quit date: 09/30/2013    Years since quitting: 9.2   Smokeless tobacco: Never  Substance and Sexual Activity   Alcohol use: No    Comment: former   Drug use: No    Types: Cocaine   Sexual activity: Not Currently  Other Topics Concern   Not on file  Social History Narrative   Not on file   Social Determinants of Health   Financial Resource Strain: Not on file  Food Insecurity: Not on file  Transportation Needs: Not on file  Physical Activity: Not on file  Stress: Not on file  Social Connections: Not on file     Family History: The patient's ***family history includes Diabetes in an other family member; Hypertension in an other family member.  ROS:   Please see the history of present illness.    *** All other systems reviewed and are negative.  EKGs/Labs/Other Studies Reviewed:  The following studies were reviewed today: ***  EKG:  EKG is *** ordered today.  The ekg ordered today demonstrates ***  Recent Labs: 12/15/2022: TSH 102.371 12/27/2022: ALT 43; B Natriuretic Peptide 752.6; BUN 39; Creatinine, Ser 1.46; Hemoglobin 12.2; Magnesium 2.2; Platelets 236; Potassium 4.0; Sodium 134  Recent Lipid Panel    Component Value Date/Time   CHOL 160 10/11/2015 0232   TRIG 39 10/11/2015 0232   HDL 39 (L) 10/11/2015 0232   CHOLHDL 4.1 10/11/2015 0232   VLDL 8 10/11/2015 0232   LDLCALC 113 (H) 10/11/2015 0232     Risk Assessment/Calculations:   {Does this patient have ATRIAL FIBRILLATION?:[phone]}  No BP recorded.  {Refresh Note OR Click here to enter BP  :1}***          Physical Exam:    VS:  There were no vitals taken for this visit.    Wt Readings from Last 3 Encounters:  12/27/22 147 lb 4.3 oz (66.8 kg)  12/15/22 147 lb 4.8 oz (66.8 kg)  12/08/22 148 lb 9 oz (67.4 kg)     GEN: *** Well nourished, well developed in no acute distress HEENT: Normal NECK: No JVD; No carotid bruits LYMPHATICS: No lymphadenopathy CARDIAC: ***RRR, no murmurs, rubs, gallops RESPIRATORY:  Clear to auscultation without rales, wheezing or rhonchi  ABDOMEN: Soft, non-tender, non-distended MUSCULOSKELETAL:  No edema; No deformity  SKIN: Warm and dry NEUROLOGIC:  Alert and oriented x 3 PSYCHIATRIC:  Normal affect   ASSESSMENT:    + troponin: very minimal elevation. His presentation is not concerning for ACS or myocarditis. PLAN:    In order of problems listed above:  ***      {Are you ordering a CV Procedure (e.g. stress test, cath, DCCV, TEE, etc)?   Press F2        :[id number]}    Medication Adjustments/Labs and Tests Ordered: Current medicines are reviewed at length with the patient today.  Concerns regarding medicines are outlined above.  No orders of the defined types were placed in this encounter.  No orders of the defined types were placed in this encounter.   There are no Patient Instructions on file for this visit.   Signed, Janina Mayo, MD  12/30/2022 8:50 AM    [place]

## 2023-01-05 ENCOUNTER — Inpatient Hospital Stay: Payer: 59 | Attending: Internal Medicine

## 2023-01-05 ENCOUNTER — Encounter: Payer: Self-pay | Admitting: Medical Oncology

## 2023-01-05 ENCOUNTER — Encounter: Payer: Self-pay | Admitting: Internal Medicine

## 2023-01-05 ENCOUNTER — Inpatient Hospital Stay: Payer: 59

## 2023-01-05 ENCOUNTER — Inpatient Hospital Stay (HOSPITAL_BASED_OUTPATIENT_CLINIC_OR_DEPARTMENT_OTHER): Payer: 59 | Admitting: Internal Medicine

## 2023-01-05 ENCOUNTER — Other Ambulatory Visit: Payer: 59

## 2023-01-05 VITALS — BP 100/61 | HR 80 | Temp 97.6°F | Resp 16 | Wt 146.3 lb

## 2023-01-05 VITALS — BP 88/56 | HR 69 | Resp 17

## 2023-01-05 DIAGNOSIS — I110 Hypertensive heart disease with heart failure: Secondary | ICD-10-CM | POA: Diagnosis not present

## 2023-01-05 DIAGNOSIS — C3490 Malignant neoplasm of unspecified part of unspecified bronchus or lung: Secondary | ICD-10-CM | POA: Diagnosis not present

## 2023-01-05 DIAGNOSIS — Z923 Personal history of irradiation: Secondary | ICD-10-CM | POA: Diagnosis not present

## 2023-01-05 DIAGNOSIS — Z5112 Encounter for antineoplastic immunotherapy: Secondary | ICD-10-CM | POA: Diagnosis present

## 2023-01-05 DIAGNOSIS — C3491 Malignant neoplasm of unspecified part of right bronchus or lung: Secondary | ICD-10-CM | POA: Insufficient documentation

## 2023-01-05 DIAGNOSIS — I509 Heart failure, unspecified: Secondary | ICD-10-CM | POA: Diagnosis not present

## 2023-01-05 DIAGNOSIS — Z95828 Presence of other vascular implants and grafts: Secondary | ICD-10-CM

## 2023-01-05 LAB — CMP (CANCER CENTER ONLY)
ALT: 22 U/L (ref 0–44)
AST: 29 U/L (ref 15–41)
Albumin: 3.9 g/dL (ref 3.5–5.0)
Alkaline Phosphatase: 81 U/L (ref 38–126)
Anion gap: 8 (ref 5–15)
BUN: 30 mg/dL — ABNORMAL HIGH (ref 8–23)
CO2: 24 mmol/L (ref 22–32)
Calcium: 8.6 mg/dL — ABNORMAL LOW (ref 8.9–10.3)
Chloride: 103 mmol/L (ref 98–111)
Creatinine: 1.39 mg/dL — ABNORMAL HIGH (ref 0.61–1.24)
GFR, Estimated: 56 mL/min — ABNORMAL LOW (ref >60)
Glucose, Bld: 88 mg/dL (ref 70–99)
Potassium: 4.3 mmol/L (ref 3.5–5.1)
Sodium: 135 mmol/L (ref 135–145)
Total Bilirubin: 0.5 mg/dL (ref 0.3–1.2)
Total Protein: 7.3 g/dL (ref 6.5–8.1)

## 2023-01-05 LAB — CBC WITH DIFFERENTIAL (CANCER CENTER ONLY)
Abs Immature Granulocytes: 0.02 10*3/uL (ref 0.00–0.07)
Basophils Absolute: 0.1 10*3/uL (ref 0.0–0.1)
Basophils Relative: 1 %
Eosinophils Absolute: 0.3 10*3/uL (ref 0.0–0.5)
Eosinophils Relative: 5 %
HCT: 39 % (ref 39.0–52.0)
Hemoglobin: 13.4 g/dL (ref 13.0–17.0)
Immature Granulocytes: 0 %
Lymphocytes Relative: 19 %
Lymphs Abs: 1.1 10*3/uL (ref 0.7–4.0)
MCH: 29.9 pg (ref 26.0–34.0)
MCHC: 34.4 g/dL (ref 30.0–36.0)
MCV: 87.1 fL (ref 80.0–100.0)
Monocytes Absolute: 0.7 10*3/uL (ref 0.1–1.0)
Monocytes Relative: 13 %
Neutro Abs: 3.5 10*3/uL (ref 1.7–7.7)
Neutrophils Relative %: 62 %
Platelet Count: 276 10*3/uL (ref 150–400)
RBC: 4.48 MIL/uL (ref 4.22–5.81)
RDW: 18.4 % — ABNORMAL HIGH (ref 11.5–15.5)
WBC Count: 5.6 10*3/uL (ref 4.0–10.5)
nRBC: 0 % (ref 0.0–0.2)

## 2023-01-05 MED ORDER — SODIUM CHLORIDE 0.9% FLUSH
10.0000 mL | Freq: Once | INTRAVENOUS | Status: AC
  Administered 2023-01-05: 10 mL

## 2023-01-05 MED ORDER — SODIUM CHLORIDE 0.9 % IV SOLN
200.0000 mg | Freq: Once | INTRAVENOUS | Status: AC
  Administered 2023-01-05: 200 mg via INTRAVENOUS
  Filled 2023-01-05: qty 8

## 2023-01-05 MED ORDER — SODIUM CHLORIDE 0.9 % IV SOLN: Freq: Once | INTRAVENOUS | Status: AC

## 2023-01-05 NOTE — Progress Notes (Signed)
Patient seen by MD today  Vitals are within treatment parameters.  Labs reviewed: and are within treatment parameters.  Per physician team, patient is ready for treatment and there are NO modifications to the treatment plan.

## 2023-01-05 NOTE — Progress Notes (Unsigned)
ex

## 2023-01-05 NOTE — Progress Notes (Signed)
[place] [phone]   [phone]  OFFICE PROGRESS NOTE  System, Provider Not In No address on file  DIAGNOSIS: 1)  Stage IV (T4, N3, M1 C) non-small cell lung cancer and March 2023 presented with right middle lobe and right upper lobe pulmonary nodules in addition to mediastinal and supraclavicular lymphadenopathy as well as suspicious splenic metastasis.  2) The patient has a history for stage IIIa non-small cell lung cancer diagnosed in 2004 status post neoadjuvant concurrent chemoradiation followed by consolidation chemotherapy followed by left pneumonectomy and has been in observation for almost 19 years before he has the new diagnosis of lung cancer in 2023.   PRIOR THERAPY: Status post 4 cycles of systemic chemotherapy with carboplatin, paclitaxel and Keytruda at [facility] in [place].  CURRENT THERAPY: Maintenance treatment with Keytruda 200 Mg IV every 3 weeks status post 4 cycles.  INTERVAL HISTORY: Ronald [name] 67 y.o. male returns to the neck today for follow-up visit.  The patient is feeling fine today with no concerning complaints except for the baseline shortness of breath increased with exertion.  He has no chest pain, cough or hemoptysis.  He has no nausea, vomiting, diarrhea or constipation.  He has no headache or visual changes.  He has no significant weight loss or night sweats.  He had repeat CT scan of the chest, abdomen and pelvis performed few weeks ago and he is here for evaluation before starting cycle #5 of his treatment.   MEDICAL HISTORY: Past Medical History:  Diagnosis Date   CHF (congestive heart failure) (HCC)    COPD (chronic obstructive pulmonary disease) ([place])    Coronary artery disease    Hypertension    lung ca dx'd 2005   chemo/xrt comp 2005, lung ca   Myocardial infarction (HCC)    Shortness of breath     ALLERGIES:  is allergic to penicillins.  MEDICATIONS:   Current Outpatient Medications  Medication Sig Dispense Refill   rosuvastatin (CRESTOR) 5 MG tablet Take 5 mg by mouth daily.     albuterol (PROVENTIL HFA;VENTOLIN HFA) 108 (90 BASE) MCG/ACT inhaler Inhale 2 puffs into the lungs every 4 (four) hours as needed for wheezing or shortness of breath. 1 Inhaler 0   aspirin 325 MG tablet Take 325 mg by mouth 2 (two) times a week.     budesonide-formoterol (SYMBICORT) 160-4.5 MCG/ACT inhaler Inhale 2 puffs into the lungs 2 (two) times daily.     carvedilol (COREG) 3.125 MG tablet Take 1 tablet (3.125 mg total) by mouth 2 (two) times daily with a meal. 60 tablet 0   Fluticasone-Umeclidin-Vilant (TRELEGY ELLIPTA IN) Inhale into the lungs.     furosemide (LASIX) 40 MG tablet Take 1 tablet (40 mg total) by mouth 2 (two) times daily. Twice a day for 3 days, then back to normal dosing. (Patient taking differently: Take 40 mg by mouth daily as needed for fluid. ) 3 tablet 0   guaiFENesin (MUCINEX) 600 MG 12 hr tablet Take 2 tablets (1,200 mg total) by mouth 2 (two) times daily. (Patient taking differently: Take 1,200 mg by mouth 2 (two) times daily as needed for cough or to loosen phlegm. ) 60 tablet 0   hydrochlorothiazide (HYDRODIURIL) 25 MG tablet Take 25 mg by mouth daily.     levothyroxine (SYNTHROID) 50 MCG tablet Take 1 tablet (50 mcg total) by mouth daily before breakfast. 30 tablet 2   lidocaine-prilocaine (EMLA) cream  Apply 1 Application topically as needed (To Port-a-Cath). 30 g 2   LORazepam (ATIVAN) 1 MG tablet Take 1 tablet (1 mg total) by mouth 2 (two) times daily as needed for anxiety. 5 tablet 0   meloxicam (MOBIC) 7.5 MG tablet Take 7.5 mg by mouth daily.     montelukast (SINGULAIR) 10 MG tablet Take 1 tablet (10 mg total) by mouth at bedtime.     nitroGLYCERIN (NITROSTAT) 0.4 MG SL tablet Place 1 tablet (0.4 mg total) under the tongue every 5 (five) minutes x 3 doses as needed for chest pain. 25 tablet 12   ondansetron (ZOFRAN ODT) 4 MG  disintegrating tablet Take 1 tablet (4 mg total) by mouth every 8 (eight) hours as needed for nausea or vomiting. 20 tablet 0   polyethylene glycol (MIRALAX) packet Take 17 g by mouth daily as needed for moderate constipation or severe constipation. 30 each 0   prasugrel (EFFIENT) 10 MG TABS tablet Take 0.5 tablets (5 mg total) by mouth daily.     ramipril (ALTACE) 1.25 MG capsule Take 1 capsule (1.25 mg total) by mouth daily. 30 capsule 0   spironolactone (ALDACTONE) 25 MG tablet Take 1 tablet (25 mg total) by mouth daily. 30 tablet 0   tiZANidine (ZANAFLEX) 2 MG tablet Take by mouth every 6 (six) hours as needed for muscle spasms.     traMADol (ULTRAM) 50 MG tablet Take 1 tablet (50 mg total) by mouth every 12 (twelve) hours as needed for severe pain. 15 tablet 0   No current facility-administered medications for this visit.    SURGICAL HISTORY:  Past Surgical History:  Procedure Laterality Date   CARDIAC CATHETERIZATION N/A 10/13/2015   Procedure: Left Heart Cath and Coronary Angiography;  Surgeon: [name], MD;  Location: [facility];  Service: Cardiovascular;  Laterality: N/A;   CORONARY ANGIOPLASTY WITH STENT PLACEMENT     LEFT HEART CATH AND CORONARY ANGIOGRAPHY N/A 01/03/2018   Procedure: LEFT HEART CATH AND CORONARY ANGIOGRAPHY;  Surgeon: [name], MD;  Location: [facility];  Service: Cardiovascular;  Laterality: N/A;   LEFT HEART CATHETERIZATION WITH CORONARY ANGIOGRAM N/A 04/07/2012   Procedure: LEFT HEART CATHETERIZATION WITH CORONARY ANGIOGRAM;  Surgeon: [name], MD;  Location: [facility];  Service: Cardiovascular;  Laterality: N/A;   PNEUMONECTOMY  2005   left   vocal cord surgery  2005    REVIEW OF SYSTEMS:  Constitutional: positive for fatigue Eyes: negative Ears, nose, mouth, throat, and face: negative Respiratory: positive for dyspnea on exertion Cardiovascular: negative Gastrointestinal:  negative Genitourinary:negative Integument/breast: negative Hematologic/lymphatic: negative Musculoskeletal:positive for arthralgias Neurological: negative Behavioral/Psych: negative Endocrine: negative Allergic/Immunologic: negative   PHYSICAL EXAMINATION: General appearance: alert, cooperative, fatigued, and no distress Head: Normocephalic, without obvious abnormality, atraumatic Neck: no adenopathy, no JVD, supple, symmetrical, trachea midline, and thyroid not enlarged, symmetric, no tenderness/mass/nodules Lymph nodes: Cervical, supraclavicular, and axillary nodes normal. Resp: diminished breath sounds LLL and LUL and dullness to percussion LLL and LUL Back: symmetric, no curvature. ROM normal. No CVA tenderness. Cardio: regular rate and rhythm, S1, S2 normal, no murmur, click, rub or gallop GI: soft, non-tender; bowel sounds normal; no masses,  no organomegaly Extremities: extremities normal, atraumatic, no cyanosis or edema Neurologic: Alert and oriented X 3, normal strength and tone. Normal symmetric reflexes. Normal coordination and gait  ECOG PERFORMANCE STATUS: 1 - Symptomatic but completely ambulatory  Blood pressure 100/61, pulse 80, temperature 97.6 F (36.4 C), temperature source Oral, resp. rate  16, weight 146 lb 4.8 oz (66.4 kg), SpO2 95 %.  LABORATORY DATA: Lab Results  Component Value Date   WBC 5.6 01/05/2023   HGB 13.4 01/05/2023   HCT 39.0 01/05/2023   MCV 87.1 01/05/2023   PLT 276 01/05/2023      Chemistry      Component Value Date/Time   NA 135 01/05/2023 0814   NA 133 (L) 12/14/2012 0942   K 4.3 01/05/2023 0814   K 4.5 12/14/2012 0942   CL 103 01/05/2023 0814   CL 101 12/14/2012 0942   CO2 24 01/05/2023 0814   CO2 24 12/14/2012 0942   BUN 30 (H) 01/05/2023 0814   BUN 21.8 12/14/2012 0942   CREATININE 1.39 (H) 01/05/2023 0814   CREATININE 1.0 12/14/2012 0942      Component Value Date/Time   CALCIUM 8.6 (L) 01/05/2023 0814   CALCIUM 9.0  12/14/2012 0942   ALKPHOS 81 01/05/2023 0814   ALKPHOS 108 12/14/2012 0942   AST 29 01/05/2023 0814   AST 31 12/14/2012 0942   ALT 22 01/05/2023 0814   ALT 42 12/14/2012 0942   BILITOT 0.5 01/05/2023 0814   BILITOT 0.63 12/14/2012 0942       RADIOGRAPHIC STUDIES: DG Chest Port 1 View  Result Date: 12/27/2022 CLINICAL DATA:  Lung cancer, tremors EXAM: PORTABLE CHEST 1 VIEW COMPARISON:  CT chest dated 12/16/2022 FINDINGS: Near complete opacification of the left hemithorax in the setting of left pneumonectomy. Patchy/nodular opacity in the medial right lower lung, corresponding to the spiculated right middle lobe nodule on recent CT. Adjacent fiducial markers. Interlobular septal thickening in the right lung, lower lung predominant, possibly radiation changes although lymphangitic carcinomatosis is not excluded when correlating with recent CT. No focal consolidation.  No pleural effusion or pneumothorax. Leftward cardiomediastinal shift. Left chest port terminates in the lower SVC. IMPRESSION: Status post left pneumonectomy. Known spiculated right middle lobe nodule with adjacent fiducial markers, better evaluated on recent CT. Superimposed interlobular septal thickening in the right lung, possibly radiation changes versus lymphangitic carcinomatosis, better visualized on CT. Electronically Signed   By: Julian Hy M.D.   On: 12/27/2022 08:09   CT Chest Wo Contrast  Result Date: 12/16/2022 CLINICAL DATA:  Non-small cell lung cancer restaging * Tracking Code: BO * EXAM: CT CHEST, ABDOMEN AND PELVIS WITHOUT CONTRAST TECHNIQUE: Multidetector CT imaging of the chest, abdomen and pelvis was performed following the standard protocol without IV contrast. RADIATION DOSE REDUCTION: This exam was performed according to the departmental dose-optimization program which includes automated exposure control, adjustment of the mA and/or kV according to patient size and/or use of iterative reconstruction  technique. COMPARISON:  Multiple exams, including 08/19/2022 CT examination from [facility], and also a report from a more recent CT scan from the same location dated 08/19/2022 FINDINGS: CT CHEST FINDINGS Cardiovascular: Left Port-A-Cath tip: SVC. Coronary, aortic arch, and branch vessel atherosclerotic vascular disease. Prominent main pulmonary artery, potentially reflecting pulmonary arterial hypertension. Mild cardiomegaly. Mitral valve calcification noted. Mediastinum/Nodes: Leftward shift due to left pneumonectomy. Lungs/Pleura: Left pneumonectomy. Severe emphysema on the right with chronic reticulonodular interstitial accentuation. Fiducials noted in the right middle lobe including adjacent to a irregularly marginated 2.0 by 1.8 by 2.0 cm pulmonary nodule. This was reported as 1.6 by 1.3 cm on the CT scan of 08/19/2022, but without direct axis to those images it is difficult to truly compare as measurements of spiculated nodules can yield differing results between different interpreters. On 01/16/2022  I measure the nodule at 2.1 by 1.8 by 1.3 cm. Some of the density on today's exam could well be therapy related (radiation pneumonitis) as well. There is also some clustered tree-in-bud nodularity in the right middle lobe for example on image 85 of series 506, probably from atypical infectious bronchiolitis, although surveillance is suggested. This clustered nodularity was not visible on 01/16/2022. Musculoskeletal: Left sixth rib osteotomy with crowding of other left ribs related to reduced left hemithoracic volume from pneumonectomy. Mild bilateral gynecomastia. CT ABDOMEN PELVIS FINDINGS Hepatobiliary: Unremarkable Pancreas: Unremarkable Spleen: Unremarkable Adrenals/Urinary Tract: Unremarkable Stomach/Bowel: Unremarkable Vascular/Lymphatic: Atherosclerosis is present, including aortoiliac atherosclerotic disease. Atheromatous plaque noted in the superior mesenteric artery and proximally  in both renal arteries. No pathologic adenopathy is appreciated. Reproductive: Unremarkable Other: No supplemental non-categorized findings. Musculoskeletal: Mild to moderate degenerative hip arthropathy. IMPRESSION: 1. The spiculated right middle lobe nodule measures 2.0 by 1.8 by 2.0 cm, previously 2.1 by 1.8 by 1.3 cm on 01/16/2022. Adjacent fiducials noted. This was reported as being somewhat smaller on an outside CT scan from 08/19/2022 (at 1.6 by 1.3 cm), although I do not have those images to directly compare. Some of the density on today's exam could well be therapy related (radiation pneumonitis) as well. 2. Clustered tree-in-bud nodularity in the right middle lobe, probably from atypical infectious bronchiolitis, surveillance suggested. 3. Left pneumonectomy with leftward shift of the mediastinum. 4. Prominent main pulmonary artery, potentially reflecting pulmonary arterial hypertension. Mild cardiomegaly. Mitral valve calcification noted. 5. Mild to moderate degenerative hip arthropathy. 6. Mild bilateral gynecomastia. 7. Severe emphysema with widespread reticulonodular interstitial accentuation in the remaining right lung, similar to prior exams. 8. Aortic atherosclerosis. Aortic Atherosclerosis (ICD10-I70.0). Electronically Signed   By: Van Clines M.D.   On: 12/16/2022 15:32   CT Abdomen Pelvis Wo Contrast  Result Date: 12/16/2022 CLINICAL DATA:  Non-small cell lung cancer restaging * Tracking Code: BO * EXAM: CT CHEST, ABDOMEN AND PELVIS WITHOUT CONTRAST TECHNIQUE: Multidetector CT imaging of the chest, abdomen and pelvis was performed following the standard protocol without IV contrast. RADIATION DOSE REDUCTION: This exam was performed according to the departmental dose-optimization program which includes automated exposure control, adjustment of the mA and/or kV according to patient size and/or use of iterative reconstruction technique. COMPARISON:  Multiple exams, including 08/19/2022 CT  examination from [facility], and also a report from a more recent CT scan from the same location dated 08/19/2022 FINDINGS: CT CHEST FINDINGS Cardiovascular: Left Port-A-Cath tip: SVC. Coronary, aortic arch, and branch vessel atherosclerotic vascular disease. Prominent main pulmonary artery, potentially reflecting pulmonary arterial hypertension. Mild cardiomegaly. Mitral valve calcification noted. Mediastinum/Nodes: Leftward shift due to left pneumonectomy. Lungs/Pleura: Left pneumonectomy. Severe emphysema on the right with chronic reticulonodular interstitial accentuation. Fiducials noted in the right middle lobe including adjacent to a irregularly marginated 2.0 by 1.8 by 2.0 cm pulmonary nodule. This was reported as 1.6 by 1.3 cm on the CT scan of 08/19/2022, but without direct axis to those images it is difficult to truly compare as measurements of spiculated nodules can yield differing results between different interpreters. On 01/16/2022 I measure the nodule at 2.1 by 1.8 by 1.3 cm. Some of the density on today's exam could well be therapy related (radiation pneumonitis) as well. There is also some clustered tree-in-bud nodularity in the right middle lobe for example on image 85 of series 506, probably from atypical infectious bronchiolitis, although surveillance is suggested. This clustered nodularity was not visible on 01/16/2022. Musculoskeletal:  Left sixth rib osteotomy with crowding of other left ribs related to reduced left hemithoracic volume from pneumonectomy. Mild bilateral gynecomastia. CT ABDOMEN PELVIS FINDINGS Hepatobiliary: Unremarkable Pancreas: Unremarkable Spleen: Unremarkable Adrenals/Urinary Tract: Unremarkable Stomach/Bowel: Unremarkable Vascular/Lymphatic: Atherosclerosis is present, including aortoiliac atherosclerotic disease. Atheromatous plaque noted in the superior mesenteric artery and proximally in both renal arteries. No pathologic adenopathy is appreciated.  Reproductive: Unremarkable Other: No supplemental non-categorized findings. Musculoskeletal: Mild to moderate degenerative hip arthropathy. IMPRESSION: 1. The spiculated right middle lobe nodule measures 2.0 by 1.8 by 2.0 cm, previously 2.1 by 1.8 by 1.3 cm on 01/16/2022. Adjacent fiducials noted. This was reported as being somewhat smaller on an outside CT scan from 08/19/2022 (at 1.6 by 1.3 cm), although I do not have those images to directly compare. Some of the density on today's exam could well be therapy related (radiation pneumonitis) as well. 2. Clustered tree-in-bud nodularity in the right middle lobe, probably from atypical infectious bronchiolitis, surveillance suggested. 3. Left pneumonectomy with leftward shift of the mediastinum. 4. Prominent main pulmonary artery, potentially reflecting pulmonary arterial hypertension. Mild cardiomegaly. Mitral valve calcification noted. 5. Mild to moderate degenerative hip arthropathy. 6. Mild bilateral gynecomastia. 7. Severe emphysema with widespread reticulonodular interstitial accentuation in the remaining right lung, similar to prior exams. 8. Aortic atherosclerosis. Aortic Atherosclerosis (ICD10-I70.0). Electronically Signed   By: Van Clines M.D.   On: 12/16/2022 15:32    ASSESSMENT AND PLAN: This is a very pleasant 67 years old African-American male with stage IV (T4, N3, M1 C) non-small cell lung cancer and March 2023 presented with right middle lobe and right upper lobe pulmonary nodules in addition to mediastinal and supraclavicular lymphadenopathy as well as suspicious splenic metastasis. The patient has a history for stage IIIa non-small cell lung cancer diagnosed in 2004 status post neoadjuvant concurrent chemoradiation followed by consolidation chemotherapy followed by left pneumonectomy and has been in observation for almost 19 years before he has the new diagnosis of lung cancer in 2023.  The patient started systemic chemotherapy with  carboplatin, paclitaxel and Keytruda for 4 cycles before switching to single agent Keytruda.  He is status post 4 cycles of the maintenance therapy and has been tolerating it fairly well. I recommended for the patient to proceed with cycle #5 today as planned. He had repeat CT scan of the chest, abdomen and pelvis performed recently.  I personally and independently reviewed the scan and discussed the result with the patient today. His scan showed no concerning findings for disease progression and he has stable spiculated right middle lobe lung nodule. I recommended for the patient to continue with his current treatment with immunotherapy with Keytruda with cycle #5 today. I will see him back for follow-up visit for follow-up visit in 3 weeks before the next cycle of his treatment. The patient was advised to call immediately if he has any concerning symptoms in the interval.  The patient voices understanding of current disease status and treatment options and is in agreement with the current care plan.  All questions were answered. The patient knows to call the clinic with any problems, questions or concerns. We can certainly see the patient much sooner if necessary.  The total time spent in the appointment was 30 minutes.  Disclaimer: This note was dictated with voice recognition software. Similar sounding words can inadvertently be transcribed and may not be corrected upon review.

## 2023-01-25 ENCOUNTER — Inpatient Hospital Stay: Payer: 59 | Attending: Internal Medicine

## 2023-01-25 ENCOUNTER — Inpatient Hospital Stay: Payer: 59

## 2023-01-25 ENCOUNTER — Other Ambulatory Visit: Payer: Self-pay

## 2023-01-25 ENCOUNTER — Inpatient Hospital Stay (HOSPITAL_BASED_OUTPATIENT_CLINIC_OR_DEPARTMENT_OTHER): Payer: 59 | Admitting: Internal Medicine

## 2023-01-25 ENCOUNTER — Other Ambulatory Visit: Payer: 59

## 2023-01-25 DIAGNOSIS — C3490 Malignant neoplasm of unspecified part of unspecified bronchus or lung: Secondary | ICD-10-CM

## 2023-01-25 DIAGNOSIS — C3491 Malignant neoplasm of unspecified part of right bronchus or lung: Secondary | ICD-10-CM | POA: Diagnosis not present

## 2023-01-25 DIAGNOSIS — Z5112 Encounter for antineoplastic immunotherapy: Secondary | ICD-10-CM | POA: Diagnosis present

## 2023-01-25 DIAGNOSIS — I110 Hypertensive heart disease with heart failure: Secondary | ICD-10-CM | POA: Insufficient documentation

## 2023-01-25 DIAGNOSIS — Z95828 Presence of other vascular implants and grafts: Secondary | ICD-10-CM

## 2023-01-25 DIAGNOSIS — Z79899 Other long term (current) drug therapy: Secondary | ICD-10-CM | POA: Insufficient documentation

## 2023-01-25 DIAGNOSIS — I509 Heart failure, unspecified: Secondary | ICD-10-CM | POA: Insufficient documentation

## 2023-01-25 LAB — CBC WITH DIFFERENTIAL (CANCER CENTER ONLY)
Abs Immature Granulocytes: 0.01 10*3/uL (ref 0.00–0.07)
Basophils Absolute: 0.1 10*3/uL (ref 0.0–0.1)
Basophils Relative: 1 %
Eosinophils Absolute: 0.2 10*3/uL (ref 0.0–0.5)
Eosinophils Relative: 3 %
HCT: 35 % — ABNORMAL LOW (ref 39.0–52.0)
Hemoglobin: 12.4 g/dL — ABNORMAL LOW (ref 13.0–17.0)
Immature Granulocytes: 0 %
Lymphocytes Relative: 15 %
Lymphs Abs: 0.9 10*3/uL (ref 0.7–4.0)
MCH: 30.5 pg (ref 26.0–34.0)
MCHC: 35.4 g/dL (ref 30.0–36.0)
MCV: 86.2 fL (ref 80.0–100.0)
Monocytes Absolute: 0.8 10*3/uL (ref 0.1–1.0)
Monocytes Relative: 12 %
Neutro Abs: 4.4 10*3/uL (ref 1.7–7.7)
Neutrophils Relative %: 69 %
Platelet Count: 233 10*3/uL (ref 150–400)
RBC: 4.06 MIL/uL — ABNORMAL LOW (ref 4.22–5.81)
RDW: 17.2 % — ABNORMAL HIGH (ref 11.5–15.5)
WBC Count: 6.3 10*3/uL (ref 4.0–10.5)
nRBC: 0 % (ref 0.0–0.2)

## 2023-01-25 LAB — CMP (CANCER CENTER ONLY)
ALT: 19 U/L (ref 0–44)
AST: 30 U/L (ref 15–41)
Albumin: 3.9 g/dL (ref 3.5–5.0)
Alkaline Phosphatase: 92 U/L (ref 38–126)
Anion gap: 8 (ref 5–15)
BUN: 40 mg/dL — ABNORMAL HIGH (ref 8–23)
CO2: 27 mmol/L (ref 22–32)
Calcium: 8.9 mg/dL (ref 8.9–10.3)
Chloride: 103 mmol/L (ref 98–111)
Creatinine: 1.7 mg/dL — ABNORMAL HIGH (ref 0.61–1.24)
GFR, Estimated: 44 mL/min — ABNORMAL LOW (ref >60)
Glucose, Bld: 73 mg/dL (ref 70–99)
Potassium: 4 mmol/L (ref 3.5–5.1)
Sodium: 138 mmol/L (ref 135–145)
Total Bilirubin: 0.4 mg/dL (ref 0.3–1.2)
Total Protein: 7.5 g/dL (ref 6.5–8.1)

## 2023-01-25 LAB — TSH: TSH: 82.239 u[IU]/mL — ABNORMAL HIGH (ref 0.350–4.500)

## 2023-01-25 MED ORDER — SODIUM CHLORIDE 0.9% FLUSH
10.0000 mL | INTRAVENOUS | Status: DC | PRN
  Administered 2023-01-25: 10 mL

## 2023-01-25 MED ORDER — HEPARIN SOD (PORK) LOCK FLUSH 100 UNIT/ML IV SOLN
500.0000 [IU] | Freq: Once | INTRAVENOUS | Status: AC | PRN
  Administered 2023-01-25: 500 [IU]

## 2023-01-25 MED ORDER — SODIUM CHLORIDE 0.9 % IV SOLN
200.0000 mg | Freq: Once | INTRAVENOUS | Status: AC
  Administered 2023-01-25: 200 mg via INTRAVENOUS
  Filled 2023-01-25: qty 8

## 2023-01-25 MED ORDER — SODIUM CHLORIDE 0.9% FLUSH
10.0000 mL | Freq: Once | INTRAVENOUS | Status: AC
  Administered 2023-01-25: 10 mL

## 2023-01-25 MED ORDER — SODIUM CHLORIDE 0.9 % IV SOLN: Freq: Once | INTRAVENOUS | Status: AC

## 2023-01-25 NOTE — Progress Notes (Signed)
Patient seen by MD today  Vitals are within treatment parameters.  Labs reviewed:labs are not within treatment parameters . Per Julien Nordmann ,it is ok to treat pt with Pembrolizumab and creatinine of 1.7.  Per physician team, patient is ready for treatment and there are NO modifications to the treatment plan.

## 2023-01-25 NOTE — Patient Instructions (Signed)
[facility]  Discharge Instructions: Thank you for choosing [facility] to provide your oncology and hematology care.   If you have a lab appointment with the Cancer Center, please go directly to the Cancer Center and check in at the registration area.   Wear comfortable clothing and clothing appropriate for easy access to any Portacath or PICC line.   We strive to give you quality time with your provider. You may need to reschedule your appointment if you arrive late (15 or more minutes).  Arriving late affects you and other patients whose appointments are after yours.  Also, if you miss three or more appointments without notifying the office, you may be dismissed from the clinic at the provider's discretion.      For prescription refill requests, have your pharmacy contact our office and allow 72 hours for refills to be completed.    Today you received the following chemotherapy and/or immunotherapy agents: Pembrolizumab      To help prevent nausea and vomiting after your treatment, we encourage you to take your nausea medication as directed.  BELOW ARE SYMPTOMS THAT SHOULD BE REPORTED IMMEDIATELY: *FEVER GREATER THAN 100.4 F (38 C) OR HIGHER *CHILLS OR SWEATING *NAUSEA AND VOMITING THAT IS NOT CONTROLLED WITH YOUR NAUSEA MEDICATION *UNUSUAL SHORTNESS OF BREATH *UNUSUAL BRUISING OR BLEEDING *URINARY PROBLEMS (pain or burning when urinating, or frequent urination) *BOWEL PROBLEMS (unusual diarrhea, constipation, pain near the anus) TENDERNESS IN MOUTH AND THROAT WITH OR WITHOUT PRESENCE OF ULCERS (sore throat, sores in mouth, or a toothache) UNUSUAL RASH, SWELLING OR PAIN  UNUSUAL VAGINAL DISCHARGE OR ITCHING   Items with * indicate a potential emergency and should be followed up as soon as possible or go to the Emergency Department if any problems should occur.  Please show the CHEMOTHERAPY ALERT CARD or IMMUNOTHERAPY ALERT CARD at  check-in to the Emergency Department and triage nurse.  Should you have questions after your visit or need to cancel or reschedule your appointment, please contact [facility]  Dept: [phone]  and follow the prompts.  Office hours are 8:00 a.m. to 4:30 p.m. Monday - Friday. Please note that voicemails left after 4:00 p.m. may not be returned until the following business day.  We are closed weekends and major holidays. You have access to a nurse at all times for urgent questions. Please call the main number to the clinic Dept: [phone] and follow the prompts.   For any non-urgent questions, you may also contact your provider using MyChart. We now offer e-Visits for anyone 18 and older to request care online for non-urgent symptoms. For details visit [email or web address].   Also download the MyChart app! Go to the app store, search "MyChart", open the app, select [facility], and log in with your MyChart username and password.  

## 2023-01-25 NOTE — Progress Notes (Signed)
[facility] [phone]) [phone]   Fax:([phone]) [phone]  OFFICE PROGRESS NOTE  System, Provider Not In No address on file  DIAGNOSIS: 1)  Stage IV (T4, N3, M1 C) non-small cell lung cancer and March 2023 presented with right middle lobe and right upper lobe pulmonary nodules in addition to mediastinal and supraclavicular lymphadenopathy as well as suspicious splenic metastasis.  2) The patient has a history for stage IIIa non-small cell lung cancer diagnosed in 2004 status post neoadjuvant concurrent chemoradiation followed by consolidation chemotherapy followed by left pneumonectomy and has been in observation for almost 19 years before he has the new diagnosis of lung cancer in 2023.   PRIOR THERAPY: Status post 4 cycles of systemic chemotherapy with carboplatin, paclitaxel and Keytruda at [facility] in [place].  CURRENT THERAPY: Maintenance treatment with Keytruda 200 Mg IV every 3 weeks status post 5 cycles.  INTERVAL HISTORY: Ronald [name] 67 y.o. male returns to the clinic today for follow-up visit.  The patient is feeling fine with no concerning complaints except for the baseline shortness of breath and he is currently on home oxygen.  He denied having any chest pain, cough or hemoptysis.  He has no nausea, vomiting, diarrhea or constipation.  He has no headache or visual changes.  He denied having any significant weight loss or night sweats.  He is here today for evaluation before starting cycle #6 of his treatment.  MEDICAL HISTORY: Past Medical History:  Diagnosis Date   CHF (congestive heart failure) (HCC)    COPD (chronic obstructive pulmonary disease) ([facility])    Coronary artery disease    Hypertension    lung ca dx'd 2005   chemo/xrt comp 2005, lung ca   Myocardial infarction (HCC)    Shortness of breath     ALLERGIES:  is allergic to penicillins.  MEDICATIONS:  Current Outpatient Medications  Medication Sig  Dispense Refill   albuterol (PROVENTIL HFA;VENTOLIN HFA) 108 (90 BASE) MCG/ACT inhaler Inhale 2 puffs into the lungs every 4 (four) hours as needed for wheezing or shortness of breath. 1 Inhaler 0   aspirin 325 MG tablet Take 325 mg by mouth 2 (two) times a week.     budesonide-formoterol (SYMBICORT) 160-4.5 MCG/ACT inhaler Inhale 2 puffs into the lungs 2 (two) times daily.     carvedilol (COREG) 3.125 MG tablet Take 1 tablet (3.125 mg total) by mouth 2 (two) times daily with a meal. 60 tablet 0   Fluticasone-Umeclidin-Vilant (TRELEGY ELLIPTA IN) Inhale into the lungs.     furosemide (LASIX) 40 MG tablet Take 1 tablet (40 mg total) by mouth 2 (two) times daily. Twice a day for 3 days, then back to normal dosing. (Patient taking differently: Take 40 mg by mouth daily as needed for fluid. ) 3 tablet 0   guaiFENesin (MUCINEX) 600 MG 12 hr tablet Take 2 tablets (1,200 mg total) by mouth 2 (two) times daily. (Patient taking differently: Take 1,200 mg by mouth 2 (two) times daily as needed for cough or to loosen phlegm. ) 60 tablet 0   hydrochlorothiazide (HYDRODIURIL) 25 MG tablet Take 25 mg by mouth daily.     levothyroxine (SYNTHROID) 50 MCG tablet Take 1 tablet (50 mcg total) by mouth daily before breakfast. 30 tablet 2   lidocaine-prilocaine (EMLA) cream Apply 1 Application topically as needed (To Port-a-Cath). 30 g 2   LORazepam (ATIVAN) 1 MG tablet Take 1 tablet (1 mg total) by mouth  2 (two) times daily as needed for anxiety. 5 tablet 0   meloxicam (MOBIC) 7.5 MG tablet Take 7.5 mg by mouth daily.     montelukast (SINGULAIR) 10 MG tablet Take 1 tablet (10 mg total) by mouth at bedtime.     nitroGLYCERIN (NITROSTAT) 0.4 MG SL tablet Place 1 tablet (0.4 mg total) under the tongue every 5 (five) minutes x 3 doses as needed for chest pain. 25 tablet 12   ondansetron (ZOFRAN ODT) 4 MG disintegrating tablet Take 1 tablet (4 mg total) by mouth every 8 (eight) hours as needed for nausea or vomiting. 20  tablet 0   polyethylene glycol (MIRALAX) packet Take 17 g by mouth daily as needed for moderate constipation or severe constipation. 30 each 0   prasugrel (EFFIENT) 10 MG TABS tablet Take 0.5 tablets (5 mg total) by mouth daily.     ramipril (ALTACE) 1.25 MG capsule Take 1 capsule (1.25 mg total) by mouth daily. 30 capsule 0   rosuvastatin (CRESTOR) 5 MG tablet Take 5 mg by mouth daily.     spironolactone (ALDACTONE) 25 MG tablet Take 1 tablet (25 mg total) by mouth daily. 30 tablet 0   tiZANidine (ZANAFLEX) 2 MG tablet Take by mouth every 6 (six) hours as needed for muscle spasms.     traMADol (ULTRAM) 50 MG tablet Take 1 tablet (50 mg total) by mouth every 12 (twelve) hours as needed for severe pain. 15 tablet 0   No current facility-administered medications for this visit.    SURGICAL HISTORY:  Past Surgical History:  Procedure Laterality Date   CARDIAC CATHETERIZATION N/A 10/13/2015   Procedure: Left Heart Cath and Coronary Angiography;  Surgeon: [name], MD;  Location: [facility];  Service: Cardiovascular;  Laterality: N/A;   CORONARY ANGIOPLASTY WITH STENT PLACEMENT     LEFT HEART CATH AND CORONARY ANGIOGRAPHY N/A 01/03/2018   Procedure: LEFT HEART CATH AND CORONARY ANGIOGRAPHY;  Surgeon: [name], MD;  Location: [facility];  Service: Cardiovascular;  Laterality: N/A;   LEFT HEART CATHETERIZATION WITH CORONARY ANGIOGRAM N/A 04/07/2012   Procedure: LEFT HEART CATHETERIZATION WITH CORONARY ANGIOGRAM;  Surgeon: [name], MD;  Location: [facility];  Service: Cardiovascular;  Laterality: N/A;   PNEUMONECTOMY  2005   left   vocal cord surgery  2005    REVIEW OF SYSTEMS:  A comprehensive review of systems was negative except for: Constitutional: positive for fatigue Respiratory: positive for dyspnea on exertion   PHYSICAL EXAMINATION: General appearance: alert, cooperative, fatigued, and no distress Head: Normocephalic, without obvious abnormality,  atraumatic Neck: no adenopathy, no JVD, supple, symmetrical, trachea midline, and thyroid not enlarged, symmetric, no tenderness/mass/nodules Lymph nodes: Cervical, supraclavicular, and axillary nodes normal. Resp: diminished breath sounds LLL and LUL and dullness to percussion LLL and LUL Back: symmetric, no curvature. ROM normal. No CVA tenderness. Cardio: regular rate and rhythm, S1, S2 normal, no murmur, click, rub or gallop GI: soft, non-tender; bowel sounds normal; no masses,  no organomegaly Extremities: extremities normal, atraumatic, no cyanosis or edema  ECOG PERFORMANCE STATUS: 1 - Symptomatic but completely ambulatory  Blood pressure 108/69, pulse 89, temperature 97.9 F (36.6 C), temperature source Temporal, resp. rate 17, height 5' (1.524 m), weight 148 lb 8 oz (67.4 kg), SpO2 98 %.  LABORATORY DATA: Lab Results  Component Value Date   WBC 6.3 01/25/2023   HGB 12.4 (L) 01/25/2023   HCT 35.0 (L) 01/25/2023   MCV 86.2 01/25/2023  PLT 233 01/25/2023      Chemistry      Component Value Date/Time   NA 135 01/05/2023 0814   NA 133 (L) 12/14/2012 0942   K 4.3 01/05/2023 0814   K 4.5 12/14/2012 0942   CL 103 01/05/2023 0814   CL 101 12/14/2012 0942   CO2 24 01/05/2023 0814   CO2 24 12/14/2012 0942   BUN 30 (H) 01/05/2023 0814   BUN 21.8 12/14/2012 0942   CREATININE 1.39 (H) 01/05/2023 0814   CREATININE 1.0 12/14/2012 0942      Component Value Date/Time   CALCIUM 8.6 (L) 01/05/2023 0814   CALCIUM 9.0 12/14/2012 0942   ALKPHOS 81 01/05/2023 0814   ALKPHOS 108 12/14/2012 0942   AST 29 01/05/2023 0814   AST 31 12/14/2012 0942   ALT 22 01/05/2023 0814   ALT 42 12/14/2012 0942   BILITOT 0.5 01/05/2023 0814   BILITOT 0.63 12/14/2012 0942       RADIOGRAPHIC STUDIES: DG Chest Port 1 View  Result Date: 12/27/2022 CLINICAL DATA:  Lung cancer, tremors EXAM: PORTABLE CHEST 1 VIEW COMPARISON:  CT chest dated 12/16/2022 FINDINGS: Near complete opacification of the  left hemithorax in the setting of left pneumonectomy. Patchy/nodular opacity in the medial right lower lung, corresponding to the spiculated right middle lobe nodule on recent CT. Adjacent fiducial markers. Interlobular septal thickening in the right lung, lower lung predominant, possibly radiation changes although lymphangitic carcinomatosis is not excluded when correlating with recent CT. No focal consolidation.  No pleural effusion or pneumothorax. Leftward cardiomediastinal shift. Left chest port terminates in the lower SVC. IMPRESSION: Status post left pneumonectomy. Known spiculated right middle lobe nodule with adjacent fiducial markers, better evaluated on recent CT. Superimposed interlobular septal thickening in the right lung, possibly radiation changes versus lymphangitic carcinomatosis, better visualized on CT. Electronically Signed   By: Julian Hy M.D.   On: 12/27/2022 08:09    ASSESSMENT AND PLAN: This is a very pleasant 67 years old African-American male with stage IV (T4, N3, M1 C) non-small cell lung cancer and March 2023 presented with right middle lobe and right upper lobe pulmonary nodules in addition to mediastinal and supraclavicular lymphadenopathy as well as suspicious splenic metastasis. The patient has a history for stage IIIa non-small cell lung cancer diagnosed in 2004 status post neoadjuvant concurrent chemoradiation followed by consolidation chemotherapy followed by left pneumonectomy and has been in observation for almost 19 years before he has the new diagnosis of lung cancer in 2023.  The patient started systemic chemotherapy with carboplatin, paclitaxel and Keytruda for 4 cycles before switching to single agent Keytruda.  He is status post 5 cycles of the maintenance therapy and has been tolerating it fairly well. The patient is feeling fine today with no concerning complaints except for the baseline shortness of breath and he is currently on home oxygen.. I recommended  for him to proceed with cycle #6 today as planned. He will come back for follow-up visit in 3 weeks for evaluation He was advised to call immediately if he has any other concerning symptoms in the interval. The patient voices understanding of current disease status and treatment options and is in agreement with the current care plan.  All questions were answered. The patient knows to call the clinic with any problems, questions or concerns. We can certainly see the patient much sooner if necessary.  The total time spent in the appointment was 20 minutes.  Disclaimer: This note was dictated with voice  recognition software. Similar sounding words can inadvertently be transcribed and may not be corrected upon review.

## 2023-01-26 LAB — T4: T4, Total: 4 ug/dL — ABNORMAL LOW (ref 4.5–12.0)

## 2023-01-27 ENCOUNTER — Emergency Department (HOSPITAL_COMMUNITY): Payer: 59

## 2023-01-27 ENCOUNTER — Encounter (HOSPITAL_COMMUNITY): Payer: Self-pay

## 2023-01-27 ENCOUNTER — Other Ambulatory Visit: Payer: Self-pay

## 2023-01-27 ENCOUNTER — Emergency Department (HOSPITAL_COMMUNITY)
Admission: EM | Admit: 2023-01-27 | Discharge: 2023-01-27 | Disposition: A | Discharge status: Home / Self Care | Payer: 59 | Attending: Emergency Medicine | Admitting: Emergency Medicine

## 2023-01-27 ENCOUNTER — Ambulatory Visit: Payer: 59 | Attending: Internal Medicine | Admitting: Internal Medicine

## 2023-01-27 ENCOUNTER — Encounter: Payer: Self-pay | Admitting: Internal Medicine

## 2023-01-27 VITALS — BP 104/69 | HR 90 | Ht 60.0 in | Wt 151.8 lb

## 2023-01-27 DIAGNOSIS — Z955 Presence of coronary angioplasty implant and graft: Secondary | ICD-10-CM | POA: Diagnosis not present

## 2023-01-27 DIAGNOSIS — J449 Chronic obstructive pulmonary disease, unspecified: Secondary | ICD-10-CM | POA: Diagnosis not present

## 2023-01-27 DIAGNOSIS — I509 Heart failure, unspecified: Secondary | ICD-10-CM | POA: Insufficient documentation

## 2023-01-27 DIAGNOSIS — Z79899 Other long term (current) drug therapy: Secondary | ICD-10-CM

## 2023-01-27 DIAGNOSIS — I2510 Atherosclerotic heart disease of native coronary artery without angina pectoris: Secondary | ICD-10-CM | POA: Insufficient documentation

## 2023-01-27 DIAGNOSIS — Z7982 Long term (current) use of aspirin: Secondary | ICD-10-CM | POA: Diagnosis not present

## 2023-01-27 DIAGNOSIS — I700 Atherosclerosis of aorta: Secondary | ICD-10-CM | POA: Diagnosis not present

## 2023-01-27 DIAGNOSIS — Z87891 Personal history of nicotine dependence: Secondary | ICD-10-CM | POA: Insufficient documentation

## 2023-01-27 DIAGNOSIS — R1033 Periumbilical pain: Secondary | ICD-10-CM | POA: Diagnosis present

## 2023-01-27 DIAGNOSIS — Z5181 Encounter for therapeutic drug level monitoring: Secondary | ICD-10-CM

## 2023-01-27 DIAGNOSIS — Z85118 Personal history of other malignant neoplasm of bronchus and lung: Secondary | ICD-10-CM | POA: Insufficient documentation

## 2023-01-27 DIAGNOSIS — I110 Hypertensive heart disease with heart failure: Secondary | ICD-10-CM | POA: Insufficient documentation

## 2023-01-27 DIAGNOSIS — Z7951 Long term (current) use of inhaled steroids: Secondary | ICD-10-CM | POA: Diagnosis not present

## 2023-01-27 LAB — CBC WITH DIFFERENTIAL/PLATELET
Abs Immature Granulocytes: 0.02 10*3/uL (ref 0.00–0.07)
Basophils Absolute: 0.1 10*3/uL (ref 0.0–0.1)
Basophils Relative: 1 %
Eosinophils Absolute: 0.3 10*3/uL (ref 0.0–0.5)
Eosinophils Relative: 4 %
HCT: 34.3 % — ABNORMAL LOW (ref 39.0–52.0)
Hemoglobin: 11.6 g/dL — ABNORMAL LOW (ref 13.0–17.0)
Immature Granulocytes: 0 %
Lymphocytes Relative: 17 %
Lymphs Abs: 1.1 10*3/uL (ref 0.7–4.0)
MCH: 29.9 pg (ref 26.0–34.0)
MCHC: 33.8 g/dL (ref 30.0–36.0)
MCV: 88.4 fL (ref 80.0–100.0)
Monocytes Absolute: 0.6 10*3/uL (ref 0.1–1.0)
Monocytes Relative: 9 %
Neutro Abs: 4.6 10*3/uL (ref 1.7–7.7)
Neutrophils Relative %: 69 %
Platelets: 235 10*3/uL (ref 150–400)
RBC: 3.88 MIL/uL — ABNORMAL LOW (ref 4.22–5.81)
RDW: 17.3 % — ABNORMAL HIGH (ref 11.5–15.5)
WBC: 6.6 10*3/uL (ref 4.0–10.5)
nRBC: 0 % (ref 0.0–0.2)

## 2023-01-27 LAB — URINALYSIS, ROUTINE W REFLEX MICROSCOPIC
Bilirubin Urine: NEGATIVE
Glucose, UA: NEGATIVE mg/dL
Hgb urine dipstick: NEGATIVE
Ketones, ur: NEGATIVE mg/dL
Leukocytes,Ua: NEGATIVE
Nitrite: NEGATIVE
Protein, ur: NEGATIVE mg/dL
Specific Gravity, Urine: 1.042 — ABNORMAL HIGH (ref 1.005–1.030)
pH: 5 (ref 5.0–8.0)

## 2023-01-27 LAB — COMPREHENSIVE METABOLIC PANEL
ALT: 52 U/L — ABNORMAL HIGH (ref 0–44)
AST: 83 U/L — ABNORMAL HIGH (ref 15–41)
Albumin: 3.6 g/dL (ref 3.5–5.0)
Alkaline Phosphatase: 98 U/L (ref 38–126)
Anion gap: 10 (ref 5–15)
BUN: 35 mg/dL — ABNORMAL HIGH (ref 8–23)
CO2: 21 mmol/L — ABNORMAL LOW (ref 22–32)
Calcium: 8.7 mg/dL — ABNORMAL LOW (ref 8.9–10.3)
Chloride: 104 mmol/L (ref 98–111)
Creatinine, Ser: 1.36 mg/dL — ABNORMAL HIGH (ref 0.61–1.24)
GFR, Estimated: 58 mL/min — ABNORMAL LOW (ref >60)
Glucose, Bld: 116 mg/dL — ABNORMAL HIGH (ref 70–99)
Potassium: 3.5 mmol/L (ref 3.5–5.1)
Sodium: 135 mmol/L (ref 135–145)
Total Bilirubin: 0.7 mg/dL (ref 0.3–1.2)
Total Protein: 7.1 g/dL (ref 6.5–8.1)

## 2023-01-27 LAB — TYPE AND SCREEN
ABO/RH(D): O POS
Antibody Screen: NEGATIVE

## 2023-01-27 LAB — LIPASE, BLOOD: Lipase: 37 U/L (ref 11–51)

## 2023-01-27 LAB — ABO/RH: ABO/RH(D): O POS

## 2023-01-27 MED ORDER — SODIUM CHLORIDE (PF) 0.9 % IJ SOLN
INTRAMUSCULAR | Status: AC
  Filled 2023-01-27: qty 50

## 2023-01-27 MED ORDER — ONDANSETRON HCL 4 MG/2ML IJ SOLN
4.0000 mg | Freq: Once | INTRAMUSCULAR | Status: AC
  Administered 2023-01-27: 4 mg via INTRAVENOUS
  Filled 2023-01-27: qty 2

## 2023-01-27 MED ORDER — IOHEXOL 350 MG/ML SOLN
80.0000 mL | Freq: Once | INTRAVENOUS | Status: AC | PRN
  Administered 2023-01-27: 80 mL via INTRAVENOUS

## 2023-01-27 MED ORDER — SODIUM CHLORIDE 0.9 % IV BOLUS
500.0000 mL | Freq: Once | INTRAVENOUS | Status: AC
  Administered 2023-01-27: 500 mL via INTRAVENOUS

## 2023-01-27 MED ORDER — FENTANYL CITRATE PF 50 MCG/ML IJ SOSY
100.0000 ug | PREFILLED_SYRINGE | Freq: Once | INTRAMUSCULAR | Status: AC
  Administered 2023-01-27: 100 ug via INTRAVENOUS
  Filled 2023-01-27: qty 2

## 2023-01-27 NOTE — ED Provider Notes (Signed)
[facility] DEPT Provider Note: Georgena Spurling, MD, FACEP  CSN: [id number] MRN: [id number] ARRIVAL: 01/27/23 at [facility]: [place]  Abdominal Pain   HISTORY OF PRESENT ILLNESS  01/27/23 1:51 AM Ronald [name] is a 67 y.o. male with lung cancer currently undergoing chemotherapy for lung cancer, last treatment yesterday.  He is here with the sudden onset of severe (10 out of 10) periumbilical abdominal pain that began about 20 minutes prior to arrival.  It is worse with palpation.  He was noted to have a blood pressure of 83/56 on arrival.  He is nauseated but he attributes this to his chemotherapy yesterday.    Past Medical History:  Diagnosis Date   CHF (congestive heart failure) (HCC)    COPD (chronic obstructive pulmonary disease) ([place])    Coronary artery disease    Hypertension    lung ca dx'd 2005   chemo/xrt comp 2005, lung ca   Myocardial infarction [facility])    Shortness of breath     Past Surgical History:  Procedure Laterality Date   CARDIAC CATHETERIZATION N/A 10/13/2015   Procedure: Left Heart Cath and Coronary Angiography;  Surgeon: [name], MD;  Location: [facility];  Service: Cardiovascular;  Laterality: N/A;   CORONARY ANGIOPLASTY WITH STENT PLACEMENT     LEFT HEART CATH AND CORONARY ANGIOGRAPHY N/A 01/03/2018   Procedure: LEFT HEART CATH AND CORONARY ANGIOGRAPHY;  Surgeon: [name], MD;  Location: [facility];  Service: Cardiovascular;  Laterality: N/A;   LEFT HEART CATHETERIZATION WITH CORONARY ANGIOGRAM N/A 04/07/2012   Procedure: LEFT HEART CATHETERIZATION WITH CORONARY ANGIOGRAM;  Surgeon: [name], MD;  Location: [facility] CATH LAB;  Service: Cardiovascular;  Laterality: N/A;   PNEUMONECTOMY  2005   left   vocal cord surgery  2005    Family History  Problem Relation Age of Onset   Hypertension Other    Diabetes Other     Social History   Tobacco Use   Smoking status: Former    Types: Cigarettes     Quit date: 09/30/2013    Years since quitting: 9.3   Smokeless tobacco: Never  Substance Use Topics   Alcohol use: No    Comment: former   Drug use: No    Types: Cocaine    Prior to Admission medications   Medication Sig Start Date End Date Taking? Authorizing Provider  albuterol (PROVENTIL HFA;VENTOLIN HFA) 108 (90 BASE) MCG/ACT inhaler Inhale 2 puffs into the lungs every 4 (four) hours as needed for wheezing or shortness of breath. 10/14/13  Yes Ward, Delice Bison, DO  aspirin EC 81 MG tablet Take 81 mg by mouth daily. Swallow whole.   Yes [provider]  budesonide-formoterol (SYMBICORT) 160-4.5 MCG/ACT inhaler Inhale 2 puffs into the lungs daily.   Yes [provider]  furosemide (LASIX) 40 MG tablet Take 1 tablet (40 mg total) by mouth 2 (two) times daily. Twice a day for 3 days, then back to normal dosing. Patient taking differently: Take 40 mg by mouth 2 (two) times daily. 05/02/15  Yes Withrow, Elyse Jarvis, FNP  gabapentin (NEURONTIN) 300 MG capsule Take 300 mg by mouth 3 (three) times daily.   Yes [provider]  hydrochlorothiazide (HYDRODIURIL) 25 MG tablet Take 25 mg by mouth daily. 06/29/22  Yes [provider]  levothyroxine (SYNTHROID) 75 MCG tablet Take 75 mcg by mouth daily. 01/19/23  Yes [provider]  lidocaine-prilocaine (EMLA) cream Apply 1 Application  topically as needed (To Port-a-Cath). 12/10/22  Yes Curt Bears, MD  nitroGLYCERIN (NITROSTAT) 0.4 MG SL tablet Place 1 tablet (0.4 mg total) under the tongue every 5 (five) minutes x 3 doses as needed for chest pain. 11/01/13  Yes [name], MD  ramipril (ALTACE) 2.5 MG capsule Take 2.5 mg by mouth daily. 10/05/22  Yes [provider]  rosuvastatin (CRESTOR) 5 MG tablet Take 5 mg by mouth daily. 12/03/22  Yes [provider]  Donnal Debar 200-62.5-25 MCG/ACT AEPB Take 1 puff by mouth daily. 12/02/22  Yes [provider]  zolpidem (AMBIEN) 10 MG  tablet Take 10 mg by mouth at bedtime. 01/21/23  Yes [provider]  carvedilol (COREG) 3.125 MG tablet Take 1 tablet (3.125 mg total) by mouth 2 (two) times daily with a meal. Patient not taking: Reported on 01/27/2023 07/06/15   Orson Eva, MD  guaiFENesin (MUCINEX) 600 MG 12 hr tablet Take 2 tablets (1,200 mg total) by mouth 2 (two) times daily. Patient not taking: Reported on 01/27/2023 01/31/17   Mendel Corning, MD  levothyroxine (SYNTHROID) 50 MCG tablet Take 1 tablet (50 mcg total) by mouth daily before breakfast. Patient not taking: Reported on 01/27/2023 12/16/22   Curt Bears, MD  LORazepam (ATIVAN) 1 MG tablet Take 1 tablet (1 mg total) by mouth 2 (two) times daily as needed for anxiety. Patient not taking: Reported on 01/27/2023 12/27/22   Wynona Dove A, DO  montelukast (SINGULAIR) 10 MG tablet Take 1 tablet (10 mg total) by mouth at bedtime. Patient not taking: Reported on 01/27/2023 05/02/15   Withrow, Elyse Jarvis, FNP  ondansetron (ZOFRAN ODT) 4 MG disintegrating tablet Take 1 tablet (4 mg total) by mouth every 8 (eight) hours as needed for nausea or vomiting. Patient not taking: Reported on 01/27/2023 01/31/17   Rai, Vernelle Emerald, MD  polyethylene glycol [facility]) packet Take 17 g by mouth daily as needed for moderate constipation or severe constipation. Patient not taking: Reported on 01/27/2023 01/31/17   Rai, Vernelle Emerald, MD  prasugrel (EFFIENT) 10 MG TABS tablet Take 0.5 tablets (5 mg total) by mouth daily. Patient not taking: Reported on 01/27/2023 01/04/18   Mariel Aloe, MD  ramipril (ALTACE) 1.25 MG capsule Take 1 capsule (1.25 mg total) by mouth daily. Patient not taking: Reported on 01/27/2023 01/05/18   Mariel Aloe, MD  spironolactone (ALDACTONE) 25 MG tablet Take 1 tablet (25 mg total) by mouth daily. Patient not taking: Reported on 01/27/2023 07/06/15   Orson Eva, MD  traMADol (ULTRAM) 50 MG tablet Take 1 tablet (50 mg total) by mouth every 12 (twelve) hours as needed for  severe pain. Patient not taking: Reported on 01/27/2023 01/31/17   Mendel Corning, MD    Allergies Penicillins   REVIEW OF SYSTEMS  Negative except as noted here or in the History of Present Illness.   PHYSICAL EXAMINATION  Initial Vital Signs Blood pressure (!) 83/56, pulse 71, temperature 98 F (36.7 C), resp. rate 18, SpO2 100 %.  Examination General: Well-developed, well-nourished male in no acute distress; appearance consistent with age of record HENT: normocephalic; atraumatic Eyes: Normal appearance Neck: supple Heart: regular rate and rhythm Lungs: clear to auscultation bilaterally Abdomen: soft; nondistended; periumbilical tenderness; pulsatile mass palpated; bowel sounds present Extremities: No deformity; full range of motion; pulses normal Neurologic: Awake, alert and oriented; motor function intact in all extremities and symmetric; no facial droop Skin: Warm and dry Psychiatric: Missing   RESULTS  Summary of  this visit's results, reviewed and interpreted by myself:   EKG Interpretation  Date/Time:    Ventricular Rate:    PR Interval:    QRS Duration:   QT Interval:    QTC Calculation:   R Axis:     Text Interpretation:         Laboratory Studies: Results for orders placed or performed during the hospital encounter of 01/27/23 (from the past 24 hour(s))  CBC with Differential     Status: Abnormal   Collection Time: 01/27/23  1:48 AM  Result Value Ref Range   WBC 6.6 4.0 - 10.5 K/uL   RBC 3.88 (L) 4.22 - 5.81 MIL/uL   Hemoglobin 11.6 (L) 13.0 - 17.0 g/dL   HCT 34.3 (L) 39.0 - 52.0 %   MCV 88.4 80.0 - 100.0 fL   MCH 29.9 26.0 - 34.0 pg   MCHC 33.8 30.0 - 36.0 g/dL   RDW 17.3 (H) 11.5 - 15.5 %   Platelets 235 150 - 400 K/uL   nRBC 0.0 0.0 - 0.2 %   Neutrophils Relative % 69 %   Neutro Abs 4.6 1.7 - 7.7 K/uL   Lymphocytes Relative 17 %   Lymphs Abs 1.1 0.7 - 4.0 K/uL   Monocytes Relative 9 %   Monocytes Absolute 0.6 0.1 - 1.0 K/uL    Eosinophils Relative 4 %   Eosinophils Absolute 0.3 0.0 - 0.5 K/uL   Basophils Relative 1 %   Basophils Absolute 0.1 0.0 - 0.1 K/uL   Immature Granulocytes 0 %   Abs Immature Granulocytes 0.02 0.00 - 0.07 K/uL  Comprehensive metabolic panel     Status: Abnormal   Collection Time: 01/27/23  1:48 AM  Result Value Ref Range   Sodium 135 135 - 145 mmol/L   Potassium 3.5 3.5 - 5.1 mmol/L   Chloride 104 98 - 111 mmol/L   CO2 21 (L) 22 - 32 mmol/L   Glucose, Bld 116 (H) 70 - 99 mg/dL   BUN 35 (H) 8 - 23 mg/dL   Creatinine, Ser 1.36 (H) 0.61 - 1.24 mg/dL   Calcium 8.7 (L) 8.9 - 10.3 mg/dL   Total Protein 7.1 6.5 - 8.1 g/dL   Albumin 3.6 3.5 - 5.0 g/dL   AST 83 (H) 15 - 41 U/L   ALT 52 (H) 0 - 44 U/L   Alkaline Phosphatase 98 38 - 126 U/L   Total Bilirubin 0.7 0.3 - 1.2 mg/dL   GFR, Estimated 58 (L) >60 mL/min   Anion gap 10 5 - 15  Lipase, blood     Status: None   Collection Time: 01/27/23  1:48 AM  Result Value Ref Range   Lipase 37 11 - 51 U/L  ABO/Rh     Status: None   Collection Time: 01/27/23  1:48 AM  Result Value Ref Range   ABO/RH(D)      O POS Performed at [facility], [place] [address], [place], [place] [address]   Type and screen     Status: None   Collection Time: 01/27/23  2:57 AM  Result Value Ref Range   ABO/RH(D) O POS    Antibody Screen NEG    Sample Expiration      01/30/2023,2359 Performed at [facility], [place] [address] [place], [place] [address]   Urinalysis, Routine w reflex microscopic -Urine, Clean Catch     Status: Abnormal   Collection Time: 01/27/23  5:34 AM  Result Value Ref Range  Color, Urine YELLOW YELLOW   APPearance CLEAR CLEAR   Specific Gravity, Urine 1.042 (H) 1.005 - 1.030   pH 5.0 5.0 - 8.0   Glucose, UA NEGATIVE NEGATIVE mg/dL   Hgb urine dipstick NEGATIVE NEGATIVE   Bilirubin Urine NEGATIVE NEGATIVE   Ketones, ur NEGATIVE NEGATIVE mg/dL   Protein, ur NEGATIVE NEGATIVE mg/dL   Nitrite  NEGATIVE NEGATIVE   Leukocytes,Ua NEGATIVE NEGATIVE   Imaging Studies: CT Angio Chest/Abd/Pel for Dissection W and/or Wo Contrast  Result Date: 01/27/2023 CLINICAL DATA:  Sudden onset periumbilical pain. Ongoing chemo for lung cancer. History of left pneumonectomy in 2005. Aortic aneurysm suspected. EXAM: CT ANGIOGRAPHY CHEST, ABDOMEN AND PELVIS TECHNIQUE: Non-contrast CT of the chest was initially obtained. Multidetector CT imaging through the chest, abdomen and pelvis was performed using the standard protocol during bolus administration of intravenous contrast. Multiplanar reconstructed images and MIPs were obtained and reviewed to evaluate the vascular anatomy. RADIATION DOSE REDUCTION: This exam was performed according to the departmental dose-optimization program which includes automated exposure control, adjustment of the mA and/or kV according to patient size and/or use of iterative reconstruction technique. CONTRAST:  6m OMNIPAQUE IOHEXOL 350 MG/ML SOLN COMPARISON:  CT chest abdomen and pelvis 12/16/2022 FINDINGS: CTA CHEST FINDINGS Cardiovascular: Preferential opacification of the thoracic aorta. No evidence of thoracic aortic aneurysm or dissection. Cardiomegaly. Dilated right ventricle with reflux of contrast into the hepatic veins compatible with elevated right heart pressures. Coronary stenting and coronary artery calcification. Mixed density aortic atherosclerotic calcification without significant narrowing. Small pericardial effusion. Left chest wall Port-A-Cath tip in the low SVC. Mediastinum/Nodes: Leftward deviation of the mediastinum secondary to left pneumonectomy. Unremarkable esophagus. 16 mm right hilar node (6/39) is unchanged from 10/14/2013. Lungs/Pleura: Left pneumonectomy. Diffuse interlobular septal thickening and patchy ground-glass opacities in the right lung. Fiducial markers noted next to an unchanged irregularly marginated 2.3 cm pulmonary nodule. There are adjacent  clustered micro nodules in the right middle lobe. No pneumothorax or pleural effusion. Musculoskeletal: Left sixth rib osteotomy. No acute fracture. No destructive osseous lesion. Review of the MIP images confirms the above findings. CTA ABDOMEN AND PELVIS FINDINGS VASCULAR Aorta: No aneurysm or dissection. Advanced mixed density atherosclerotic plaque throughout the abdominal aorta. Infrarenal abdominal aorta penetrating atherosclerotic ulcer (series 6/image 116) at the origin of the IMA with approximately 5 mm neck. The area of ulceration measures 5 x 11 by 13 mm. The maximum diameter of the aorta at the site of ulceration is 26 mm. No associated intramural hematoma. This was not present on the 2019 CT abdomen and pelvis with contrast not well evaluated on CT abdomen pelvis without contrast 12/16/2022. Celiac: Patent without evidence of aneurysm, dissection, vasculitis or significant stenosis. SMA: Patent without evidence of aneurysm, dissection, vasculitis or significant stenosis. Renals: Single bilateral renal arteries without aneurysm or dissection. Mixed density plaque causes moderate right and mild left renal artery narrowing. IMA: Patent. Inflow: No aneurysm or dissection. Mixed density atherosclerotic plaque causes multifocal areas of mild to moderate narrowing in the common and external iliac stent severe narrowing of the internal iliac. Veins: No obvious venous abnormality within the limitations of this arterial phase study. Review of the MIP images confirms the above findings. NON-VASCULAR Hepatobiliary: Unremarkable liver, gallbladder, biliary tree. Pancreas: Unremarkable. Spleen: Unremarkable. Adrenals/Urinary Tract: Normal adrenal glands. No urinary calculi or hydronephrosis. Unremarkable bladder. Stomach/Bowel: Normal caliber large and small bowel. No bowel wall thickening. Normal appendix. Lymphatic: No adenopathy. Reproductive: Unremarkable. Other: No free intraperitoneal fluid or air.  Musculoskeletal:  No acute fracture or destructive osseous lesion. Review of the MIP images confirms the above findings. IMPRESSION: 1. 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 The neck measures 5 mm and the area of ulceration measures 5 x 11 x 13 mm. The aorta measures 26 mm at the site of ulceration. No associated intramural hematoma. 2. Diffuse interlobular septal thickening and patchy ground-glass opacities in the right lung, favor edema versus infection. 3. Clustered tree-in-bud nodularity in the right middle lobe likely due to infectious bronchiolitis. This is unchanged from 12/16/2022. Continued attention on follow-up. 4. Unchanged 2.3 cm right upper lobe pulmonary nodule adjacent to the fiducial markers. 5. Left pneumonectomy. 6. Cardiomegaly and small pericardial effusion. Electronically Signed   By: Placido Sou M.D.   On: 01/27/2023 03:21    ED COURSE and MDM  Nursing notes, initial and subsequent vitals signs, including pulse oximetry, reviewed and interpreted by myself.  Vitals:   01/27/23 0430 01/27/23 0445 01/27/23 0500 01/27/23 0539  BP: 99/70 101/81 93/67 117/70  Pulse: 76 75 73 95  Resp: '19 18 20 18  '$ Temp:    (!) 97.5 F (36.4 C)  TempSrc:    Oral  SpO2: 100% 99% 100% 95%   Medications  sodium chloride (PF) 0.9 % injection (  Not Given 01/27/23 0233)  ondansetron (ZOFRAN) injection 4 mg (4 mg Intravenous Given 01/27/23 0214)  fentaNYL (SUBLIMAZE) injection 100 mcg (100 mcg Intravenous Given 01/27/23 0213)  sodium chloride 0.9 % bolus 500 mL (0 mLs Intravenous Stopped 01/27/23 0300)  iohexol (OMNIPAQUE) 350 MG/ML injection 80 mL (80 mLs Intravenous Contrast Given 01/27/23 0233)   5:47 AM CT scan shows an aortic ulceration.  This was discussed with Dr. Carlis Abbott of vascular surgery.  The patient is currently pain-free and Dr. Carlis Abbott states this can be followed up in the office as it is unlikely the cause of his acute pain.  The  patient has been able to ambulate without difficulty.  He has not vomited while in the ED.  His blood pressure improved to 117/70 after 500 mL fluid bolus.  I suspect his symptoms were due to a reaction to yesterday's chemotherapy as his laboratory studies and CT scan are otherwise reassuring.   PROCEDURES  Procedures   ED DIAGNOSES     ICD-10-CM   1. Periumbilical abdominal pain  R10.33     2. 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

## 2023-01-27 NOTE — Patient Instructions (Signed)
Medication Instructions:  Your physician recommends that you continue on your current medications as directed. Please refer to the Current Medication list given to you today.  *If you need a refill on your cardiac medications before your next appointment, please call your pharmacy*  Follow-Up: At [facility], you and your health needs are our priority.  As part of our continuing mission to provide you with exceptional heart care, we have created designated Provider Care Teams.  These Care Teams include your primary Cardiologist (physician) and Advanced Practice Providers (APPs -  Physician Assistants and Nurse Practitioners) who all work together to provide you with the care you need, when you need it.  We recommend signing up for the patient portal called "MyChart".  Sign up information is provided on this After Visit Summary.  MyChart is used to connect with patients for Virtual Visits (Telemedicine).  Patients are able to view lab/test results, encounter notes, upcoming appointments, etc.  Non-urgent messages can be sent to your provider as well.   To learn more about what you can do with MyChart, go to [email or web address].    Your next appointment:   3 month(s)  Provider:   Dr. Phineas Inches

## 2023-01-27 NOTE — ED Triage Notes (Signed)
Arrives EMS from home with periumbilical abdominal pain beginning ~ 20 minutes prior to arrival.   Lung cancer getting treatment chemo. Last treatment yesterday here at [facility].

## 2023-01-27 NOTE — Progress Notes (Signed)
Cardiology Office Note:    Date:  01/27/2023   ID:  Ronald [name], DOB October 01, 1956, MRN [id number]  PCP:  System, Provider Not In   [facility] Providers Cardiologist:  None     Referring MD: Jeanell Sparrow, DO   No chief complaint on file. Elevated troponin  History of Present Illness:    Ronald [name] is a 67 y.o. male with a hx of ischemic systolic HF, PCI-LAD (PCI x3 not well documented), chronically occluded Lcx,  HTN, PH, thyroid dx on synthroid, drug abuse, CKD stage II, former patient of Dr. Terrence Dupont with hx of  stage Iia non-small cell lung CA 2004 s/p L pneumonectomy, Stage IV non-small cell lung CA diagnosed 01/2022 s/p 4 cycles of carboplatin, paclitaxel and keytruda at [facility], he continues on maintenance Keytruda 200 mg IV q 3 weeks s/p 4 cycles referral from the ED in February for elevated troponin with no delta. His chief complaint was tremors. He has no CP. He went to the ED this AM for abdominal pain. His BP was low. He was given a bolus of fluids. CT scan shows an aortic ulceration. This was discussed with Dr. Carlis Abbott of vascular surgery. The patient is currently pain-free and Dr. Carlis Abbott states this can be followed up in the office as it is unlikely the cause of his acute pain.   He is on chronic O2. He notes after chemo he has a constellation of issues. He has chronic SOB. No PND/orthopnea. At times he has angina and he takes a nitro which helps.  It only lasts seconds.  His abdominal pain is better   He does not see Dr. Terrence Dupont.   He is being seen at [facility]. He has rheumatic mitral valve dx. He has severe MR. He also has severe pulmonary HTN. RVSP 77 mmHg.  Per their note: "We looked at Mr. Ronald [name] TEE in our valve conference.Unfortunately for him, we agree that this is not a "clippable" valve. The MVA is too small, but more importantly, it is a rheumatic valve with commisural fusion and his gradient would be unacceptably high after clip."  He has a FU  appt at [facility] coming up per patient, to discuss options for possible intervention for his rheumatic mitral valve  Cardiology Studies TEE 11/05/2022 at [facility]: EF 25-30%, reduced RV fxn did not specify, mild-mod AI, Rheumatic appearing mitral valve, severe broad mitral regurgitation. Mean  gradient across the MV of 3 mmHg, but valve looks stenotic w/ MVA 3D planimetry of 2.9 cm2, restricted PMVL. Difficult mitraclip anatomy given restricted leaflets, broad MR jet and small MVA at baseline, RVSP 65-70 mmHg  LHC/RHC 09/20/2022- PCWP: 13 mmHg  RA: 11/10 (mean 7) mmHg  RV: 76/7 (mean 12) mmHg  PA: 83/32 (mean 50) mmHg  Fick CO: 3.69 L/min  Fick CI: 2.29 L/min/m2  PVR: 10 WU  Left Main Mild distal disease with up to 20% stenosis. Gives off LAD, RI, and LCX branches.  RCA Dominant vessel with mild diffuse non-obstructive disease. Branches distally to rPDA and rPLA.  LAD Proximal 20-30% stenosis. Patent prox LAD stent with mild in-stent restenosis. There is 30-40% mid segment stenosis after the stent. Gives off several smaller Diag branches, which are free of significant disease.  RI There is 50-60% proximal segment stenosis. Proximal stent is patent with mild in-stent restenosis. Distal to the stent, there is 30-40% stenosis.    Circ  Small size vessel that has CTO in the proximal segment. Mid segment  stent is occluded.   LV LVEF 45%  Severe mitral regurgitation   TEE 08/09/2022- TTE showed moderately reduced RV function, moderate TR, moderate AI, severe MR  LHC 2019, moderate in-stent LAD restenosis as before. Left circumflex very small which is chronically occluded.   12/30/2017-TTE EF 50-55, mild AI, moderate MR;?rheumatic mitral valve , RVSP elevated 52 mmHg  TTE 01/2017: EF 45-50%; inferolateral/anterolateral WMA, mild AI, note mitral valve was rheumatic appearing  Past Medical History:  Diagnosis Date   CHF (congestive heart failure) (HCC)    COPD (chronic obstructive pulmonary disease) (HCC)     Coronary artery disease    Hypertension    lung ca dx'd 2005   chemo/xrt comp 2005, lung ca   Myocardial infarction [facility])    Shortness of breath     Past Surgical History:  Procedure Laterality Date   CARDIAC CATHETERIZATION N/A 10/13/2015   Procedure: Left Heart Cath and Coronary Angiography;  Surgeon: [name], MD;  Location: [place] CV LAB;  Service: Cardiovascular;  Laterality: N/A;   CORONARY ANGIOPLASTY WITH STENT PLACEMENT     LEFT HEART CATH AND CORONARY ANGIOGRAPHY N/A 01/03/2018   Procedure: LEFT HEART CATH AND CORONARY ANGIOGRAPHY;  Surgeon: [name], MD;  Location: [place] CV LAB;  Service: Cardiovascular;  Laterality: N/A;   LEFT HEART CATHETERIZATION WITH CORONARY ANGIOGRAM N/A 04/07/2012   Procedure: LEFT HEART CATHETERIZATION WITH CORONARY ANGIOGRAM;  Surgeon: [name], MD;  Location: [place] CATH LAB;  Service: Cardiovascular;  Laterality: N/A;   PNEUMONECTOMY  2005   left   vocal cord surgery  2005    Current Medications: Current Outpatient Medications on File Prior to Visit  Medication Sig Dispense Refill   albuterol (PROVENTIL HFA;VENTOLIN HFA) 108 (90 BASE) MCG/ACT inhaler Inhale 2 puffs into the lungs every 4 (four) hours as needed for wheezing or shortness of breath. 1 Inhaler 0   budesonide-formoterol (SYMBICORT) 160-4.5 MCG/ACT inhaler Inhale 2 puffs into the lungs daily.     furosemide (LASIX) 40 MG tablet Take 1 tablet (40 mg total) by mouth 2 (two) times daily. Twice a day for 3 days, then back to normal dosing. (Patient taking differently: Take 40 mg by mouth 2 (two) times daily.) 3 tablet 0   hydrochlorothiazide (HYDRODIURIL) 25 MG tablet Take 25 mg by mouth daily.     lidocaine-prilocaine (EMLA) cream Apply 1 Application topically as needed (To Port-a-Cath). 30 g 2   LORazepam (ATIVAN) 1 MG tablet Take 1 tablet (1 mg total) by mouth 2 (two) times daily as needed for anxiety. 5 tablet 0   montelukast (SINGULAIR) 10 MG tablet Take 1  tablet (10 mg total) by mouth at bedtime.     nitroGLYCERIN (NITROSTAT) 0.4 MG SL tablet Place 1 tablet (0.4 mg total) under the tongue every 5 (five) minutes x 3 doses as needed for chest pain. 25 tablet 12   ondansetron (ZOFRAN ODT) 4 MG disintegrating tablet Take 1 tablet (4 mg total) by mouth every 8 (eight) hours as needed for nausea or vomiting. 20 tablet 0   rosuvastatin (CRESTOR) 5 MG tablet Take 5 mg by mouth daily.     spironolactone (ALDACTONE) 25 MG tablet Take 1 tablet (25 mg total) by mouth daily. 30 tablet 0   levothyroxine (SYNTHROID) 50 MCG tablet Take 1 tablet (50 mcg total) by mouth daily before breakfast. (Patient not taking: Reported on 01/27/2023) 30 tablet 2   No current facility-administered medications on file prior to visit.  Allergies:   Penicillins   Social History   Socioeconomic History   Marital status: Married    Spouse name: Not on file   Number of children: Not on file   Years of education: Not on file   Highest education level: Not on file  Occupational History   Not on file  Tobacco Use   Smoking status: Former    Types: Cigarettes    Quit date: 09/30/2013    Years since quitting: 9.3   Smokeless tobacco: Never  Substance and Sexual Activity   Alcohol use: No    Comment: former   Drug use: No    Types: Cocaine   Sexual activity: Not Currently  Other Topics Concern   Not on file  Social History Narrative   Not on file   Social Determinants of Health   Financial Resource Strain: Not on file  Food Insecurity: Not on file  Transportation Needs: Not on file  Physical Activity: Not on file  Stress: Not on file  Social Connections: Not on file     Family History: The patient's family history includes Diabetes in an other family member; Hypertension in an other family member.  ROS:   Please see the history of present illness.     All other systems reviewed and are negative.  EKGs/Labs/Other Studies Reviewed:    The following  studies were reviewed today:   EKG:  EKG is  ordered today.  The ekg ordered today demonstrates   01/27/2023- NSR, non specific st-t wave changes with tremoring  Recent Labs: 12/27/2022: B Natriuretic Peptide 752.6; Magnesium 2.2 01/25/2023: TSH 82.239 01/27/2023: ALT 52; BUN 35; Creatinine, Ser 1.36; Hemoglobin 11.6; Platelets 235; Potassium 3.5; Sodium 135   Recent Lipid Panel    Component Value Date/Time   CHOL 160 10/11/2015 0232   TRIG 39 10/11/2015 0232   HDL 39 (L) 10/11/2015 0232   CHOLHDL 4.1 10/11/2015 0232   VLDL 8 10/11/2015 0232   LDLCALC 113 (H) 10/11/2015 0232     Risk Assessment/Calculations:         Physical Exam:    VS:   Vitals:   01/27/23 1024  BP: 104/69  Pulse: 90  SpO2: 98%     Wt Readings from Last 3 Encounters:  01/25/23 148 lb 8 oz (67.4 kg)  01/05/23 146 lb 4.8 oz (66.4 kg)  12/27/22 147 lb 4.3 oz (66.8 kg)     GEN: wearing O2, Well nourished, well developed in no acute distress HEENT: Normal NECK: No JVD; No carotid bruits LYMPHATICS: No lymphadenopathy CARDIAC: RRR, +holosystolic murmur, loud P2 RESPIRATORY: nl wob, no rales ABDOMEN: Soft, non-tender, non-distended MUSCULOSKELETAL:  No edema; No deformity  SKIN: Warm and dry NEUROLOGIC:  Alert and oriented x 3 PSYCHIATRIC:  Normal affect   ASSESSMENT:    Elevated Troponin: he denies angina, no ischemic changes on his EKG. No signs of ACS. He is on [place] which has association with myocarditis. Trops are not too elevated, have low suspicion of this. Further, he has no active pneumonitis, myositis etc. Mild LFT elevation.  He has Stage IV CA, but noted to be stable. Prognosis was projected to be >1 year.  Ischemic CM:  - continue spironolactone 25 mg daily - Bps mildly soft, may not tolerate BB, entresto - can discuss SGLT2 on return visit  CAD: stable. continue asa, continue crestor 5 mg daily  HTN: continue ramipirl 2.5 mg daily, continue spironolactone 25 mg daily, HCTz  25 mg daily  Rheumatic  mitral valve:  on lasix. Pending FU at [facility]:    In order of problems listed above:   Follow up in 3 months           Medication Adjustments/Labs and Tests Ordered: Current medicines are reviewed at length with the patient today.  Concerns regarding medicines are outlined above.  No orders of the defined types were placed in this encounter.  No orders of the defined types were placed in this encounter.   There are no Patient Instructions on file for this visit.   Signed, Janina Mayo, MD  01/27/2023 7:43 AM    [place]

## 2023-01-27 NOTE — ED Notes (Signed)
Called sister for transportation home

## 2023-01-27 NOTE — Discharge Instructions (Signed)
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  It does not appear to be dangerous or life-threatening at this time but needs to be observed over time.  Dr. Carlis Abbott would like to see you in his office at your convenience.

## 2023-01-28 ENCOUNTER — Telehealth: Payer: Self-pay | Admitting: Vascular Surgery

## 2023-01-28 NOTE — Telephone Encounter (Signed)
-----   Message from Marty Heck, MD sent at 01/27/2023  5:22 AM EDT ----- Can you get this patient a clinic appointment for evaluation of PAU?  No studies.  Had CTA at [facility] long ed.  Thanks,  Gerald Stabs

## 2023-02-04 ENCOUNTER — Other Ambulatory Visit: Payer: Self-pay

## 2023-02-04 ENCOUNTER — Encounter (HOSPITAL_COMMUNITY): Payer: Self-pay | Admitting: Emergency Medicine

## 2023-02-04 ENCOUNTER — Emergency Department (HOSPITAL_COMMUNITY): Payer: 59

## 2023-02-04 ENCOUNTER — Inpatient Hospital Stay (HOSPITAL_COMMUNITY)
Admission: EM | Admit: 2023-02-04 | Discharge: 2023-02-10 | DRG: 193 | Disposition: A | Discharge status: Home Health | Payer: 59 | Attending: Internal Medicine | Admitting: Internal Medicine

## 2023-02-04 DIAGNOSIS — I5043 Acute on chronic combined systolic (congestive) and diastolic (congestive) heart failure: Secondary | ICD-10-CM | POA: Diagnosis present

## 2023-02-04 DIAGNOSIS — Z955 Presence of coronary angioplasty implant and graft: Secondary | ICD-10-CM

## 2023-02-04 DIAGNOSIS — Z87891 Personal history of nicotine dependence: Secondary | ICD-10-CM

## 2023-02-04 DIAGNOSIS — R5381 Other malaise: Secondary | ICD-10-CM | POA: Diagnosis present

## 2023-02-04 DIAGNOSIS — G893 Neoplasm related pain (acute) (chronic): Secondary | ICD-10-CM | POA: Diagnosis present

## 2023-02-04 DIAGNOSIS — E871 Hypo-osmolality and hyponatremia: Secondary | ICD-10-CM | POA: Diagnosis present

## 2023-02-04 DIAGNOSIS — I272 Pulmonary hypertension, unspecified: Secondary | ICD-10-CM | POA: Diagnosis present

## 2023-02-04 DIAGNOSIS — I080 Rheumatic disorders of both mitral and aortic valves: Secondary | ICD-10-CM | POA: Diagnosis present

## 2023-02-04 DIAGNOSIS — Z79890 Hormone replacement therapy (postmenopausal): Secondary | ICD-10-CM

## 2023-02-04 DIAGNOSIS — I2510 Atherosclerotic heart disease of native coronary artery without angina pectoris: Secondary | ICD-10-CM | POA: Diagnosis present

## 2023-02-04 DIAGNOSIS — Z7189 Other specified counseling: Secondary | ICD-10-CM

## 2023-02-04 DIAGNOSIS — Z79899 Other long term (current) drug therapy: Secondary | ICD-10-CM

## 2023-02-04 DIAGNOSIS — J189 Pneumonia, unspecified organism: Principal | ICD-10-CM | POA: Diagnosis present

## 2023-02-04 DIAGNOSIS — J439 Emphysema, unspecified: Secondary | ICD-10-CM | POA: Diagnosis present

## 2023-02-04 DIAGNOSIS — E039 Hypothyroidism, unspecified: Secondary | ICD-10-CM | POA: Diagnosis present

## 2023-02-04 DIAGNOSIS — Z515 Encounter for palliative care: Secondary | ICD-10-CM

## 2023-02-04 DIAGNOSIS — C7800 Secondary malignant neoplasm of unspecified lung: Secondary | ICD-10-CM | POA: Diagnosis present

## 2023-02-04 DIAGNOSIS — Z1152 Encounter for screening for COVID-19: Secondary | ICD-10-CM

## 2023-02-04 DIAGNOSIS — Z66 Do not resuscitate: Secondary | ICD-10-CM | POA: Diagnosis present

## 2023-02-04 DIAGNOSIS — Z9981 Dependence on supplemental oxygen: Secondary | ICD-10-CM

## 2023-02-04 DIAGNOSIS — K5900 Constipation, unspecified: Secondary | ICD-10-CM | POA: Diagnosis present

## 2023-02-04 DIAGNOSIS — I2489 Other forms of acute ischemic heart disease: Secondary | ICD-10-CM | POA: Diagnosis present

## 2023-02-04 DIAGNOSIS — Z902 Acquired absence of lung [part of]: Secondary | ICD-10-CM

## 2023-02-04 DIAGNOSIS — E782 Mixed hyperlipidemia: Secondary | ICD-10-CM | POA: Diagnosis present

## 2023-02-04 DIAGNOSIS — Z7982 Long term (current) use of aspirin: Secondary | ICD-10-CM

## 2023-02-04 DIAGNOSIS — Z833 Family history of diabetes mellitus: Secondary | ICD-10-CM

## 2023-02-04 DIAGNOSIS — Z7951 Long term (current) use of inhaled steroids: Secondary | ICD-10-CM

## 2023-02-04 DIAGNOSIS — Z5941 Food insecurity: Secondary | ICD-10-CM

## 2023-02-04 DIAGNOSIS — R0602 Shortness of breath: Secondary | ICD-10-CM

## 2023-02-04 DIAGNOSIS — N179 Acute kidney failure, unspecified: Secondary | ICD-10-CM | POA: Diagnosis present

## 2023-02-04 DIAGNOSIS — R4589 Other symptoms and signs involving emotional state: Secondary | ICD-10-CM

## 2023-02-04 DIAGNOSIS — K219 Gastro-esophageal reflux disease without esophagitis: Secondary | ICD-10-CM | POA: Diagnosis present

## 2023-02-04 DIAGNOSIS — J440 Chronic obstructive pulmonary disease with acute lower respiratory infection: Secondary | ICD-10-CM | POA: Diagnosis present

## 2023-02-04 DIAGNOSIS — I340 Nonrheumatic mitral (valve) insufficiency: Secondary | ICD-10-CM | POA: Diagnosis present

## 2023-02-04 DIAGNOSIS — Z85118 Personal history of other malignant neoplasm of bronchus and lung: Secondary | ICD-10-CM

## 2023-02-04 DIAGNOSIS — J441 Chronic obstructive pulmonary disease with (acute) exacerbation: Secondary | ICD-10-CM | POA: Diagnosis present

## 2023-02-04 DIAGNOSIS — Z8249 Family history of ischemic heart disease and other diseases of the circulatory system: Secondary | ICD-10-CM

## 2023-02-04 DIAGNOSIS — R0682 Tachypnea, not elsewhere classified: Principal | ICD-10-CM

## 2023-02-04 DIAGNOSIS — I10 Essential (primary) hypertension: Secondary | ICD-10-CM | POA: Diagnosis present

## 2023-02-04 DIAGNOSIS — M25562 Pain in left knee: Secondary | ICD-10-CM | POA: Diagnosis present

## 2023-02-04 DIAGNOSIS — I255 Ischemic cardiomyopathy: Secondary | ICD-10-CM | POA: Diagnosis present

## 2023-02-04 DIAGNOSIS — I252 Old myocardial infarction: Secondary | ICD-10-CM

## 2023-02-04 DIAGNOSIS — Z880 Allergy status to penicillin: Secondary | ICD-10-CM

## 2023-02-04 DIAGNOSIS — G4700 Insomnia, unspecified: Secondary | ICD-10-CM | POA: Diagnosis present

## 2023-02-04 DIAGNOSIS — N1831 Chronic kidney disease, stage 3a: Secondary | ICD-10-CM | POA: Diagnosis present

## 2023-02-04 DIAGNOSIS — J9611 Chronic respiratory failure with hypoxia: Secondary | ICD-10-CM | POA: Diagnosis present

## 2023-02-04 DIAGNOSIS — I130 Hypertensive heart and chronic kidney disease with heart failure and stage 1 through stage 4 chronic kidney disease, or unspecified chronic kidney disease: Secondary | ICD-10-CM | POA: Diagnosis present

## 2023-02-04 DIAGNOSIS — I5022 Chronic systolic (congestive) heart failure: Secondary | ICD-10-CM | POA: Diagnosis present

## 2023-02-04 LAB — CBC
HCT: 38.5 % — ABNORMAL LOW (ref 39.0–52.0)
Hemoglobin: 12.8 g/dL — ABNORMAL LOW (ref 13.0–17.0)
MCH: 29.8 pg (ref 26.0–34.0)
MCHC: 33.2 g/dL (ref 30.0–36.0)
MCV: 89.7 fL (ref 80.0–100.0)
Platelets: 306 10*3/uL (ref 150–400)
RBC: 4.29 MIL/uL (ref 4.22–5.81)
RDW: 17.7 % — ABNORMAL HIGH (ref 11.5–15.5)
WBC: 6.7 10*3/uL (ref 4.0–10.5)
nRBC: 0 % (ref 0.0–0.2)

## 2023-02-04 LAB — BASIC METABOLIC PANEL
Anion gap: 10 (ref 5–15)
BUN: 20 mg/dL (ref 8–23)
CO2: 18 mmol/L — ABNORMAL LOW (ref 22–32)
Calcium: 9 mg/dL (ref 8.9–10.3)
Chloride: 106 mmol/L (ref 98–111)
Creatinine, Ser: 1.34 mg/dL — ABNORMAL HIGH (ref 0.61–1.24)
GFR, Estimated: 59 mL/min — ABNORMAL LOW (ref >60)
Glucose, Bld: 96 mg/dL (ref 70–99)
Potassium: 4 mmol/L (ref 3.5–5.1)
Sodium: 134 mmol/L — ABNORMAL LOW (ref 135–145)

## 2023-02-04 LAB — BRAIN NATRIURETIC PEPTIDE: B Natriuretic Peptide: 754.7 pg/mL — ABNORMAL HIGH (ref 0.0–100.0)

## 2023-02-04 LAB — TROPONIN I (HIGH SENSITIVITY)
Troponin I (High Sensitivity): 56 ng/L — ABNORMAL HIGH (ref <18)
Troponin I (High Sensitivity): 59 ng/L — ABNORMAL HIGH (ref <18)

## 2023-02-04 MED ORDER — FENTANYL CITRATE PF 50 MCG/ML IJ SOSY
50.0000 ug | PREFILLED_SYRINGE | Freq: Once | INTRAMUSCULAR | Status: AC
  Administered 2023-02-04: 50 ug via INTRAVENOUS
  Filled 2023-02-04: qty 1

## 2023-02-04 MED ORDER — IOHEXOL 350 MG/ML SOLN
75.0000 mL | Freq: Once | INTRAVENOUS | Status: AC | PRN
  Administered 2023-02-04: 75 mL via INTRAVENOUS

## 2023-02-04 NOTE — ED Provider Notes (Incomplete)
  Physical Exam  BP 111/87   Pulse 86   Temp 97.9 F (36.6 C) (Oral)   Resp 19   SpO2 94%   Physical Exam  Procedures  Procedures  ED Course / MDM   Clinical Course as of 02/04/23 2350  Fri Feb 04, 2023  2325 Coughing more than usual producing sputum.  Wears 2L at home.  More SOB Sees Dr[facility].  [WF]    Clinical Course User Index [WF] Tedd Sias, [place]   Medical Decision Making Amount and/or Complexity of Data Reviewed Labs: ordered. Radiology: ordered.  Risk Prescription drug management.   ***

## 2023-02-04 NOTE — ED Provider Notes (Signed)
Accepted handoff at shift change from [employer]. Please see prior provider note for more detail.   Briefly: Patient is 67 y.o.   Patient is a 67 year old male with metastatic lung cancer status post left pneumonectomy in 2005 but now has recurrent lung cancer has a history of CHF COPD hypertension presents emergency room today with complaining of chest pain shortness of breath that began yesterday has been present throughout the day.  He also states that he has been coughing and producing some more sputum over the past few days.  Denies any fevers.  He has been significantly more dyspneic.   Plan: Admit    Physical Exam  BP 111/87   Pulse 86   Temp 97.9 F (36.6 C) (Oral)   Resp 19   SpO2 94%   Physical Exam Vitals and nursing note reviewed.  Constitutional:      Appearance: He is ill-appearing.  HENT:     Head: Normocephalic and atraumatic.     Nose: Nose normal.  Eyes:     General: No scleral icterus. Cardiovascular:     Rate and Rhythm: Normal rate and regular rhythm.     Pulses: Normal pulses.     Heart sounds: Normal heart sounds.  Pulmonary:     Effort: Pulmonary effort is normal. No respiratory distress.     Breath sounds: No wheezing.     Comments: Tachypnea, absent lung sounds on left side Abdominal:     Palpations: Abdomen is soft.     Tenderness: There is no abdominal tenderness.  Musculoskeletal:     Cervical back: Normal range of motion.     Right lower leg: No edema.     Left lower leg: No edema.  Skin:    General: Skin is warm and dry.     Capillary Refill: Capillary refill takes less than 2 seconds.  Neurological:     Mental Status: He is alert. Mental status is at baseline.  Psychiatric:        Mood and Affect: Mood normal.        Behavior: Behavior normal.     Procedures  Procedures  ED Course / MDM   Clinical Course as of 02/04/23 2350  Fri Feb 04, 2023  2325 Coughing more than usual producing sputum.  Wears 2L at home.  More  SOB Sees Dr. Jerilynn Mages at [facility].  [WF]    Clinical Course User Index [WF] Tedd Sias, [place]   Medical Decision Making Amount and/or Complexity of Data Reviewed Labs: ordered. Radiology: ordered.  Risk Prescription drug management. Decision regarding hospitalization.   Cancer patient with PNA and tachypnea   Troponin is normal here, discussed with hospitalist Dr. Claria Dice who will admit     Tedd Sias, [place] 02/05/23 0517    Fatima Blank, MD 02/05/23 [phone]

## 2023-02-04 NOTE — ED Provider Notes (Signed)
[place] EMERGENCY DEPARTMENT AT [facility] Provider Note   CSN: [id number] Arrival date & time: 02/04/23  1927     History  Chief Complaint  Patient presents with   Chest Pain    Ronald [name] is a 67 y.o. male with history of CAD, lung cancer s/p left pneumonectomy in 2005, mitral regurgitation, CHF, COPD, hypertension who presents the emergency department complaining of chest pain and shortness of breath starting earlier this afternoon.  Patient currently on chemotherapy, follows with Dr. Julien Nordmann.  States that symptoms started a few days ago, worsening over the past several hours.  Pain is mostly localized to the left side of his chest, as well as some generalized abdominal pain.  Describes it as pressure, worse with exertion.  Does not improve after rest.  Has been feeling more short of breath recently as well.  Only has oxygen at home when he needs it, but feels he has been needing it.  Chronic cough is getting more frequent, productive.  He overall feels weak, and that he does not feel well.   Chest Pain Associated symptoms: cough, fatigue, shortness of breath and weakness        Home Medications Prior to Admission medications   Medication Sig Start Date End Date Taking? Authorizing Provider  albuterol (PROVENTIL HFA;VENTOLIN HFA) 108 (90 BASE) MCG/ACT inhaler Inhale 2 puffs into the lungs every 4 (four) hours as needed for wheezing or shortness of breath. 10/14/13   Ward, Delice Bison, DO  aspirin EC 81 MG tablet Take 81 mg by mouth daily. Swallow whole.    [provider]  budesonide-formoterol (SYMBICORT) 160-4.5 MCG/ACT inhaler Inhale 2 puffs into the lungs daily.    [provider]  furosemide (LASIX) 40 MG tablet Take 1 tablet (40 mg total) by mouth 2 (two) times daily. Twice a day for 3 days, then back to normal dosing. Patient taking differently: Take 40 mg by mouth 2 (two) times daily. 05/02/15   Withrow, Elyse Jarvis, FNP  gabapentin  (NEURONTIN) 300 MG capsule Take 300 mg by mouth 3 (three) times daily.    [provider]  hydrochlorothiazide (HYDRODIURIL) 25 MG tablet Take 25 mg by mouth daily. 06/29/22   [provider]  levothyroxine (SYNTHROID) 50 MCG tablet Take 1 tablet (50 mcg total) by mouth daily before breakfast. Patient not taking: Reported on 01/27/2023 12/16/22   Curt Bears, MD  levothyroxine (SYNTHROID) 75 MCG tablet Take 75 mcg by mouth daily. 01/19/23   [provider]  lidocaine-prilocaine (EMLA) cream Apply 1 Application topically as needed (To Port-a-Cath). 12/10/22   Curt Bears, MD  LORazepam (ATIVAN) 1 MG tablet Take 1 tablet (1 mg total) by mouth 2 (two) times daily as needed for anxiety. 12/27/22   Jeanell Sparrow, DO  montelukast (SINGULAIR) 10 MG tablet Take 1 tablet (10 mg total) by mouth at bedtime. 05/02/15   Withrow, Elyse Jarvis, FNP  nitroGLYCERIN (NITROSTAT) 0.4 MG SL tablet Place 1 tablet (0.4 mg total) under the tongue every 5 (five) minutes x 3 doses as needed for chest pain. 11/01/13   [name], MD  ondansetron (ZOFRAN ODT) 4 MG disintegrating tablet Take 1 tablet (4 mg total) by mouth every 8 (eight) hours as needed for nausea or vomiting. 01/31/17   Rai, Ripudeep K, MD  ramipril (ALTACE) 2.5 MG capsule Take 2.5 mg by mouth daily. 10/05/22   [provider]  rosuvastatin (CRESTOR) 5 MG tablet Take 5 mg by mouth daily. 12/03/22  [provider]  spironolactone (ALDACTONE) 25 MG tablet Take 1 tablet (25 mg total) by mouth daily. 07/06/15   Orson Eva, MD  TRELEGY ELLIPTA 200-62.5-25 MCG/ACT AEPB Take 1 puff by mouth daily. 12/02/22   [provider]  zolpidem (AMBIEN) 10 MG tablet Take 10 mg by mouth at bedtime. 01/21/23   [provider]      Allergies    Penicillins    Review of Systems   Review of Systems  Constitutional:  Positive for fatigue.  Respiratory:  Positive for cough and shortness of breath.   Cardiovascular:   Positive for chest pain.  Neurological:  Positive for weakness.  All other systems reviewed and are negative.   Physical Exam Updated Vital Signs BP 120/79   Pulse 99   Temp (!) 97.4 F (36.3 C) (Oral)   Resp (!) 21   SpO2 91%  Physical Exam Vitals and nursing note reviewed.  Constitutional:      Appearance: Normal appearance.  HENT:     Head: Normocephalic and atraumatic.  Eyes:     Conjunctiva/sclera: Conjunctivae normal.  Cardiovascular:     Rate and Rhythm: Normal rate and regular rhythm.  Pulmonary:     Effort: Pulmonary effort is normal. Tachypnea present. No respiratory distress.     Comments: On 2 L nasal cannula.  Turned off during my evaluation, maintain normal oxygen saturations.  Absent lung sounds on the left s/p pneumonectomy Abdominal:     General: There is no distension.     Palpations: Abdomen is soft.     Tenderness: There is no abdominal tenderness.  Musculoskeletal:     Right lower leg: No edema.     Left lower leg: No edema.  Skin:    General: Skin is warm and dry.  Neurological:     General: No focal deficit present.     Mental Status: He is alert.     ED Results / Procedures / Treatments   Labs (all labs ordered are listed, but only abnormal results are displayed) Labs Reviewed  BASIC METABOLIC PANEL - Abnormal; Notable for the following components:      Result Value   Sodium 134 (*)    CO2 18 (*)    Creatinine, Ser 1.34 (*)    GFR, Estimated 59 (*)    All other components within normal limits  CBC - Abnormal; Notable for the following components:   Hemoglobin 12.8 (*)    HCT 38.5 (*)    RDW 17.7 (*)    All other components within normal limits  TROPONIN I (HIGH SENSITIVITY) - Abnormal; Notable for the following components:   Troponin I (High Sensitivity) 56 (*)    All other components within normal limits  BRAIN NATRIURETIC PEPTIDE  TROPONIN I (HIGH SENSITIVITY)    EKG EKG Interpretation  Date/Time:  Friday February 04 2023  19:52:44 EDT Ventricular Rate:  92 PR Interval:  183 QRS Duration: 138 QT Interval:  398 QTC Calculation: 493 R Axis:   216 Text Interpretation: Sinus rhythm Ventricular premature complex Probable left atrial enlargement Nonspecific intraventricular conduction delay Anteroseptal infarct, old No significant change since last tracing Confirmed by Lacretia Leigh [phone]) on 02/04/2023 8:41:24 PM   Radiology DG Chest Portable 1 View  Result Date: 02/04/2023 CLINICAL DATA:  Chest pain, shortness of breath EXAM: PORTABLE CHEST 1 VIEW COMPARISON:  Previous studies including the chest radiograph done on 12/27/2022 and CT done on 01/27/2023 FINDINGS: There is previous left pneumonectomy. There are few  metallic clips in right lower lung field, possibly fiducial markers from previous biopsy increased interstitial markings are seen in right lower lung field. There is interval improvement in the aeration in right upper lung field since 01/27/2023. There is no focal consolidation. Right lateral CP angle is clear. There is no pneumothorax. Tip of left IJ central venous catheter is seen in superior vena cava. IMPRESSION: Increased interstitial markings in right lower lung field may suggest scarring or interstitial pneumonia. There is no focal consolidation. There is no right pleural effusion or pneumothorax. Previous left pneumonectomy. Electronically Signed   By: Elmer Picker M.D.   On: 02/04/2023 20:05    Procedures Procedures    Medications Ordered in ED Medications  fentaNYL (SUBLIMAZE) injection 50 mcg (50 mcg Intravenous Given 02/04/23 2135)    ED Course/ Medical Decision Making/ A&P                             Medical Decision Making Amount and/or Complexity of Data Reviewed Labs: ordered. Radiology: ordered.  Risk Prescription drug management.   This patient is a 67 y.o. male  who presents to the ED for concern of chest pain and shortness of breath. Hx lung cancer, s/p left  pneumonectomy. Currently on chemo.    Differential diagnoses prior to evaluation: The emergent differential diagnosis includes, but is not limited to,  ACS, pericarditis, myocarditis, aortic dissection, PE, pneumothorax, esophageal spasm or rupture.  This is not an exhaustive differential.   Past Medical History / Co-morbidities: CAD, lung cancer s/p left pneumonectomy in 2005, mitral regurgitation, CHF, COPD, hypertension  Additional history: Chart reviewed. Pertinent results include: Reviewed most recent oncology visit from 3/12, as well as most recent ER visit on 3/14 patient was complaining of abdominal pain starting just prior to ER arrival, and was noted to be hypotensive.  He had a CT scan that showed an aortic ulceration, vascular surgery recommended outpatient follow-up.  Follows with [facility], most recently seen on 3/18.  He was told that he is too high risk for replacement.  Physical Exam: Physical exam performed. The pertinent findings include: Chronically ill-appearing.  Tachypneic, with fairly normal respiratory effort.  Absent lung sounds on the left s/p pneumonectomy.  Trialed off oxygen during my exam, maintain normal O2 sats.  Lab Tests/Imaging studies: I personally interpreted labs/imaging and the pertinent results include: No leukocytosis, hemoglobin stable.  BMP grossly at baseline.  Initial troponin 56, delta troponin pending.  BNP pending.  Chest x-ray with increased interstitial markings in the right lower lung field.  No focal consolidation, effusion or pneumothorax. I agree with the radiologist interpretation.  CT PE study pending.  Cardiac monitoring: EKG obtained and interpreted by my attending physician which shows: sinus rhythm, no significant change compared to prior   Medications: I ordered medication including fentanyl.  I have reviewed the patients home medicines and have made adjustments as needed.   Disposition: Patient discussed and care  transferred to [facility] at shift change. Please see his/her note for further details regarding further ED course and disposition. Plan at time of handoff is follow up on CT PE study. Concern for possible PE vs pneumonia with increased interstitial markings on CXR. Delta troponin and BNP pending. Patient does not appear fluid overloaded, so lower concern for CHF exacerbation.  Could also be component of COPD, but patient without wheezing and maintaining normal O2 saturations on my exam so less likely.   Final  Clinical Impression(s) / ED Diagnoses Final diagnoses:  None    Rx / DC Orders ED Discharge Orders     None      Portions of this report may have been transcribed using voice recognition software. Every effort was made to ensure accuracy; however, inadvertent computerized transcription errors may be present.    Estill Cotta 02/04/23 2209    Lacretia Leigh, MD 02/04/23 [phone]

## 2023-02-04 NOTE — ED Triage Notes (Addendum)
Pt c/o chest pain over the past several days worsening over the past 3 hours. CP Left sided, described intermittent pressure, worsening pain with exertion, doesn't improve with rest. CP associated with SOB and weakness.

## 2023-02-05 DIAGNOSIS — J439 Emphysema, unspecified: Secondary | ICD-10-CM | POA: Diagnosis present

## 2023-02-05 DIAGNOSIS — I080 Rheumatic disorders of both mitral and aortic valves: Secondary | ICD-10-CM | POA: Diagnosis present

## 2023-02-05 DIAGNOSIS — J440 Chronic obstructive pulmonary disease with acute lower respiratory infection: Secondary | ICD-10-CM | POA: Diagnosis present

## 2023-02-05 DIAGNOSIS — E782 Mixed hyperlipidemia: Secondary | ICD-10-CM | POA: Diagnosis present

## 2023-02-05 DIAGNOSIS — Z66 Do not resuscitate: Secondary | ICD-10-CM | POA: Diagnosis present

## 2023-02-05 DIAGNOSIS — R0602 Shortness of breath: Secondary | ICD-10-CM | POA: Diagnosis not present

## 2023-02-05 DIAGNOSIS — E871 Hypo-osmolality and hyponatremia: Secondary | ICD-10-CM | POA: Diagnosis present

## 2023-02-05 DIAGNOSIS — G893 Neoplasm related pain (acute) (chronic): Secondary | ICD-10-CM | POA: Diagnosis present

## 2023-02-05 DIAGNOSIS — I051 Rheumatic mitral insufficiency: Secondary | ICD-10-CM | POA: Diagnosis not present

## 2023-02-05 DIAGNOSIS — Z515 Encounter for palliative care: Secondary | ICD-10-CM | POA: Diagnosis not present

## 2023-02-05 DIAGNOSIS — J189 Pneumonia, unspecified organism: Secondary | ICD-10-CM

## 2023-02-05 DIAGNOSIS — I252 Old myocardial infarction: Secondary | ICD-10-CM | POA: Diagnosis not present

## 2023-02-05 DIAGNOSIS — K219 Gastro-esophageal reflux disease without esophagitis: Secondary | ICD-10-CM | POA: Diagnosis present

## 2023-02-05 DIAGNOSIS — N179 Acute kidney failure, unspecified: Secondary | ICD-10-CM | POA: Diagnosis present

## 2023-02-05 DIAGNOSIS — C7800 Secondary malignant neoplasm of unspecified lung: Secondary | ICD-10-CM | POA: Diagnosis present

## 2023-02-05 DIAGNOSIS — I5043 Acute on chronic combined systolic (congestive) and diastolic (congestive) heart failure: Secondary | ICD-10-CM | POA: Diagnosis present

## 2023-02-05 DIAGNOSIS — Z1152 Encounter for screening for COVID-19: Secondary | ICD-10-CM | POA: Diagnosis not present

## 2023-02-05 DIAGNOSIS — I2510 Atherosclerotic heart disease of native coronary artery without angina pectoris: Secondary | ICD-10-CM | POA: Diagnosis not present

## 2023-02-05 DIAGNOSIS — Z87891 Personal history of nicotine dependence: Secondary | ICD-10-CM | POA: Diagnosis not present

## 2023-02-05 DIAGNOSIS — E039 Hypothyroidism, unspecified: Secondary | ICD-10-CM | POA: Diagnosis present

## 2023-02-05 DIAGNOSIS — Z9981 Dependence on supplemental oxygen: Secondary | ICD-10-CM | POA: Diagnosis not present

## 2023-02-05 DIAGNOSIS — I2489 Other forms of acute ischemic heart disease: Secondary | ICD-10-CM | POA: Diagnosis present

## 2023-02-05 DIAGNOSIS — J441 Chronic obstructive pulmonary disease with (acute) exacerbation: Secondary | ICD-10-CM | POA: Diagnosis present

## 2023-02-05 DIAGNOSIS — I130 Hypertensive heart and chronic kidney disease with heart failure and stage 1 through stage 4 chronic kidney disease, or unspecified chronic kidney disease: Secondary | ICD-10-CM | POA: Diagnosis present

## 2023-02-05 DIAGNOSIS — J9611 Chronic respiratory failure with hypoxia: Secondary | ICD-10-CM | POA: Diagnosis present

## 2023-02-05 DIAGNOSIS — I5022 Chronic systolic (congestive) heart failure: Secondary | ICD-10-CM | POA: Diagnosis not present

## 2023-02-05 DIAGNOSIS — Z7189 Other specified counseling: Secondary | ICD-10-CM | POA: Diagnosis not present

## 2023-02-05 DIAGNOSIS — N1831 Chronic kidney disease, stage 3a: Secondary | ICD-10-CM | POA: Diagnosis present

## 2023-02-05 DIAGNOSIS — I272 Pulmonary hypertension, unspecified: Secondary | ICD-10-CM | POA: Diagnosis present

## 2023-02-05 DIAGNOSIS — Z79899 Other long term (current) drug therapy: Secondary | ICD-10-CM | POA: Diagnosis not present

## 2023-02-05 LAB — RESP PANEL BY RT-PCR (RSV, FLU A&B, COVID)  RVPGX2
Influenza A by PCR: NEGATIVE
Influenza B by PCR: NEGATIVE
Resp Syncytial Virus by PCR: NEGATIVE
SARS Coronavirus 2 by RT PCR: NEGATIVE

## 2023-02-05 LAB — STREP PNEUMONIAE URINARY ANTIGEN: Strep Pneumo Urinary Antigen: NEGATIVE

## 2023-02-05 MED ORDER — ASPIRIN 81 MG PO TBEC
81.0000 mg | DELAYED_RELEASE_TABLET | Freq: Every day | ORAL | Status: DC
  Administered 2023-02-05 – 2023-02-10 (×6): 81 mg via ORAL
  Filled 2023-02-05 (×6): qty 1

## 2023-02-05 MED ORDER — ONDANSETRON HCL 4 MG/2ML IJ SOLN: 4.0000 mg | Freq: Four times a day (QID) | INTRAMUSCULAR | Status: DC | PRN

## 2023-02-05 MED ORDER — SODIUM CHLORIDE 0.9 % IV BOLUS
250.0000 mL | Freq: Once | INTRAVENOUS | Status: AC
  Administered 2023-02-05: 250 mL via INTRAVENOUS

## 2023-02-05 MED ORDER — ROSUVASTATIN CALCIUM 5 MG PO TABS
5.0000 mg | ORAL_TABLET | Freq: Every day | ORAL | Status: DC
  Administered 2023-02-05 – 2023-02-10 (×6): 5 mg via ORAL
  Filled 2023-02-05 (×6): qty 1

## 2023-02-05 MED ORDER — LEVOTHYROXINE SODIUM 75 MCG PO TABS
75.0000 ug | ORAL_TABLET | Freq: Every day | ORAL | Status: DC
  Administered 2023-02-05 – 2023-02-10 (×6): 75 ug via ORAL
  Filled 2023-02-05 (×6): qty 1

## 2023-02-05 MED ORDER — AZITHROMYCIN 250 MG PO TABS
500.0000 mg | ORAL_TABLET | Freq: Once | ORAL | Status: AC
  Administered 2023-02-05: 500 mg via ORAL
  Filled 2023-02-05: qty 2

## 2023-02-05 MED ORDER — SODIUM CHLORIDE 0.9 % IV SOLN
1.0000 g | Freq: Once | INTRAVENOUS | Status: AC
  Administered 2023-02-05: 1 g via INTRAVENOUS
  Filled 2023-02-05: qty 10

## 2023-02-05 MED ORDER — ALBUTEROL SULFATE (2.5 MG/3ML) 0.083% IN NEBU: 2.5000 mg | INHALATION_SOLUTION | RESPIRATORY_TRACT | Status: DC | PRN

## 2023-02-05 MED ORDER — ACETAMINOPHEN 325 MG PO TABS
650.0000 mg | ORAL_TABLET | Freq: Four times a day (QID) | ORAL | Status: DC | PRN
  Administered 2023-02-06 (×2): 650 mg via ORAL
  Filled 2023-02-05 (×2): qty 2

## 2023-02-05 MED ORDER — ACETAMINOPHEN 500 MG PO TABS
1000.0000 mg | ORAL_TABLET | Freq: Once | ORAL | Status: DC
  Filled 2023-02-05: qty 2

## 2023-02-05 MED ORDER — ENOXAPARIN SODIUM 40 MG/0.4ML IJ SOSY
40.0000 mg | PREFILLED_SYRINGE | INTRAMUSCULAR | Status: DC
  Administered 2023-02-05 – 2023-02-06 (×2): 40 mg via SUBCUTANEOUS
  Filled 2023-02-05 (×2): qty 0.4

## 2023-02-05 MED ORDER — SODIUM CHLORIDE 0.9 % IV SOLN
1.0000 g | INTRAVENOUS | Status: DC
  Administered 2023-02-06: 1 g via INTRAVENOUS
  Filled 2023-02-05: qty 10

## 2023-02-05 MED ORDER — LORAZEPAM 1 MG PO TABS
1.0000 mg | ORAL_TABLET | Freq: Two times a day (BID) | ORAL | Status: DC | PRN
  Administered 2023-02-05 – 2023-02-09 (×2): 1 mg via ORAL
  Filled 2023-02-05 (×2): qty 1

## 2023-02-05 MED ORDER — SODIUM CHLORIDE 0.9 % IV SOLN
500.0000 mg | INTRAVENOUS | Status: DC
  Administered 2023-02-06 – 2023-02-07 (×2): 500 mg via INTRAVENOUS
  Filled 2023-02-05 (×2): qty 5

## 2023-02-05 MED ORDER — NITROGLYCERIN 0.4 MG SL SUBL: 0.4000 mg | SUBLINGUAL_TABLET | SUBLINGUAL | Status: DC | PRN

## 2023-02-05 MED ORDER — OXYCODONE HCL 5 MG PO TABS
5.0000 mg | ORAL_TABLET | ORAL | Status: DC | PRN
  Administered 2023-02-05 (×2): 5 mg via ORAL
  Filled 2023-02-05 (×2): qty 1

## 2023-02-05 MED ORDER — ACETAMINOPHEN 650 MG RE SUPP: 650.0000 mg | Freq: Four times a day (QID) | RECTAL | Status: DC | PRN

## 2023-02-05 MED ORDER — MOMETASONE FURO-FORMOTEROL FUM 200-5 MCG/ACT IN AERO
2.0000 | INHALATION_SPRAY | Freq: Two times a day (BID) | RESPIRATORY_TRACT | Status: DC
  Administered 2023-02-05 – 2023-02-10 (×11): 2 via RESPIRATORY_TRACT
  Filled 2023-02-05: qty 8.8

## 2023-02-05 MED ORDER — GABAPENTIN 300 MG PO CAPS
300.0000 mg | ORAL_CAPSULE | Freq: Three times a day (TID) | ORAL | Status: DC
  Administered 2023-02-05 – 2023-02-10 (×15): 300 mg via ORAL
  Filled 2023-02-05 (×16): qty 1

## 2023-02-05 MED ORDER — RAMIPRIL 1.25 MG PO CAPS
2.5000 mg | ORAL_CAPSULE | Freq: Every day | ORAL | Status: DC
  Administered 2023-02-05: 2.5 mg via ORAL
  Filled 2023-02-05 (×2): qty 2

## 2023-02-05 MED ORDER — FUROSEMIDE 40 MG PO TABS
40.0000 mg | ORAL_TABLET | Freq: Every day | ORAL | Status: DC
  Administered 2023-02-05: 40 mg via ORAL
  Filled 2023-02-05: qty 1

## 2023-02-05 MED ORDER — MONTELUKAST SODIUM 10 MG PO TABS
10.0000 mg | ORAL_TABLET | Freq: Every day | ORAL | Status: DC
  Administered 2023-02-05 – 2023-02-09 (×5): 10 mg via ORAL
  Filled 2023-02-05 (×5): qty 1

## 2023-02-05 MED ORDER — SPIRONOLACTONE 25 MG PO TABS
25.0000 mg | ORAL_TABLET | Freq: Every day | ORAL | Status: DC
  Administered 2023-02-05: 25 mg via ORAL
  Filled 2023-02-05: qty 1

## 2023-02-05 MED ORDER — ONDANSETRON HCL 4 MG PO TABS: 4.0000 mg | ORAL_TABLET | Freq: Four times a day (QID) | ORAL | Status: DC | PRN

## 2023-02-05 NOTE — H&P (Signed)
History and Physical    Patient: Ronald [name] [id number] DOB: [birth year] DOA: 02/04/2023 DOS: the patient was seen and examined on 02/05/2023 PCP: System, Provider Not In  Patient coming from: Home  Chief Complaint:  Chief Complaint  Patient presents with   Chest Pain   HPI: Ronald [name] is a 67 y.o. male with medical history significant of systolic CHF COPD, CAD, history of MI, hypertension, lung cancer, history of left pneumonectomy, chemo and radiotherapy who presented to the emergency department with complaints of pleuritic chest associated with fatigue, chills, dyspnea, wheezing and productive cough for yellowish sputum.  No fever or night sweats.  No palpitations, diaphoresis, PND, orthopnea or pitting edema of the lower extremities.  No abdominal pain, nausea, emesis, diarrhea, constipation, melena or hematochezia.  No flank pain, dysuria, frequency or hematuria.  No polyuria, polydipsia, polyphagia or blurred vision.   Lab work: Coronavirus, influenza and RSV PCR negative.  CBC is her white count 6.7, hemoglobin 12.9 g/dL platelets 306.  Troponin was 56 then 59 ng/L.  BNP 755 pg/mL.  BMP showed sodium 134 and CO2 18 mmol/L.  Creatinine was 1.34 mg/dL.  The rest of the electrolytes and BUN were normal.  Imaging: Portable 1 view chest radiograph show previous left pneumonectomy and increased interstitial markings in the right lower lung field suggesting scarring or interstitial pneumonia.  CTA chest showed cardiomegaly, emphysema with diffuse interstitial densities throughout the right hemithorax with slight increase in hazy pulmonary density in the right lower lobe that could be due to superimposed infectious or inflammatory process.  There is a stable 2 cm right middle lobe spiculated nodule.   ED course: Initial vital signs were temperature 97.4 F, pulse 89, respirations 24, BP 113/71 mmHg O2 sat 98% on nasal cannula at 2 LPM.  Patient received ceftriaxone, azithromycin and  15 mcg of fentanyl IVP.  Review of Systems: As mentioned in the history of present illness. All other systems reviewed and are negative. Past Medical History:  Diagnosis Date   CHF (congestive heart failure) (HCC)    COPD (chronic obstructive pulmonary disease) ([place])    Coronary artery disease    Hypertension    lung ca dx'd 2005   chemo/xrt comp 2005, lung ca   Myocardial infarction [facility])    Shortness of breath    Past Surgical History:  Procedure Laterality Date   CARDIAC CATHETERIZATION N/A 10/13/2015   Procedure: Left Heart Cath and Coronary Angiography;  Surgeon: [name], MD;  Location: [place] CV LAB;  Service: Cardiovascular;  Laterality: N/A;   CORONARY ANGIOPLASTY WITH STENT PLACEMENT     LEFT HEART CATH AND CORONARY ANGIOGRAPHY N/A 01/03/2018   Procedure: LEFT HEART CATH AND CORONARY ANGIOGRAPHY;  Surgeon: [name], MD;  Location: [place] CV LAB;  Service: Cardiovascular;  Laterality: N/A;   LEFT HEART CATHETERIZATION WITH CORONARY ANGIOGRAM N/A 04/07/2012   Procedure: LEFT HEART CATHETERIZATION WITH CORONARY ANGIOGRAM;  Surgeon: [name], MD;  Location: [place] CATH LAB;  Service: Cardiovascular;  Laterality: N/A;   PNEUMONECTOMY  2005   left   vocal cord surgery  2005   Social History:  reports that he quit smoking about 9 years ago. His smoking use included cigarettes. He has never used smokeless tobacco. He reports that he does not drink alcohol and does not use drugs.  Allergies  Allergen Reactions   Penicillins Itching    Family History  Problem Relation Age of Onset   Hypertension Other    Diabetes  Other     Prior to Admission medications   Medication Sig Start Date End Date Taking? Authorizing Provider  albuterol (PROVENTIL HFA;VENTOLIN HFA) 108 (90 BASE) MCG/ACT inhaler Inhale 2 puffs into the lungs every 4 (four) hours as needed for wheezing or shortness of breath. 10/14/13   Ward, Delice Bison, DO  aspirin EC 81 MG tablet Take 81 mg by  mouth daily. Swallow whole.    [provider]  budesonide-formoterol (SYMBICORT) 160-4.5 MCG/ACT inhaler Inhale 2 puffs into the lungs daily.    [provider]  furosemide (LASIX) 40 MG tablet Take 1 tablet (40 mg total) by mouth 2 (two) times daily. Twice a day for 3 days, then back to normal dosing. Patient taking differently: Take 40 mg by mouth 2 (two) times daily. 05/02/15   Withrow, Elyse Jarvis, FNP  gabapentin (NEURONTIN) 300 MG capsule Take 300 mg by mouth 3 (three) times daily.    [provider]  hydrochlorothiazide (HYDRODIURIL) 25 MG tablet Take 25 mg by mouth daily. 06/29/22   [provider]  levothyroxine (SYNTHROID) 50 MCG tablet Take 1 tablet (50 mcg total) by mouth daily before breakfast. Patient not taking: Reported on 01/27/2023 12/16/22   Curt Bears, MD  levothyroxine (SYNTHROID) 75 MCG tablet Take 75 mcg by mouth daily. 01/19/23   [provider]  lidocaine-prilocaine (EMLA) cream Apply 1 Application topically as needed (To Port-a-Cath). 12/10/22   Curt Bears, MD  LORazepam (ATIVAN) 1 MG tablet Take 1 tablet (1 mg total) by mouth 2 (two) times daily as needed for anxiety. 12/27/22   Jeanell Sparrow, DO  montelukast (SINGULAIR) 10 MG tablet Take 1 tablet (10 mg total) by mouth at bedtime. 05/02/15   Withrow, Elyse Jarvis, FNP  nitroGLYCERIN (NITROSTAT) 0.4 MG SL tablet Place 1 tablet (0.4 mg total) under the tongue every 5 (five) minutes x 3 doses as needed for chest pain. 11/01/13   [name], MD  ondansetron (ZOFRAN ODT) 4 MG disintegrating tablet Take 1 tablet (4 mg total) by mouth every 8 (eight) hours as needed for nausea or vomiting. 01/31/17   Rai, Ripudeep K, MD  ramipril (ALTACE) 2.5 MG capsule Take 2.5 mg by mouth daily. 10/05/22   [provider]  rosuvastatin (CRESTOR) 5 MG tablet Take 5 mg by mouth daily. 12/03/22   [provider]  spironolactone (ALDACTONE) 25 MG tablet Take 1 tablet (25 mg total) by mouth  daily. 07/06/15   Orson Eva, MD  TRELEGY ELLIPTA 200-62.5-25 MCG/ACT AEPB Take 1 puff by mouth daily. 12/02/22   [provider]  zolpidem (AMBIEN) 10 MG tablet Take 10 mg by mouth at bedtime. 01/21/23   [provider]    Physical Exam: Vitals:   02/05/23 0330 02/05/23 0400 02/05/23 0600 02/05/23 0617  BP: 127/75 109/75 100/68   Pulse: (!) 105 95 83   Resp:  (!) 22 20   Temp:    97.8 F (36.6 C)  TempSrc:    Oral  SpO2: 95% (!) 89% 97%   Weight:      Height:       Physical Exam Vitals and nursing note reviewed.  Constitutional:      General: He is awake. He is not in acute distress.    Appearance: He is well-developed.  HENT:     Head: Normocephalic.     Nose: No rhinorrhea.     Mouth/Throat:     Mouth: Mucous membranes are moist.  Eyes:     General:  No scleral icterus.    Pupils: Pupils are equal, round, and reactive to light.  Neck:     Vascular: No JVD.  Cardiovascular:     Rate and Rhythm: Normal rate and regular rhythm.     Heart sounds: S1 normal and S2 normal.  Pulmonary:     Breath sounds: Wheezing, rhonchi and rales present.  Abdominal:     General: Bowel sounds are normal. There is no distension.     Palpations: Abdomen is soft.     Tenderness: There is no abdominal tenderness. There is no guarding.  Musculoskeletal:     Cervical back: Neck supple.     Right lower leg: No edema.     Left lower leg: No edema.  Skin:    General: Skin is warm and dry.  Neurological:     General: No focal deficit present.     Mental Status: He is alert and oriented to person, place, and time.  Psychiatric:        Mood and Affect: Mood normal.        Behavior: Behavior normal. Behavior is cooperative.   Data Reviewed:  Results are pending, will review when available.  Assessment and Plan: Principal Problem:   CAP (community acquired pneumonia) With superimposed:   COPD exacerbation ([place]) Admit to telemetry/inpatient. Continue supplemental  oxygen. Scheduled and as needed bronchodilators. Continue ceftriaxone 1 g IVPB daily. Continue azithromycin 500 mg IVPB daily. Check strep pneumoniae urinary antigen. Check sputum Gram stain, culture and sensitivity. Follow-up blood culture and sensitivity. Follow-up CBC and chemistry in the morning.  Active Problems:   CAD (coronary artery disease) Continue statin and ACE inhibitor. Sublingual nitroglycerin as needed.    Mitral regurgitation Check echocardiogram.    Chronic systolic CHF (congestive heart failure) No sign of decompensation. Continue furosemide 40 mg p.o. daily. Continue ramipril 2.5 mg p.o. daily. Continue spironolactone 25 mg p.o. daily.    Essential hypertension Continue-p.o. hypertensives as above. Monitor BP, renal function electrolytes.    Gastroesophageal reflux disease without esophagitis Antiacids, H2 blocker or PPI as needed.    Hypothyroidism (acquired) Continue levothyroxine 75 mcg p.o. daily.    Mixed hyperlipidemia Continue rosuvastatin 5 mg p.o. daily.     Advance Care Planning:   Code Status: Full Code   Consults:   Family Communication:   Severity of Illness: The appropriate patient status for this patient is INPATIENT. Inpatient status is judged to be reasonable and necessary in order to provide the required intensity of service to ensure the patient's safety. The patient's presenting symptoms, physical exam findings, and initial radiographic and laboratory data in the context of their chronic comorbidities is felt to place them at high risk for further clinical deterioration. Furthermore, it is not anticipated that the patient will be medically stable for discharge from the hospital within 2 midnights of admission.   * I certify that at the point of admission it is my clinical judgment that the patient will require inpatient hospital care spanning beyond 2 midnights from the point of admission due to high intensity of service, high  risk for further deterioration and high frequency of surveillance required.*  Author: Reubin Milan, MD 02/05/2023 7:46 AM  For on call review [email or web address].   This document was prepared using Dragon voice recognition software and may contain some unintended transcription errors.

## 2023-02-05 NOTE — Progress Notes (Signed)
RT instructed patient on the use of a flutter valve and incentive spirometer. Patient able to demonstrate back good technique with both devices.

## 2023-02-05 NOTE — ED Notes (Signed)
Attempt to ambulate PT. PT 02 drop down to 90% room air PT place back on 3L. PT 02 94% 3L. PT became off balance inside room while walking to door. PT place back in bed. RN info

## 2023-02-05 NOTE — Progress Notes (Signed)
This is a 67 year old male with history of lung cancer, with recurrence. Patient currently on chemotherapy.  He is sp left lobectomy 2007.  Additionally, he has HTN, PAH, hypothyroidism, CKD stage II, COPD, PRN oxygen, and a h/o drug abuse.  He presents with complaint of chest pains x 2 days, worse on the left.  He has associated cough.  He is tachypneic.  A CTA chest has a soft read for pneumonia.  In the ER patient is on 3 L oxygen to keep sats at 95% antibiotics initiated.  Admission requested

## 2023-02-05 NOTE — ED Notes (Signed)
ED TO INPATIENT HANDOFF REPORT  ED Nurse Name and Phone #: Waynette Buttery Name/Age/Gender Ronald [name] 67 y.o. male Room/Bed: WA18/WA18  Code Status   Code Status: Full Code  Home/SNF/Other Home Patient oriented to: self, place, time, and situation Is this baseline? Yes   Triage Complete: Triage complete  Chief Complaint CAP (community acquired pneumonia) [J18.9]  Triage Note Pt c/o chest pain over the past several days worsening over the past 3 hours. CP Left sided, described intermittent pressure, worsening pain with exertion, doesn't improve with rest. CP associated with SOB and weakness.   Allergies Allergies  Allergen Reactions   Penicillins Itching    Level of Care/Admitting Diagnosis ED Disposition     ED Disposition  Admit   Condition  --   Comment  Hospital Area: [place] [id number]  Level of Care: Telemetry [5]  Admit to tele based on following criteria: Monitor for Ischemic changes  May admit patient to [facility] or [facility] if equivalent level of care is available:: No  Covid Evaluation: Confirmed COVID Negative  Diagnosis: CAP (community acquired pneumonia) [id number]  Admitting Physician: Reubin Milan [id number]  Attending Physician: Reubin Milan [id number]  Certification:: I certify this patient will need inpatient services for at least 2 midnights  Estimated Length of Stay: 2          B Medical/Surgery History Past Medical History:  Diagnosis Date   CHF (congestive heart failure) ([place])    COPD (chronic obstructive pulmonary disease) ([place])    Coronary artery disease    Hypertension    lung ca dx'd 2005   chemo/xrt comp 2005, lung ca   Myocardial infarction ([place])    Shortness of breath    Past Surgical History:  Procedure Laterality Date   CARDIAC CATHETERIZATION N/A 10/13/2015   Procedure: Left Heart Cath and Coronary Angiography;  Surgeon: [name], MD;  Location: [facility];  Service: Cardiovascular;  Laterality: N/A;   CORONARY ANGIOPLASTY WITH STENT PLACEMENT     LEFT HEART CATH AND CORONARY ANGIOGRAPHY N/A 01/03/2018   Procedure: LEFT HEART CATH AND CORONARY ANGIOGRAPHY;  Surgeon: [name], MD;  Location: [facility];  Service: Cardiovascular;  Laterality: N/A;   LEFT HEART CATHETERIZATION WITH CORONARY ANGIOGRAM N/A 04/07/2012   Procedure: LEFT HEART CATHETERIZATION WITH CORONARY ANGIOGRAM;  Surgeon: [name], MD;  Location: [facility];  Service: Cardiovascular;  Laterality: N/A;   PNEUMONECTOMY  2005   left   vocal cord surgery  2005     A IV Location/Drains/Wounds Patient Lines/Drains/Airways Status     Active Line/Drains/Airways     Name Placement date Placement time Site Days   Implanted Port 03/17/22 Left Chest 03/17/22  0000  Chest  325   Peripheral IV 02/04/23 20 G 1" Right Wrist 02/04/23  1944  Wrist  1   Peripheral IV 02/04/23 20 G Anterior;Right Forearm 02/04/23  2225  Forearm  1            Intake/Output Last 24 hours  Intake/Output Summary (Last 24 hours) at 02/05/2023 0859 Last data filed at 02/05/2023 0315 Gross per 24 hour  Intake 100 ml  Output --  Net 100 ml    Labs/Imaging Results for orders placed or performed during the hospital encounter of 02/04/23 (from the past 48 hour(s))  Basic metabolic panel     Status: Abnormal   Collection Time: 02/04/23  7:44 PM  Result Value Ref Range  Sodium 134 (L) 135 - 145 mmol/L   Potassium 4.0 3.5 - 5.1 mmol/L   Chloride 106 98 - 111 mmol/L   CO2 18 (L) 22 - 32 mmol/L   Glucose, Bld 96 70 - 99 mg/dL    Comment: Glucose reference range applies only to samples taken after fasting for at least 8 hours.   BUN 20 8 - 23 mg/dL   Creatinine, Ser 1.34 (H) 0.61 - 1.24 mg/dL   Calcium 9.0 8.9 - 10.3 mg/dL   GFR, Estimated 59 (L) >60 mL/min    Comment: (NOTE) Calculated using the CKD-EPI Creatinine Equation (2021)    Anion gap 10 5 - 15    Comment: Performed at  [facility], [place] [address] [place], [place] [address]  CBC     Status: Abnormal   Collection Time: 02/04/23  7:44 PM  Result Value Ref Range   WBC 6.7 4.0 - 10.5 K/uL   RBC 4.29 4.22 - 5.81 MIL/uL   Hemoglobin 12.8 (L) 13.0 - 17.0 g/dL   HCT 38.5 (L) 39.0 - 52.0 %   MCV 89.7 80.0 - 100.0 fL   MCH 29.8 26.0 - 34.0 pg   MCHC 33.2 30.0 - 36.0 g/dL   RDW 17.7 (H) 11.5 - 15.5 %   Platelets 306 150 - 400 K/uL   nRBC 0.0 0.0 - 0.2 %    Comment: Performed at [facility], [place] [address] [place], [place] [address]  Troponin I (High Sensitivity)     Status: Abnormal   Collection Time: 02/04/23  7:44 PM  Result Value Ref Range   Troponin I (High Sensitivity) 56 (H) <18 ng/L    Comment: (NOTE) Elevated high sensitivity troponin I (hsTnI) values and significant  changes across serial measurements may suggest ACS but many other  chronic and acute conditions are known to elevate hsTnI results.  Refer to the "Links" section for chest pain algorithms and additional  guidance. Performed at [facility], [place] [address] [place], [place] [address]   Brain natriuretic peptide     Status: Abnormal   Collection Time: 02/04/23  7:44 PM  Result Value Ref Range   B Natriuretic Peptide 754.7 (H) 0.0 - 100.0 pg/mL    Comment: Performed at [facility], [place] [address] [place], [place] [address]  Troponin I (High Sensitivity)     Status: Abnormal   Collection Time: 02/04/23  9:58 PM  Result Value Ref Range   Troponin I (High Sensitivity) 59 (H) <18 ng/L    Comment: (NOTE) Elevated high sensitivity troponin I (hsTnI) values and significant  changes across serial measurements may suggest ACS but many other  chronic and acute conditions are known to elevate hsTnI results.  Refer to the "Links" section for chest pain algorithms and additional  guidance. Performed at [facility], [place] [address] [place], [place] [address]   Resp panel by RT-PCR (RSV, Flu A&B, Covid) Anterior Nasal Swab     Status: None   Collection Time: 02/04/23 11:51 PM   Specimen: Anterior Nasal Swab  Result Value Ref Range   SARS Coronavirus 2 by RT PCR NEGATIVE NEGATIVE    Comment: (NOTE) SARS-CoV-2 target nucleic acids are NOT DETECTED.  The SARS-CoV-2 RNA is generally detectable in upper respiratory specimens during the acute phase of infection. The lowest concentration of SARS-CoV-2 viral copies this assay can detect is 138 copies/mL. A negative result does not preclude SARS-Cov-2 infection and  should not be used as the sole basis for treatment or other patient management decisions. A negative result may occur with  improper specimen collection/handling, submission of specimen other than nasopharyngeal swab, presence of viral mutation(s) within the areas targeted by this assay, and inadequate number of viral copies(<138 copies/mL). A negative result must be combined with clinical observations, patient history, and epidemiological information. The expected result is Negative.  Fact Sheet for Patients:  EntrepreneurPulse.com.au  Fact Sheet for Healthcare Providers:  IncredibleEmployment.be  This test is no t yet approved or cleared by the [place] FDA and  has been authorized for detection and/or diagnosis of SARS-CoV-2 by FDA under an Emergency Use Authorization (EUA). This EUA will remain  in effect (meaning this test can be used) for the duration of the COVID-19 declaration under Section 564(b)(1) of the Act, 21 U.S.C.section 360bbb-3(b)(1), unless the authorization is terminated  or revoked sooner.       Influenza A by PCR NEGATIVE NEGATIVE   Influenza B by PCR NEGATIVE NEGATIVE    Comment: (NOTE) The Xpert Xpress SARS-CoV-2/FLU/RSV plus assay is intended as an aid in the diagnosis of influenza from Nasopharyngeal swab specimens and should not be  used as a sole basis for treatment. Nasal washings and aspirates are unacceptable for Xpert Xpress SARS-CoV-2/FLU/RSV testing.  Fact Sheet for Patients: EntrepreneurPulse.com.au  Fact Sheet for Healthcare Providers: IncredibleEmployment.be  This test is not yet approved or cleared by the [place] FDA and has been authorized for detection and/or diagnosis of SARS-CoV-2 by FDA under an Emergency Use Authorization (EUA). This EUA will remain in effect (meaning this test can be used) for the duration of the COVID-19 declaration under Section 564(b)(1) of the Act, 21 U.S.C. section 360bbb-3(b)(1), unless the authorization is terminated or revoked.     Resp Syncytial Virus by PCR NEGATIVE NEGATIVE    Comment: (NOTE) Fact Sheet for Patients: EntrepreneurPulse.com.au  Fact Sheet for Healthcare Providers: IncredibleEmployment.be  This test is not yet approved or cleared by the [place] FDA and has been authorized for detection and/or diagnosis of SARS-CoV-2 by FDA under an Emergency Use Authorization (EUA). This EUA will remain in effect (meaning this test can be used) for the duration of the COVID-19 declaration under Section 564(b)(1) of the Act, 21 U.S.C. section 360bbb-3(b)(1), unless the authorization is terminated or revoked.  Performed at [facility], [place] [address] [place], [place] [address]    CT Angio Chest PE W and/or Wo Contrast  Result Date: 02/04/2023 CLINICAL DATA:  Chest pain shortness of breath EXAM: CT ANGIOGRAPHY CHEST WITH CONTRAST TECHNIQUE: Multidetector CT imaging of the chest was performed using the standard protocol during bolus administration of intravenous contrast. Multiplanar CT image reconstructions and MIPs were obtained to evaluate the vascular anatomy. RADIATION DOSE REDUCTION: This exam was performed according to the departmental dose-optimization  program which includes automated exposure control, adjustment of the mA and/or kV according to patient size and/or use of iterative reconstruction technique. CONTRAST:  101mL OMNIPAQUE IOHEXOL 350 MG/ML SOLN COMPARISON:  Chest x-ray 02/04/2023, CT chest 01/27/2023, 12/16/2022 FINDINGS: Cardiovascular: Satisfactory opacification of the pulmonary arteries to the segmental level. No evidence of pulmonary embolism on the right. Cardiomegaly. Moderate aortic atherosclerosis without aneurysm. Coronary stent and calcifications. No significant pericardial effusion. Mediastinum/Nodes: Patent trachea. No suspicious thyroid nodule. Shift of mediastinal contents to the left due to history of left pneumonectomy. Esophagus within normal limits. Enlarged right hilar nodes measuring up to 14 mm not significantly changed. Esophagus within  normal limits. Lungs/Pleura: Left pneumonectomy with chronic thick-walled fluid in the left thoracic cavity. Emphysema. Diffuse reticular densities throughout the right lung. Spiculated right middle lobe mass measuring 1.6 by 2 cm, previously 2 x 1.8 cm with adjacent fiducial markers. Increased hazy pulmonary density in the right lower lobe compared to prior. Upper Abdomen: No acute finding. Reflux of contrast into the hepatic veins consistent with elevated right heart pressure. Musculoskeletal: Postsurgical changes of the left ribs. No acute or suspicious osseous abnormality. Review of the MIP images confirms the above findings. IMPRESSION: 1. Status post left pneumonectomy. No evidence for acute right pulmonary embolus. 2. Cardiomegaly 3. Emphysema with diffuse interstitial densities throughout the right thorax, similar to previous exams. Slight increased hazy pulmonary density in the right lower lobe could be due to superimposed infectious or inflammatory process. 4. Grossly stable 2 cm right middle lobe spiculated nodule with adjacent fiducial markers Aortic Atherosclerosis (ICD10-I70.0) and  Emphysema (ICD10-J43.9). Electronically Signed   By: Donavan Foil M.D.   On: 02/04/2023 23:30   DG Chest Portable 1 View  Result Date: 02/04/2023 CLINICAL DATA:  Chest pain, shortness of breath EXAM: PORTABLE CHEST 1 VIEW COMPARISON:  Previous studies including the chest radiograph done on 12/27/2022 and CT done on 01/27/2023 FINDINGS: There is previous left pneumonectomy. There are few metallic clips in right lower lung field, possibly fiducial markers from previous biopsy increased interstitial markings are seen in right lower lung field. There is interval improvement in the aeration in right upper lung field since 01/27/2023. There is no focal consolidation. Right lateral CP angle is clear. There is no pneumothorax. Tip of left IJ central venous catheter is seen in superior vena cava. IMPRESSION: Increased interstitial markings in right lower lung field may suggest scarring or interstitial pneumonia. There is no focal consolidation. There is no right pleural effusion or pneumothorax. Previous left pneumonectomy. Electronically Signed   By: Elmer Picker M.D.   On: 02/04/2023 20:05    Pending Labs Unresulted Labs (From admission, onward)     Start     Ordered   02/12/23 0500  Creatinine, serum  (enoxaparin (LOVENOX)    CrCl >/= 30 ml/min)  Weekly,   R     Comments: while on enoxaparin therapy    02/05/23 0808   02/06/23 0500  HIV Antibody (routine testing w rflx)  (HIV Antibody (Routine testing w reflex) panel)  Tomorrow morning,   R        02/05/23 0808   02/06/23 0500  CBC  Tomorrow morning,   R        02/05/23 0808   02/06/23 0500  Comprehensive metabolic panel  Tomorrow morning,   R        02/05/23 0808   02/05/23 0805  Expectorated Sputum Assessment w Gram Stain, Rflx to Resp Cult  (COPD / Pneumonia / Cellulitis / Lower Extremity Wound)  Once,   R        02/05/23 0808   02/05/23 0805  Strep pneumoniae urinary antigen  (COPD / Pneumonia / Cellulitis / Lower Extremity Wound)  Once,    R        02/05/23 0808   02/05/23 0805  Legionella Pneumophila Serogp 1 Ur Ag  (COPD / Pneumonia / Cellulitis / Lower Extremity Wound)  Once,   R        02/05/23 0808   02/05/23 0207  Blood culture (routine x 2)  BLOOD CULTURE X 2,   R (with STAT occurrences)  02/05/23 0206            Vitals/Pain Today's Vitals   02/05/23 0330 02/05/23 0400 02/05/23 0600 02/05/23 0617  BP: 127/75 109/75 100/68   Pulse: (!) 105 95 83   Resp:  (!) 22 20   Temp:    97.8 F (36.6 C)  TempSrc:    Oral  SpO2: 95% (!) 89% 97%   Weight:      Height:      PainSc:        Isolation Precautions Airborne and Contact precautions  Medications Medications  acetaminophen (TYLENOL) tablet 1,000 mg (1,000 mg Oral Patient Refused/Not Given 02/05/23 0828)  cefTRIAXone (ROCEPHIN) 1 g in sodium chloride 0.9 % 100 mL IVPB (has no administration in time range)  azithromycin (ZITHROMAX) 500 mg in sodium chloride 0.9 % 250 mL IVPB (has no administration in time range)  enoxaparin (LOVENOX) injection 40 mg (has no administration in time range)  acetaminophen (TYLENOL) tablet 650 mg (has no administration in time range)    Or  acetaminophen (TYLENOL) suppository 650 mg (has no administration in time range)  ondansetron (ZOFRAN) tablet 4 mg (has no administration in time range)    Or  ondansetron (ZOFRAN) injection 4 mg (has no administration in time range)  oxyCODONE (Oxy IR/ROXICODONE) immediate release tablet 5 mg (5 mg Oral Given 02/05/23 0844)  fentaNYL (SUBLIMAZE) injection 50 mcg (50 mcg Intravenous Given 02/04/23 2135)  iohexol (OMNIPAQUE) 350 MG/ML injection 75 mL (75 mLs Intravenous Contrast Given 02/04/23 2311)  cefTRIAXone (ROCEPHIN) 1 g in sodium chloride 0.9 % 100 mL IVPB (0 g Intravenous Stopped 02/05/23 0315)  azithromycin (ZITHROMAX) tablet 500 mg (500 mg Oral Given 02/05/23 0245)    Mobility walks     Focused Assessments Cardiac Assessment Handoff:    Lab Results  Component Value Date    CKTOTAL 123 04/06/2012   CKMB 3.7 04/06/2012   TROPONINI <0.03 12/30/2017   Lab Results  Component Value Date   DDIMER 1.56 (H) 05/21/2011   Does the Patient currently have chest pain? No    R Recommendations: See Admitting Provider Note  Report given to:   Additional Notes:  Pt is usually ambulatory but hasn't since he has been here due to weakness

## 2023-02-06 ENCOUNTER — Inpatient Hospital Stay (HOSPITAL_COMMUNITY): Payer: 59

## 2023-02-06 DIAGNOSIS — I2510 Atherosclerotic heart disease of native coronary artery without angina pectoris: Secondary | ICD-10-CM | POA: Diagnosis not present

## 2023-02-06 DIAGNOSIS — J189 Pneumonia, unspecified organism: Secondary | ICD-10-CM | POA: Diagnosis not present

## 2023-02-06 DIAGNOSIS — I5022 Chronic systolic (congestive) heart failure: Secondary | ICD-10-CM | POA: Diagnosis not present

## 2023-02-06 LAB — COMPREHENSIVE METABOLIC PANEL
ALT: 22 U/L (ref 0–44)
AST: 24 U/L (ref 15–41)
Albumin: 3.3 g/dL — ABNORMAL LOW (ref 3.5–5.0)
Alkaline Phosphatase: 66 U/L (ref 38–126)
Anion gap: 9 (ref 5–15)
BUN: 24 mg/dL — ABNORMAL HIGH (ref 8–23)
CO2: 21 mmol/L — ABNORMAL LOW (ref 22–32)
Calcium: 8.6 mg/dL — ABNORMAL LOW (ref 8.9–10.3)
Chloride: 104 mmol/L (ref 98–111)
Creatinine, Ser: 1.58 mg/dL — ABNORMAL HIGH (ref 0.61–1.24)
GFR, Estimated: 48 mL/min — ABNORMAL LOW (ref >60)
Glucose, Bld: 116 mg/dL — ABNORMAL HIGH (ref 70–99)
Potassium: 4.2 mmol/L (ref 3.5–5.1)
Sodium: 134 mmol/L — ABNORMAL LOW (ref 135–145)
Total Bilirubin: 0.6 mg/dL (ref 0.3–1.2)
Total Protein: 6.9 g/dL (ref 6.5–8.1)

## 2023-02-06 LAB — CBC
HCT: 35.5 % — ABNORMAL LOW (ref 39.0–52.0)
Hemoglobin: 11.7 g/dL — ABNORMAL LOW (ref 13.0–17.0)
MCH: 29.3 pg (ref 26.0–34.0)
MCHC: 33 g/dL (ref 30.0–36.0)
MCV: 88.8 fL (ref 80.0–100.0)
Platelets: 283 10*3/uL (ref 150–400)
RBC: 4 MIL/uL — ABNORMAL LOW (ref 4.22–5.81)
RDW: 17.4 % — ABNORMAL HIGH (ref 11.5–15.5)
WBC: 6.3 10*3/uL (ref 4.0–10.5)
nRBC: 0 % (ref 0.0–0.2)

## 2023-02-06 LAB — ECHOCARDIOGRAM COMPLETE
Area-P 1/2: 4.83 cm2
Calc EF: 45.9 %
Height: 60 in
MV M vel: 4.69 m/s
MV Peak grad: 88 mmHg
P 1/2 time: 512 msec
Radius: 0.6 cm
S' Lateral: 3.7 cm
Single Plane A2C EF: 48.3 %
Single Plane A4C EF: 45.2 %
Weight: 2426.82 oz

## 2023-02-06 LAB — HIV ANTIBODY (ROUTINE TESTING W REFLEX): HIV Screen 4th Generation wRfx: NONREACTIVE

## 2023-02-06 LAB — URIC ACID: Uric Acid, Serum: 11.4 mg/dL — ABNORMAL HIGH (ref 3.7–8.6)

## 2023-02-06 LAB — LACTIC ACID, PLASMA: Lactic Acid, Venous: 1.2 mmol/L (ref 0.5–1.9)

## 2023-02-06 MED ORDER — SODIUM CHLORIDE 0.9 % IV SOLN
2.0000 g | INTRAVENOUS | Status: AC
  Administered 2023-02-07 – 2023-02-10 (×4): 2 g via INTRAVENOUS
  Filled 2023-02-06 (×4): qty 20

## 2023-02-06 MED ORDER — PREDNISONE 50 MG PO TABS
50.0000 mg | ORAL_TABLET | Freq: Every day | ORAL | Status: DC
  Administered 2023-02-07 – 2023-02-09 (×3): 50 mg via ORAL
  Filled 2023-02-06 (×3): qty 1

## 2023-02-06 MED ORDER — HYDROMORPHONE HCL 1 MG/ML IJ SOLN
0.5000 mg | INTRAMUSCULAR | Status: DC | PRN
  Administered 2023-02-06 (×2): 0.5 mg via INTRAVENOUS
  Filled 2023-02-06 (×2): qty 0.5

## 2023-02-06 MED ORDER — LIDOCAINE 5 % EX PTCH
1.0000 | MEDICATED_PATCH | CUTANEOUS | Status: DC
  Administered 2023-02-06 – 2023-02-10 (×5): 1 via TRANSDERMAL
  Filled 2023-02-06 (×5): qty 1

## 2023-02-06 MED ORDER — IPRATROPIUM-ALBUTEROL 0.5-2.5 (3) MG/3ML IN SOLN
3.0000 mL | Freq: Four times a day (QID) | RESPIRATORY_TRACT | Status: DC | PRN
  Administered 2023-02-08 – 2023-02-10 (×3): 3 mL via RESPIRATORY_TRACT
  Filled 2023-02-06 (×3): qty 3

## 2023-02-06 MED ORDER — OXYCODONE HCL 5 MG PO TABS
5.0000 mg | ORAL_TABLET | Freq: Four times a day (QID) | ORAL | Status: DC | PRN
  Administered 2023-02-08: 5 mg via ORAL
  Filled 2023-02-06: qty 1

## 2023-02-06 MED ORDER — POLYETHYLENE GLYCOL 3350 17 G PO PACK
17.0000 g | PACK | Freq: Every day | ORAL | Status: DC
  Administered 2023-02-06 – 2023-02-09 (×3): 17 g via ORAL
  Filled 2023-02-06 (×4): qty 1

## 2023-02-06 MED ORDER — SENNOSIDES-DOCUSATE SODIUM 8.6-50 MG PO TABS
1.0000 | ORAL_TABLET | Freq: Two times a day (BID) | ORAL | Status: DC
  Administered 2023-02-06 – 2023-02-08 (×5): 1 via ORAL
  Filled 2023-02-06 (×5): qty 1

## 2023-02-06 MED ORDER — MIDODRINE HCL 5 MG PO TABS
5.0000 mg | ORAL_TABLET | ORAL | Status: AC
  Administered 2023-02-06: 5 mg via ORAL
  Filled 2023-02-06: qty 1

## 2023-02-06 MED ORDER — METHYLPREDNISOLONE SODIUM SUCC 125 MG IJ SOLR
125.0000 mg | Freq: Once | INTRAMUSCULAR | Status: AC
  Administered 2023-02-06: 125 mg via INTRAVENOUS
  Filled 2023-02-06: qty 2

## 2023-02-06 MED ORDER — ALBUMIN HUMAN 5 % IV SOLN
25.0000 g | Freq: Once | INTRAVENOUS | Status: AC
  Administered 2023-02-06: 25 g via INTRAVENOUS
  Filled 2023-02-06: qty 500

## 2023-02-06 NOTE — Progress Notes (Signed)
PROGRESS NOTE  Ronald [name]  [id number] DOB: [birth year] DOA: 02/04/2023 PCP: System, Provider Not In   Brief Narrative: Patient is a 67 year old male with history of  COPD, coronary artery disease, lung cancer status post left pneumonectomy/chemo/radiation who presented with complaint of pleuritic chest pain, fatigue, chills, dyspnea, wheezing, productive cough but no fever.  Initial vital signs on presentation were stable.  COVID/flu/RSV PCR negative.  Lab work showed elevated BNP of 755, creatinine of 1.3.  Chest x-ray showed interstitial marking in the right lower lung field suspicious for pneumonia.  CT angiogram showed emphysematous changes, possible infiltrate in the right lower lobe, stable 2 cm right middle lobe spiculated nodule.  Patient was admitted for the management of community-acquired pneumonia, started on ceftriaxone and azithromycin, put on 2 L of oxygen.  Assessment & Plan:  Principal Problem:   CAP (community acquired pneumonia) Active Problems:   CAD (coronary artery disease)   Mitral regurgitation   Chronic systolic CHF (congestive heart failure)   COPD exacerbation (HCC)   Essential hypertension   Gastroesophageal reflux disease without esophagitis   Hypothyroidism (acquired)   Mixed hyperlipidemia  Community-acquired pneumonia: Presented with fatigue, pleuritic chest pain, chills, wheezing, productive cough.Chest x-ray showed interstitial marking in the right lower lung field suspicious for pneumonia.  CT angiogram showed emphysematous changes, possible infiltrate in the right lower lobe, stable 2 cm right middle lobe spiculated nodule.  Patient was admitted for the management of community-acquired pneumonia, started on ceftriaxone and azithromycin, put on 2 L of oxygen. Follow-up blood cultures.  Negative streptococcal pneumoniae, pending Legionella antigen in urine.  Lactic acid level normal.  Chronic hypoxic respiratory failure: On 2 L of oxygen at  baseline.  Currently on 2 L of oxygen per minute.   COPD exacerbation: Secondary to pneumonia.  Continue current medications.  No wheezing auscultated today.  Continue as needed bronchodilators.  History of left lung cancer: History of stage IV non-small cell lung cancer, right middle and right upper lobe pulmonary nodules.follows with Dr. Julien Nordmann  History of left pneumonectomy.  Currently on chemotherapy: Carboplatin, paclitaxel, Keytruda  Left knee pain: This is a main problem now.  His knee appears tender and edematous.  Complains of severe pain.  Continue pain management, supportive care.  X-ray of the left knee did not show any fracture or dislocation, mild degenerative changes.  Will check uric acid level.  Denies any history of gout.  Coronary artery disease: No anginal symptoms.  On statin, ACE inhibitor at home.  Mild elevated troponin with flat trend, likely from supply demand ischemia, no concern for ACS. Last echo showed EF of 50 to 55%, currently looks euvolemic.  New echo has been ordered  Hypertension: Blood pressure soft, antihypertensives on hold  GERD: Continue PPI  Hypothyroidism: Continue Synthyroid  Hyperlipidemia: On Crestor  CKD stage IIIa: Baseline creatinine ranges from 1.3-1.6.  Currently kidney function at baseline.  Debility/deconditioning: Will consult PT/OT:         DVT prophylaxis:enoxaparin (LOVENOX) injection 40 mg Start: 02/05/23 2200     Code Status: Full Code  Family Communication: Called Sister Deneise Lever for update on 3/24, call not received  Patient status:Inpatient  Patient is from :Home  Anticipated discharge [id number]  Estimated DC date:2-3 days   Consultants: None  Procedures:None  Antimicrobials:  Anti-infectives (From admission, onward)    Start     Dose/Rate Route Frequency Ordered Stop   02/07/23 0300  cefTRIAXone (ROCEPHIN) 2 g in sodium chloride 0.9 % 100 mL IVPB  2 g 200 mL/hr over 30 Minutes Intravenous Every 24  hours 02/06/23 0815 02/11/23 0259   02/06/23 0300  cefTRIAXone (ROCEPHIN) 1 g in sodium chloride 0.9 % 100 mL IVPB  Status:  Discontinued        1 g 200 mL/hr over 30 Minutes Intravenous Every 24 hours 02/05/23 0808 02/06/23 0815   02/06/23 0300  azithromycin (ZITHROMAX) 500 mg in sodium chloride 0.9 % 250 mL IVPB        500 mg 250 mL/hr over 60 Minutes Intravenous Every 24 hours 02/05/23 0808 02/11/23 0259   02/05/23 0200  cefTRIAXone (ROCEPHIN) 1 g in sodium chloride 0.9 % 100 mL IVPB        1 g 200 mL/hr over 30 Minutes Intravenous  Once 02/05/23 0153 02/05/23 0315   02/05/23 0200  azithromycin (ZITHROMAX) tablet 500 mg        500 mg Oral  Once 02/05/23 0153 02/05/23 0245       Subjective: Patient seen and examined at bedside today.  On 2 L of oxygen.  Denies any worsening shortness of breath or cough.  His main problem is the left knee which is edematous and tender.  Denies any history of fall/trauma or gout.  Objective: Vitals:   02/06/23 0321 02/06/23 0330 02/06/23 0527 02/06/23 0532  BP: (!) 80/60 (!) 76/58 (!) 86/63 (!) 102/54  Pulse:   82   Resp:   20   Temp:   98 F (36.7 C)   TempSrc:   Oral   SpO2:   96%   Weight:      Height:        Intake/Output Summary (Last 24 hours) at 02/06/2023 0816 Last data filed at 02/06/2023 0500 Gross per 24 hour  Intake 387.8 ml  Output 990 ml  Net -602.2 ml   Filed Weights   02/05/23 0131  Weight: 68.8 kg    Examination:  General exam: Uncomfortable due to left knee pain, appears deconditioned, weak HEENT: PERRL Respiratory system:  no wheezes or crackles  Cardiovascular system: S1 & S2 heard, RRR.  Gastrointestinal system: Abdomen is nondistended, soft and nontender. Central nervous system: Alert and oriented Extremities: No edema, no clubbing ,no cyanosis, tender, edematous left knee Skin: No rashes, no ulcers,no icterus     Data Reviewed: I have personally reviewed following labs and imaging studies  CBC: Recent  Labs  Lab 02/04/23 1944 02/06/23 0513  WBC 6.7 6.3  HGB 12.8* 11.7*  HCT 38.5* 35.5*  MCV 89.7 88.8  PLT 306 [id number]   Basic Metabolic Panel: Recent Labs  Lab 02/04/23 1944 02/06/23 0513  NA 134* 134*  K 4.0 4.2  CL 106 104  CO2 18* 21*  GLUCOSE 96 116*  BUN 20 24*  CREATININE 1.34* 1.58*  CALCIUM 9.0 8.6*     Recent Results (from the past 240 hour(s))  Resp panel by RT-PCR (RSV, Flu A&B, Covid) Anterior Nasal Swab     Status: None   Collection Time: 02/04/23 11:51 PM   Specimen: Anterior Nasal Swab  Result Value Ref Range Status   SARS Coronavirus 2 by RT PCR NEGATIVE NEGATIVE Final    Comment: (NOTE) SARS-CoV-2 target nucleic acids are NOT DETECTED.  The SARS-CoV-2 RNA is generally detectable in upper respiratory specimens during the acute phase of infection. The lowest concentration of SARS-CoV-2 viral copies this assay can detect is 138 copies/mL. A negative result does not preclude SARS-Cov-2 infection and should not be used as the sole basis  for treatment or other patient management decisions. A negative result may occur with  improper specimen collection/handling, submission of specimen other than nasopharyngeal swab, presence of viral mutation(s) within the areas targeted by this assay, and inadequate number of viral copies(<138 copies/mL). A negative result must be combined with clinical observations, patient history, and epidemiological information. The expected result is Negative.  Fact Sheet for Patients:  EntrepreneurPulse.com.au  Fact Sheet for Healthcare Providers:  IncredibleEmployment.be  This test is no t yet approved or cleared by the [place] FDA and  has been authorized for detection and/or diagnosis of SARS-CoV-2 by FDA under an Emergency Use Authorization (EUA). This EUA will remain  in effect (meaning this test can be used) for the duration of the COVID-19 declaration under Section 564(b)(1) of the  Act, 21 U.S.C.section 360bbb-3(b)(1), unless the authorization is terminated  or revoked sooner.       Influenza A by PCR NEGATIVE NEGATIVE Final   Influenza B by PCR NEGATIVE NEGATIVE Final    Comment: (NOTE) The Xpert Xpress SARS-CoV-2/FLU/RSV plus assay is intended as an aid in the diagnosis of influenza from Nasopharyngeal swab specimens and should not be used as a sole basis for treatment. Nasal washings and aspirates are unacceptable for Xpert Xpress SARS-CoV-2/FLU/RSV testing.  Fact Sheet for Patients: EntrepreneurPulse.com.au  Fact Sheet for Healthcare Providers: IncredibleEmployment.be  This test is not yet approved or cleared by the [place] FDA and has been authorized for detection and/or diagnosis of SARS-CoV-2 by FDA under an Emergency Use Authorization (EUA). This EUA will remain in effect (meaning this test can be used) for the duration of the COVID-19 declaration under Section 564(b)(1) of the Act, 21 U.S.C. section 360bbb-3(b)(1), unless the authorization is terminated or revoked.     Resp Syncytial Virus by PCR NEGATIVE NEGATIVE Final    Comment: (NOTE) Fact Sheet for Patients: EntrepreneurPulse.com.au  Fact Sheet for Healthcare Providers: IncredibleEmployment.be  This test is not yet approved or cleared by the [place] FDA and has been authorized for detection and/or diagnosis of SARS-CoV-2 by FDA under an Emergency Use Authorization (EUA). This EUA will remain in effect (meaning this test can be used) for the duration of the COVID-19 declaration under Section 564(b)(1) of the Act, 21 U.S.C. section 360bbb-3(b)(1), unless the authorization is terminated or revoked.  Performed at [name], [place] [address], [place], [place] [address]   Blood culture (routine x 2)     Status: None (Preliminary result)   Collection Time: 02/05/23  4:01 AM   Specimen:  BLOOD  Result Value Ref Range Status   Specimen Description   Final    BLOOD BLOOD RIGHT HAND Performed at [facility] [address], [place], [place] [address]    Special Requests   Final    BOTTLES DRAWN AEROBIC AND ANAEROBIC Blood Culture adequate volume Performed at [facility] [address], [place], [place] [address]    Culture   Final    NO GROWTH < 12 HOURS Performed at [facility] [address], [place], [place] [address]    Report Status PENDING  Incomplete  Blood culture (routine x 2)     Status: None (Preliminary result)   Collection Time: 02/05/23  4:03 AM   Specimen: BLOOD  Result Value Ref Range Status   Specimen Description   Final    BLOOD BLOOD RIGHT FOREARM Performed at [facility] [address] [place],  [address]  Special Requests   Final    BOTTLES DRAWN AEROBIC AND ANAEROBIC Blood Culture adequate volume Performed at [facility] [address], [place], [place] [address]    Culture   Final    NO GROWTH < 12 HOURS Performed at [facility] [address] [place], [place] [address]    Report Status PENDING  Incomplete     Radiology Studies: CT Angio Chest PE W and/or Wo Contrast  Result Date: 02/04/2023 CLINICAL DATA:  Chest pain shortness of breath EXAM: CT ANGIOGRAPHY CHEST WITH CONTRAST TECHNIQUE: Multidetector CT imaging of the chest was performed using the standard protocol during bolus administration of intravenous contrast. Multiplanar CT image reconstructions and MIPs were obtained to evaluate the vascular anatomy. RADIATION DOSE REDUCTION: This exam was performed according to the departmental dose-optimization program which includes automated exposure control, adjustment of the mA and/or kV according to patient size and/or use of iterative reconstruction technique. CONTRAST:  64mL OMNIPAQUE IOHEXOL 350 MG/ML SOLN COMPARISON:  Chest x-ray  02/04/2023, CT chest 01/27/2023, 12/16/2022 FINDINGS: Cardiovascular: Satisfactory opacification of the pulmonary arteries to the segmental level. No evidence of pulmonary embolism on the right. Cardiomegaly. Moderate aortic atherosclerosis without aneurysm. Coronary stent and calcifications. No significant pericardial effusion. Mediastinum/Nodes: Patent trachea. No suspicious thyroid nodule. Shift of mediastinal contents to the left due to history of left pneumonectomy. Esophagus within normal limits. Enlarged right hilar nodes measuring up to 14 mm not significantly changed. Esophagus within normal limits. Lungs/Pleura: Left pneumonectomy with chronic thick-walled fluid in the left thoracic cavity. Emphysema. Diffuse reticular densities throughout the right lung. Spiculated right middle lobe mass measuring 1.6 by 2 cm, previously 2 x 1.8 cm with adjacent fiducial markers. Increased hazy pulmonary density in the right lower lobe compared to prior. Upper Abdomen: No acute finding. Reflux of contrast into the hepatic veins consistent with elevated right heart pressure. Musculoskeletal: Postsurgical changes of the left ribs. No acute or suspicious osseous abnormality. Review of the MIP images confirms the above findings. IMPRESSION: 1. Status post left pneumonectomy. No evidence for acute right pulmonary embolus. 2. Cardiomegaly 3. Emphysema with diffuse interstitial densities throughout the right thorax, similar to previous exams. Slight increased hazy pulmonary density in the right lower lobe could be due to superimposed infectious or inflammatory process. 4. Grossly stable 2 cm right middle lobe spiculated nodule with adjacent fiducial markers Aortic Atherosclerosis (ICD10-I70.0) and Emphysema (ICD10-J43.9). Electronically Signed   By: Donavan Foil M.D.   On: 02/04/2023 23:30   DG Chest Portable 1 View  Result Date: 02/04/2023 CLINICAL DATA:  Chest pain, shortness of breath EXAM: PORTABLE CHEST 1 VIEW  COMPARISON:  Previous studies including the chest radiograph done on 12/27/2022 and CT done on 01/27/2023 FINDINGS: There is previous left pneumonectomy. There are few metallic clips in right lower lung field, possibly fiducial markers from previous biopsy increased interstitial markings are seen in right lower lung field. There is interval improvement in the aeration in right upper lung field since 01/27/2023. There is no focal consolidation. Right lateral CP angle is clear. There is no pneumothorax. Tip of left IJ central venous catheter is seen in superior vena cava. IMPRESSION: Increased interstitial markings in right lower lung field may suggest scarring or interstitial pneumonia. There is no focal consolidation. There is no right pleural effusion or pneumothorax. Previous left pneumonectomy. Electronically Signed   By: Elmer Picker M.D.   On: 02/04/2023 20:05    Scheduled Meds:  acetaminophen  1,000 mg Oral  Once   aspirin EC  81 mg Oral Daily   enoxaparin (LOVENOX) injection  40 mg Subcutaneous Q24H   gabapentin  300 mg Oral TID   levothyroxine  75 mcg Oral Daily   mometasone-formoterol  2 puff Inhalation BID   montelukast  10 mg Oral QHS   rosuvastatin  5 mg Oral Daily   Continuous Infusions:  azithromycin 500 mg (02/06/23 0224)   [START ON 02/07/2023] cefTRIAXone (ROCEPHIN)  IV       LOS: 1 day   Shelly Coss, MD [facility] P3/24/2024, 8:16 AM

## 2023-02-06 NOTE — Plan of Care (Signed)

## 2023-02-06 NOTE — Progress Notes (Signed)
OT Cancellation Note  Patient Details Name: Ronald [name] MRN: [id number] DOB: [birth year]   Cancelled Treatment:    Reason Eval/Treat Not Completed: Patient at procedure or test/ unavailable Echo at bedside. Will follow up for OT eval as schedule permits.  Layla Maw 02/06/2023, 1:54 PM

## 2023-02-06 NOTE — Progress Notes (Signed)
Echocardiogram 2D Echocardiogram has been performed.  Ronald [name] 02/06/2023, 2:27 PM

## 2023-02-06 NOTE — Progress Notes (Signed)
Called d/t low B/P, [facility], NP at bedside see orders. Pt A/O and doesn't appear to be in distress.

## 2023-02-06 NOTE — Progress Notes (Signed)
    Patient Name: KHRISTIAN BOLAND           DOB: 18-Jun-1956  MRN: [id number]      Admission Date: 02/04/2023  Attending Provider: Reubin Milan, MD  Primary Diagnosis: CAP (community acquired pneumonia)   Level of care: Telemetry    CROSS COVER NOTE   Date of Service   02/06/2023   CULLAN FEARRINGTON, 67 y.o. male, was admitted on 02/04/2023 for CAP (community acquired pneumonia).    HPI/Events of Note   0000-soft BP 85/56.    Patient has a known history of CHF and hypertension.  No pitting edema or adventitious lung sounds reported.  Does not appear fluid overloaded.  No BP problems noted during admission, SBP ~90-120s. Patient recently received oxycodone and Ativan dose. Order placed for 250 cc bolus.    0330- RN reports BP 76/58 (65).  No acute changes reported.  Patient does not appear to be in any distress.  A/O x4.  Currently denies chest pain, palpitations, SOB, abdominal pain, nausea, vomiting.  Orders placed for albumin and oral midodrine.  0530- BP improving 102/54.  Hold antihypertensives for now.   Interventions/ Plan   Above        Raenette Rover, DNP, [place]

## 2023-02-07 ENCOUNTER — Inpatient Hospital Stay (HOSPITAL_COMMUNITY): Payer: 59

## 2023-02-07 DIAGNOSIS — J189 Pneumonia, unspecified organism: Secondary | ICD-10-CM | POA: Diagnosis not present

## 2023-02-07 LAB — BASIC METABOLIC PANEL
Anion gap: 12 (ref 5–15)
BUN: 38 mg/dL — ABNORMAL HIGH (ref 8–23)
CO2: 19 mmol/L — ABNORMAL LOW (ref 22–32)
Calcium: 8.2 mg/dL — ABNORMAL LOW (ref 8.9–10.3)
Chloride: 99 mmol/L (ref 98–111)
Creatinine, Ser: 2.37 mg/dL — ABNORMAL HIGH (ref 0.61–1.24)
GFR, Estimated: 30 mL/min — ABNORMAL LOW (ref >60)
Glucose, Bld: 191 mg/dL — ABNORMAL HIGH (ref 70–99)
Potassium: 4.9 mmol/L (ref 3.5–5.1)
Sodium: 130 mmol/L — ABNORMAL LOW (ref 135–145)

## 2023-02-07 LAB — CBC
HCT: 33.6 % — ABNORMAL LOW (ref 39.0–52.0)
Hemoglobin: 11.2 g/dL — ABNORMAL LOW (ref 13.0–17.0)
MCH: 29.6 pg (ref 26.0–34.0)
MCHC: 33.3 g/dL (ref 30.0–36.0)
MCV: 88.9 fL (ref 80.0–100.0)
Platelets: 266 10*3/uL (ref 150–400)
RBC: 3.78 MIL/uL — ABNORMAL LOW (ref 4.22–5.81)
RDW: 17.3 % — ABNORMAL HIGH (ref 11.5–15.5)
WBC: 7.4 10*3/uL (ref 4.0–10.5)
nRBC: 0 % (ref 0.0–0.2)

## 2023-02-07 MED ORDER — AZITHROMYCIN 250 MG PO TABS
500.0000 mg | ORAL_TABLET | Freq: Every day | ORAL | Status: AC
  Administered 2023-02-07 – 2023-02-08 (×2): 500 mg via ORAL
  Filled 2023-02-07 (×2): qty 2

## 2023-02-07 MED ORDER — BISACODYL 10 MG RE SUPP
10.0000 mg | Freq: Once | RECTAL | Status: AC
  Administered 2023-02-07: 10 mg via RECTAL
  Filled 2023-02-07: qty 1

## 2023-02-07 MED ORDER — ENOXAPARIN SODIUM 30 MG/0.3ML IJ SOSY
30.0000 mg | PREFILLED_SYRINGE | INTRAMUSCULAR | Status: DC
  Administered 2023-02-07: 30 mg via SUBCUTANEOUS
  Filled 2023-02-07: qty 0.3

## 2023-02-07 MED ORDER — SODIUM CHLORIDE 0.9 % IV SOLN: INTRAVENOUS | Status: DC

## 2023-02-07 MED ORDER — MIDODRINE HCL 5 MG PO TABS
5.0000 mg | ORAL_TABLET | Freq: Three times a day (TID) | ORAL | Status: DC
  Administered 2023-02-08 – 2023-02-10 (×7): 5 mg via ORAL
  Filled 2023-02-07 (×7): qty 1

## 2023-02-07 MED ORDER — BISACODYL 5 MG PO TBEC
5.0000 mg | DELAYED_RELEASE_TABLET | Freq: Every day | ORAL | Status: DC | PRN
  Administered 2023-02-07: 5 mg via ORAL
  Filled 2023-02-07: qty 1

## 2023-02-07 NOTE — Evaluation (Signed)
Physical Therapy Evaluation Patient Details Name: Ronald [name] MRN: [id number] DOB: [birth year] Today's Date: 02/07/2023  History of Present Illness  67 year old male with history of  COPD (on 1-2L home O2), coronary artery disease, lung cancer status post left pneumonectomy/chemo/radiation who presented with complaint of pleuritic chest pain, fatigue, chills, dyspnea, wheezing, productive cough but no fever. Dx of PNA. Pt also c/o L knee pain, xray negative for fracture, uric acids levels noted to be elevated.  Clinical Impression  Pt admitted with above diagnosis. Pt ambulated 48' holding IV pole and handrail in hall (pt had declined use of RW), SpO2 97% on 3L O2 ambulating, 6/10 L knee pain with ambulation. Pt was encouraged to use a walker for energy conservation and for balance in the future, he's agreeable. Rollator ordered for home DME.  Pt currently with functional limitations due to the deficits listed below (see PT Problem List). Pt will benefit from acute skilled PT to increase their independence and safety with mobility to allow discharge.          Recommendations for follow up therapy are one component of a multi-disciplinary discharge planning process, led by the attending physician.  Recommendations may be updated based on patient status, additional functional criteria and insurance authorization.  Follow Up Recommendations       Assistance Recommended at Discharge Intermittent Supervision/Assistance  Patient can return home with the following  A little help with bathing/dressing/bathroom;Assist for transportation;Help with stairs or ramp for entrance    Equipment Recommendations Rollator (4 wheels)  Recommendations for Other Services       Functional Status Assessment Patient has had a recent decline in their functional status and demonstrates the ability to make significant improvements in function in a reasonable and predictable amount of time.     Precautions /  Restrictions Precautions Precautions: Fall Precaution Comments: monitor O2 Restrictions Weight Bearing Restrictions: No      Mobility  Bed Mobility Overal bed mobility: Needs Assistance Bed Mobility: Supine to Sit     Supine to sit: Supervision     General bed mobility comments: with HOB raised, used rail    Transfers Overall transfer level: Needs assistance Equipment used: None Transfers: Sit to/from Stand Sit to Stand: Min guard                Ambulation/Gait Ambulation/Gait assistance: Counsellor (Feet): 48 Feet Assistive device: IV Pole Gait Pattern/deviations: Step-through pattern, Decreased stride length Gait velocity: decr     General Gait Details: pt declined RW but was reaching for handrail and IV pole while ambulating, recommended pt use RW in the future when ambulating, distance limited by dyspnea and L knee pain, 1 standing rest break 2* 3/4 dyspena, VCs for pursed lip breathing, SpO2 97% on 3L O2 walking.  Stairs            Wheelchair Mobility    Modified Rankin (Stroke Patients Only)       Balance Overall balance assessment: Needs assistance Sitting-balance support: Single extremity supported, Feet supported Sitting balance-Leahy Scale: Good       Standing balance-Leahy Scale: Fair                               Pertinent Vitals/Pain Pain Assessment Pain Score: 6  Pain Location: L knee with walking Pain Descriptors / Indicators: Sharp Pain Intervention(s): Limited activity within patient's tolerance, Monitored during session, Repositioned    Home Living  Family/patient expects to be discharged to:: Private residence Living Arrangements: Other relatives (sister) Available Help at Discharge: Family;Available 24 hours/day Type of Home: Apartment Home Access: Level entry       Home Layout: One level Home Equipment: None Additional Comments: lives with sister who is home 24/7    Prior Function  Prior Level of Function : Independent/Modified Independent                     Hand Dominance   Dominant Hand: Right    Extremity/Trunk Assessment   Upper Extremity Assessment Upper Extremity Assessment: Defer to OT evaluation    Lower Extremity Assessment Lower Extremity Assessment: RLE deficits/detail;LLE deficits/detail RLE Deficits / Details: 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 RLE Sensation: decreased light touch;history of peripheral neuropathy LLE Deficits / Details: 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 LLE Sensation: decreased light touch;history of peripheral neuropathy    Cervical / Trunk Assessment Cervical / Trunk Assessment: Normal  Communication   Communication: No difficulties  Cognition Arousal/Alertness: Awake/alert Behavior During Therapy: WFL for tasks assessed/performed Overall Cognitive Status: Within Functional Limits for tasks assessed                                 General Comments: patient is plesant and cooperative        General Comments      Exercises     Assessment/Plan    PT Assessment Patient needs continued PT services  PT Problem List Decreased activity tolerance;Pain;Decreased balance       PT Treatment Interventions Functional mobility training;Therapeutic activities;Gait training;DME instruction;Therapeutic exercise;Balance training;Patient/family education    PT Goals (Current goals can be found in the Care Plan section)  Acute Rehab PT Goals Patient Stated Goal: to breathe better PT Goal Formulation: With patient/family Time For Goal Achievement: 02/21/23 Potential to Achieve Goals: Good    Frequency Min 3X/week     Co-evaluation               AM-PAC PT "6 Clicks" Mobility  Outcome Measure Help needed turning from your back to your side while in a flat bed without using bedrails?: None Help needed moving from lying on your back to sitting on the side of a flat bed without  using bedrails?: A Little Help needed moving to and from a bed to a chair (including a wheelchair)?: A Little Help needed standing up from a chair using your arms (e.g., wheelchair or bedside chair)?: A Little Help needed to walk in hospital room?: A Little Help needed climbing 3-5 steps with a railing? : A Little 6 Click Score: 19    End of Session Equipment Utilized During Treatment: Gait belt;Oxygen Activity Tolerance: Patient limited by fatigue;Patient limited by pain Patient left: in bed;with call bell/phone within reach;with family/visitor present Nurse Communication: Mobility status PT Visit Diagnosis: Difficulty in walking, not elsewhere classified (R26.2);Pain Pain - Right/Left: Left Pain - part of body: Knee    Time: [id number] PT Time Calculation (min) (ACUTE ONLY): 22 min   Charges:   PT Evaluation $PT Eval Moderate Complexity: 1 Mod        Philomena Doheny PT 02/07/2023  Acute Rehabilitation Services  Office [phone]

## 2023-02-07 NOTE — Evaluation (Signed)
Occupational Therapy Evaluation Patient Details Name: Ronald [name] MRN: [id number] DOB: 1956/02/03 Today's Date: 02/07/2023   History of Present Illness Patient is a 67 year old male who presented on 3/22 with fatigue, chills, dyspnea, wheezing, productive cough and pleuritic chest pain. Patient was admitted with CAP, chronic hypoxic respiratory failure, COPD exacerbation.   Clinical Impression   Patient is a 67 year old male who was admitted for above. Patient was living at home with sister support with independent in ADLs with increased time per patient report. Patient was on 2L/min at home at baseline.  Patient reported increased pain in L knee with xray negative but uric acid levels elevated. Patient was noted to have decreased functional activity tolerance, decreased endurance, decreased standing balance, decreased safety awareness, and decreased knowledge of AD/AE impacting participation in ADLs. Patient would continue to benefit from skilled OT services at this time while admitted and after d/c to address noted deficits in order to improve overall safety and independence in ADLs.       Recommendations for follow up therapy are one component of a multi-disciplinary discharge planning process, led by the attending physician.  Recommendations may be updated based on patient status, additional functional criteria and insurance authorization.   Assistance Recommended at Discharge Frequent or constant Supervision/Assistance  Patient can return home with the following A lot of help with walking and/or transfers;A lot of help with bathing/dressing/bathroom;Assistance with cooking/housework;Direct supervision/assist for medications management;Assist for transportation;Help with stairs or ramp for entrance;Direct supervision/assist for financial management    Functional Status Assessment  Patient has had a recent decline in their functional status and demonstrates the ability to make  significant improvements in function in a reasonable and predictable amount of time.  Equipment Recommendations  None recommended by OT    Recommendations for Other Services       Precautions / Restrictions Precautions Precautions: Fall Precaution Comments: monitor O2 Restrictions Weight Bearing Restrictions: No      Mobility Bed Mobility Overal bed mobility: Needs Assistance Bed Mobility: Supine to Sit     Supine to sit: Min guard     General bed mobility comments: with HOB raised    Transfers                          Balance Overall balance assessment: Needs assistance Sitting-balance support: Single extremity supported, Feet supported Sitting balance-Leahy Scale: Fair       Standing balance-Leahy Scale: Poor           ADL either performed or assessed with clinical judgement   ADL Overall ADL's : Needs assistance/impaired Eating/Feeding: Modified independent;Sitting   Grooming: Wash/dry face;Wash/dry hands;Set up;Sitting Grooming Details (indicate cue type and reason): in recliner Upper Body Bathing: Set up;Sitting Upper Body Bathing Details (indicate cue type and reason): in recliner with increased SOB. patient on 4L/min with O2 maintaining 92% or above. patient was upset with O2 level in room with O2 level set by nursing prior to entrance. patient reported having O2 on 2L/min at home. patients nurse made aware at end of session. Lower Body Bathing: Moderate assistance Lower Body Bathing Details (indicate cue type and reason): patient unable to complete figure four positioning and bending to reach feet/shins leads to increased fatigue. patient reported he does not use AE at home. noted to have some confusion with description of AE and inquired if they stay on him. patient was educated on reacher and sock aid verbally with education on  practice with them during next session. Upper Body Dressing : Set up;Sitting   Lower Body Dressing: Maximal  assistance;Sitting/lateral leans;Sit to/from stand Lower Body Dressing Details (indicate cue type and reason): see above LB bathing comment Toilet Transfer: Minimal assistance;Stand-pivot Toilet Transfer Details (indicate cue type and reason): with no AD with O2 in place with increased time. to ercliner in room with increased pain in L knee. reported it was new since being in hospital. [place] and Hygiene: Sit to/from stand;Moderate assistance       Functional mobility during ADLs: Minimal assistance       Vision   Vision Assessment?: No apparent visual deficits            Pertinent Vitals/Pain Pain Assessment Pain Assessment: 0-10 Pain Score: 4  Pain Location: "around my heart and lungs" Pain Descriptors / Indicators: Throbbing Pain Intervention(s): Limited activity within patient's tolerance, Monitored during session, Repositioned     Hand Dominance Right   Extremity/Trunk Assessment Upper Extremity Assessment Upper Extremity Assessment: Overall WFL for tasks assessed   Lower Extremity Assessment Lower Extremity Assessment: Defer to PT evaluation   Cervical / Trunk Assessment Cervical / Trunk Assessment: Normal   Communication Communication Communication: No difficulties   Cognition Arousal/Alertness: Awake/alert Behavior During Therapy: WFL for tasks assessed/performed Overall Cognitive Status: Within Functional Limits for tasks assessed           General Comments: patient is plesant and cooperative                Home Living Family/patient expects to be discharged to:: Private residence Living Arrangements: Other relatives (sister) Available Help at Discharge: Family;Available 24 hours/day Type of Home: Apartment Home Access: Level entry     Home Layout: One level     Bathroom Shower/Tub: Tub/shower unit         Home Equipment: None   Additional Comments: lives with sister who is home 24/7      Prior  Functioning/Environment Prior Level of Function : Independent/Modified Independent            OT Problem List: Decreased strength;Decreased activity tolerance;Impaired balance (sitting and/or standing);Decreased coordination;Decreased safety awareness;Decreased knowledge of precautions;Pain;Decreased knowledge of use of DME or AE      OT Treatment/Interventions: Self-care/ADL training;Energy conservation;Therapeutic exercise;DME and/or AE instruction;Therapeutic activities;Patient/family education;Balance training    OT Goals(Current goals can be found in the care plan section) Acute Rehab OT Goals Patient Stated Goal: to go home OT Goal Formulation: With patient Time For Goal Achievement: 02/21/23 Potential to Achieve Goals: Fair ADL Goals Pt Will Perform Lower Body Dressing: with supervision;with adaptive equipment;sit to/from stand;sitting/lateral leans Pt Will Transfer to Toilet: with supervision;ambulating;regular height toilet Additional ADL Goal #1: Patient will perform 5 min functional activity or exercise activity as evidence of improving activity tolerance  OT Frequency: Min 2X/week       AM-PAC OT "6 Clicks" Daily Activity     Outcome Measure Help from another person eating meals?: None Help from another person taking care of personal grooming?: A Little Help from another person toileting, which includes using toliet, bedpan, or urinal?: A Lot Help from another person bathing (including washing, rinsing, drying)?: A Lot Help from another person to put on and taking off regular upper body clothing?: A Little Help from another person to put on and taking off regular lower body clothing?: A Lot 6 Click Score: 16   End of Session Equipment Utilized During Treatment: Gait belt Nurse Communication: Mobility status  Activity  Tolerance: Patient limited by pain;Patient limited by fatigue Patient left: in chair;with call bell/phone within reach  OT Visit Diagnosis:  Unsteadiness on feet (R26.81);Other abnormalities of gait and mobility (R26.89);Muscle weakness (generalized) (M62.81);Pain                Time: [id number] OT Time Calculation (min): 24 min Charges:  OT General Charges $OT Visit: 1 Visit OT Evaluation $OT Eval Moderate Complexity: 1 Mod OT Treatments $Self Care/Home Management : 8-22 mins  Rennie Plowman, MS Acute Rehabilitation Department Office# [phone]   Willa Rough 02/07/2023, 12:44 PM

## 2023-02-07 NOTE — TOC Initial Note (Signed)
Transition of Care [facility]) - Initial/Assessment Note    Patient Details  Name: Ronald [name] MRN: [id number] Date of Birth: 1956-07-21  [facility] CM/SW Contact:    Vassie Moselle, LCSW Phone Number: 02/07/2023, 2:25 PM  Clinical Narrative:                 Met with pt and family at bedside. Pt currently lives in an apartment with his sister. Pt typically ambulates independently at home. Pt has not had HH in the past but, shares his PCP at [facility] has been working to refer him for home health. Pt agreeable to CSW arranging [place] and has requested PT,OT,Aide,SW. Pt has no DME at home. Pt is agreeable to having rollator ordered and delivered to his room prior to discharge.  Pt screened for food insecurity. Pt denies having food insecurity but, is agreeable to having referrals placed for food banks. Referral placed through [place].   Expected Discharge Plan: [place] Barriers to Discharge: No Barriers Identified   Patient Goals and CMS Choice Patient states their goals for this hospitalization and ongoing recovery are:: To return home CMS Medicare.gov Compare Post Acute Care list provided to:: Patient Choice offered to / list presented to : Patient Young ownership interest in [facility].provided to:: Patient    Expected Discharge Plan and Services In-house Referral: NA Discharge Planning Services: NA Post Acute Care Choice: Home Health, Durable Medical Equipment Living arrangements for the past 2 months: Apartment                                      Prior Living Arrangements/Services Living arrangements for the past 2 months: Apartment Lives with:: Siblings Patient language and need for interpreter reviewed:: Yes Do you feel safe going back to the place where you live?: Yes      Need for Family Participation in Patient Care: No (Comment) Care giver support system in place?: No (comment)   Criminal  Activity/Legal Involvement Pertinent to Current Situation/Hospitalization: No - Comment as needed  Activities of Daily Living Home Assistive Devices/Equipment: None ADL Screening (condition at time of admission) Patient's cognitive ability adequate to safely complete daily activities?: Yes Is the patient deaf or have difficulty [name]?: No Does the patient have difficulty seeing, even when wearing glasses/contacts?: No Does the patient have difficulty concentrating, remembering, or making decisions?: No Patient able to express need for assistance with ADLs?: Yes Does the patient have difficulty dressing or bathing?: No Independently performs ADLs?: Yes (appropriate for developmental age) Does the patient have difficulty walking or climbing stairs?: No Weakness of Legs: None Weakness of Arms/Hands: None  Permission Sought/Granted   Permission granted to share information with : Yes, Verbal Permission Granted     Permission granted to share info w AGENCY: DME and HHA        Emotional Assessment Appearance:: Appears stated age Attitude/Demeanor/Rapport: Engaged Affect (typically observed): Accepting, Pleasant Orientation: : Oriented to Self, Oriented to Place, Oriented to  Time, Oriented to Situation Alcohol / Substance Use: Not Applicable Psych Involvement: No (comment)  Admission diagnosis:  Tachypnea [R06.82] SOB (shortness of breath) [R06.02] CAP (community acquired pneumonia) [J18.9] Patient Active Problem List   Diagnosis Date Noted   Port-A-Cath in place 12/15/2022   Gastroesophageal reflux disease without esophagitis 10/15/2022   Mixed hyperlipidemia 10/15/2022   Hypothyroidism (acquired) 10/09/2022  Nonrheumatic aortic valve stenosis 08/27/2022   Peripheral neuropathy 08/07/2022   Carcinoma in situ of eye 09/07/2021   Acute on chronic congestive heart failure ([place]) 12/30/2017   Hypotension 12/30/2017   History of pneumonectomy    Odynophagia    CAP (community  acquired pneumonia) 01/28/2017   AKI (acute kidney injury) ([place]) 01/28/2017   Unstable angina ([place]) 10/10/2015   COPD exacerbation ([place]) 07/04/2015   Essential hypertension 07/04/2015   Acute respiratory failure with hypoxia ([place]) 07/04/2015   lung ca    Major depressive disorder, single episode 05/01/2015   Drug overdose, intentional ([place])    Chronic systolic CHF (congestive heart failure) 10/30/2013   COPD with acute exacerbation ([place]) 09/16/2012   Tobacco use 09/16/2012   COPD (chronic obstructive pulmonary disease) ([place]) 09/15/2012   Status post cardiac catheterization [id number]   Acute systolic CHF (congestive heart failure) ([place]) 04/08/2012   Tobacco abuse 04/08/2012   Acute bronchitis 04/08/2012   CAD (coronary artery disease) 04/06/2012   Malignant neoplasm of unspecified part of unspecified bronchus or lung ([place]) 04/06/2012   Mitral regurgitation 04/06/2012   Cocaine abuse ([place]) 04/06/2012   PCP:  System, Provider Not In Pharmacy:   [facility], [place] - [address] [place] [address] Phone: [phone] [phone]     Social Determinants of Health (SDOH) Social History: SDOH Screenings   Food Insecurity: Food Insecurity Present (02/05/2023)  Housing: Low Risk  (02/05/2023)  Transportation Needs: No Transportation Needs (02/05/2023)  Utilities: Not At Risk (02/05/2023)  Tobacco Use: Medium Risk (02/04/2023)   SDOH Interventions:     Readmission Risk Interventions    02/07/2023    2:23 PM  Readmission Risk Prevention Plan  Transportation Screening Complete  Medication Review ([place]) Complete  PCP or Specialist appointment within 3-5 days of discharge Complete  HRI or [place] Complete  SW Recovery Care/Counseling Consult Complete  Palliative Care Screening Not [place] Not Applicable

## 2023-02-07 NOTE — Progress Notes (Addendum)
PROGRESS NOTE  Ronald [name]  [id number] DOB: 23-Nov-1955 DOA: 02/04/2023 PCP: System, Provider Not In   Brief Narrative: Patient is a 67 year old male with history of  COPD  On 2 L of oxygen at home, coronary artery disease, lung cancer status post left pneumonectomy/chemo/radiation who presented with complaint of pleuritic chest pain, fatigue, chills, dyspnea, wheezing, productive cough but no fever.  Initial vital signs on presentation were stable.  COVID/flu/RSV PCR negative.  Lab work showed elevated BNP of 755, creatinine of 1.3.  Chest x-ray showed interstitial marking in the right lower lung field suspicious for pneumonia.  CT angiogram showed emphysematous changes, possible infiltrate in the right lower lobe, stable 2 cm right middle lobe spiculated nodule.  Patient was admitted for the management of community-acquired pneumonia, started on ceftriaxone and azithromycin.  Assessment & Plan:  Principal Problem:   CAP (community acquired pneumonia) Active Problems:   CAD (coronary artery disease)   Mitral regurgitation   Chronic systolic CHF (congestive heart failure)   COPD exacerbation (HCC)   Essential hypertension   Gastroesophageal reflux disease without esophagitis   Hypothyroidism (acquired)   Mixed hyperlipidemia  Community-acquired pneumonia: Presented with fatigue, pleuritic chest pain, chills, wheezing, productive cough.Chest x-ray showed interstitial marking in the right lower lung field suspicious for pneumonia.  CT angiogram showed emphysematous changes, possible infiltrate in the right lower lobe, stable 2 cm right middle lobe spiculated nodule.  Patient was admitted for the management of community-acquired pneumonia, started on ceftriaxone and azithromycin.Follow-up blood cultures.NGTD.  Negative streptococcal pneumoniae, pending Legionella antigen in urine.  Lactic acid level normal.  Chronic hypoxic respiratory failure: On 2 L of oxygen at baseline.  Currently  on 2 L of oxygen per minute.   COPD exacerbation: Secondary to pneumonia.  Continue current medications.  Has mild bilateral expiratory wheezing.  Continue prednisone which was ordered for possible gouty arthritis of left knee.  Continue as needed bronchodilators.  History of left lung cancer: History of stage IV non-small cell lung cancer, right middle and right upper lobe pulmonary nodules.follows with Dr. Julien Nordmann  History of left pneumonectomy.  Currently on chemotherapy: Carboplatin, paclitaxel, Keytruda  Left knee pain: Complain of severe pain on 3/24, left knee found to be tender and edematous. [place] Locks of the left knee did not show any fracture or dislocation, mild degenerative changes.  Elevated uric acid level.  Denies any history of gout.  Started on prednisone.  Knee looks much better today.  Coronary artery disease: No anginal symptoms.  On statin, ACE inhibitor at home.  Mild elevated troponin with flat trend, likely from supply demand ischemia, no concern for ACS. Last echo showed EF of 50 to 55%, currently looks euvolemic.  New echo has been ordered  Hypertension: Blood pressure soft, antihypertensives on hold  GERD: Continue PPI  Hypothyroidism: Continue Synthyroid  Hyperlipidemia: On Crestor  AKI on CKD stage IIIa: Baseline creatinine ranges from 1.3-1.6.  Creatinine bumped up to the range of 2.3 today.  Will start on gentle IV fluids.Likely secondary to low blood pressure on 3/24.  Will check urine sodium and renal ultrasound.  Constipation: Continue bowel regimen  Debility/deconditioning: Consulted  PT/OT:         DVT prophylaxis:enoxaparin (LOVENOX) injection 30 mg Start: 02/07/23 2200     Code Status: Full Code  Family Communication: Called Sister Deneise Lever for update on 3/25  Patient status:Inpatient  Patient is from :Home  Anticipated discharge RC:[id number]  Estimated DC date:1-2 days   Consultants: None  Procedures:None  Antimicrobials:   Anti-infectives (From admission, onward)    Start     Dose/Rate Route Frequency Ordered Stop   02/07/23 2200  azithromycin (ZITHROMAX) tablet 500 mg        500 mg Oral Daily at bedtime 02/07/23 0804 02/09/23 2159   02/07/23 0200  cefTRIAXone (ROCEPHIN) 2 g in sodium chloride 0.9 % 100 mL IVPB        2 g 200 mL/hr over 30 Minutes Intravenous Every 24 hours 02/06/23 0815 02/11/23 0159   02/06/23 0300  cefTRIAXone (ROCEPHIN) 1 g in sodium chloride 0.9 % 100 mL IVPB  Status:  Discontinued        1 g 200 mL/hr over 30 Minutes Intravenous Every 24 hours 02/05/23 0808 02/06/23 0815   02/06/23 0300  azithromycin (ZITHROMAX) 500 mg in sodium chloride 0.9 % 250 mL IVPB  Status:  Discontinued        500 mg 250 mL/hr over 60 Minutes Intravenous Every 24 hours 02/05/23 0808 02/07/23 0804   02/05/23 0200  cefTRIAXone (ROCEPHIN) 1 g in sodium chloride 0.9 % 100 mL IVPB        1 g 200 mL/hr over 30 Minutes Intravenous  Once 02/05/23 0153 02/05/23 0315   02/05/23 0200  azithromycin (ZITHROMAX) tablet 500 mg        500 mg Oral  Once 02/05/23 0153 02/05/23 0245       Subjective: Patient seen and examined at the bedside this mrng.  Left knee pain appears much better today.   tenderness and edema has almost resolved.  Complains of lack of  bowel movement today.  Denies any worsening shortness of breath or cough Objective: Vitals:   02/07/23 0305 02/07/23 0326 02/07/23 0511 02/07/23 0830  BP: 102/72  99/67   Pulse: (!) 106 (!) 103 87   Resp: 18  17   Temp: 98 F (36.7 C)  97.8 F (36.6 C)   TempSrc: Oral  Oral   SpO2: 92% 97% 100% 99%  Weight:      Height:        Intake/Output Summary (Last 24 hours) at 02/07/2023 1208 Last data filed at 02/07/2023 0944 Gross per 24 hour  Intake 368.75 ml  Output 550 ml  Net -181.25 ml   Filed Weights   02/05/23 0131  Weight: 68.8 kg    Examination:  General exam: Overall comfortable, not in distress HEENT: PERRL Respiratory system: Mild expiratory  wheezing bilaterally, left pneumonectomy scar, diminished sounds on the left side Cardiovascular system: S1 & S2 heard, RRR.  Gastrointestinal system: Abdomen is nondistended, soft and nontender. Central nervous system: Alert and oriented Extremities: Mild edema/tenderness of the left knee, no clubbing ,no cyanosis Skin: No rashes, no ulcers,no icterus     Data Reviewed: I have personally reviewed following labs and imaging studies  CBC: Recent Labs  Lab 02/04/23 1944 02/06/23 0513 02/07/23 0459  WBC 6.7 6.3 7.4  HGB 12.8* 11.7* 11.2*  HCT 38.5* 35.5* 33.6*  MCV 89.7 88.8 88.9  PLT 306 283 [id number]   Basic Metabolic Panel: Recent Labs  Lab 02/04/23 1944 02/06/23 0513 02/07/23 0459  NA 134* 134* 130*  K 4.0 4.2 4.9  CL 106 104 99  CO2 18* 21* 19*  GLUCOSE 96 116* 191*  BUN 20 24* 38*  CREATININE 1.34* 1.58* 2.37*  CALCIUM 9.0 8.6* 8.2*     Recent Results (from the past 240 hour(s))  Resp panel by RT-PCR (RSV, Flu A&B, Covid) Anterior Nasal Swab  Status: None   Collection Time: 02/04/23 11:51 PM   Specimen: Anterior Nasal Swab  Result Value Ref Range Status   SARS Coronavirus 2 by RT PCR NEGATIVE NEGATIVE Final    Comment: (NOTE) SARS-CoV-2 target nucleic acids are NOT DETECTED.  The SARS-CoV-2 RNA is generally detectable in upper respiratory specimens during the acute phase of infection. The lowest concentration of SARS-CoV-2 viral copies this assay can detect is 138 copies/mL. A negative result does not preclude SARS-Cov-2 infection and should not be used as the sole basis for treatment or other patient management decisions. A negative result may occur with  improper specimen collection/handling, submission of specimen other than nasopharyngeal swab, presence of viral mutation(s) within the areas targeted by this assay, and inadequate number of viral copies(<138 copies/mL). A negative result must be combined with clinical observations, patient history, and  epidemiological information. The expected result is Negative.  Fact Sheet for Patients:  EntrepreneurPulse.com.au  Fact Sheet for Healthcare Providers:  IncredibleEmployment.be  This test is no t yet approved or cleared by the [place] FDA and  has been authorized for detection and/or diagnosis of SARS-CoV-2 by FDA under an Emergency Use Authorization (EUA). This EUA will remain  in effect (meaning this test can be used) for the duration of the COVID-19 declaration under Section 564(b)(1) of the Act, 21 U.S.C.section 360bbb-3(b)(1), unless the authorization is terminated  or revoked sooner.       Influenza A by PCR NEGATIVE NEGATIVE Final   Influenza B by PCR NEGATIVE NEGATIVE Final    Comment: (NOTE) The Xpert Xpress SARS-CoV-2/FLU/RSV plus assay is intended as an aid in the diagnosis of influenza from Nasopharyngeal swab specimens and should not be used as a sole basis for treatment. Nasal washings and aspirates are unacceptable for Xpert Xpress SARS-CoV-2/FLU/RSV testing.  Fact Sheet for Patients: EntrepreneurPulse.com.au  Fact Sheet for Healthcare Providers: IncredibleEmployment.be  This test is not yet approved or cleared by the [place] FDA and has been authorized for detection and/or diagnosis of SARS-CoV-2 by FDA under an Emergency Use Authorization (EUA). This EUA will remain in effect (meaning this test can be used) for the duration of the COVID-19 declaration under Section 564(b)(1) of the Act, 21 U.S.C. section 360bbb-3(b)(1), unless the authorization is terminated or revoked.     Resp Syncytial Virus by PCR NEGATIVE NEGATIVE Final    Comment: (NOTE) Fact Sheet for Patients: EntrepreneurPulse.com.au  Fact Sheet for Healthcare Providers: IncredibleEmployment.be  This test is not yet approved or cleared by the [place] FDA and has been  authorized for detection and/or diagnosis of SARS-CoV-2 by FDA under an Emergency Use Authorization (EUA). This EUA will remain in effect (meaning this test can be used) for the duration of the COVID-19 declaration under Section 564(b)(1) of the Act, 21 U.S.C. section 360bbb-3(b)(1), unless the authorization is terminated or revoked.  Performed at [facility], [place] [address], [place], [place] [address]   Blood culture (routine x 2)     Status: None (Preliminary result)   Collection Time: 02/05/23  4:01 AM   Specimen: BLOOD  Result Value Ref Range Status   Specimen Description   Final    BLOOD BLOOD RIGHT HAND Performed at [place] [address], [place], [place] [address]    Special Requests   Final    BOTTLES DRAWN AEROBIC AND ANAEROBIC Blood Culture adequate volume Performed at [place] [address], [place], [place] [address]    Culture  Final    NO GROWTH 2 DAYS Performed at [facility], [place] [address] [place], [place] [address]    Report Status PENDING  Incomplete  Blood culture (routine x 2)     Status: None (Preliminary result)   Collection Time: 02/05/23  4:03 AM   Specimen: BLOOD  Result Value Ref Range Status   Specimen Description   Final    BLOOD BLOOD RIGHT FOREARM Performed at [facility] [address], [place], [place] [address]    Special Requests   Final    BOTTLES DRAWN AEROBIC AND ANAEROBIC Blood Culture adequate volume Performed at [facility] [address], [place], [place] [address]    Culture   Final    NO GROWTH 2 DAYS Performed at [facility] [address] [place],  [address]    Report Status PENDING  Incomplete     Radiology Studies: ECHOCARDIOGRAM COMPLETE  Result Date: 02/06/2023    ECHOCARDIOGRAM REPORT   Patient Name:   Ronald [name] Date of Exam: 02/06/2023 Medical Rec #:  [id number]          Height:       60.0 in Accession #:    [id number]        Weight:       151.7 lb Date of Birth:  February 06, 1956         BSA:          1.660 m Patient Age:    89 years          BP:           105/77 mmHg Patient Gender: M                 HR:           86 bpm. Exam Location:  Inpatient Procedure: 2D Echo, Color Doppler and Cardiac Doppler Indications:    CAD Native Vessel I25.10, Congestive Heart Failure I50.9  History:        Patient has prior history of Echocardiogram examinations. CAD                 and Previous Myocardial Infarction, COPD; Risk                 Factors:Hypertension.  Sonographer:    Phineas Douglas Referring Phys: [id number] [place]  1. Left ventricular ejection fraction, by estimation, is 45 to 50%. The left ventricle has mildly decreased function. The left ventricle demonstrates global hypokinesis. The left ventricular internal cavity size was mildly dilated. Left ventricular diastolic parameters are consistent with Grade II diastolic dysfunction (pseudonormalization). Elevated left ventricular end-diastolic pressure.  2. Right ventricular systolic function is moderately reduced. The right ventricular size is moderately enlarged.  3. Left atrial size was moderately dilated.  4. Right atrial size was moderately dilated.  5. The mitral valve is abnormal. Moderate mitral valve regurgitation. No evidence of mitral stenosis.  6. Tricuspid valve regurgitation is moderate.  7. The aortic valve is tricuspid. There is mild calcification of the aortic valve. There is mild thickening of the aortic valve. Aortic valve regurgitation is mild. Aortic valve sclerosis is present, with no evidence of aortic valve stenosis.  8. The inferior vena cava is normal in size with greater than 50% respiratory variability, suggesting right atrial pressure of 3 mmHg. FINDINGS  Left Ventricle: Left ventricular ejection fraction, by estimation, is 45 to 50%. The left ventricle has mildly decreased  function. The  left ventricle demonstrates global hypokinesis. The left ventricular internal cavity size was mildly dilated. There is  no left ventricular hypertrophy. Left ventricular diastolic parameters are consistent with Grade II diastolic dysfunction (pseudonormalization). Elevated left ventricular end-diastolic pressure. Right Ventricle: The right ventricular size is moderately enlarged. No increase in right ventricular wall thickness. Right ventricular systolic function is moderately reduced. Left Atrium: Left atrial size was moderately dilated. Right Atrium: Right atrial size was moderately dilated. Pericardium: There is no evidence of pericardial effusion. Mitral Valve: Restricted posterior leaflet motion and calcified sub chordal apparatus. The mitral valve is abnormal. There is mild thickening of the mitral valve leaflet(s). There is mild calcification of the mitral valve leaflet(s). Mild mitral annular calcification. Moderate mitral valve regurgitation. No evidence of mitral valve stenosis. Tricuspid Valve: The tricuspid valve is normal in structure. Tricuspid valve regurgitation is moderate . No evidence of tricuspid stenosis. Aortic Valve: The aortic valve is tricuspid. There is mild calcification of the aortic valve. There is mild thickening of the aortic valve. Aortic valve regurgitation is mild. Aortic regurgitation PHT measures 512 msec. Aortic valve sclerosis is present,  with no evidence of aortic valve stenosis. Pulmonic Valve: The pulmonic valve was normal in structure. Pulmonic valve regurgitation is not visualized. No evidence of pulmonic stenosis. Aorta: The aortic root is normal in size and structure. Venous: The inferior vena cava is normal in size with greater than 50% respiratory variability, suggesting right atrial pressure of 3 mmHg. IAS/Shunts: No atrial level shunt detected by color flow Doppler.  LEFT VENTRICLE PLAX 2D LVIDd:         4.50 cm      Diastology LVIDs:         3.70 cm      LV e'  medial:    3.97 cm/s LV PW:         1.00 cm      LV E/e' medial:  30.0 LV IVS:        1.00 cm      LV e' lateral:   3.73 cm/s LVOT diam:     2.10 cm      LV E/e' lateral: 31.9 LV SV:         48 LV SV Index:   29 LVOT Area:     3.46 cm  LV Volumes (MOD) LV vol d, MOD A2C: 137.0 ml LV vol d, MOD A4C: 93.8 ml LV vol s, MOD A2C: 70.8 ml LV vol s, MOD A4C: 51.4 ml LV SV MOD A2C:     66.2 ml LV SV MOD A4C:     93.8 ml LV SV MOD BP:      52.2 ml RIGHT VENTRICLE RV Basal diam:  5.40 cm RV S prime:     10.90 cm/s TAPSE (M-mode): 2.0 cm LEFT ATRIUM             Index        RIGHT ATRIUM           Index LA diam:        4.10 cm 2.47 cm/m   RA Area:     21.30 cm LA Vol (A2C):   69.2 ml 41.70 ml/m  RA Volume:   67.90 ml  40.91 ml/m LA Vol (A4C):   51.3 ml 30.91 ml/m LA Biplane Vol: 61.2 ml 36.88 ml/m  AORTIC VALVE LVOT Vmax:   77.20 cm/s LVOT Vmean:  51.800 cm/s LVOT VTI:    0.139 m AI PHT:  512 msec  AORTA Ao Root diam: 3.20 cm Ao Asc diam:  3.20 cm MITRAL VALVE                  TRICUSPID VALVE MV Area (PHT): 4.83 cm       TR Peak grad:   49.8 mmHg MV Decel Time: 157 msec       TR Vmax:        353.00 cm/s MR Peak grad:    88.0 mmHg MR Mean grad:    56.0 mmHg    SHUNTS MR Vmax:         469.00 cm/s  Systemic VTI:  0.14 m MR Vmean:        348.0 cm/s   Systemic Diam: 2.10 cm MR PISA:         2.26 cm MR PISA Eff ROA: 15 mm MR PISA Radius:  0.60 cm MV E velocity: 119.00 cm/s MV A velocity: 92.20 cm/s MV E/A ratio:  1.29 Jenkins Rouge MD Electronically signed by Jenkins Rouge MD Signature Date/Time: 02/06/2023/4:52:00 PM    Final    DG Knee 1-2 Views Left  Result Date: 02/06/2023 CLINICAL DATA:  [id number] Swelling [id number] EXAM: LEFT KNEE - 1-2 VIEW COMPARISON:  None Available. FINDINGS: No acute fracture or dislocation. Mild degenerative changes of the medial compartment with joint space narrowing. No area of erosion or osseous destruction. No unexpected radiopaque foreign body. Vascular calcifications. No significant joint  effusion. IMPRESSION: 1. No acute fracture or dislocation. 2. Mild degenerative changes of the medial compartment. Electronically Signed   By: Valentino Saxon M.D.   On: 02/06/2023 11:58    Scheduled Meds:  acetaminophen  1,000 mg Oral Once   aspirin EC  81 mg Oral Daily   azithromycin  500 mg Oral QHS   bisacodyl  10 mg Rectal Once   enoxaparin (LOVENOX) injection  30 mg Subcutaneous Q24H   gabapentin  300 mg Oral TID   levothyroxine  75 mcg Oral Daily   lidocaine  1 patch Transdermal Q24H   mometasone-formoterol  2 puff Inhalation BID   montelukast  10 mg Oral QHS   polyethylene glycol  17 g Oral Daily   predniSONE  50 mg Oral Q breakfast   rosuvastatin  5 mg Oral Daily   senna-docusate  1 tablet Oral BID   Continuous Infusions:  cefTRIAXone (ROCEPHIN)  IV 2 g (02/07/23 0136)     LOS: 2 days   Shelly Coss, MD [facility] P3/25/2024, 12:08 PM

## 2023-02-07 NOTE — Progress Notes (Signed)
PHARMACIST - PHYSICIAN COMMUNICATION DR:   Tawanna Solo CONCERNING: Antibiotic IV to Oral Route Change Policy  RECOMMENDATION: This patient is receiving azithromycin by the intravenous route.  Based on criteria approved by the Pharmacy and Therapeutics Committee, the antibiotic(s) is/are being converted to the equivalent oral dose form(s).   DESCRIPTION: These criteria include: Patient being treated for a respiratory tract infection, urinary tract infection, cellulitis or clostridium difficile associated diarrhea if on metronidazole The patient is not neutropenic and does not exhibit a GI malabsorption state The patient is eating (either orally or via tube) and/or has been taking other orally administered medications for a least 24 hours The patient is improving clinically and has a Tmax < 100.5  If you have questions about this conversion, please contact the Pharmacy Department  []   [phone] )  [facility] []   [phone] )  [facility] []   [phone] )  [facility] []   [phone] )  [facility] [x]   [phone] )  [place], PharmD, [facility]: [phone]

## 2023-02-08 ENCOUNTER — Encounter: Payer: Self-pay | Admitting: Vascular Surgery

## 2023-02-08 ENCOUNTER — Inpatient Hospital Stay (HOSPITAL_COMMUNITY): Payer: 59

## 2023-02-08 DIAGNOSIS — I340 Nonrheumatic mitral (valve) insufficiency: Secondary | ICD-10-CM

## 2023-02-08 DIAGNOSIS — I5022 Chronic systolic (congestive) heart failure: Secondary | ICD-10-CM

## 2023-02-08 DIAGNOSIS — I5043 Acute on chronic combined systolic (congestive) and diastolic (congestive) heart failure: Secondary | ICD-10-CM

## 2023-02-08 DIAGNOSIS — Z515 Encounter for palliative care: Secondary | ICD-10-CM

## 2023-02-08 DIAGNOSIS — J189 Pneumonia, unspecified organism: Secondary | ICD-10-CM | POA: Diagnosis not present

## 2023-02-08 DIAGNOSIS — I051 Rheumatic mitral insufficiency: Secondary | ICD-10-CM

## 2023-02-08 DIAGNOSIS — R0602 Shortness of breath: Secondary | ICD-10-CM | POA: Diagnosis not present

## 2023-02-08 DIAGNOSIS — J441 Chronic obstructive pulmonary disease with (acute) exacerbation: Secondary | ICD-10-CM

## 2023-02-08 DIAGNOSIS — Z79899 Other long term (current) drug therapy: Secondary | ICD-10-CM

## 2023-02-08 DIAGNOSIS — G893 Neoplasm related pain (acute) (chronic): Secondary | ICD-10-CM

## 2023-02-08 DIAGNOSIS — Z7189 Other specified counseling: Secondary | ICD-10-CM

## 2023-02-08 LAB — BASIC METABOLIC PANEL
Anion gap: 9 (ref 5–15)
BUN: 41 mg/dL — ABNORMAL HIGH (ref 8–23)
CO2: 18 mmol/L — ABNORMAL LOW (ref 22–32)
Calcium: 8.3 mg/dL — ABNORMAL LOW (ref 8.9–10.3)
Chloride: 105 mmol/L (ref 98–111)
Creatinine, Ser: 1.44 mg/dL — ABNORMAL HIGH (ref 0.61–1.24)
GFR, Estimated: 54 mL/min — ABNORMAL LOW (ref >60)
Glucose, Bld: 143 mg/dL — ABNORMAL HIGH (ref 70–99)
Potassium: 5 mmol/L (ref 3.5–5.1)
Sodium: 132 mmol/L — ABNORMAL LOW (ref 135–145)

## 2023-02-08 LAB — CBC
HCT: 32.2 % — ABNORMAL LOW (ref 39.0–52.0)
Hemoglobin: 10.8 g/dL — ABNORMAL LOW (ref 13.0–17.0)
MCH: 29.8 pg (ref 26.0–34.0)
MCHC: 33.5 g/dL (ref 30.0–36.0)
MCV: 88.7 fL (ref 80.0–100.0)
Platelets: 277 10*3/uL (ref 150–400)
RBC: 3.63 MIL/uL — ABNORMAL LOW (ref 4.22–5.81)
RDW: 17.3 % — ABNORMAL HIGH (ref 11.5–15.5)
WBC: 9.8 10*3/uL (ref 4.0–10.5)
nRBC: 0 % (ref 0.0–0.2)

## 2023-02-08 LAB — LEGIONELLA PNEUMOPHILA SEROGP 1 UR AG: L. pneumophila Serogp 1 Ur Ag: NEGATIVE

## 2023-02-08 LAB — SODIUM, URINE, RANDOM: Sodium, Ur: 20 mmol/L

## 2023-02-08 MED ORDER — SENNOSIDES-DOCUSATE SODIUM 8.6-50 MG PO TABS
2.0000 | ORAL_TABLET | Freq: Two times a day (BID) | ORAL | Status: DC
  Administered 2023-02-08 – 2023-02-10 (×3): 2 via ORAL
  Filled 2023-02-08 (×3): qty 2

## 2023-02-08 MED ORDER — ACETAMINOPHEN 500 MG PO TABS
1000.0000 mg | ORAL_TABLET | Freq: Three times a day (TID) | ORAL | Status: DC
  Administered 2023-02-08 – 2023-02-10 (×6): 1000 mg via ORAL
  Filled 2023-02-08 (×6): qty 2

## 2023-02-08 MED ORDER — OXYCODONE HCL 5 MG PO TABS
10.0000 mg | ORAL_TABLET | Freq: Four times a day (QID) | ORAL | Status: DC | PRN
  Administered 2023-02-08: 10 mg via ORAL
  Filled 2023-02-08: qty 2

## 2023-02-08 MED ORDER — ENOXAPARIN SODIUM 40 MG/0.4ML IJ SOSY
40.0000 mg | PREFILLED_SYRINGE | INTRAMUSCULAR | Status: DC
  Administered 2023-02-08 – 2023-02-09 (×2): 40 mg via SUBCUTANEOUS
  Filled 2023-02-08 (×2): qty 0.4

## 2023-02-08 MED ORDER — SALINE SPRAY 0.65 % NA SOLN
1.0000 | NASAL | Status: DC | PRN
  Filled 2023-02-08: qty 44

## 2023-02-08 NOTE — Consult Note (Addendum)
Cardiology Consultation   Patient ID: RANNON ECONOMOS MRN: [id number]; DOB: 03-Apr-1956  Admit date: 02/04/2023 Date of Consult: 02/08/2023  PCP:  System, Provider Not In   [place] Providers Cardiologist:  Janina Mayo, MD     Patient Profile:   Ronald [name] is a 67 y.o. male with a hx of ischemic systolic heart failure, coronary artery disease (PCI x3, chronically occluded Lcx), severe MR, hypertension, thyroid disease, PAH, drug abuse, CKD, history of stage IIa non-small cell lung cancer in 2004 s/p left pneumonectomy. Also has a history of stage IV non-small cell lung cancer diagnosed 01/2022 s/p 4 cycles of carboplatin, paclitaxel and keytruda. He is being seen 02/08/2023 for the evaluation of CHF at the request of Dr. Tawanna Solo.  History of Present Illness:   Mr. Ronald [name] is a 67 year old male with above medical history who recently establish care with Dr. Harl Bowie.  Patient was previously followed by Dr. Terrence Dupont.  Has also seen [facility] and [facility].  He has had stents placed to the proximal LAD, proximal RI, mid left circumflex stent in the past.  Patient is on chronic O2.  Reported that after having left pneumonectomy and chemotherapy, he has chronic shortness of breath.  Echocardiogram in 01/2017 showed EF of 45-50% no wall motion abnormalities, grade 2 diastolic dysfunction, mild aortic regurgitation.  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 Later, in 12/2017, patient was admitted for evaluation of chest pain.  Echocardiogram on 12/30/17 showed EF 50-55%, mild LVH, mild aortic valve regurgitation, moderate mitral valve regurgitation.  Underwent left heart catheterization on 01/03/18 that showed 20% ostial left main stenosis, 50% mid LAD in-stent restenosis, 30-40% proximal ramus stenosis, occluded left circumflex, 20-30% mid RCA stenosis.  Plan was for medical management.  Patient established care with [facility] structural heart program in  08/2022 for treatment of mitral valve insufficiency.  He underwent right/left heart catheterization on 09/20/2022 that showed significant two-vessel CAD.  There was 20-30% stenosis in proximal LAD and 30-40% stenosis in mid LAD.  CTO of the proximal left circumflex.  RI had proximal 50-60% stenosis and distal 30-40% stenosis.  There was a patent stent in the proximal LAD and proximal RI with mild in-stent restenosis.  There was an occluded mid left circumflex stent.  Also noted severe mitral valve regurgitation, mildly reduced EF at 45%, severe precapillary pulmonary hypertension.  Recommended that patient be evaluated for possible MitraClip and be referred to pulmonology for follow-up of pulmonary hypertension.  He underwent TEE on 11/05/2022 that showed EF 25-30%.  Per [facility]'s note, "We looked at Mr. Angotti TEE in our valve conference.Unfortunately for him, we agree that this is not a "clippable" valve. 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 Harl Bowie on 01/27/23 after he been seen in the ED.  Patient had been seen in the ED in 12/2022 for evaluation of tremors and was found to have elevated troponin with no delta.  He was instructed to follow-up with his cardiologist.  Dr. Harl Bowie instructed patient to continue spironolactone, ramipril, hydrochlorothiazide.  His BP was somewhat soft so he was not treated with beta-blocker and Entresto.  Patient was seen by cardiothoracic surgery at [facility] on 01/31/2023.  At that time, patient complained of chest pain, exertional chest pressure/discomfort, dyspnea, fatigue, irregular heartbeat.  Patient was found to not be a candidate  for heart surgery.   On interview, patient reports that he has been having issues with chest pain for several years.  Reports feeling substernal pressure that is worse with exertion.  He occasionally will have this  pressure while at rest.  Always goes away with sublingual nitroglycerin.  Reports feeling that his heart is "tired".  He has been having this substernal chest pressure for several years and has had multiple heart catheterizations over that time  He believes that his symptoms have become more intense over time.  Reports having about 2 episodes of chest pressure per week.  Also has been having worsening shortness of breath for some time now.  Reports that he saw [place] cardiology for evaluation of his mitral valve and was told that he was not a surgical candidate.  He is hopeful that he will be a candidate for MitraClip.  He was not a candidate for MitraClip per [facility] cardiology.  Patient reports that he feels very "tired".  Is saddened that he is not able to do things that he enjoys because of his health.  Past Medical History:  Diagnosis Date   CHF (congestive heart failure) (HCC)    COPD (chronic obstructive pulmonary disease) ([place])    Coronary artery disease    Hypertension    lung ca dx'd 2005   chemo/xrt comp 2005, lung ca   Myocardial infarction [facility])    Shortness of breath     Past Surgical History:  Procedure Laterality Date   CARDIAC CATHETERIZATION N/A 10/13/2015   Procedure: Left Heart Cath and Coronary Angiography;  Surgeon: [name], MD;  Location: [place] CV LAB;  Service: Cardiovascular;  Laterality: N/A;   CORONARY ANGIOPLASTY WITH STENT PLACEMENT     LEFT HEART CATH AND CORONARY ANGIOGRAPHY N/A 01/03/2018   Procedure: LEFT HEART CATH AND CORONARY ANGIOGRAPHY;  Surgeon: [name], MD;  Location: [place] CV LAB;  Service: Cardiovascular;  Laterality: N/A;   LEFT HEART CATHETERIZATION WITH CORONARY ANGIOGRAM N/A 04/07/2012   Procedure: LEFT HEART CATHETERIZATION WITH CORONARY ANGIOGRAM;  Surgeon: [name], MD;  Location: [place] CATH LAB;  Service: Cardiovascular;  Laterality: N/A;   PNEUMONECTOMY  2005   left   vocal cord surgery  2005     Home Medications:   Prior to Admission medications   Medication Sig Start Date End Date Taking? Authorizing Provider  albuterol (PROVENTIL HFA;VENTOLIN HFA) 108 (90 BASE) MCG/ACT inhaler Inhale 2 puffs into the lungs every 4 (four) hours as needed for wheezing or shortness of breath. 10/14/13  Yes Ward, Delice Bison, DO  aspirin EC 81 MG tablet Take 81 mg by mouth daily. Swallow whole.   Yes [provider]  budesonide-formoterol (SYMBICORT) 160-4.5 MCG/ACT inhaler Inhale 2 puffs into the lungs daily.   Yes [provider]  furosemide (LASIX) 40 MG tablet Take 1 tablet (40 mg total) by mouth 2 (two) times daily. Twice a day for 3 days, then back to normal dosing. Patient taking differently: Take 40 mg by mouth 2 (two) times daily. 05/02/15  Yes Withrow, Elyse Jarvis, FNP  gabapentin (NEURONTIN) 300 MG capsule Take 300 mg by mouth 3 (three) times daily.   Yes [provider]  hydrochlorothiazide (HYDRODIURIL) 25 MG tablet Take 25 mg by mouth daily. 06/29/22  Yes [provider]  lidocaine-prilocaine (EMLA) cream Apply 1 Application topically as needed (To Port-a-Cath). 12/10/22  Yes Curt Bears, MD  LORazepam (ATIVAN) 1 MG tablet Take 1 tablet (1 mg total) by mouth 2 (  two) times daily as needed for anxiety. 12/27/22  Yes Wynona Dove A, DO  montelukast (SINGULAIR) 10 MG tablet Take 1 tablet (10 mg total) by mouth at bedtime. 05/02/15  Yes Withrow, Elyse Jarvis, FNP  ramipril (ALTACE) 2.5 MG capsule Take 2.5 mg by mouth daily. 10/05/22  Yes [provider]  rosuvastatin (CRESTOR) 5 MG tablet Take 5 mg by mouth daily. 12/03/22  Yes [provider]  spironolactone (ALDACTONE) 25 MG tablet Take 1 tablet (25 mg total) by mouth daily. 07/06/15  Yes TatShanon Brow, MD  TRELEGY ELLIPTA 200-62.5-25 MCG/ACT AEPB Take 1 puff by mouth daily. 12/02/22  Yes [provider]  zolpidem (AMBIEN) 10 MG tablet Take 10 mg by mouth at bedtime. 01/21/23  Yes [provider]  levothyroxine  (SYNTHROID) 50 MCG tablet Take 1 tablet (50 mcg total) by mouth daily before breakfast. Patient not taking: Reported on 01/27/2023 12/16/22   Curt Bears, MD  levothyroxine (SYNTHROID) 75 MCG tablet Take 75 mcg by mouth daily. Patient not taking: Reported on 02/05/2023 01/19/23   [provider]  nitroGLYCERIN (NITROSTAT) 0.4 MG SL tablet Place 1 tablet (0.4 mg total) under the tongue every 5 (five) minutes x 3 doses as needed for chest pain. Patient not taking: Reported on 02/05/2023 11/01/13   [name], MD    Inpatient Medications: Scheduled Meds:  acetaminophen  1,000 mg Oral Once   aspirin EC  81 mg Oral Daily   azithromycin  500 mg Oral QHS   enoxaparin (LOVENOX) injection  40 mg Subcutaneous Q24H   gabapentin  300 mg Oral TID   levothyroxine  75 mcg Oral Daily   lidocaine  1 patch Transdermal Q24H   midodrine  5 mg Oral TID WC   mometasone-formoterol  2 puff Inhalation BID   montelukast  10 mg Oral QHS   polyethylene glycol  17 g Oral Daily   predniSONE  50 mg Oral Q breakfast   rosuvastatin  5 mg Oral Daily   senna-docusate  1 tablet Oral BID   Continuous Infusions:  cefTRIAXone (ROCEPHIN)  IV 2 g (02/08/23 0140)   PRN Meds: acetaminophen **OR** acetaminophen, HYDROmorphone (DILAUDID) injection, ipratropium-albuterol, LORazepam, nitroGLYCERIN, ondansetron **OR** ondansetron (ZOFRAN) IV, oxyCODONE, sodium chloride  Allergies:    Allergies  Allergen Reactions   Penicillins Itching    Social History:   Social History   Socioeconomic History   Marital status: Married    Spouse name: Not on file   Number of children: Not on file   Years of education: Not on file   Highest education level: Not on file  Occupational History   Not on file  Tobacco Use   Smoking status: Former    Types: Cigarettes    Quit date: 09/30/2013    Years since quitting: 9.3   Smokeless tobacco: Never  Substance and Sexual Activity   Alcohol use: No    Comment: former    Drug use: No    Types: Cocaine   Sexual activity: Not Currently  Other Topics Concern   Not on file  Social History Narrative   Not on file   Social Determinants of Health   Financial Resource Strain: Not on file  Food Insecurity: Food Insecurity Present (02/05/2023)   Hunger Vital Sign    Worried About Running Out of Food in the Last Year: Sometimes true    Ran Out of Food in the Last Year: Sometimes true  Transportation Needs: No Transportation Needs (02/05/2023)   PRAPARE -  Hydrologist (Medical): No    Lack of Transportation (Non-Medical): No  Physical Activity: Not on file  Stress: Not on file  Social Connections: Not on file  Intimate Partner Violence: Not At Risk (02/05/2023)   Humiliation, Afraid, Rape, and Kick questionnaire    Fear of Current or Ex-Partner: No    Emotionally Abused: No    Physically Abused: No    Sexually Abused: No    Family History:    Family History  Problem Relation Age of Onset   Hypertension Other    Diabetes Other      ROS:  Please see the history of present illness.   All other ROS reviewed and negative.     Physical Exam/Data:   Vitals:   02/07/23 1408 02/07/23 1940 02/07/23 2014 02/08/23 0832  BP: 106/60 107/71    Pulse: 82 79    Resp: 18 17    Temp: 97.6 F (36.4 C) 98.2 F (36.8 C)    TempSrc: Oral Oral    SpO2: 96% 98% 94% 96%  Weight:      Height:        Intake/Output Summary (Last 24 hours) at 02/08/2023 1010 Last data filed at 02/08/2023 0300 Gross per 24 hour  Intake 1338.28 ml  Output 5 ml  Net 1333.28 ml      02/05/2023    1:31 AM 01/27/2023   10:24 AM 01/25/2023   11:13 AM  Last 3 Weights  Weight (lbs) 151 lb 10.8 oz 151 lb 12.8 oz 148 lb 8 oz  Weight (kg) 68.8 kg 68.856 kg 67.359 kg     Body mass index is 29.62 kg/m.  General:  Thin middle aged male. Sitting upright on the side of the bed. Wearing [place]  HEENT: normal Neck: no JVD Vascular: Radial pulses 2+  bilaterally Cardiac:  normal S1, S2; RRR; grade 2/6 systolic murmur at apex  Lungs:  Right lung clear to auscultation. Left lung fields with diminished breath sounds.  Abd: soft, nontender Ext: no edema in BLE  Musculoskeletal:  No deformities Skin: warm and dry  Neuro:  CNs 2-12 intact, no focal abnormalities noted Psych:  Normal affect   EKG:  The EKG was personally reviewed and demonstrates:  Sinus rhythm with PVC present, heart rate 92 bpm, nonspecific IVCD Telemetry:  Telemetry was personally reviewed and demonstrates: Sinus rhythm with infrequent PVCs  Relevant CV Studies:  Echocardiogram 02/06/2023 1. Left ventricular ejection fraction, by estimation, is 45 to 50%. The  left ventricle has mildly decreased function. The left ventricle  demonstrates global hypokinesis. The left ventricular internal cavity size  was mildly dilated. Left ventricular  diastolic parameters are consistent with Grade II diastolic dysfunction  (pseudonormalization). Elevated left ventricular end-diastolic pressure.   2. Right ventricular systolic function is moderately reduced. The right  ventricular size is moderately enlarged.   3. Left atrial size was moderately dilated.   4. Right atrial size was moderately dilated.   5. The mitral valve is abnormal. Moderate mitral valve regurgitation. No  evidence of mitral stenosis.   6. Tricuspid valve regurgitation is moderate.   7. The aortic valve is tricuspid. There is mild calcification of the  aortic valve. There is mild thickening of the aortic valve. Aortic valve  regurgitation is mild. Aortic valve sclerosis is present, with no evidence  of aortic valve stenosis.   8. The inferior vena cava is normal in size with greater than 50%  respiratory variability, suggesting  right atrial pressure of 3 mmHg.   Laboratory Data:  High Sensitivity Troponin:   Recent Labs  Lab 02/04/23 1944 02/04/23 2158  TROPONINIHS 56* 59*     Chemistry Recent Labs   Lab 02/06/23 0513 02/07/23 0459 02/08/23 0536  NA 134* 130* 132*  K 4.2 4.9 5.0  CL 104 99 105  CO2 21* 19* 18*  GLUCOSE 116* 191* 143*  BUN 24* 38* 41*  CREATININE 1.58* 2.37* 1.44*  CALCIUM 8.6* 8.2* 8.3*  GFRNONAA 48* 30* 54*  ANIONGAP 9 12 9     Recent Labs  Lab 02/06/23 0513  PROT 6.9  ALBUMIN 3.3*  AST 24  ALT 22  ALKPHOS 66  BILITOT 0.6   Lipids No results for input(s): "CHOL", "TRIG", "HDL", "LABVLDL", "LDLCALC", "CHOLHDL" in the last 168 hours.  Hematology Recent Labs  Lab 02/06/23 0513 02/07/23 0459 02/08/23 0536  WBC 6.3 7.4 9.8  RBC 4.00* 3.78* 3.63*  HGB 11.7* 11.2* 10.8*  HCT 35.5* 33.6* 32.2*  MCV 88.8 88.9 88.7  MCH 29.3 29.6 29.8  MCHC 33.0 33.3 33.5  RDW 17.4* 17.3* 17.3*  PLT 283 266 277   Thyroid No results for input(s): "TSH", "FREET4" in the last 168 hours.  BNP Recent Labs  Lab 02/04/23 1944  BNP 754.7*    DDimer No results for input(s): "DDIMER" in the last 168 hours.   Radiology/Studies:  DG CHEST PORT 1 VIEW  Result Date: 02/08/2023 CLINICAL DATA:  Short of breath EXAM: PORTABLE CHEST 1 VIEW COMPARISON:  02/04/2023 FINDINGS: Single frontal view of the chest demonstrates stable postsurgical changes from left pneumonectomy. Stable left chest wall port, tip overlying superior vena cava. Increased interstitial prominence throughout the right lung is unchanged, with stable patchy consolidation at the right lung base and fiduciary markers again noted. No right effusion or pneumothorax. Stable postsurgical changes from left thoracotomy. IMPRESSION: 1. Chronic interstitial scarring throughout the right lung. No superimposed pneumonia. 2. Continued patchy right basilar consolidation and underlying fiduciary markers, compatible with patient's known history of spiculated right middle lobe nodule seen on recent CT. Electronically Signed   By: Randa Ngo M.D.   On: 02/08/2023 10:06   US RENAL  Result Date: 02/07/2023 CLINICAL DATA:  Renal  dysfunction EXAM: RENAL / URINARY TRACT ULTRASOUND COMPLETE COMPARISON:  Abdomen sonogram done on 03/06/2015, CT done on 01/27/2023 FINDINGS: Right Kidney: Renal measurements: 10.1 x 5.5 x 5.5 cm = volume: 147.4 mL. There is no hydronephrosis. There is no focal cortical thinning. There is increased cortical echogenicity. Left Kidney: Renal measurements: 9.6 x 4.7 x 4.6 cm = volume: 108.1 mL. There is increased cortical echogenicity. There is no hydronephrosis or perinephric fluid collection. Bladder: Appears normal for degree of bladder distention. Other: None. IMPRESSION: There is no hydronephrosis. There is increased cortical echogenicity in both kidneys which may be a technical artifact or suggestive medical renal disease. Electronically Signed   By: Elmer Picker M.D.   On: 02/07/2023 15:08   ECHOCARDIOGRAM COMPLETE  Result Date: 02/06/2023    ECHOCARDIOGRAM REPORT   Patient Name:   TRAVELL LUDEKE Date of Exam: 02/06/2023 Medical Rec #:  [id number]         Height:       60.0 in Accession #:    [id number]        Weight:       151.7 lb Date of Birth:  [birth year]         BSA:  1.660 m Patient Age:    31 years          BP:           105/77 mmHg Patient Gender: M                 HR:           86 bpm. Exam Location:  Inpatient Procedure: 2D Echo, Color Doppler and Cardiac Doppler Indications:    CAD Native Vessel I25.10, Congestive Heart Failure I50.9  History:        Patient has prior history of Echocardiogram examinations. CAD                 and Previous Myocardial Infarction, COPD; Risk                 Factors:Hypertension.  Sonographer:    Phineas Douglas Referring Phys: [id number] [place]  1. Left ventricular ejection fraction, by estimation, is 45 to 50%. The left ventricle has mildly decreased function. The left ventricle demonstrates global hypokinesis. The left ventricular internal cavity size was mildly dilated. Left ventricular diastolic parameters are consistent with  Grade II diastolic dysfunction (pseudonormalization). Elevated left ventricular end-diastolic pressure.  2. Right ventricular systolic function is moderately reduced. The right ventricular size is moderately enlarged.  3. Left atrial size was moderately dilated.  4. Right atrial size was moderately dilated.  5. The mitral valve is abnormal. Moderate mitral valve regurgitation. No evidence of mitral stenosis.  6. Tricuspid valve regurgitation is moderate.  7. The aortic valve is tricuspid. There is mild calcification of the aortic valve. There is mild thickening of the aortic valve. Aortic valve regurgitation is mild. Aortic valve sclerosis is present, with no evidence of aortic valve stenosis.  8. The inferior vena cava is normal in size with greater than 50% respiratory variability, suggesting right atrial pressure of 3 mmHg. FINDINGS  Left Ventricle: Left ventricular ejection fraction, by estimation, is 45 to 50%. The left ventricle has mildly decreased function. The left ventricle demonstrates global hypokinesis. The left ventricular internal cavity size was mildly dilated. There is  no left ventricular hypertrophy. Left ventricular diastolic parameters are consistent with Grade II diastolic dysfunction (pseudonormalization). Elevated left ventricular end-diastolic pressure. Right Ventricle: The right ventricular size is moderately enlarged. No increase in right ventricular wall thickness. Right ventricular systolic function is moderately reduced. Left Atrium: Left atrial size was moderately dilated. Right Atrium: Right atrial size was moderately dilated. Pericardium: There is no evidence of pericardial effusion. Mitral Valve: Restricted posterior leaflet motion and calcified sub chordal apparatus. The mitral valve is abnormal. There is mild thickening of the mitral valve leaflet(s). There is mild calcification of the mitral valve leaflet(s). Mild mitral annular calcification. Moderate mitral valve regurgitation.  No evidence of mitral valve stenosis. Tricuspid Valve: The tricuspid valve is normal in structure. Tricuspid valve regurgitation is moderate . No evidence of tricuspid stenosis. Aortic Valve: The aortic valve is tricuspid. There is mild calcification of the aortic valve. There is mild thickening of the aortic valve. Aortic valve regurgitation is mild. Aortic regurgitation PHT measures 512 msec. Aortic valve sclerosis is present,  with no evidence of aortic valve stenosis. Pulmonic Valve: The pulmonic valve was normal in structure. Pulmonic valve regurgitation is not visualized. No evidence of pulmonic stenosis. Aorta: The aortic root is normal in size and structure. Venous: The inferior vena cava is normal in size with greater than 50% respiratory variability, suggesting right atrial pressure of  3 mmHg. IAS/Shunts: No atrial level shunt detected by color flow Doppler.  LEFT VENTRICLE PLAX 2D LVIDd:         4.50 cm      Diastology LVIDs:         3.70 cm      LV e' medial:    3.97 cm/s LV PW:         1.00 cm      LV E/e' medial:  30.0 LV IVS:        1.00 cm      LV e' lateral:   3.73 cm/s LVOT diam:     2.10 cm      LV E/e' lateral: 31.9 LV SV:         48 LV SV Index:   29 LVOT Area:     3.46 cm  LV Volumes (MOD) LV vol d, MOD A2C: 137.0 ml LV vol d, MOD A4C: 93.8 ml LV vol s, MOD A2C: 70.8 ml LV vol s, MOD A4C: 51.4 ml LV SV MOD A2C:     66.2 ml LV SV MOD A4C:     93.8 ml LV SV MOD BP:      52.2 ml RIGHT VENTRICLE RV Basal diam:  5.40 cm RV S prime:     10.90 cm/s TAPSE (M-mode): 2.0 cm LEFT ATRIUM             Index        RIGHT ATRIUM           Index LA diam:        4.10 cm 2.47 cm/m   RA Area:     21.30 cm LA Vol (A2C):   69.2 ml 41.70 ml/m  RA Volume:   67.90 ml  40.91 ml/m LA Vol (A4C):   51.3 ml 30.91 ml/m LA Biplane Vol: 61.2 ml 36.88 ml/m  AORTIC VALVE LVOT Vmax:   77.20 cm/s LVOT Vmean:  51.800 cm/s LVOT VTI:    0.139 m AI PHT:      512 msec  AORTA Ao Root diam: 3.20 cm Ao Asc diam:  3.20 cm MITRAL  VALVE                  TRICUSPID VALVE MV Area (PHT): 4.83 cm       TR Peak grad:   49.8 mmHg MV Decel Time: 157 msec       TR Vmax:        353.00 cm/s MR Peak grad:    88.0 mmHg MR Mean grad:    56.0 mmHg    SHUNTS MR Vmax:         469.00 cm/s  Systemic VTI:  0.14 m MR Vmean:        348.0 cm/s   Systemic Diam: 2.10 cm MR PISA:         2.26 cm MR PISA Eff ROA: 15 mm MR PISA Radius:  0.60 cm MV E velocity: 119.00 cm/s MV A velocity: 92.20 cm/s MV E/A ratio:  1.29 Jenkins Rouge MD Electronically signed by Jenkins Rouge MD Signature Date/Time: 02/06/2023/4:52:00 PM    Final    DG Knee 1-2 Views Left  Result Date: 02/06/2023 CLINICAL DATA:  [id number] [place] [id number] EXAM: LEFT KNEE - 1-2 VIEW COMPARISON:  None Available. FINDINGS: No acute fracture or dislocation. Mild degenerative changes of the medial compartment with joint space narrowing. No area of erosion or osseous destruction. No unexpected radiopaque foreign body. Vascular calcifications. No significant joint  effusion. IMPRESSION: 1. No acute fracture or dislocation. 2. Mild degenerative changes of the medial compartment. Electronically Signed   By: Valentino Saxon M.D.   On: 02/06/2023 11:58   CT Angio Chest PE W and/or Wo Contrast  Result Date: 02/04/2023 CLINICAL DATA:  Chest pain shortness of breath EXAM: CT ANGIOGRAPHY CHEST WITH CONTRAST TECHNIQUE: Multidetector CT imaging of the chest was performed using the standard protocol during bolus administration of intravenous contrast. Multiplanar CT image reconstructions and MIPs were obtained to evaluate the vascular anatomy. RADIATION DOSE REDUCTION: This exam was performed according to the departmental dose-optimization program which includes automated exposure control, adjustment of the mA and/or kV according to patient size and/or use of iterative reconstruction technique. CONTRAST:  72mL OMNIPAQUE IOHEXOL 350 MG/ML SOLN COMPARISON:  Chest x-ray 02/04/2023, CT chest 01/27/2023, 12/16/2022 FINDINGS:  Cardiovascular: Satisfactory opacification of the pulmonary arteries to the segmental level. No evidence of pulmonary embolism on the right. Cardiomegaly. Moderate aortic atherosclerosis without aneurysm. Coronary stent and calcifications. No significant pericardial effusion. Mediastinum/Nodes: Patent trachea. No suspicious thyroid nodule. Shift of mediastinal contents to the left due to history of left pneumonectomy. Esophagus within normal limits. Enlarged right hilar nodes measuring up to 14 mm not significantly changed. Esophagus within normal limits. Lungs/Pleura: Left pneumonectomy with chronic thick-walled fluid in the left thoracic cavity. Emphysema. Diffuse reticular densities throughout the right lung. Spiculated right middle lobe mass measuring 1.6 by 2 cm, previously 2 x 1.8 cm with adjacent fiducial markers. Increased hazy pulmonary density in the right lower lobe compared to prior. Upper Abdomen: No acute finding. Reflux of contrast into the hepatic veins consistent with elevated right heart pressure. Musculoskeletal: Postsurgical changes of the left ribs. No acute or suspicious osseous abnormality. Review of the MIP images confirms the above findings. IMPRESSION: 1. Status post left pneumonectomy. No evidence for acute right pulmonary embolus. 2. Cardiomegaly 3. Emphysema with diffuse interstitial densities throughout the right thorax, similar to previous exams. Slight increased hazy pulmonary density in the right lower lobe could be due to superimposed infectious or inflammatory process. 4. Grossly stable 2 cm right middle lobe spiculated nodule with adjacent fiducial markers Aortic Atherosclerosis (ICD10-I70.0) and Emphysema (ICD10-J43.9). Electronically Signed   By: Donavan Foil M.D.   On: 02/04/2023 23:30   DG Chest Portable 1 View  Result Date: 02/04/2023 CLINICAL DATA:  Chest pain, shortness of breath EXAM: PORTABLE CHEST 1 VIEW COMPARISON:  Previous studies including the chest radiograph  done on 12/27/2022 and CT done on 01/27/2023 FINDINGS: There is previous left pneumonectomy. There are few metallic clips in right lower lung field, possibly fiducial markers from previous biopsy increased interstitial markings are seen in right lower lung field. There is interval improvement in the aeration in right upper lung field since 01/27/2023. There is no focal consolidation. Right lateral CP angle is clear. There is no pneumothorax. Tip of left IJ central venous catheter is seen in superior vena cava. IMPRESSION: Increased interstitial markings in right lower lung field may suggest scarring or interstitial pneumonia. There is no focal consolidation. There is no right pleural effusion or pneumothorax. Previous left pneumonectomy. Electronically Signed   By: Elmer Picker M.D.   On: 02/04/2023 20:05     Assessment and Plan:   Chronic combined systolic and diastolic heart failure Ischemic cardiomyopathy RV dysfunction  Pulmonary hypertension - Patient has known history of ischemic cardiomyopathy, EF was 45-50% in 2018.  Did improve to 50-55% in 2019 - Patient had a right/left heart catheterization in  09/2022 through [facility] that showed EF reduced to 45%.  There was severe precapillary pulmonary hypertension and severe mitral valve regurgitation  - Patient presented to the ED on 3/22 complaining of chest pain.  BNP was elevated to 754. CTA chest showed emphysema with diffuse interstitial densities throughout the right thorax, similar to previous exams.  Also showed slight increased hazy pulmonary density in the right lower lobe that could be due to superimposed infectious or inflammatory process. - Echocardiogram 3/24 showed EF 45-50%, global hypokinesis, grade 2 diastolic dysfunction, moderately reduced RV systolic function, moderate mitral valve regurgitation. - On admission, patient remained on his home heart failure medications including p.o. Lasix 40 mg daily, ramipril  2.5 mg daily, spironolactone 25 mg daily.  He became hypotensive early in the a.m. on 3/24 and these medications were discontinued. -Patient is on midodrine, unfortunately continues to have low blood pressures -Recommend resuming home Lasix when able --patient is euvolemic on exam today  CAD Elevated troponins - Cardiac catheterization from 09/2022 showed significant two-vessel CAD with CTO of the proximal left circumflex.  There is also 20-30% stenosis in proximal LAD, 30-40% stenosis in mid LAD, 50-60% stenosis in proximal RI, 30-40% stenosis in distal RI.  Previously placed stents to the proximal LAD and proximal RI were patent with mild in-stent restenosis.  Previously placed mid left circumflex stent was occluded.   - Patient did present complaining of chest discomfort.  High-sensitivity troponin 56, 59 - Troponin trend not consistent with ACS.  Suspect trop elevation is due to demand ischemia in the setting of pneumonia, COPD exacerbation - Patient does report having chest pressure on exertion, occasionally at rest.  Symptoms are always relieved by sublingual nitroglycerin.  Reports that he has been having these symptoms for years, and has had heart catheterizations for evaluation.  His creatinine was elevated to 2.37 yesterday, did improve to 1.44 today.  As his symptoms are stable and as his renal function is unstable, okay to defer consideration of ischemic evaluation to outpatient setting - Continue ASA, crestor  - Not on beta-blocker given hypotension  Severe mitral valve regurgitation Rheumatic mitral valve disease - Patient has known history of severe mitral valve regurgitation.  Was seen by the structural heart team at [facility], and was found not to be a candidate for MitraClip (per their note, MVA is too small, but more importantly, it is a rheumatic valve with commisural fusion and his gradient would be unacceptably high after clip) - He was seen by cardiothoracic surgery at [facility] earlier  this month and was not a candidate for surgical repair.  He is waiting their decision on if he is a MitraClip candidate - TEE in 10/2022 through [facility] showed mild-moderate aortic valve regurgitation, severe broad mitral valve regurgitation - Echocardiogram this admission read as having moderate mitral valve regurgitation - As patient has severe MR without options for repair, suspect he will need to be on diuretics long-term. Plan to resume lasix when able as above.  Additionally, may be beneficial to have palliative care consult.   Otherwise per primary - Community-acquired pneumonia -Chronic hypoxic respiratory failure-on 2 L oxygen at baseline - COPD exacerbation - History of left lung cancer s/p left pneumonectomy.  Currently on chemotherapy with Carboplatin, paclitaxel, Keytruda - GERD - Hypothyroidism - AKI on CKD stage IIIa - Constipation   Risk Assessment/Risk Scores:      New York Heart Association (NYHA) Functional Class NYHA Class IV   For questions or updates, please  contact [place] Please consult [email or web address] for contact info under    Signed, Margie Billet, PA-C  02/08/2023 10:10 AM   Attending Note:   The patient was seen and examined.  Agree with assessment and plan as noted above.  Changes made to the above note as needed.  Patient seen and independently examined with Vikki Ports, PA .   We discussed all aspects of the encounter. I agree with the assessment and plan as stated above.     Mitral regurgitation:  pt has severe MR and has been evaluated by Dr. Cheree Ditto at [facility] and by Dr. Peterson Lombard at [facility] .  He has been turned down by both [facility] system and [facility] for open MV repair / replacement and Mitraclip.   I do not think there is much that we have to offer him at [facility] given his extensive evaluations at [place] .      Pt has stage IV non small cell lung cancer in the past  He  is s/p left pneumonectomy and has recurrent RML and RUL nodules as well as splenic nodules suspicious of metastasis   2.  Acute on chronic combined CHF:   Echo from 3/24 /24 reveals L:VEF of 45-50%.  Grade II DD  Moderate MR   3.  Acute renal insufficiency   4.  Hyperlipidemia:   cont rosuvastatin      I have spent a total of 40 minutes with patient reviewing hospital  notes , telemetry, EKGs, labs and examining patient as well as establishing an assessment and plan that was discussed with the patient.  > 50% of time was spent in direct patient care.    Thayer Headings, Brooke Bonito., MD, [facility] 02/08/2023, 1:54 PM [address]  [place] Pager [phone]

## 2023-02-08 NOTE — Progress Notes (Signed)
Physical Therapy Treatment Patient Details Name: Ronald [name] MRN: [id number] DOB: [birth year] Today's Date: 02/08/2023   History of Present Illness 67 year old male with history of  COPD (on 1-2L home O2), coronary artery disease, lung cancer status post left pneumonectomy/chemo/radiation who presented with complaint of pleuritic chest pain, fatigue, chills, dyspnea, wheezing, productive cough but no fever. Dx of PNA. Pt also c/o L knee pain, xray negative for fracture, uric acids levels noted to be elevated.    PT Comments     Pt admitted with above diagnosis.  Pt currently with functional limitations due to the deficits listed below (see PT Problem List).  Pt reported SOB overnight and desaturating in to the 80s. Pt agreeable to therapy to walk with rollator. PT adjusted rollator to appropriate height. PT provided ed on proper brake management and pt will benefit from continued reinforcement for safety and technique. Pt required S for transfers and min guard for gait tasks at rollator level for up to 68 feet with c/o L knee pain 7/10. Pt on 3 L/min t/o intervention and 94-95% with cues for coordinated breathing. Pt indicated he is going home tomorrow and anticipates [facility] services. Pt left seated EOB and room set up per pt preference and all needs met.  Pt will benefit from acute skilled PT to increase their independence and safety with mobility to allow discharge.     Recommendations for follow up therapy are one component of a multi-disciplinary discharge planning process, led by the attending physician.  Recommendations may be updated based on patient status, additional functional criteria and insurance authorization.  Follow Up Recommendations       Assistance Recommended at Discharge Intermittent Supervision/Assistance  Patient can return home with the following A little help with bathing/dressing/bathroom;Assist for transportation;Help with stairs or ramp for entrance   Equipment  Recommendations  Rollator (4 wheels) (in personal room and adjusted for pt)    Recommendations for Other Services       Precautions / Restrictions Precautions Precautions: Fall Precaution Comments: monitor O2 Restrictions Weight Bearing Restrictions: No     Mobility  Bed Mobility Overal bed mobility: Modified Independent Bed Mobility: Supine to Sit     Supine to sit: Modified independent (Device/Increase time)     General bed mobility comments: with HOB raised, used rail    Transfers Overall transfer level: Needs assistance Equipment used: None, Rollator (4 wheels) Transfers: Sit to/from Stand Sit to Stand: Min guard           General transfer comment: cues for proper rollator and brake management    Ambulation/Gait Ambulation/Gait assistance: Min guard Gait Distance (Feet): 68 Feet   Gait Pattern/deviations: Decreased stride length, Antalgic Gait velocity: decreased     General Gait Details: 3 L/min 94% -95% with exertion   Stairs             Wheelchair Mobility    Modified Rankin (Stroke Patients Only)       Balance Overall balance assessment: Needs assistance Sitting-balance support: Feet supported Sitting balance-Leahy Scale: Good     Standing balance support: Single extremity supported Standing balance-Leahy Scale: Fair                              Cognition Arousal/Alertness: Awake/alert Behavior During Therapy: WFL for tasks assessed/performed Overall Cognitive Status: Within Functional Limits for tasks assessed  Exercises      General Comments        Pertinent Vitals/Pain Pain Assessment Pain Assessment: 0-10 Pain Score: 7  Pain Location: L knee with walking Pain Descriptors / Indicators: Sharp, Discomfort, Constant Pain Intervention(s): Limited activity within patient's tolerance, Monitored during session    Home Living                           Prior Function            PT Goals (current goals can now be found in the care plan section) Acute Rehab PT Goals Patient Stated Goal: to breathe better PT Goal Formulation: With patient/family Time For Goal Achievement: 02/21/23 Potential to Achieve Goals: Good    Frequency    Min 3X/week      PT Plan Current plan remains appropriate    Co-evaluation              AM-PAC PT "6 Clicks" Mobility   Outcome Measure  Help needed turning from your back to your side while in a flat bed without using bedrails?: None Help needed moving from lying on your back to sitting on the side of a flat bed without using bedrails?: None Help needed moving to and from a bed to a chair (including a wheelchair)?: A Little Help needed standing up from a chair using your arms (e.g., wheelchair or bedside chair)?: A Little Help needed to walk in hospital room?: A Little Help needed climbing 3-5 steps with a railing? : A Little 6 Click Score: 20    End of Session Equipment Utilized During Treatment: Gait belt;Oxygen Activity Tolerance: Patient limited by fatigue;Patient limited by pain Patient left: in bed;with call bell/phone within reach Nurse Communication: Mobility status;Other (comment) (pt left seated EOB and set up per pt preferance for transfer to [facility]) PT Visit Diagnosis: Difficulty in walking, not elsewhere classified (R26.2);Pain Pain - Right/Left: Left Pain - part of body: Knee     Time: [id number] PT Time Calculation (min) (ACUTE ONLY): 26 min  Charges:  $Gait Training: 8-22 mins $Therapeutic Activity: 8-22 mins                     Baird Lyons, PT    Adair Patter 02/08/2023, 11:17 AM

## 2023-02-08 NOTE — Progress Notes (Addendum)
PROGRESS NOTE  [name]  [id number] DOB: Aug 31, 1956 DOA: 02/04/2023 PCP: System, Provider Not In   Brief Narrative: Patient is a 67 year old male with history of  COPD on 2 L of oxygen at home, coronary artery disease, lung cancer status post left pneumonectomy/chemo/radiation , mitral regurgitation who presented with complaint of pleuritic chest pain, fatigue, chills, dyspnea, wheezing, productive cough but no fever.  Initial vital signs on presentation were stable.  COVID/flu/RSV PCR negative.  Lab work showed elevated BNP of 755, creatinine of 1.3.  Chest x-ray showed interstitial marking in the right lower lung field suspicious for pneumonia.  CT angiogram showed emphysematous changes, possible infiltrate in the right lower lobe, stable 2 cm right middle lobe spiculated nodule.  Patient was admitted for the management of community-acquired pneumonia, started on ceftriaxone and azithromycin.  Echo during this hospitalization shows decreased EF of 45-50%, cardiology consulted.  Assessment & Plan:  Principal Problem:   CAP (community acquired pneumonia) Active Problems:   CAD (coronary artery disease)   Mitral regurgitation   Chronic systolic CHF (congestive heart failure)   COPD exacerbation (HCC)   Essential hypertension   Gastroesophageal reflux disease without esophagitis   Hypothyroidism (acquired)   Mixed hyperlipidemia  Community-acquired pneumonia: Presented with fatigue, pleuritic chest pain, chills, wheezing, productive cough.Chest x-ray showed interstitial marking in the right lower lung field suspicious for pneumonia.  CT angiogram showed emphysematous changes, possible infiltrate in the right lower lobe, stable 2 cm right middle lobe spiculated nodule.  Patient was admitted for the management of community-acquired pneumonia, started on ceftriaxone and azithromycin.Follow-up blood cultures.NGTD.  Negative streptococcal pneumoniae, pending Legionella antigen in urine.   Lactic acid level normal.  Continue current antibiotics.  Follow-up chest x-ray today did not show worsening of pneumonia, chronic changes were seen  Chronic hypoxic respiratory failure: On 2 L of oxygen at baseline.  Currently on 2 L of oxygen per minute.   COPD exacerbation: Secondary to pneumonia.  Continue current medications.  Had mild bilateral expiratory wheezing.  Continue prednisone which was ordered for possible gouty arthritis of left knee.  Continue as needed bronchodilators.  Takes Trelegy Ellipta, albuterol at home  History of lung cancer: History of stage IV non-small cell lung cancer, right middle and right upper lobe pulmonary nodules.follows with Dr. Julien Nordmann . History of left pneumonectomy.  Currently on chemotherapy: Carboplatin, paclitaxel, Keytruda  Systolic congestive heart failure: Echo done on this admission showed EF of 45-50%.  Previous echo had shown EF of 50 to 55%.  Cardiology consulted today.  Takes Lasix, ramipril, spironolactone at home.  Currently these are on hold due to soft blood pressure.  He looks overall euvolemic today.  Left knee pain: Complain of severe pain on 3/24, left knee found to be tender and edematous. [place] Locks of the left knee did not show any fracture or dislocation, mild degenerative changes.  Elevated uric acid level.  Denies any history of gout.  Started on prednisone.  Knee keeps looking  much better today.  Coronary artery disease: No anginal symptoms.  On statin, ACE inhibitor at home.  Mild elevated troponin with flat trend, likely from supply demand ischemia, no concern for ACS.  Denies any chest pain.  History of mitral regurgitation: Follows with [facility]ology  Hypertension: Blood pressure soft, antihypertensives on hold.On midodrine  GERD: Continue PPI  Hypothyroidism: Continue Synthyroid  Hyperlipidemia: On Crestor  AKI on CKD stage IIIa: Baseline creatinine ranges from 1.3-1.6.  Creatinine bumped up to the range  of 2.3 ,  started on gentle IV fluids with improvement.  Renal ultrasound did not show any hydronephrosis but showed changes consistent with medical renal disease.  Constipation: Continue bowel regimen  Debility/deconditioning: Consulted  PT/OT. 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  Will consult palliative care for goals of care        DVT prophylaxis:enoxaparin (LOVENOX) injection 40 mg Start: 02/08/23 2200     Code Status: Full Code  Family Communication: Called Sister Deneise Lever for update on 3/25  Patient status:Inpatient  Patient is from :Home  Anticipated discharge RC:[id number]  Estimated DC date:1-2 days   Consultants: Cardiology  Procedures:None  Antimicrobials:  Anti-infectives (From admission, onward)    Start     Dose/Rate Route Frequency Ordered Stop   02/07/23 2200  azithromycin (ZITHROMAX) tablet 500 mg        500 mg Oral Daily at bedtime 02/07/23 0804 02/09/23 2159   02/07/23 0200  cefTRIAXone (ROCEPHIN) 2 g in sodium chloride 0.9 % 100 mL IVPB        2 g 200 mL/hr over 30 Minutes Intravenous Every 24 hours 02/06/23 0815 02/11/23 0159   02/06/23 0300  cefTRIAXone (ROCEPHIN) 1 g in sodium chloride 0.9 % 100 mL IVPB  Status:  Discontinued        1 g 200 mL/hr over 30 Minutes Intravenous Every 24 hours 02/05/23 0808 02/06/23 0815   02/06/23 0300  azithromycin (ZITHROMAX) 500 mg in sodium chloride 0.9 % 250 mL IVPB  Status:  Discontinued        500 mg 250 mL/hr over 60 Minutes Intravenous Every 24 hours 02/05/23 0808 02/07/23 0804   02/05/23 0200  cefTRIAXone (ROCEPHIN) 1 g in sodium chloride 0.9 % 100 mL IVPB        1 g 200 mL/hr over 30 Minutes Intravenous  Once 02/05/23 0153 02/05/23 0315   02/05/23 0200  azithromycin (ZITHROMAX) tablet 500 mg        500 mg Oral  Once 02/05/23 0153 02/05/23 0245       Subjective:  Patient seen and examined at bedside today.  He said he was  short of breath last night.  During my evaluation, he was on 2 L of oxygen which was his baseline.  Sitting in the chair.  Left knee pain better  Objective: Vitals:   02/07/23 1408 02/07/23 1940 02/07/23 2014 02/08/23 0832  BP: 106/60 107/71    Pulse: 82 79    Resp: 18 17    Temp: 97.6 F (36.4 C) 98.2 F (36.8 C)    TempSrc: Oral Oral    SpO2: 96% 98% 94% 96%  Weight:      Height:        Intake/Output Summary (Last 24 hours) at 02/08/2023 1304 Last data filed at 02/08/2023 1200 Gross per 24 hour  Intake 1588.28 ml  Output 405 ml  Net 1183.28 ml   Filed Weights   02/05/23 0131  Weight: 68.8 kg    Examination:   General exam: Overall comfortable, not in distress HEENT: PERRL Respiratory system: No obvious  rhonchi or crackles.  Mild bilateral expiratory wheezing Cardiovascular system: S1 & S2 heard, RRR.  Gastrointestinal system: Abdomen is nondistended, soft and nontender. Central nervous system: Alert and oriented Extremities: No edema, no clubbing ,no cyanosis Skin: No rashes, no ulcers,no icterus     Data Reviewed: I have personally reviewed following labs and  imaging studies  CBC: Recent Labs  Lab 02/04/23 1944 02/06/23 0513 02/07/23 0459 02/08/23 0536  WBC 6.7 6.3 7.4 9.8  HGB 12.8* 11.7* 11.2* 10.8*  HCT 38.5* 35.5* 33.6* 32.2*  MCV 89.7 88.8 88.9 88.7  PLT 306 283 266 [id number]   Basic Metabolic Panel: Recent Labs  Lab 02/04/23 1944 02/06/23 0513 02/07/23 0459 02/08/23 0536  NA 134* 134* 130* 132*  K 4.0 4.2 4.9 5.0  CL 106 104 99 105  CO2 18* 21* 19* 18*  GLUCOSE 96 116* 191* 143*  BUN 20 24* 38* 41*  CREATININE 1.34* 1.58* 2.37* 1.44*  CALCIUM 9.0 8.6* 8.2* 8.3*     Recent Results (from the past 240 hour(s))  Resp panel by RT-PCR (RSV, Flu A&B, Covid) Anterior Nasal Swab     Status: None   Collection Time: 02/04/23 11:51 PM   Specimen: Anterior Nasal Swab  Result Value Ref Range Status   SARS Coronavirus 2 by RT PCR NEGATIVE NEGATIVE  Final    Comment: (NOTE) SARS-CoV-2 target nucleic acids are NOT DETECTED.  The SARS-CoV-2 RNA is generally detectable in upper respiratory specimens during the acute phase of infection. The lowest concentration of SARS-CoV-2 viral copies this assay can detect is 138 copies/mL. A negative result does not preclude SARS-Cov-2 infection and should not be used as the sole basis for treatment or other patient management decisions. A negative result may occur with  improper specimen collection/handling, submission of specimen other than nasopharyngeal swab, presence of viral mutation(s) within the areas targeted by this assay, and inadequate number of viral copies(<138 copies/mL). A negative result must be combined with clinical observations, patient history, and epidemiological information. The expected result is Negative.  Fact Sheet for Patients:  EntrepreneurPulse.com.au  Fact Sheet for Healthcare Providers:  IncredibleEmployment.be  This test is no t yet approved or cleared by the [place] FDA and  has been authorized for detection and/or diagnosis of SARS-CoV-2 by FDA under an Emergency Use Authorization (EUA). This EUA will remain  in effect (meaning this test can be used) for the duration of the COVID-19 declaration under Section 564(b)(1) of the Act, 21 U.S.C.section 360bbb-3(b)(1), unless the authorization is terminated  or revoked sooner.       Influenza A by PCR NEGATIVE NEGATIVE Final   Influenza B by PCR NEGATIVE NEGATIVE Final    Comment: (NOTE) The Xpert Xpress SARS-CoV-2/FLU/RSV plus assay is intended as an aid in the diagnosis of influenza from Nasopharyngeal swab specimens and should not be used as a sole basis for treatment. Nasal washings and aspirates are unacceptable for Xpert Xpress SARS-CoV-2/FLU/RSV testing.  Fact Sheet for Patients: EntrepreneurPulse.com.au  Fact Sheet for Healthcare  Providers: IncredibleEmployment.be  This test is not yet approved or cleared by the [place] FDA and has been authorized for detection and/or diagnosis of SARS-CoV-2 by FDA under an Emergency Use Authorization (EUA). This EUA will remain in effect (meaning this test can be used) for the duration of the COVID-19 declaration under Section 564(b)(1) of the Act, 21 U.S.C. section 360bbb-3(b)(1), unless the authorization is terminated or revoked.     Resp Syncytial Virus by PCR NEGATIVE NEGATIVE Final    Comment: (NOTE) Fact Sheet for Patients: EntrepreneurPulse.com.au  Fact Sheet for Healthcare Providers: IncredibleEmployment.be  This test is not yet approved or cleared by the [place] FDA and has been authorized for detection and/or diagnosis of SARS-CoV-2 by FDA under an Emergency Use Authorization (EUA). This EUA will remain in effect (meaning this test  can be used) for the duration of the COVID-19 declaration under Section 564(b)(1) of the Act, 21 U.S.C. section 360bbb-3(b)(1), unless the authorization is terminated or revoked.  Performed at [facility], [place] [address] [place], [place] [address]   Blood culture (routine x 2)     Status: None (Preliminary result)   Collection Time: 02/05/23  4:01 AM   Specimen: BLOOD  Result Value Ref Range Status   Specimen Description   Final    BLOOD BLOOD RIGHT HAND Performed at [place] [address], [place], [place] [address]    Special Requests   Final    BOTTLES DRAWN AEROBIC AND ANAEROBIC Blood Culture adequate volume Performed at [facility] [address], [place], [place] [address]    Culture   Final    NO GROWTH 3 DAYS Performed at [facility], [place] [address], [place], [place] [address]    Report Status PENDING  Incomplete  Blood culture (routine x 2)     Status: None (Preliminary result)    Collection Time: 02/05/23  4:03 AM   Specimen: BLOOD  Result Value Ref Range Status   Specimen Description   Final    BLOOD BLOOD RIGHT FOREARM Performed at [place] [address], [place], [place] [address]    Special Requests   Final    BOTTLES DRAWN AEROBIC AND ANAEROBIC Blood Culture adequate volume Performed at [facility] [address], [place], [place] [address]    Culture   Final    NO GROWTH 3 DAYS Performed at [facility], [place] [address], [place], [place] [address]    Report Status PENDING  Incomplete     Radiology Studies: DG CHEST PORT 1 VIEW  Result Date: 02/08/2023 CLINICAL DATA:  Short of breath EXAM: PORTABLE CHEST 1 VIEW COMPARISON:  02/04/2023 FINDINGS: Single frontal view of the chest demonstrates stable postsurgical changes from left pneumonectomy. Stable left chest wall port, tip overlying superior vena cava. Increased interstitial prominence throughout the right lung is unchanged, with stable patchy consolidation at the right lung base and fiduciary markers again noted. No right effusion or pneumothorax. Stable postsurgical changes from left thoracotomy. IMPRESSION: 1. Chronic interstitial scarring throughout the right lung. No superimposed pneumonia. 2. Continued patchy right basilar consolidation and underlying fiduciary markers, compatible with patient's known history of spiculated right middle lobe nodule seen on recent CT. Electronically Signed   By: Randa Ngo M.D.   On: 02/08/2023 10:06   US RENAL  Result Date: 02/07/2023 CLINICAL DATA:  Renal dysfunction EXAM: RENAL / URINARY TRACT ULTRASOUND COMPLETE COMPARISON:  Abdomen sonogram done on 03/06/2015, CT done on 01/27/2023 FINDINGS: Right Kidney: Renal measurements: 10.1 x 5.5 x 5.5 cm = volume: 147.4 mL. There is no hydronephrosis. There is no focal cortical thinning. There is increased cortical echogenicity. Left Kidney: Renal measurements: 9.6 x 4.7  x 4.6 cm = volume: 108.1 mL. There is increased cortical echogenicity. There is no hydronephrosis or perinephric fluid collection. Bladder: Appears normal for degree of bladder distention. Other: None. IMPRESSION: There is no hydronephrosis. There is increased cortical echogenicity in both kidneys which may be a technical artifact or suggestive medical renal disease. Electronically Signed   By: Elmer Picker M.D.   On: 02/07/2023 15:08   ECHOCARDIOGRAM COMPLETE  Result Date: 02/06/2023    ECHOCARDIOGRAM REPORT   Patient Name:   Ronald [name] Date of Exam: 02/06/2023 Medical Rec #:  [id number]         Height:       60.0 in Accession #:    [id number]        Weight:       151.7 lb Date of Birth:  [birth year]         BSA:          1.660 m Patient Age:    65 years          BP:           105/77 mmHg Patient Gender: M                 HR:           86 bpm. Exam Location:  Inpatient Procedure: 2D Echo, Color Doppler and Cardiac Doppler Indications:    CAD Native Vessel I25.10, Congestive Heart Failure I50.9  History:        Patient has prior history of Echocardiogram examinations. CAD                 and Previous Myocardial Infarction, COPD; Risk                 Factors:Hypertension.  Sonographer:    Phineas Douglas Referring Phys: [id number] [place]  1. Left ventricular ejection fraction, by estimation, is 45 to 50%. The left ventricle has mildly decreased function. The left ventricle demonstrates global hypokinesis. The left ventricular internal cavity size was mildly dilated. Left ventricular diastolic parameters are consistent with Grade II diastolic dysfunction (pseudonormalization). Elevated left ventricular end-diastolic pressure.  2. Right ventricular systolic function is moderately reduced. The right ventricular size is moderately enlarged.  3. Left atrial size was moderately dilated.  4. Right atrial size was moderately dilated.  5. The mitral valve is abnormal. Moderate mitral valve  regurgitation. No evidence of mitral stenosis.  6. Tricuspid valve regurgitation is moderate.  7. The aortic valve is tricuspid. There is mild calcification of the aortic valve. There is mild thickening of the aortic valve. Aortic valve regurgitation is mild. Aortic valve sclerosis is present, with no evidence of aortic valve stenosis.  8. The inferior vena cava is normal in size with greater than 50% respiratory variability, suggesting right atrial pressure of 3 mmHg. FINDINGS  Left Ventricle: Left ventricular ejection fraction, by estimation, is 45 to 50%. The left ventricle has mildly decreased function. The left ventricle demonstrates global hypokinesis. The left ventricular internal cavity size was mildly dilated. There is  no left ventricular hypertrophy. Left ventricular diastolic parameters are consistent with Grade II diastolic dysfunction (pseudonormalization). Elevated left ventricular end-diastolic pressure. Right Ventricle: The right ventricular size is moderately enlarged. No increase in right ventricular wall thickness. Right ventricular systolic function is moderately reduced. Left Atrium: Left atrial size was moderately dilated. Right Atrium: Right atrial size was moderately dilated. Pericardium: There is no evidence of pericardial effusion. Mitral Valve: Restricted posterior leaflet motion and calcified sub chordal apparatus. The mitral valve is abnormal. There is mild thickening of the mitral valve leaflet(s). There is mild calcification of the mitral valve leaflet(s). Mild mitral annular calcification. Moderate mitral valve regurgitation. No evidence of mitral valve stenosis. Tricuspid Valve: The tricuspid valve is normal in structure. Tricuspid valve regurgitation is moderate . No evidence of tricuspid stenosis. Aortic Valve: The aortic valve is tricuspid. There is mild calcification of the aortic valve. There is mild thickening of the aortic valve. Aortic valve regurgitation is mild. Aortic  regurgitation PHT measures 512 msec.  Aortic valve sclerosis is present,  with no evidence of aortic valve stenosis. Pulmonic Valve: The pulmonic valve was normal in structure. Pulmonic valve regurgitation is not visualized. No evidence of pulmonic stenosis. Aorta: The aortic root is normal in size and structure. Venous: The inferior vena cava is normal in size with greater than 50% respiratory variability, suggesting right atrial pressure of 3 mmHg. IAS/Shunts: No atrial level shunt detected by color flow Doppler.  LEFT VENTRICLE PLAX 2D LVIDd:         4.50 cm      Diastology LVIDs:         3.70 cm      LV e' medial:    3.97 cm/s LV PW:         1.00 cm      LV E/e' medial:  30.0 LV IVS:        1.00 cm      LV e' lateral:   3.73 cm/s LVOT diam:     2.10 cm      LV E/e' lateral: 31.9 LV SV:         48 LV SV Index:   29 LVOT Area:     3.46 cm  LV Volumes (MOD) LV vol d, MOD A2C: 137.0 ml LV vol d, MOD A4C: 93.8 ml LV vol s, MOD A2C: 70.8 ml LV vol s, MOD A4C: 51.4 ml LV SV MOD A2C:     66.2 ml LV SV MOD A4C:     93.8 ml LV SV MOD BP:      52.2 ml RIGHT VENTRICLE RV Basal diam:  5.40 cm RV S prime:     10.90 cm/s TAPSE (M-mode): 2.0 cm LEFT ATRIUM             Index        RIGHT ATRIUM           Index LA diam:        4.10 cm 2.47 cm/m   RA Area:     21.30 cm LA Vol (A2C):   69.2 ml 41.70 ml/m  RA Volume:   67.90 ml  40.91 ml/m LA Vol (A4C):   51.3 ml 30.91 ml/m LA Biplane Vol: 61.2 ml 36.88 ml/m  AORTIC VALVE LVOT Vmax:   77.20 cm/s LVOT Vmean:  51.800 cm/s LVOT VTI:    0.139 m AI PHT:      512 msec  AORTA Ao Root diam: 3.20 cm Ao Asc diam:  3.20 cm MITRAL VALVE                  TRICUSPID VALVE MV Area (PHT): 4.83 cm       TR Peak grad:   49.8 mmHg MV Decel Time: 157 msec       TR Vmax:        353.00 cm/s MR Peak grad:    88.0 mmHg MR Mean grad:    56.0 mmHg    SHUNTS MR Vmax:         469.00 cm/s  Systemic VTI:  0.14 m MR Vmean:        348.0 cm/s   Systemic Diam: 2.10 cm MR PISA:         2.26 cm MR PISA Eff ROA:  15 mm MR PISA Radius:  0.60 cm MV E velocity: 119.00 cm/s MV A velocity: 92.20 cm/s MV E/A ratio:  1.29 Jenkins Rouge MD Electronically signed by Jenkins Rouge MD Signature Date/Time: 02/06/2023/4:52:00 PM  Final     Scheduled Meds:  acetaminophen  1,000 mg Oral Once   aspirin EC  81 mg Oral Daily   azithromycin  500 mg Oral QHS   enoxaparin (LOVENOX) injection  40 mg Subcutaneous Q24H   gabapentin  300 mg Oral TID   levothyroxine  75 mcg Oral Daily   lidocaine  1 patch Transdermal Q24H   midodrine  5 mg Oral TID WC   mometasone-formoterol  2 puff Inhalation BID   montelukast  10 mg Oral QHS   polyethylene glycol  17 g Oral Daily   predniSONE  50 mg Oral Q breakfast   rosuvastatin  5 mg Oral Daily   senna-docusate  1 tablet Oral BID   Continuous Infusions:  cefTRIAXone (ROCEPHIN)  IV 2 g (02/08/23 0140)     LOS: 3 days   Shelly Coss, MD [facility] P3/26/2024, 1:04 PM

## 2023-02-08 NOTE — TOC Progression Note (Signed)
Transition of Care [facility]) - Progression Note    Patient Details  Name: Ronald [name] MRN: [id number] Date of Birth: Sep 26, 1956  Transition of Care [facility]) CM/SW [place], LCSW Phone Number: 02/08/2023, 1:02 PM  Clinical Narrative:    Rollator ordered through [employer] and will be delivered to pts room. [facility]'s unable to provide [facility] services other than PT. HHPT has been arranged with [facility]. HH order will need to be placed prior to discharge.    Expected Discharge Plan: [place] Barriers to Discharge: No Barriers Identified  Expected Discharge Plan and Services In-house Referral: NA Discharge Planning Services: NA Post Acute Care Choice: Home Health, Durable Medical Equipment Living arrangements for the past 2 months: Apartment                           HH Arranged: PT [place]: [place] Date [place]: 02/08/23 Time [place]: 1302 Representative spoke with at [place]: [place] Determinants of Health ([place]) Interventions [place]: Food Insecurity Present (02/05/2023)  Housing: Low Risk  (02/05/2023)  Transportation Needs: No Transportation Needs (02/05/2023)  Utilities: Not At Risk (02/05/2023)  Tobacco Use: Medium Risk (02/04/2023)    Readmission Risk Interventions    02/07/2023    2:23 PM  Readmission Risk Prevention Plan  Transportation Screening Complete  Medication Review ([place]) Complete  PCP or Specialist appointment within 3-5 days of discharge Complete  HRI or [place] Complete  SW Recovery Care/Counseling Consult Complete  [place] Not Applicable

## 2023-02-08 NOTE — Consult Note (Signed)
Consultation Note Date: 02/08/2023   Patient Name: Ronald [name]  DOB: Jan 29, 1956  MRN: [id number]  Age / Sex: 67 y.o., male   PCP: System, Provider Not In Referring Physician: Shelly Coss, MD  Reason for Consultation: Establishing goals of care     Chief Complaint/History of Present Illness:   Patient is a 67 year old male with a past medical history of COPD, CAD, stage IV lung cancer status post left pneumonectomy/chemo/radiation, and mitral regurgitation who was admitted on 02/04/2023 for management of pleuritic chest pain, fatigue, dyspnea, and productive cough.  Imaging during hospitalization showed signs for community-acquired pneumonia.  Echo obtained during hospitalization showed decreased EF of 45-50%; cardiology following along.  Patient has known severe mitral regurgitation and had followed up with [facility] and [facility] for evaluation.  Both [facility] and [facility] deemed that patient was not a candidate for surgical repair.  Patient is currently awaiting word from [place] as to if patient will be a MitraClip repair candidate.  Palliative medicine team consulted to assist with complex medical decision making.  Extensive review of EMR prior to seeing patient.  Followed up regarding notes at most recent [facility] evaluation.  Patient followed by Dr. Earlie Server for stage IV non-small cell lung cancer.  Patient currently on Pastos for management. At time of EMR review, patient has been receiving gabapentin 300 mg 3 times daily, oxycodone 5 mg every 6 hours as needed x 1 dose, and prednisone daily.  Patient requiring midodrine 5 mg 3 times daily for hypotension management.  Presented to bedside to meet with patient.  Introduced myself and the role of the palliative medicine team in patient's care.  Patient easily able to discuss his recent evaluations at [facility].  Patient noted that he is awaiting a phone call from [place] to inform him if he will be at MitraClip candidate there.  Patient knows he is not a  surgical candidate.  Patient states that the heart doctors have "not been telling me what I want to hear".  Acknowledged this and empathized with difficult situation.  Provided supportive via active listening.  Spent time exploring what patient likes to do when he is feeling well.  Patient notes that he enjoys fishing and grilling.  Patient enjoys spending time with his grandchildren.  Patient notes he had 3 sons though 1 has passed away.  His son, Ronald [name], currently lives in [place] so he does not get to see him as much as he would like.  Patient currently lives with his sister and any Wildy.  Patient notes he wants to get his own place to make himself more comfortable.  Patient's primary concerns at this time are on his symptom management.  Patient describes pain in the right side of his chest.  Patient describes the pain as being "where his cancer is at".  Patient notes the pain can come and go and feel like a stabbing sensation.  Patient received oxycodone 5 mg earlier in the day and did not feel this provided any relief.  Patient does feel that Tylenol and ibuprofen can help with pain management though discussed need to monitor ibuprofen use with worsening kidney function in setting of stage IV heart failure. Patient's other concern was his constipation.  Patient noted he had a small bowel movement this morning though still feels constipated.  Patient is normally on MiraLAX at home.  Noted would continue this and adjust senna as needed to relieve constipation as did not want this worsening pain. Patient also noted that  he has not been able to sleep since he has been in the hospital.  Patient normally takes Ambien nightly.  Noted would defer to hospitalist and continuing this medication.  Discussed advance care planning with patient.  Patient states he is discussed with his son, Ronald [name], his wishes for care once he dies.  Patient notes he is discussed with his son medical interventions that he  would find acceptable to himself as well though he did not want to discuss these with me.  Patient claims he has completed ACP documentation though noted this is not in EMR.  Inquired who patient would want to make medical decisions for him if he was unable to make medical decisions for himself.  Patient notes that he would want his son, Ronald [name], to make medical decisions if he was unable to do so.  Patient provided contact information for Ronald [name]. Also discussed CODE STATUS with patient.  Explained in detail full code versus DNR, continue appropriate medical care.  Patient noted that he tried to be "DNR" when he first got to the hospital although he noted someone overrode this and discussed with him to "give them a chance".  So patient wants to continue with full CODE STATUS at this time.  Discussed worry that with patient's severe heart disease and stage IV cancer, if patient was sick enough that his heart were to stop or he were to physically stop breathing, interventions such as chest compressions and placing him on a ventilator would not allow him to get back to the quality of life and independence he finds important.  Encourage patient to further consider CODE STATUS and discuss this further with family.  All questions answered at that time.  Thanked patient for allowing me to visit with him today.  Updated care team after visit.  Primary Diagnoses  Present on Admission:  CAP (community acquired pneumonia)  Mixed hyperlipidemia  Mitral regurgitation  Hypothyroidism (acquired)  Essential hypertension  Gastroesophageal reflux disease without esophagitis  COPD exacerbation (HCC)  Chronic systolic CHF (congestive heart failure)  CAD (coronary artery disease)   Palliative Review of Systems: R sided chest pain, constipation  Past Medical History:  Diagnosis Date   CHF (congestive heart failure) (HCC)    COPD (chronic obstructive pulmonary disease) (HCC)    Coronary artery  disease    Hypertension    lung ca dx'd 2005   chemo/xrt comp 2005, lung ca   Myocardial infarction (HCC)    Shortness of breath    Social History   Socioeconomic History   Marital status: Married    Spouse name: Not on file   Number of children: Not on file   Years of education: Not on file   Highest education level: Not on file  Occupational History   Not on file  Tobacco Use   Smoking status: Former    Types: Cigarettes    Quit date: 09/30/2013    Years since quitting: 9.3   Smokeless tobacco: Never  Substance and Sexual Activity   Alcohol use: No    Comment: former   Drug use: No    Types: Cocaine   Sexual activity: Not Currently  Other Topics Concern   Not on file  Social History Narrative   Not on file   Social Determinants of Health   Financial Resource Strain: Not on file  Food Insecurity: Food Insecurity Present (02/05/2023)   Hunger Vital Sign    Worried About Running Out of Food in the Last  Year: Sometimes true    Ran Out of Food in the Last Year: Sometimes true  Transportation Needs: No Transportation Needs (02/05/2023)   PRAPARE - Hydrologist (Medical): No    Lack of Transportation (Non-Medical): No  Physical Activity: Not on file  Stress: Not on file  Social Connections: Not on file   Family History  Problem Relation Age of Onset   Hypertension Other    Diabetes Other    Scheduled Meds:  acetaminophen  1,000 mg Oral Once   aspirin EC  81 mg Oral Daily   azithromycin  500 mg Oral QHS   enoxaparin (LOVENOX) injection  40 mg Subcutaneous Q24H   gabapentin  300 mg Oral TID   levothyroxine  75 mcg Oral Daily   lidocaine  1 patch Transdermal Q24H   midodrine  5 mg Oral TID WC   mometasone-formoterol  2 puff Inhalation BID   montelukast  10 mg Oral QHS   polyethylene glycol  17 g Oral Daily   predniSONE  50 mg Oral Q breakfast   rosuvastatin  5 mg Oral Daily   senna-docusate  1 tablet Oral BID   Continuous  Infusions:  cefTRIAXone (ROCEPHIN)  IV 2 g (02/08/23 0140)   PRN Meds:.acetaminophen **OR** acetaminophen, HYDROmorphone (DILAUDID) injection, ipratropium-albuterol, LORazepam, nitroGLYCERIN, ondansetron **OR** ondansetron (ZOFRAN) IV, oxyCODONE, sodium chloride Allergies  Allergen Reactions   Penicillins Itching   CBC:    Component Value Date/Time   WBC 9.8 02/08/2023 0536   HGB 10.8 (L) 02/08/2023 0536   HGB 12.4 (L) 01/25/2023 1100   HGB 15.9 12/14/2012 0942   HCT 32.2 (L) 02/08/2023 0536   HCT 47.6 12/14/2012 0942   PLT 277 02/08/2023 0536   PLT 233 01/25/2023 1100   PLT 250 12/14/2012 0942   MCV 88.7 02/08/2023 0536   MCV 88.1 12/14/2012 0942   NEUTROABS 4.6 01/27/2023 0148   NEUTROABS 5.6 12/14/2012 0942   LYMPHSABS 1.1 01/27/2023 0148   LYMPHSABS 1.4 12/14/2012 0942   MONOABS 0.6 01/27/2023 0148   MONOABS 1.1 (H) 12/14/2012 0942   EOSABS 0.3 01/27/2023 0148   EOSABS 0.1 12/14/2012 0942   BASOSABS 0.1 01/27/2023 0148   BASOSABS 0.1 12/14/2012 0942   Comprehensive Metabolic Panel:    Component Value Date/Time   NA 132 (L) 02/08/2023 0536   NA 133 (L) 12/14/2012 0942   K 5.0 02/08/2023 0536   K 4.5 12/14/2012 0942   CL 105 02/08/2023 0536   CL 101 12/14/2012 0942   CO2 18 (L) 02/08/2023 0536   CO2 24 12/14/2012 0942   BUN 41 (H) 02/08/2023 0536   BUN 21.8 12/14/2012 0942   CREATININE 1.44 (H) 02/08/2023 0536   CREATININE 1.70 (H) 01/25/2023 1100   CREATININE 1.0 12/14/2012 0942   GLUCOSE 143 (H) 02/08/2023 0536   GLUCOSE 87 12/14/2012 0942   CALCIUM 8.3 (L) 02/08/2023 0536   CALCIUM 9.0 12/14/2012 0942   AST 24 02/06/2023 0513   AST 30 01/25/2023 1100   AST 31 12/14/2012 0942   ALT 22 02/06/2023 0513   ALT 19 01/25/2023 1100   ALT 42 12/14/2012 0942   ALKPHOS 66 02/06/2023 0513   ALKPHOS 108 12/14/2012 0942   BILITOT 0.6 02/06/2023 0513   BILITOT 0.4 01/25/2023 1100   BILITOT 0.63 12/14/2012 0942   PROT 6.9 02/06/2023 0513   PROT 7.3 12/14/2012  0942   ALBUMIN 3.3 (L) 02/06/2023 0513   ALBUMIN 3.2 (L) 12/14/2012 [id number]  Physical Exam: Vital Signs: BP 110/74 (BP Location: Left Arm)   Pulse 78   Temp (!) 97.5 F (36.4 C) (Oral)   Resp 19   Ht 5' (1.524 m)   Wt 68.8 kg   SpO2 97%   BMI 29.62 kg/m  SpO2: SpO2: 97 % O2 Device: O2 Device: Nasal Cannula O2 Flow Rate: O2 Flow Rate (L/min): 2 L/min Intake/output summary:  Intake/Output Summary (Last 24 hours) at 02/08/2023 1441 Last data filed at 02/08/2023 1200 Gross per 24 hour  Intake 1470.38 ml  Output 405 ml  Net 1065.38 ml   LBM: Last BM Date : 02/07/23 Baseline Weight: Weight: 68.8 kg Most recent weight: Weight: 68.8 kg  General: NAD, alert, laying in bed, grimacing at times  Eyes: No drainage noted HENT:  moist mucous membranes Cardiovascular: RRR Respiratory: no increased work of breathing noted, not in respiratory distress Abdomen: not distended Extremities: Moving all appropriately Skin: no rashes or lesions on visible skin Neuro: A&Ox4, following commands easily Psych: Appropriately sad          Palliative Performance Scale: 50%              Additional Data Reviewed: Recent Labs    02/07/23 0459 02/08/23 0536  WBC 7.4 9.8  HGB 11.2* 10.8*  PLT 266 277  NA 130* 132*  BUN 38* 41*  CREATININE 2.37* 1.44*    Imaging: DG CHEST PORT 1 VIEW CLINICAL DATA:  Short of breath  EXAM: PORTABLE CHEST 1 VIEW  COMPARISON:  02/04/2023  FINDINGS: Single frontal view of the chest demonstrates stable postsurgical changes from left pneumonectomy. Stable left chest wall port, tip overlying superior vena cava. Increased interstitial prominence throughout the right lung is unchanged, with stable patchy consolidation at the right lung base and fiduciary markers again noted. No right effusion or pneumothorax. Stable postsurgical changes from left thoracotomy.  IMPRESSION: 1. Chronic interstitial scarring throughout the right lung. No superimposed  pneumonia. 2. Continued patchy right basilar consolidation and underlying fiduciary markers, compatible with patient's known history of spiculated right middle lobe nodule seen on recent CT.  Electronically Signed   By: Randa Ngo M.D.   On: 02/08/2023 10:06    I personally reviewed recent imaging.   Palliative Care Assessment and Plan Summary of Established Goals of Care and Medical Treatment Preferences   Patient is a 67 year old male with a past medical history of COPD, CAD, stage IV lung cancer status post left pneumonectomy/chemo/radiation, and mitral regurgitation who was admitted on 02/04/2023 for management of pleuritic chest pain, fatigue, dyspnea, and productive cough.  Imaging during hospitalization showed signs for community-acquired pneumonia.  Echo obtained during hospitalization showed decreased EF of 45-50%; cardiology following along.  Patient has known severe mitral regurgitation and had followed up with [facility] and [facility] for evaluation.  Both [facility] and [facility] deemed that patient was not a candidate for surgical repair.  Patient is currently awaiting word from [place] as to if patient will be a MitraClip repair candidate.  Palliative medicine team consulted to assist with complex medical decision making.  # Complex medical decision making/goals of care  -Extensive conversation with patient as described in detail above in HPI.  Patient awaiting phone call from [place] to determine candidacy regarding MitraClip.  Patient has heard he is not a surgical candidate (from [facility] or [facility]).  Patient hopeful for more quality time moving forward with his grandchildren.  Discussed importance of prioritizing time knowing patient has serious underlying medical conditions.   -Did  not discussed with patient but based on his NYHA Class IV/ stage IV NSCLC diagnosis, patient is a candidate for hospice though currently patient's goals for medical care do not align with hospice philosophy.   -  Code Status: Full  Code    -Discussed in detail with patient full code versus DNR, continue appropriate medical care.  Patient wishes to remain full code at this time.  Encouraged consideration that with patient's underlying medical illnesses entheses stage IV lung cancer and stage IV CHF) should patient's heart stop or he stops breathing, interventions such as cardiac resuscitation and mechanical ventilation would not allow him to return to the quality of life he finds important.  Encouraged further consideration and discussion with family members regarding this.  # Symptom management  -Pain, acute on chronic in setting of metastatic NSCLC    -Start Tylenol 1000 mg every 8 hours during the day   -Increase oxycodone to 10 mg every 6 hours as needed.  Patient did not feel any improvement in pain with 5 mg dose.   -Continue lidocaine patch   -Patient noted improvement of pain with ibuprofen though would need to use sparingly based on patient's renal function in setting of CHF.   -On prednisone which can assist with pain management.   -Constipation   -Continue MiraLAX 17 g daily   -Increase senna to 2 tabs twice daily   Insomnia   Normally on Ambien nightly.  Defer to hospitalist regarding continued use of this medication.  # Psycho-social/Spiritual Support:  - Support System: sons, brother, sister  - Patient is spiritual. Could consider chaplain support.   # Discharge Planning:  Home with PT. Recommend home palliative medicine referral. TOC consult placed.   Thank you for allowing the palliative care team to participate in the care Burnetta Sabin.  Chelsea Aus, DO Palliative Care Provider PMT # [phone]  If patient remains symptomatic despite maximum doses, please call PMT at [phone] between 0700 and 1900. Outside of these hours, please call attending, as PMT does not have night coverage.  This provider spent a total of 82 minutes providing patient's care.  Includes review of EMR, discussing  care with other staff members involved in patient's medical care, obtaining relevant history and information from patient and/or patient's family, and personal review of imaging and lab work. Greater than 50% of the time was spent counseling and coordinating care related to the above assessment and plan.    *Please note that this is a verbal dictation therefore any spelling or grammatical errors are due to the "[facility]" system interpretation.

## 2023-02-09 ENCOUNTER — Telehealth: Payer: Self-pay | Admitting: Medical Oncology

## 2023-02-09 DIAGNOSIS — Z79899 Other long term (current) drug therapy: Secondary | ICD-10-CM | POA: Diagnosis not present

## 2023-02-09 DIAGNOSIS — I5043 Acute on chronic combined systolic (congestive) and diastolic (congestive) heart failure: Secondary | ICD-10-CM

## 2023-02-09 DIAGNOSIS — G893 Neoplasm related pain (acute) (chronic): Secondary | ICD-10-CM | POA: Diagnosis not present

## 2023-02-09 DIAGNOSIS — Z515 Encounter for palliative care: Secondary | ICD-10-CM | POA: Diagnosis not present

## 2023-02-09 DIAGNOSIS — R4589 Other symptoms and signs involving emotional state: Secondary | ICD-10-CM

## 2023-02-09 DIAGNOSIS — J189 Pneumonia, unspecified organism: Secondary | ICD-10-CM | POA: Diagnosis not present

## 2023-02-09 DIAGNOSIS — Z7189 Other specified counseling: Secondary | ICD-10-CM | POA: Diagnosis not present

## 2023-02-09 LAB — BASIC METABOLIC PANEL
Anion gap: 10 (ref 5–15)
BUN: 45 mg/dL — ABNORMAL HIGH (ref 8–23)
CO2: 18 mmol/L — ABNORMAL LOW (ref 22–32)
Calcium: 8.6 mg/dL — ABNORMAL LOW (ref 8.9–10.3)
Chloride: 105 mmol/L (ref 98–111)
Creatinine, Ser: 1.33 mg/dL — ABNORMAL HIGH (ref 0.61–1.24)
GFR, Estimated: 59 mL/min — ABNORMAL LOW (ref >60)
Glucose, Bld: 122 mg/dL — ABNORMAL HIGH (ref 70–99)
Potassium: 5 mmol/L (ref 3.5–5.1)
Sodium: 133 mmol/L — ABNORMAL LOW (ref 135–145)

## 2023-02-09 MED ORDER — BISACODYL 10 MG RE SUPP
10.0000 mg | Freq: Once | RECTAL | Status: DC
  Filled 2023-02-09: qty 1

## 2023-02-09 MED ORDER — PREDNISONE 20 MG PO TABS
30.0000 mg | ORAL_TABLET | Freq: Every day | ORAL | Status: DC
  Administered 2023-02-10: 30 mg via ORAL
  Filled 2023-02-09: qty 1

## 2023-02-09 MED ORDER — PREDNISONE 10 MG PO TABS: 10.0000 mg | ORAL_TABLET | Freq: Every day | ORAL | Status: DC

## 2023-02-09 MED ORDER — SODIUM BICARBONATE 650 MG PO TABS
650.0000 mg | ORAL_TABLET | Freq: Two times a day (BID) | ORAL | Status: DC
  Administered 2023-02-09 – 2023-02-10 (×2): 650 mg via ORAL
  Filled 2023-02-09 (×2): qty 1

## 2023-02-09 MED ORDER — OXYCODONE HCL 5 MG PO TABS
10.0000 mg | ORAL_TABLET | Freq: Four times a day (QID) | ORAL | Status: DC | PRN
  Administered 2023-02-09 – 2023-02-10 (×3): 10 mg via ORAL
  Filled 2023-02-09 (×3): qty 2

## 2023-02-09 MED ORDER — OXYCODONE HCL 5 MG PO TABS: 5.0000 mg | ORAL_TABLET | Freq: Four times a day (QID) | ORAL | Status: DC | PRN

## 2023-02-09 MED ORDER — SENNA 8.6 MG PO TABS: 2.0000 | ORAL_TABLET | Freq: Once | ORAL | Status: DC

## 2023-02-09 MED ORDER — PREDNISONE 20 MG PO TABS: 20.0000 mg | ORAL_TABLET | Freq: Every day | ORAL | Status: DC

## 2023-02-09 NOTE — Progress Notes (Addendum)
PROGRESS NOTE  Ronald [name]  [id number] DOB: [birth year] DOA: 02/04/2023 PCP: System, Provider Not In   Brief Narrative: Patient is a 66 year old male with history of  COPD on 2 L of oxygen at home, coronary artery disease, lung cancer status post left pneumonectomy/chemo/radiation , mitral regurgitation who presented with complaint of pleuritic chest pain, fatigue, chills, dyspnea, wheezing, productive cough but no fever.  Initial vital signs on presentation were stable.  COVID/flu/RSV PCR negative.  Lab work showed elevated BNP of 755, creatinine of 1.3.  Chest x-ray showed interstitial marking in the right lower lung field suspicious for pneumonia.  CT angiogram showed emphysematous changes, possible infiltrate in the right lower lobe, stable 2 cm right middle lobe spiculated nodule.  Patient was admitted for the management of community-acquired pneumonia, started on ceftriaxone and azithromycin.  Echo during this hospitalization shows decreased EF of 45-50%, cardiology consulted.  Due to patient's history of stage IV lung cancer and new finding of CHF, we requested palliative care evaluation.  Plan for discharge tomorrow to home if remains stable  Assessment & Plan:  Principal Problem:   CAP (community acquired pneumonia) Active Problems:   CAD (coronary artery disease)   Mitral regurgitation   Chronic systolic CHF (congestive heart failure)   COPD exacerbation (HCC)   Essential hypertension   Gastroesophageal reflux disease without esophagitis   Hypothyroidism (acquired)   Mixed hyperlipidemia   Palliative care encounter   SOB (shortness of breath)   Goals of care, counseling/discussion   Cancer associated pain   High risk medication use   Counseling and coordination of care   Need for emotional support  Community-acquired pneumonia: Presented with fatigue, pleuritic chest pain, chills, wheezing, productive cough.Chest x-ray showed interstitial marking in the right lower lung  field suspicious for pneumonia.  CT angiogram showed emphysematous changes, possible infiltrate in the right lower lobe, stable 2 cm right middle lobe spiculated nodule.  Patient was admitted for the management of community-acquired pneumonia, started on ceftriaxone and azithromycin.Follow-up blood cultures.NGTD.  Negative streptococcal pneumoniae, .  Lactic acid level normal.  Continue current antibiotics.  Follow-up chest x-ray on 3/26 did not show worsening of pneumonia, chronic changes were seen  Chronic hypoxic respiratory failure: On 2 L of oxygen at baseline.  Currently on 2 L of oxygen per minute.   COPD exacerbation: Secondary to pneumonia.  Continue current medications.  Has mild bilateral expiratory wheezing.  Continue prednisone which was ordered for possible gouty arthritis of left knee ,we will taper.  Continue as needed bronchodilators.  Takes Trelegy Ellipta, albuterol at home  History of lung cancer: History of stage IV non-small cell lung cancer, right middle and right upper lobe pulmonary nodules.follows with Dr. Julien Nordmann . History of left pneumonectomy.  Currently on chemotherapy: Carboplatin, paclitaxel, Keytruda  Systolic congestive heart failure: Echo done on this admission showed EF of 45-50%.  Previous echo had shown EF of 50 to 55%.  Cardiology consulted today.  Takes Lasix, ramipril, spironolactone at home.  Currently these are on hold due to soft blood pressure.  He looks overall euvolemic today.  Left knee pain: Complain of severe pain on 3/24, left knee found to be tender and edematous. [place] of the left knee did not show any fracture or dislocation, mild degenerative changes.  Elevated uric acid level.  Denies any history of gout.  Started on prednisone,now tapering.  Knee keeps looking  much better today.  Coronary artery disease: No anginal symptoms.  On statin, ACE  inhibitor at home.  Mild elevated troponin with flat trend, likely from supply demand ischemia, no  concern for ACS.  Denies any chest pain.  History of mitral regurgitation: Follows with [facility]ology  Hypertension: Blood pressure soft, antihypertensives on hold.On midodrine  GERD: Continue PPI  Hypothyroidism: Continue Synthyroid  Hyperlipidemia: On Crestor  AKI on CKD stage IIIa: Baseline creatinine ranges from 1.3-1.6.  Creatinine bumped up to the range of 2.3 , started on gentle IV fluids with improvement.  Renal ultrasound did not show any hydronephrosis but showed changes consistent with medical renal disease.Started on bicarb tablets  Constipation: Continue bowel regimen  Debility/deconditioning: Consulted  PT/OT. 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  Palliative care following.  Recommend outpatient follow-up at home with palliative care.  CODE STATUS remain full. I had a long discussion about his disease problems and prognosis  with his sister Deneise Lever today  who lives with him and is his caretaker       DVT prophylaxis:enoxaparin (LOVENOX) injection 40 mg Start: 02/08/23 2200     Code Status: Full Code  Family Communication: Called Sister Deneise Lever for update on 3/25  Patient status:Inpatient  Patient is from :Home  Anticipated discharge RC:[id number]  Estimated DC date: Tomorrow   Consultants: Cardiology, palliative care  Procedures:None  Antimicrobials:  Anti-infectives (From admission, onward)    Start     Dose/Rate Route Frequency Ordered Stop   02/07/23 2200  azithromycin (ZITHROMAX) tablet 500 mg        500 mg Oral Daily at bedtime 02/07/23 0804 02/08/23 2114   02/07/23 0200  cefTRIAXone (ROCEPHIN) 2 g in sodium chloride 0.9 % 100 mL IVPB        2 g 200 mL/hr over 30 Minutes Intravenous Every 24 hours 02/06/23 0815 02/11/23 0159   02/06/23 0300  cefTRIAXone (ROCEPHIN) 1 g in sodium chloride 0.9 % 100 mL IVPB  Status:  Discontinued        1 g 200 mL/hr over 30 Minutes  Intravenous Every 24 hours 02/05/23 0808 02/06/23 0815   02/06/23 0300  azithromycin (ZITHROMAX) 500 mg in sodium chloride 0.9 % 250 mL IVPB  Status:  Discontinued        500 mg 250 mL/hr over 60 Minutes Intravenous Every 24 hours 02/05/23 0808 02/07/23 0804   02/05/23 0200  cefTRIAXone (ROCEPHIN) 1 g in sodium chloride 0.9 % 100 mL IVPB        1 g 200 mL/hr over 30 Minutes Intravenous  Once 02/05/23 0153 02/05/23 0315   02/05/23 0200  azithromycin (ZITHROMAX) tablet 500 mg        500 mg Oral  Once 02/05/23 0153 02/05/23 0245       Subjective:  Patient seen and examined at bedside today.  He looks anxious.  He remains on room air.  Left knee pain better.  Very concerned about fluid overload but he looks overall euvolemic.  Has mild expiratory wheezing on auscultation.  We again discussed about what he thinks about going home today.  He thinks he is not ready today and wants to go home tomorrow.  Objective: Vitals:   02/08/23 1918 02/08/23 1950 02/08/23 2248 02/09/23 0730  BP:  102/71    Pulse:  77    Resp:  18    Temp:  97.6 F (36.4 C)    TempSrc:  Oral    SpO2: 92% 98% 93% 97%  Weight:      Height:        Intake/Output Summary (Last 24 hours) at 02/09/2023 1229 Last data filed at 02/09/2023 0745 Gross per 24 hour  Intake 340 ml  Output 380 ml  Net -40 ml   Filed Weights   02/05/23 0131  Weight: 68.8 kg    Examination:    General exam: Overall comfortable, not in distress,anxious looking HEENT: PERRL Respiratory system: Mild bilateral expiratory wheezing Cardiovascular system: S1 & S2 heard, RRR.  Gastrointestinal system: Abdomen is nondistended, soft and nontender. Central nervous system: Alert and oriented Extremities: No edema, no clubbing ,no cyanosis Skin: No rashes, no ulcers,no icterus      Data Reviewed: I have personally reviewed following labs and imaging studies  CBC: Recent Labs  Lab 02/04/23 1944 02/06/23 0513 02/07/23 0459 02/08/23 0536   WBC 6.7 6.3 7.4 9.8  HGB 12.8* 11.7* 11.2* 10.8*  HCT 38.5* 35.5* 33.6* 32.2*  MCV 89.7 88.8 88.9 88.7  PLT 306 283 266 [id number]   Basic Metabolic Panel: Recent Labs  Lab 02/04/23 1944 02/06/23 0513 02/07/23 0459 02/08/23 0536 02/09/23 0552  NA 134* 134* 130* 132* 133*  K 4.0 4.2 4.9 5.0 5.0  CL 106 104 99 105 105  CO2 18* 21* 19* 18* 18*  GLUCOSE 96 116* 191* 143* 122*  BUN 20 24* 38* 41* 45*  CREATININE 1.34* 1.58* 2.37* 1.44* 1.33*  CALCIUM 9.0 8.6* 8.2* 8.3* 8.6*     Recent Results (from the past 240 hour(s))  Resp panel by RT-PCR (RSV, Flu A&B, Covid) Anterior Nasal Swab     Status: None   Collection Time: 02/04/23 11:51 PM   Specimen: Anterior Nasal Swab  Result Value Ref Range Status   SARS Coronavirus 2 by RT PCR NEGATIVE NEGATIVE Final    Comment: (NOTE) SARS-CoV-2 target nucleic acids are NOT DETECTED.  The SARS-CoV-2 RNA is generally detectable in upper respiratory specimens during the acute phase of infection. The lowest concentration of SARS-CoV-2 viral copies this assay can detect is 138 copies/mL. A negative result does not preclude SARS-Cov-2 infection and should not be used as the sole basis for treatment or other patient management decisions. A negative result may occur with  improper specimen collection/handling, submission of specimen other than nasopharyngeal swab, presence of viral mutation(s) within the areas targeted by this assay, and inadequate number of viral copies(<138 copies/mL). A negative result must be combined with clinical observations, patient history, and epidemiological information. The expected result is Negative.  Fact Sheet for Patients:  EntrepreneurPulse.com.au  Fact Sheet for Healthcare Providers:  IncredibleEmployment.be  This test is no t yet approved or cleared by the [place] FDA and  has been authorized for detection and/or diagnosis of SARS-CoV-2 by FDA under an Emergency Use  Authorization (EUA). This EUA will remain  in effect (meaning this test can be used) for the duration of the COVID-19 declaration under Section 564(b)(1) of the Act, 21 U.S.C.section 360bbb-3(b)(1), unless the authorization is terminated  or revoked sooner.       Influenza A by PCR NEGATIVE NEGATIVE Final   Influenza B by PCR NEGATIVE NEGATIVE Final    Comment: (NOTE) The Xpert Xpress SARS-CoV-2/FLU/RSV plus assay is intended as an aid in the diagnosis of influenza from Nasopharyngeal swab specimens and should not be used as a sole basis for treatment. Nasal washings and aspirates are unacceptable for Xpert Xpress SARS-CoV-2/FLU/RSV testing.  Fact Sheet for Patients: EntrepreneurPulse.com.au  Fact Sheet for Healthcare Providers: IncredibleEmployment.be  This test is not yet approved or cleared by the [place] and has been authorized for detection and/or diagnosis of SARS-CoV-2 by FDA under an Emergency Use Authorization (EUA). This EUA will remain in effect (meaning this test can be used) for the duration of the COVID-19 declaration under Section 564(b)(1) of the Act, 21 U.S.C. section 360bbb-3(b)(1), unless the authorization is terminated or revoked.     Resp Syncytial Virus by PCR NEGATIVE NEGATIVE Final    Comment: (NOTE) Fact Sheet for Patients: EntrepreneurPulse.com.au  Fact Sheet for Healthcare Providers: IncredibleEmployment.be  This test is not yet approved or cleared by the [place] FDA and has been authorized for detection and/or diagnosis of SARS-CoV-2 by FDA under an Emergency Use Authorization (EUA). This EUA will remain in effect (meaning this test can be used) for the duration of the COVID-19 declaration under Section 564(b)(1) of the Act, 21 U.S.C. section 360bbb-3(b)(1), unless the authorization is terminated or revoked.  Performed at [facility],  [place] [address], [place], [place] [address]   Blood culture (routine x 2)     Status: None (Preliminary result)   Collection Time: 02/05/23  4:01 AM   Specimen: BLOOD  Result Value Ref Range Status   Specimen Description   Final    BLOOD BLOOD RIGHT HAND Performed at [facility] [address], [place], [place] [address]    Special Requests   Final    BOTTLES DRAWN AEROBIC AND ANAEROBIC Blood Culture adequate volume Performed at [facility] [address] [place], [place] [address]    Culture   Final    NO GROWTH 4 DAYS Performed at [facility], [place] [address], [place], [place] [address]    Report Status PENDING  Incomplete  Blood culture (routine x 2)     Status: None (Preliminary result)   Collection Time: 02/05/23  4:03 AM   Specimen: BLOOD  Result Value Ref Range Status   Specimen Description   Final    BLOOD BLOOD RIGHT FOREARM Performed at [facility] [address] [place], [place] [address]    Special Requests   Final    BOTTLES DRAWN AEROBIC AND ANAEROBIC Blood Culture adequate volume Performed at [facility] [address] [place], [place] [address]    Culture   Final    NO GROWTH 4 DAYS Performed at [facility], [place] [address], [place], [place] [address]    Report Status PENDING  Incomplete     Radiology Studies: DG CHEST PORT 1 VIEW  Result Date: 02/08/2023 CLINICAL DATA:  Short of breath EXAM: PORTABLE CHEST 1 VIEW COMPARISON:  02/04/2023 FINDINGS: Single frontal view of the chest demonstrates stable postsurgical changes from left pneumonectomy. Stable left chest wall port, tip overlying superior vena cava. Increased interstitial prominence throughout the right lung is unchanged, with stable patchy consolidation at the right lung base and fiduciary markers again noted. No right effusion or pneumothorax. Stable postsurgical changes from left thoracotomy.  IMPRESSION: 1. Chronic interstitial scarring throughout the right lung. No superimposed pneumonia. 2. Continued patchy right basilar consolidation and underlying fiduciary markers, compatible with patient's known history of spiculated right middle lobe nodule seen on recent CT. Electronically Signed   By: Randa Ngo M.D.   On: 02/08/2023 10:06   US RENAL  Result Date: 02/07/2023 CLINICAL DATA:  Renal dysfunction EXAM: RENAL / URINARY TRACT ULTRASOUND COMPLETE COMPARISON:  Abdomen sonogram done on 03/06/2015,  CT done on 01/27/2023 FINDINGS: Right Kidney: Renal measurements: 10.1 x 5.5 x 5.5 cm = volume: 147.4 mL. There is no hydronephrosis. There is no focal cortical thinning. There is increased cortical echogenicity. Left Kidney: Renal measurements: 9.6 x 4.7 x 4.6 cm = volume: 108.1 mL. There is increased cortical echogenicity. There is no hydronephrosis or perinephric fluid collection. Bladder: Appears normal for degree of bladder distention. Other: None. IMPRESSION: There is no hydronephrosis. There is increased cortical echogenicity in both kidneys which may be a technical artifact or suggestive medical renal disease. Electronically Signed   By: Elmer Picker M.D.   On: 02/07/2023 15:08    Scheduled Meds:  acetaminophen  1,000 mg Oral Once   acetaminophen  1,000 mg Oral Q8H   aspirin EC  81 mg Oral Daily   bisacodyl  10 mg Rectal Once   enoxaparin (LOVENOX) injection  40 mg Subcutaneous Q24H   gabapentin  300 mg Oral TID   levothyroxine  75 mcg Oral Daily   lidocaine  1 patch Transdermal Q24H   midodrine  5 mg Oral TID WC   mometasone-formoterol  2 puff Inhalation BID   montelukast  10 mg Oral QHS   polyethylene glycol  17 g Oral Daily   predniSONE  50 mg Oral Q breakfast   rosuvastatin  5 mg Oral Daily   senna  2 tablet Oral Once   senna-docusate  2 tablet Oral BID   sodium bicarbonate  650 mg Oral BID   Continuous Infusions:  cefTRIAXone (ROCEPHIN)  IV 2 g (02/09/23 0223)      LOS: 4 days   Shelly Coss, MD [facility] P3/27/2024, 12:29 PM

## 2023-02-09 NOTE — TOC Progression Note (Signed)
Transition of Care [facility]) - Progression Note    Patient Details  Name: Ronald [name] MRN: [id number] Date of Birth: [birth year]  Transition of Care [facility]) CM/SW [place], LCSW Phone Number: 02/09/2023, 11:16 AM  Clinical Narrative:    Met with pt and confirmed plan to return home with palliative care services. Pt prefers services through [facility] A referral has been made to [facility] for palliative care.    Expected Discharge Plan: [place] Barriers to Discharge: No Barriers Identified  Expected Discharge Plan and Services In-house Referral: NA Discharge Planning Services: NA Post Acute Care Choice: Home Health, Durable Medical Equipment Living arrangements for the past 2 months: Apartment                           HH Arranged: PT [place]: [place] Date [place]: 02/08/23 Time Holiday: 1302 Representative spoke with at [place]: [place] Determinants of Health ([place]) Interventions [place]: Food Insecurity Present (02/05/2023)  Housing: Low Risk  (02/05/2023)  Transportation Needs: No Transportation Needs (02/05/2023)  Utilities: Not At Risk (02/05/2023)  Tobacco Use: Medium Risk (02/04/2023)    Readmission Risk Interventions    02/09/2023   11:15 AM 02/07/2023    2:23 PM  Readmission Risk Prevention Plan  Transportation Screening Complete Complete  Medication Review (RN Care Manager) Complete Complete  PCP or Specialist appointment within 3-5 days of discharge Complete Complete  HRI or [place]  Complete  SW Recovery Care/Counseling Consult  Complete  [place]  Not Applicable

## 2023-02-09 NOTE — Progress Notes (Signed)
Occupational Therapy Treatment Patient Details Name: Ronald [name] MRN: [id number] DOB: 1956/08/13 Today's Date: 02/09/2023   History of present illness 67 year old male with history of  COPD (on 1-2L home O2), coronary artery disease, lung cancer status post left pneumonectomy/chemo/radiation who presented with complaint of pleuritic chest pain, fatigue, chills, dyspnea, wheezing, productive cough but no fever. Dx of PNA. Pt also c/o L knee pain, xray negative for fracture, uric acids levels noted to be elevated.   OT comments  Patient was noted to make progress towards goals. Patient was able to complete LB dressing with reduced SOB with AE sitting in recliner. Patient reported he would look into getting a total hip kit. Patient's discharge plan remains appropriate at this time. OT will continue to follow acutely.     Recommendations for follow up therapy are one component of a multi-disciplinary discharge planning process, led by the attending physician.  Recommendations may be updated based on patient status, additional functional criteria and insurance authorization.    Assistance Recommended at Discharge Frequent or constant Supervision/Assistance  Patient can return home with the following  A lot of help with walking and/or transfers;A lot of help with bathing/dressing/bathroom;Assistance with cooking/housework;Direct supervision/assist for medications management;Assist for transportation;Help with stairs or ramp for entrance;Direct supervision/assist for financial management   Equipment Recommendations  None recommended by OT       Precautions / Restrictions Precautions Precautions: Fall Precaution Comments: monitor O2 Restrictions Weight Bearing Restrictions: No       Mobility Bed Mobility Overal bed mobility: Modified Independent                           Balance Overall balance assessment: Needs assistance Sitting-balance support: Feet supported Sitting  balance-Leahy Scale: Good     Standing balance support: Single extremity supported Standing balance-Leahy Scale: Fair                 ADL either performed or assessed with clinical judgement   ADL Overall ADL's : Needs assistance/impaired                     Lower Body Dressing: Set up;Supervision/safety;Sitting/lateral leans Lower Body Dressing Details (indicate cue type and reason): patient was educated on AE with total hip kit brought into room. patient was able to doff socks with supervision with reacher and don with supervision using sock aid. patient reported he liked these tools. patient and sister who was in room were educated on total hip kit. patient to look into if insurance would pay for it. patietn was educated on how sometimes it did not cover and other places to purchase. patient verbalized understanding. patient was educated on donning pants with reacher with supervision to don pillow over feet for simulated pants. patient verbalized and demonstrated understanding. patient provided with handout of total hip kit for carryover after session.               General ADL Comments: patient was noted to have SOB in room with long O2 cord. patient was noted to drop to 87% on 2L/min with two extensions. patient was educated on deep breathing with patient needing increased time to implement and noted to be inconsistent with implementation. patient was able to return to 90s within 1 min. patient was uanble to safely navigate long O2 cord to transfer from bed to recliner in room. patient reported he does not need to use a long cord at home he  uses portable machiene everywhere. patient was educated on proepr management of O2 line in room. patient verbalized understanding.      Cognition Arousal/Alertness: Awake/alert Behavior During Therapy: WFL for tasks assessed/performed Overall Cognitive Status: Within Functional Limits for tasks assessed                                  General Comments: patients sister was present in room                   Pertinent Vitals/ Pain       Pain Assessment Pain Assessment: Faces Faces Pain Scale: Hurts a little bit Pain Location: L knee with walking Pain Descriptors / Indicators: Sharp, Discomfort, Constant Pain Intervention(s): Limited activity within patient's tolerance, Monitored during session, Repositioned         Frequency  Min 2X/week        Progress Toward Goals  OT Goals(current goals can now be found in the care plan section)  Progress towards OT goals: Progressing toward goals     Plan Discharge plan remains appropriate       AM-PAC OT "6 Clicks" Daily Activity     Outcome Measure   Help from another person eating meals?: None Help from another person taking care of personal grooming?: A Little Help from another person toileting, which includes using toliet, bedpan, or urinal?: A Little Help from another person bathing (including washing, rinsing, drying)?: A Little Help from another person to put on and taking off regular upper body clothing?: A Little Help from another person to put on and taking off regular lower body clothing?: A Little (with AE) 6 Click Score: 19    End of Session Equipment Utilized During Treatment: Gait belt;Other (comment) (total hip kit)  OT Visit Diagnosis: Unsteadiness on feet (R26.81);Other abnormalities of gait and mobility (R26.89);Muscle weakness (generalized) (M62.81);Pain   Activity Tolerance Patient tolerated treatment well   Patient Left in chair;with call bell/phone within reach;with family/visitor present   Nurse Communication Mobility status        Time: 1300-1331 OT Time Calculation (min): 31 min  Charges: OT General Charges $OT Visit: 1 Visit OT Treatments $Self Care/Home Management : 23-37 mins  Rennie Plowman, MS Acute Rehabilitation Department Office# [phone]   Willa Rough 02/09/2023, 4:01 PM

## 2023-02-09 NOTE — Progress Notes (Signed)
[facility]) Hospital Liaison Note  Notified by Normajean Baxter of patient/family request for [facility] Palliative services at home after discharge.   [facility] liaison will follow patient for discharge disposition.   Please call with any Hospice/Palliative related questions or concerns.   Thank you for the opportunity to participate in this patient's care  Jhonnie Garner RN, [facility] [phone]

## 2023-02-09 NOTE — Telephone Encounter (Signed)
Ronald [name] sign off on referral?    Today -Social Worker note -"Met with pt and confirmed plan to return home with palliative care services. Pt prefers services through [facility]. A referral has been made to [facility] for palliative care."  Per Dr. Julien Nordmann , I told Lexine Baton, that  he Ronald be the pt attending for palliative care.

## 2023-02-09 NOTE — Progress Notes (Signed)
Daily Progress Note   Patient Name: Ronald [name]       Date: 02/09/2023 DOB: June 09, 1956  Age: 67 y.o. MRN#: [id number] Attending Physician: Shelly Coss, MD Primary Care Physician: System, Provider Not In Admit Date: 02/04/2023 Length of Stay: 4 days  Reason for Consultation/Follow-up: Establishing goals of care and symptom management  Subjective:   CC: Patient still having pain on right side of chest where cancer is located.  Following up regarding complex medical decision making and symptom management.  Subjective: Reviewed EMR prior to seeing patient.  Patient has received oxycodone 10 mg x 1 dose in past 24 hours.  Presented to bedside to check on patient.  Patient laying in bed without family present.  Patient noting continued pain on right side of chest where cancer is located.  Patient does feel that the oxycodone 10 mg dose helped with the pain management.  Patient is not able to give a lot of details regarding this.  Noted patient has only received 1 dose so likely needs to receive multiple doses to see if helps with pain management as then could consider long-acting medication.  Patient requesting dose now also noted would ask RN to bring. Patient denied any bowel movement since yesterday and still feels constipated.  Noted would provide extra doses of senna today.  If no bowel movement by later today, may need to consider suppository use.  Patient able to discuss that he is contemplating a lot of emotions today.  Patient realizing how ill he is and that his cancer "has got him".  Spent time providing emotional support via active listening.  Patient requesting prayers and noted would appreciate visit from chaplain; noted would assist with facilitating this.  Again encouraged patient to reach out to his son in [place] to further discuss how patient wants to spend his time moving forward knowing no one is guaranteed time with their loved ones.  Patient acknowledges and noted he  would plan to reach out to his son.  Patient still awaiting call from [place] regarding possible MitraClip intervention.  All questions answered at that time.  Thanked patient for allowing me to visit with him today.  Discussed care with bedside RN.   Objective:   Vital Signs:  BP 102/71 (BP Location: Right Arm)   Pulse 77   Temp 97.6 F (36.4 C) (Oral)   Resp 18   Ht 5' (1.524 m)   Wt 68.8 kg   SpO2 97%   BMI 29.62 kg/m   Physical Exam: General: NAD, alert, laying in bed Eyes: No drainage noted HENT:  moist mucous membranes Cardiovascular: RRR Respiratory: no increased work of breathing noted, not in respiratory distress Abdomen: not distended Extremities: Moving all appropriately Skin: no rashes or lesions on visible skin Neuro: A&Ox4, following commands easily Psych: sad  Imaging:  I personally reviewed recent imaging.   Assessment & Plan:   Assessment: Patient is a 67 year old male with a past medical history of COPD, CAD, stage IV lung cancer status post left pneumonectomy/chemo/radiation, and mitral regurgitation who was admitted on 02/04/2023 for management of pleuritic chest pain, fatigue, dyspnea, and productive cough. Imaging during hospitalization showed signs for community-acquired pneumonia. Echo obtained during hospitalization showed decreased EF of 45-50%; cardiology following along. Patient has known severe mitral regurgitation and had followed up with [facility] and [facility] for [facility]. Both [facility] and [facility] deemed that patient was not a candidate for surgical repair. Patient is currently awaiting word from [place] as to if patient will  be a MitraClip repair candidate. Palliative medicine team consulted to assist with complex medical decision making.   Recommendations/Plan: # Complex medical decision making/goals of care:   -Patient still awaiting phone call from [place] to determine candidacy regarding MitraClip.  Patient had previously heard he is not a surgical candidate  (from [facility] or [facility]).  Patient had expressed hope for more quality time moving forward with his grandchildren.  Again emphasized importance of conversations with family and discussing how he wants to spend his quality time moving forward (son and grandchildren in [place]).                                -Have not discussed with patient but based on his NYHA Class IV/ stage IV NSCLC diagnosis, patient is a candidate for hospice though currently patient's goals for medical care did not align with hospice philosophy.                 -  Code Status: Full Code                                -Discussed in detail with patient full code versus DNR, continue appropriate medical care on 3/26. Patient emotional not wanting to discuss this further today.    # Symptom management                -Pain, acute on chronic in setting of metastatic NSCLC                                -Continue Tylenol 1000 mg every 8 hours during the day                               -Continue oxycodone to 10 mg every 6 hours as needed.  Patient did not feel any improvement in pain with 5 mg dose.                               -Continue lidocaine patch                               -Patient noted improvement of pain with ibuprofen though would need to use sparingly based on patient's renal function in setting of CHF.                               -On prednisone which can assist with pain management.                  -Constipation                               -Continue MiraLAX 17 g daily                               -Continue senna to 2 tabs twice daily. Provide extra dose of two tabs today.  Insomnia                               Normally on Ambien nightly.  Defer to hospitalist regarding continued use of this medication.  # Psychosocial Support: - Support System: sons, brother, sister                - Placed chaplain consult today to assist with spiritual support.   # Discharge Planning: Home with PT. Have  recommended home palliative medicine referral. TOC consult already placed.   Thank you for allowing the palliative care team to participate in the care Burnetta Sabin.  Chelsea Aus, DO Palliative Care Provider PMT # [phone]  If patient remains symptomatic despite maximum doses, please call PMT at [phone] between 0700 and 1900. Outside of these hours, please call attending, as PMT does not have night coverage.  This provider spent a total of 38 minutes providing patient's care.  Includes review of EMR, discussing care with other staff members involved in patient's medical care, obtaining relevant history and information from patient and/or patient's family, and personal review of imaging and lab work. Greater than 50% of the time was spent counseling and coordinating care related to the above assessment and plan.    *Please note that this is a verbal dictation therefore any spelling or grammatical errors are due to the "[place] One" system interpretation.

## 2023-02-09 NOTE — Progress Notes (Signed)
Rounding Note    Patient Name: Ronald [name] Date of Encounter: 02/09/2023  [facility] Cardiologist: Janina Mayo, MD    Subjective    67 yo with hx of lung cancer, s/p left pneumonectomy, recurrent right lung cancer , COPD Severe MR  Has been seen by [facility] and [facility] and is not a surgical candidate I have called [facility] and spoken to Mcarthur Rossetti, PA ( for [facility] cardiothoracic surgery)   Asaya is to be discussed at the combined valve team meeting to determine if he is a candidate for mitra-clip  I have not head back from Dr. Ludger Nutting ( Cardiology at [facility]) or Alissa   They will reach out to Mr. Concepcion Elk if he is a candidate   He is breathing better today   Inpatient Medications    Scheduled Meds:  acetaminophen  1,000 mg Oral Once   acetaminophen  1,000 mg Oral Q8H   aspirin EC  81 mg Oral Daily   bisacodyl  10 mg Rectal Once   enoxaparin (LOVENOX) injection  40 mg Subcutaneous Q24H   gabapentin  300 mg Oral TID   levothyroxine  75 mcg Oral Daily   lidocaine  1 patch Transdermal Q24H   midodrine  5 mg Oral TID WC   mometasone-formoterol  2 puff Inhalation BID   montelukast  10 mg Oral QHS   polyethylene glycol  17 g Oral Daily   [START ON 02/16/2023] predniSONE  10 mg Oral Q breakfast   [START ON 02/13/2023] predniSONE  20 mg Oral Q breakfast   [START ON 02/10/2023] predniSONE  30 mg Oral Q breakfast   rosuvastatin  5 mg Oral Daily   senna  2 tablet Oral Once   senna-docusate  2 tablet Oral BID   sodium bicarbonate  650 mg Oral BID   Continuous Infusions:  cefTRIAXone (ROCEPHIN)  IV 2 g (02/09/23 0223)   PRN Meds: ipratropium-albuterol, LORazepam, nitroGLYCERIN, ondansetron **OR** ondansetron (ZOFRAN) IV, oxyCODONE, sodium chloride   Vital Signs    Vitals:   02/08/23 1918 02/08/23 1950 02/08/23 2248 02/09/23 0730  BP:  102/71    Pulse:  77    Resp:  18    Temp:  97.6 F (36.4 C)    TempSrc:  Oral    SpO2: 92% 98% 93% 97%  Weight:       Height:        Intake/Output Summary (Last 24 hours) at 02/09/2023 1238 Last data filed at 02/09/2023 0745 Gross per 24 hour  Intake 340 ml  Output 380 ml  Net -40 ml      02/05/2023    1:31 AM 01/27/2023   10:24 AM 01/25/2023   11:13 AM  Last 3 Weights  Weight (lbs) 151 lb 10.8 oz 151 lb 12.8 oz 148 lb 8 oz  Weight (kg) 68.8 kg 68.856 kg 67.359 kg      Telemetry     Nsr  ato 90  Personally Reviewed  ECG     - Personally Reviewed  Physical Exam   GEN: No acute distress.   Neck: No JVD Cardiac: RRR, 3/6 systolic murmur  Respiratory: Clear to auscultation bilaterally. GI: Soft, nontender, non-distended  MS: No edema; No deformity. Neuro:  Nonfocal  Psych: Normal affect   Labs    High Sensitivity Troponin:   Recent Labs  Lab 02/04/23 1944 02/04/23 2158  TROPONINIHS 56* 59*     Chemistry Recent Labs  Lab 02/06/23 [phone] 02/07/23 0459  02/08/23 0536 02/09/23 0552  NA 134* 130* 132* 133*  K 4.2 4.9 5.0 5.0  CL 104 99 105 105  CO2 21* 19* 18* 18*  GLUCOSE 116* 191* 143* 122*  BUN 24* 38* 41* 45*  CREATININE 1.58* 2.37* 1.44* 1.33*  CALCIUM 8.6* 8.2* 8.3* 8.6*  PROT 6.9  --   --   --   ALBUMIN 3.3*  --   --   --   AST 24  --   --   --   ALT 22  --   --   --   ALKPHOS 66  --   --   --   BILITOT 0.6  --   --   --   GFRNONAA 48* 30* 54* 59*  ANIONGAP 9 12 9 10     Lipids No results for input(s): "CHOL", "TRIG", "HDL", "LABVLDL", "LDLCALC", "CHOLHDL" in the last 168 hours.  Hematology Recent Labs  Lab 02/06/23 0513 02/07/23 0459 02/08/23 0536  WBC 6.3 7.4 9.8  RBC 4.00* 3.78* 3.63*  HGB 11.7* 11.2* 10.8*  HCT 35.5* 33.6* 32.2*  MCV 88.8 88.9 88.7  MCH 29.3 29.6 29.8  MCHC 33.0 33.3 33.5  RDW 17.4* 17.3* 17.3*  PLT 283 266 277   Thyroid No results for input(s): "TSH", "FREET4" in the last 168 hours.  BNP Recent Labs  Lab 02/04/23 1944  BNP 754.7*    DDimer No results for input(s): "DDIMER" in the last 168 hours.   Radiology    DG CHEST  PORT 1 VIEW  Result Date: 02/08/2023 CLINICAL DATA:  Short of breath EXAM: PORTABLE CHEST 1 VIEW COMPARISON:  02/04/2023 FINDINGS: Single frontal view of the chest demonstrates stable postsurgical changes from left pneumonectomy. Stable left chest wall port, tip overlying superior vena cava. Increased interstitial prominence throughout the right lung is unchanged, with stable patchy consolidation at the right lung base and fiduciary markers again noted. No right effusion or pneumothorax. Stable postsurgical changes from left thoracotomy. IMPRESSION: 1. Chronic interstitial scarring throughout the right lung. No superimposed pneumonia. 2. Continued patchy right basilar consolidation and underlying fiduciary markers, compatible with patient's known history of spiculated right middle lobe nodule seen on recent CT. Electronically Signed   By: Randa Ngo M.D.   On: 02/08/2023 10:06   US RENAL  Result Date: 02/07/2023 CLINICAL DATA:  Renal dysfunction EXAM: RENAL / URINARY TRACT ULTRASOUND COMPLETE COMPARISON:  Abdomen sonogram done on 03/06/2015, CT done on 01/27/2023 FINDINGS: Right Kidney: Renal measurements: 10.1 x 5.5 x 5.5 cm = volume: 147.4 mL. There is no hydronephrosis. There is no focal cortical thinning. There is increased cortical echogenicity. Left Kidney: Renal measurements: 9.6 x 4.7 x 4.6 cm = volume: 108.1 mL. There is increased cortical echogenicity. There is no hydronephrosis or perinephric fluid collection. Bladder: Appears normal for degree of bladder distention. Other: None. IMPRESSION: There is no hydronephrosis. There is increased cortical echogenicity in both kidneys which may be a technical artifact or suggestive medical renal disease. Electronically Signed   By: Elmer Picker M.D.   On: 02/07/2023 15:08    Cardiac Studies      Patient Profile     67 y.o. male   Assessment & Plan      Pneumonia :  improving with antibiotics  2. CHF :  due to severe MR , acute on  chronic combined CHF.  Stable .   Appears to be euvolumic at this poijnt   3.  MR :  I have called and  spoken to the PA for [facility] surgery  ( Alissa Binford) who has sent a message to Dr. Cheree Ditto ( CV surgery) and Dr. Derinda Sis ( interventional cardiology) They will be discussing Mr. Kear to determine the best course of action .   At present I would not make any medication changes.   BP is marginal .   He may not tolerate restarting his lasix and spironolactone yet   Will see how he is doing tomorrow .         For questions or updates, please contact [place] Please consult [email or web address] for contact info under        Signed, Mertie Moores, MD  02/09/2023, 12:38 PM

## 2023-02-09 NOTE — Progress Notes (Signed)
Chaplain attempted two visits today but Ronald [name] was visiting with his sister and then sleeping on later visit. Chaplain will follow-up tomorrow.     02/09/23 1400  Spiritual Encounters  Type of Visit Initial  Care provided to: Pt not available

## 2023-02-10 ENCOUNTER — Other Ambulatory Visit (HOSPITAL_COMMUNITY): Payer: Self-pay

## 2023-02-10 ENCOUNTER — Encounter: Payer: Self-pay | Admitting: Internal Medicine

## 2023-02-10 DIAGNOSIS — J189 Pneumonia, unspecified organism: Secondary | ICD-10-CM | POA: Diagnosis not present

## 2023-02-10 DIAGNOSIS — G893 Neoplasm related pain (acute) (chronic): Secondary | ICD-10-CM | POA: Diagnosis not present

## 2023-02-10 DIAGNOSIS — R0602 Shortness of breath: Secondary | ICD-10-CM | POA: Diagnosis not present

## 2023-02-10 DIAGNOSIS — Z515 Encounter for palliative care: Secondary | ICD-10-CM | POA: Diagnosis not present

## 2023-02-10 DIAGNOSIS — I5022 Chronic systolic (congestive) heart failure: Secondary | ICD-10-CM | POA: Diagnosis not present

## 2023-02-10 LAB — CULTURE, BLOOD (ROUTINE X 2)
Culture: NO GROWTH
Culture: NO GROWTH
Special Requests: ADEQUATE
Special Requests: ADEQUATE

## 2023-02-10 LAB — BASIC METABOLIC PANEL
Anion gap: 9 (ref 5–15)
BUN: 38 mg/dL — ABNORMAL HIGH (ref 8–23)
CO2: 18 mmol/L — ABNORMAL LOW (ref 22–32)
Calcium: 8.6 mg/dL — ABNORMAL LOW (ref 8.9–10.3)
Chloride: 106 mmol/L (ref 98–111)
Creatinine, Ser: 1.35 mg/dL — ABNORMAL HIGH (ref 0.61–1.24)
GFR, Estimated: 58 mL/min — ABNORMAL LOW (ref >60)
Glucose, Bld: 97 mg/dL (ref 70–99)
Potassium: 5.1 mmol/L (ref 3.5–5.1)
Sodium: 133 mmol/L — ABNORMAL LOW (ref 135–145)

## 2023-02-10 MED ORDER — OXYCODONE HCL 10 MG PO TABS
10.0000 mg | ORAL_TABLET | Freq: Four times a day (QID) | ORAL | 0 refills | Status: DC | PRN
  Filled 2023-02-10: qty 28, 7d supply, fill #0

## 2023-02-10 MED ORDER — HEPARIN SOD (PORK) LOCK FLUSH 100 UNIT/ML IV SOLN
500.0000 [IU] | INTRAVENOUS | Status: DC | PRN
  Filled 2023-02-10: qty 5

## 2023-02-10 MED ORDER — SENNOSIDES-DOCUSATE SODIUM 8.6-50 MG PO TABS
2.0000 | ORAL_TABLET | Freq: Two times a day (BID) | ORAL | 0 refills | Status: DC
  Filled 2023-02-10: qty 60, 15d supply, fill #0

## 2023-02-10 MED ORDER — PREDNISONE 10 MG PO TABS
10.0000 mg | ORAL_TABLET | Freq: Every day | ORAL | 0 refills | Status: AC
  Filled 2023-02-10: qty 3, 3d supply, fill #0

## 2023-02-10 MED ORDER — SODIUM BICARBONATE 650 MG PO TABS
650.0000 mg | ORAL_TABLET | Freq: Two times a day (BID) | ORAL | 0 refills | Status: DC
  Filled 2023-02-10: qty 60, 30d supply, fill #0

## 2023-02-10 MED ORDER — LIDOCAINE 5 % EX PTCH
1.0000 | MEDICATED_PATCH | CUTANEOUS | 0 refills | Status: DC
  Filled 2023-02-10: qty 30, 30d supply, fill #0

## 2023-02-10 MED ORDER — POLYETHYLENE GLYCOL 3350 17 G PO PACK
17.0000 g | PACK | Freq: Every day | ORAL | 0 refills | Status: DC
  Filled 2023-02-10: qty 14, 14d supply, fill #0

## 2023-02-10 MED ORDER — PREDNISONE 10 MG PO TABS
30.0000 mg | ORAL_TABLET | Freq: Every day | ORAL | 0 refills | Status: AC
  Filled 2023-02-10: qty 6, 2d supply, fill #0

## 2023-02-10 MED ORDER — ACETAMINOPHEN 500 MG PO TABS
1000.0000 mg | ORAL_TABLET | Freq: Three times a day (TID) | ORAL | 0 refills | Status: DC | PRN
  Filled 2023-02-10: qty 60, 10d supply, fill #0

## 2023-02-10 MED ORDER — PREDNISONE 20 MG PO TABS
20.0000 mg | ORAL_TABLET | Freq: Every day | ORAL | 0 refills | Status: AC
  Filled 2023-02-10: qty 2, 2d supply, fill #0

## 2023-02-10 MED ORDER — MIDODRINE HCL 5 MG PO TABS
5.0000 mg | ORAL_TABLET | Freq: Three times a day (TID) | ORAL | 0 refills | Status: DC
  Filled 2023-02-10: qty 90, 30d supply, fill #0

## 2023-02-10 NOTE — Progress Notes (Signed)
Daily Progress Note   Patient Name: Ronald [name]       Date: 02/10/2023 DOB: [birth year]  Age: 67 y.o. MRN#: [id number] Attending Physician: Shelly Coss, MD Primary Care Physician: System, Provider Not In Admit Date: 02/04/2023 Length of Stay: 5 days  Reason for Consultation/Follow-up: Establishing goals of care and symptom management  Subjective:   CC: Patient feels pain improved on oxycodone. Following up regarding complex medical decision making and symptom management.  Subjective: Reviewed EMR prior to seeing patient.  Patient has received oxycodone 10 mg x 3 doses in past 24 hours.  Patient seen sitting up in bedside chair today.  Patient notes that he is still awaiting further information from [place].  Patient is wondering when he will be able to go home to follow-up on this.  Noted would defer to hospitalist regarding discharge planning. Patient able to state that his right-sided chest pain has improved with the oxycodone 10 mg dosing. When inquiring about patient's plans for medical care moving forward based on what [facility] let him know, patient states he is taking things day-to-day and is unsure what his plans will be.  Did encourage further discussions with family especially son who is in [place].  Did spend time acknowledging that palliative care is appropriate while patient is continuing to receive further medical interventions though should course of care change, may need to consider hospice involvement. Spent time providing emotional support via active listening.  All questions answered at that time.  Thanked patient for allowing me to visit with him today.  Objective:   Vital Signs:  BP 117/76 (BP Location: Right Arm)   Pulse 76   Temp 97.9 F (36.6 C) (Oral)   Resp 18   Ht 5' (1.524 m)   Wt 68.8 kg   SpO2 90%   BMI 29.62 kg/m   Physical Exam: General: NAD, alert, laying in bed Eyes: No drainage noted HENT:  moist mucous membranes Cardiovascular:  RRR Respiratory: no increased work of breathing noted, not in respiratory distress Abdomen: not distended Extremities: Moving all appropriately Skin: no rashes or lesions on visible skin Neuro: A&Ox4, following commands easily Psych: withdrawn  Imaging:  I personally reviewed recent imaging.   Assessment & Plan:   Assessment: Patient is a 67 year old male with a past medical history of COPD, CAD, stage IV lung cancer status post left pneumonectomy/chemo/radiation, and mitral regurgitation who was admitted on 02/04/2023 for management of pleuritic chest pain, fatigue, dyspnea, and productive cough. Imaging during hospitalization showed signs for community-acquired pneumonia. Echo obtained during hospitalization showed decreased EF of 45-50%; cardiology following along. Patient has known severe mitral regurgitation and had followed up with [facility] and [facility] for evaluation. Both [facility] and [facility] deemed that patient was not a candidate for surgical repair. Patient is currently awaiting word from [place] as to if patient will be a MitraClip repair candidate. Palliative medicine team consulted to assist with complex medical decision making.   Recommendations/Plan: # Complex medical decision making/goals of care:   -Patient still awaiting phone call from [place] to determine candidacy regarding MitraClip.  Patient had previously heard he is not a surgical candidate (from [facility] or [facility]).  Patient had expressed hope for more quality time moving forward with his grandchildren.   - Did discuss with patient today palliative care versus hospice care.  Discussed that should patient not be able to proceed with further interventions for medical care such as MitraClip, may need to consider involvement of hospice in the future.  -Patient  is being discharged with [facility] home palliative care visits who will hopefully be able to continue conversations regarding the medical care patient finds important moving forward. Confirmed with [facility]  liaison about patient receiving their home palliative support.                 -  Code Status: Full Code    -Hopeful home [place] palliative visits can assist with further code discussions as patient has not been wanting to discuss this further during hospitalization.   # Symptom management                -Pain, acute on chronic in setting of metastatic NSCLC                                -Continue Tylenol 1000 mg every 8 hours during the day                               -Continue oxycodone to 10 mg every 6 hours as needed.  Patient did not feel any improvement in pain with 5 mg dose.                               -Continue lidocaine patch                               -Patient noted improvement of pain with ibuprofen though would need to use sparingly based on patient's renal function in setting of CHF.                               -On prednisone which can assist with pain management.                  -Constipation                               -Continue MiraLAX 17 g daily                               -Continue senna to 2 tabs twice daily. Provide extra dose of two tabs today.                   Insomnia                               Normally on Ambien nightly.  Defer to hospitalist regarding continued use of this medication.  # Psychosocial Support: - Support System: sons, brother, sister                - Placed chaplain consult today to assist with spiritual support.   # Discharge Planning: Home with PT. Have recommended home palliative medicine referral. TOC consult already placed.   Thank you for allowing the palliative care team to participate in the care Burnetta Sabin.  Chelsea Aus, DO Palliative Care Provider PMT # [phone]  If patient remains symptomatic despite maximum doses, please call PMT at [phone] between 0700 and 1900. Outside of these hours, please call attending, as PMT does  not have night coverage.  This provider spent a total of 36 minutes providing  patient's care.  Includes review of EMR, discussing care with other staff members involved in patient's medical care, obtaining relevant history and information from patient and/or patient's family, and personal review of imaging and lab work. Greater than 50% of the time was spent counseling and coordinating care related to the above assessment and plan.    *Please note that this is a verbal dictation therefore any spelling or grammatical errors are due to the "[place] One" system interpretation.

## 2023-02-10 NOTE — Care Management Important Message (Signed)
Important Message  Patient Details IM Letter given. Name: Ronald [name] MRN: [id number] Date of Birth: [birth year]   Medicare Important Message Given:  Yes     Kerin Salen 02/10/2023, 11:54 AM

## 2023-02-10 NOTE — Progress Notes (Signed)
Rounding Note    Patient Name: Ronald [name] Date of Encounter: 02/10/2023  [facility] Cardiologist: Janina Mayo, MD    Subjective    67 yo with hx of lung cancer, s/p left pneumonectomy, recurrent right lung cancer , COPD Severe MR  Has been seen by [facility] and is not a surgical candidate I have called [facility] and spoken to Mcarthur Rossetti, PA ( for [facility] surgery)   Felix is to be discussed at the combined valve team meeting to determine if he is a candidate for mitra-clip  I have not head back from Dr. Ludger Nutting ( Cardiology at [facility]) or Alissa   They will reach out to Mr. Concepcion Elk if he is a candidate   Up eating lunch Has some dyspnea Will need a breathing treatment after lunch     Inpatient Medications    Scheduled Meds:  acetaminophen  1,000 mg Oral Once   acetaminophen  1,000 mg Oral Q8H   aspirin EC  81 mg Oral Daily   bisacodyl  10 mg Rectal Once   enoxaparin (LOVENOX) injection  40 mg Subcutaneous Q24H   gabapentin  300 mg Oral TID   levothyroxine  75 mcg Oral Daily   lidocaine  1 patch Transdermal Q24H   midodrine  5 mg Oral TID WC   mometasone-formoterol  2 puff Inhalation BID   montelukast  10 mg Oral QHS   polyethylene glycol  17 g Oral Daily   [START ON 02/16/2023] predniSONE  10 mg Oral Q breakfast   [START ON 02/13/2023] predniSONE  20 mg Oral Q breakfast   predniSONE  30 mg Oral Q breakfast   rosuvastatin  5 mg Oral Daily   senna  2 tablet Oral Once   senna-docusate  2 tablet Oral BID   sodium bicarbonate  650 mg Oral BID   Continuous Infusions:   PRN Meds: ipratropium-albuterol, LORazepam, nitroGLYCERIN, ondansetron **OR** ondansetron (ZOFRAN) IV, oxyCODONE, sodium chloride   Vital Signs    Vitals:   02/09/23 1930 02/09/23 2035 02/10/23 0359 02/10/23 0816  BP:  114/78 117/76   Pulse:  78 76   Resp:  17 18   Temp:  97.6 F (36.4 C) 97.9 F (36.6 C)   TempSrc:  Oral Oral   SpO2: 91% 93% 94%  90%  Weight:      Height:        Intake/Output Summary (Last 24 hours) at 02/10/2023 1132 Last data filed at 02/10/2023 0859 Gross per 24 hour  Intake 280 ml  Output --  Net 280 ml       02/05/2023    1:31 AM 01/27/2023   10:24 AM 01/25/2023   11:13 AM  Last 3 Weights  Weight (lbs) 151 lb 10.8 oz 151 lb 12.8 oz 148 lb 8 oz  Weight (kg) 68.8 kg 68.856 kg 67.359 kg      Telemetry      NSR .  Personally Reviewed  ECG     - Personally Reviewed  Physical Exam   GEN: No acute distress.   Neck: No JVD Cardiac: RR , 3/6 systolic murmur  Respiratory: reduced breath sounds left side ,  wheezes r side  GI: Soft, nontender, non-distended  MS: No edema; No deformity. Neuro:  Nonfocal  Psych: Normal affect   Labs    High Sensitivity Troponin:   Recent Labs  Lab 02/04/23 1944 02/04/23 2158  TROPONINIHS 56* 59*  Chemistry Recent Labs  Lab 02/06/23 0513 02/07/23 0459 02/08/23 0536 02/09/23 0552 02/10/23 0555  NA 134*   < > 132* 133* 133*  K 4.2   < > 5.0 5.0 5.1  CL 104   < > 105 105 106  CO2 21*   < > 18* 18* 18*  GLUCOSE 116*   < > 143* 122* 97  BUN 24*   < > 41* 45* 38*  CREATININE 1.58*   < > 1.44* 1.33* 1.35*  CALCIUM 8.6*   < > 8.3* 8.6* 8.6*  PROT 6.9  --   --   --   --   ALBUMIN 3.3*  --   --   --   --   AST 24  --   --   --   --   ALT 22  --   --   --   --   ALKPHOS 66  --   --   --   --   BILITOT 0.6  --   --   --   --   GFRNONAA 48*   < > 54* 59* 58*  ANIONGAP 9   < > 9 10 9    < > = values in this interval not displayed.     Lipids No results for input(s): "CHOL", "TRIG", "HDL", "LABVLDL", "LDLCALC", "CHOLHDL" in the last 168 hours.  Hematology Recent Labs  Lab 02/06/23 0513 02/07/23 0459 02/08/23 0536  WBC 6.3 7.4 9.8  RBC 4.00* 3.78* 3.63*  HGB 11.7* 11.2* 10.8*  HCT 35.5* 33.6* 32.2*  MCV 88.8 88.9 88.7  MCH 29.3 29.6 29.8  MCHC 33.0 33.3 33.5  RDW 17.4* 17.3* 17.3*  PLT 283 266 277    Thyroid No results for input(s):  "TSH", "FREET4" in the last 168 hours.  BNP Recent Labs  Lab 02/04/23 1944  BNP 754.7*     DDimer No results for input(s): "DDIMER" in the last 168 hours.   Radiology    No results found.  Cardiac Studies      Patient Profile     67 y.o. male   Assessment & Plan      Pneumonia :  improving with antibiotics  2. CHF :  due to severe MR , acute on chronic combined CHF.  Stable .   No additional recs   3.  MR :  I have called and spoken to the PA for [facility]  ( Alissa Binford) who has sent a message to Dr. Cheree Ditto ( CV surgery) and Dr. Derinda Sis ( interventional cardiology) They will be discussing Mr. Countess to determine the best course of action .   I have not heard back from the team at [facility] I suspect they will contact him directly if he is a candidate for mitra clip .   No additional recs  Cardiology will sign off.           For questions or updates, please contact [facility] Please consult [email or web address] for contact info under        Signed, Mertie Moores, MD  02/10/2023, 11:32 AM

## 2023-02-10 NOTE — Progress Notes (Signed)
Physical Therapy Treatment Patient Details Name: Ronald [name] MRN: [id number] DOB: [birth year] Today's Date: 02/10/2023   History of Present Illness 67 year old male with history of  COPD (on 1-2L home O2), coronary artery disease, lung cancer status post left pneumonectomy/chemo/radiation who presented with complaint of pleuritic chest pain, fatigue, chills, dyspnea, wheezing, productive cough but no fever. Dx of PNA. Pt also c/o L knee pain, xray negative for fracture, uric acids levels noted to be elevated.    PT Comments    Pt continues to progress toward acute PT goals this session with progression of ambulation distance. Pt ambulated ~48ft with close supervision, no overt LOB observed. Pt remains limited by SOB and L knee pain. Pt will benefit from continued skilled PT to increase their independence and maximize safety with mobility.     Recommendations for follow up therapy are one component of a multi-disciplinary discharge planning process, led by the attending physician.  Recommendations may be updated based on patient status, additional functional criteria and insurance authorization.  Follow Up Recommendations       Assistance Recommended at Discharge Intermittent Supervision/Assistance  Patient can return home with the following A little help with bathing/dressing/bathroom;Assist for transportation;Help with stairs or ramp for entrance   Equipment Recommendations  Rollator (4 wheels) (in personal room and adjusted for pt)    Recommendations for Other Services       Precautions / Restrictions Precautions Precautions: Fall Precaution Comments: monitor O2 Restrictions Weight Bearing Restrictions: No     Mobility  Bed Mobility Overal bed mobility: Modified Independent                  Transfers Overall transfer level: Needs assistance Equipment used: Rollator (4 wheels) Transfers: Sit to/from Stand Sit to Stand: Supervision           General  transfer comment: cues for proper rollator and brake management. sit to stand x1 from EOB and x1 from rollator. Increased time with x1 unsuccessful attempt to stand from rollator surface due to L knee pain.    Ambulation/Gait Ambulation/Gait assistance: Supervision Gait Distance (Feet): 75 Feet Assistive device: Rollator (4 wheels) Gait Pattern/deviations: Decreased stride length, Trunk flexed (mild trunk flexion) Gait velocity: decreased     General Gait Details: pt ambulated 63ft x2 with seated rest break between due to SOB and reported feeling like he is going to "pass out" BP 131/69, 80bpm. Pt took x1 standing rest break during each boutof 53ft prior to seated rest. Pt on 3L upon entry and initialy on 3L during amulation with finger pulse ox reading as low as 80%, titrated up to 4l with recovery. Obtained portable vitals machine tto confirm O2 readings with O2 reading low-mid 90% after rest. 2nd bout of ambulatino with portable machine and O2 low reading of 87% on 3L. Able to recover to 92% on 3L at end of session while in recliner.   Stairs             Wheelchair Mobility    Modified Rankin (Stroke Patients Only)       Balance Overall balance assessment: Needs assistance Sitting-balance support: Feet supported Sitting balance-Leahy Scale: Good     Standing balance support: Bilateral upper extremity supported, During functional activity Standing balance-Leahy Scale: Fair                              Cognition Arousal/Alertness: Awake/alert Behavior During Therapy: WFL for tasks assessed/performed Overall  Cognitive Status: Within Functional Limits for tasks assessed                                          Exercises      General Comments        Pertinent Vitals/Pain Pain Assessment Pain Assessment: Faces Faces Pain Scale: Hurts a little bit Pain Location: L knee with walking Pain Descriptors / Indicators: Sharp,  Discomfort Pain Intervention(s): Limited activity within patient's tolerance, Monitored during session, Repositioned    Home Living                          Prior Function            PT Goals (current goals can now be found in the care plan section) Acute Rehab PT Goals Patient Stated Goal: to breathe better PT Goal Formulation: With patient/family Time For Goal Achievement: 02/21/23 Potential to Achieve Goals: Good Progress towards PT goals: Progressing toward goals    Frequency    Min 3X/week      PT Plan Current plan remains appropriate    Co-evaluation              AM-PAC PT "6 Clicks" Mobility   Outcome Measure  Help needed turning from your back to your side while in a flat bed without using bedrails?: None Help needed moving from lying on your back to sitting on the side of a flat bed without using bedrails?: None Help needed moving to and from a bed to a chair (including a wheelchair)?: A Little Help needed standing up from a chair using your arms (e.g., wheelchair or bedside chair)?: A Little Help needed to walk in hospital room?: A Little Help needed climbing 3-5 steps with a railing? : A Little 6 Click Score: 20    End of Session Equipment Utilized During Treatment: Gait belt;Oxygen Activity Tolerance: Patient limited by fatigue Patient left: in chair;with call bell/phone within reach;with chair alarm set Nurse Communication: Mobility status PT Visit Diagnosis: Difficulty in walking, not elsewhere classified (R26.2);Pain Pain - Right/Left: Left Pain - part of body: Knee     Time: 0922-0954 PT Time Calculation (min) (ACUTE ONLY): 32 min  Charges:  $Therapeutic Activity: 23-37 mins                    Festus Barren PT, DPT  Acute Rehabilitation Services  Office [phone]   02/10/2023, 11:17 AM

## 2023-02-10 NOTE — Discharge Summary (Signed)
Physician Discharge Summary  Ronald [name] [id number] DOB: 02/16/1956 DOA: 02/04/2023  PCP: System, Provider Not In  Admit date: 02/04/2023 Discharge date: 02/10/2023  Admitted From: Home Disposition:  Home  Discharge Condition:Stable CODE STATUS:FULL Diet recommendation: Heart Healthy   Brief/Interim Summary: Patient is a 67 year old male with history of  COPD on 2 L of oxygen at home, coronary artery disease, lung cancer status post left pneumonectomy/chemo/radiation , mitral regurgitation who presented with complaint of pleuritic chest pain, fatigue, chills, dyspnea, wheezing, productive cough but no fever.  Initial vital signs on presentation were stable.  COVID/flu/RSV PCR negative.  Lab work showed elevated BNP of 755, creatinine of 1.3.  Chest x-ray showed interstitial marking in the right lower lung field suspicious for pneumonia.  CT angiogram showed emphysematous changes, possible infiltrate in the right lower lobe, stable 2 cm right middle lobe spiculated nodule.  Patient was admitted for the management of community-acquired pneumonia, started on ceftriaxone and azithromycin.  Echo during this hospitalization shows decreased EF of 45-50%, cardiology consulted.  Due to patient's history of stage IV lung cancer and new finding of CHF, we requested palliative care evaluation.  Palliative care recommended outpatient follow-up.  Cardiology signed off.  He is hemodynamically stable.  Due to his multiple comorbidities including stage IV cancer, he has high risk for readmission in the future.  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  Initial vital signs on presentation were stable.  COVID/flu/RSV PCR negative.  Lab work showed elevated BNP of 755, creatinine of 1.3.  Chest x-ray showed interstitial marking in the right lower lung field suspicious for pneumonia.  CT angiogram showed emphysematous changes, possible infiltrate in the right lower lobe, stable 2 cm right middle lobe spiculated nodule.  Patient was admitted for the management of community-acquired pneumonia, started on ceftriaxone and azithromycin.  Echo during this hospitalization shows decreased EF of 45-50%, cardiology consulted.  Due to patient's history of stage IV lung cancer and new finding of CHF, we requested palliative care evaluation.  Plan for discharge tomorrow to home if remains stable   Assessment & Plan:   Principal Problem:   CAP (community acquired pneumonia) Active Problems:   CAD (coronary artery disease)   Mitral regurgitation   Chronic systolic CHF (congestive heart failure)   COPD exacerbation (HCC)   Essential hypertension   Gastroesophageal reflux disease without esophagitis   Hypothyroidism (acquired)   Mixed hyperlipidemia   Palliative care encounter   SOB (shortness of breath)   Goals of care, counseling/discussion   Cancer associated pain   High risk medication use   Counseling and coordination of care   Need for emotional support   Community-acquired pneumonia: Presented with fatigue, pleuritic chest pain, chills, wheezing, productive cough.Chest x-ray showed interstitial marking in the right lower lung field suspicious for pneumonia.  CT angiogram showed emphysematous changes, possible infiltrate in the right lower lobe, stable 2 cm right middle lobe spiculated nodule.  Patient was admitted for  the management of community-acquired pneumonia, started on ceftriaxone and azithromycin.Follow-up  blood cultures.NGTD.  Negative streptococcal pneumoniae, .  Lactic acid level normal.  Completed  antibiotics.  Follow-up chest x-ray on 3/26 did not show worsening of pneumonia, chronic changes were seen   Chronic hypoxic respiratory failure: On 2 L of oxygen at baseline.  Currently on 2 L of oxygen per minute.    COPD exacerbation: Secondary to pneumonia.  Continue current medications.  Has mild bilateral expiratory wheezing.  Continue prednisone which was ordered for possible gouty arthritis of left knee ,we will taper.  Continue as needed bronchodilators.  Takes Trelegy Ellipta, albuterol at home   History of lung cancer: History of stage IV non-small cell lung cancer, right middle and right upper lobe pulmonary nodules.follows with Dr. Julien Nordmann . History of left pneumonectomy.  Currently on chemotherapy: Carboplatin, paclitaxel, Keytruda   Systolic congestive heart failure: Echo done on this admission showed EF of 45-50%.  Previous echo had shown EF of 50 to 55%.  Cardiology consulted ,signed off.  Takes Lasix, ramipril, spironolactone at home.  Currently these are on hold due to soft blood pressure.  He looks overall euvolemic today.   Left knee pain: Complain of severe pain on 3/24, left knee found to be tender and edematous. [place] of the left knee did not show any fracture or dislocation, mild degenerative changes.  Elevated uric acid level.  Denies any history of gout.  Started on prednisone,now tapering.  Knee keeps looking  much better today.   Coronary artery disease: No anginal symptoms.  On statin, ACE inhibitor at home.  Mild elevated troponin with flat trend, likely from supply demand ischemia, no concern for ACS.  Denies any chest pain.   History of mitral regurgitation: Follows with [facility] cardiology   Hypertension: Blood pressure soft, antihypertensives on hold.On midodrine   GERD: Continue PPI   Hypothyroidism: Continue Synthyroid   Hyperlipidemia: On Crestor   AKI on CKD  stage IIIa: Baseline creatinine ranges from 1.3-1.6.  Creatinine bumped up to the range of 2.3 , started on gentle IV fluids with improvement.  Renal ultrasound did not show any hydronephrosis but showed changes consistent with medical renal disease.Started on bicarb tablets   Constipation: Continue bowel regimen   Debility/deconditioning: Consulted  PT/OT. 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  Palliative care following.  Recommend outpatient follow-up at home with palliative care.  CODE STATUS remain full. I had a long discussion about his disease problems and prognosis  with his sister Deneise Lever  who lives with him and is his caretaker     Discharge Diagnoses:  Principal Problem:   CAP (community acquired pneumonia) Active Problems:   CAD (coronary artery disease)   Mitral regurgitation   Chronic systolic CHF (congestive heart failure)   COPD exacerbation ([place])   Essential hypertension   Gastroesophageal reflux disease without esophagitis   Hypothyroidism (acquired)   Mixed hyperlipidemia   Palliative care encounter   SOB (shortness of breath)   Goals of care, counseling/discussion   Cancer associated pain   High risk medication use   Counseling and coordination of care   Need for emotional support   Acute on chronic combined systolic and diastolic CHF (congestive heart failure) ([place])    Discharge Instructions  Discharge Instructions     Diet - low sodium heart healthy   Complete by: As directed  Discharge instructions   Complete by: As directed    1)Please take prescribed medications as instructed 2)Follow up with your PCP in a week.  Do a CBC, BMP test during the follow-up 3)Follow up with your oncologist.  [facility] 4)Follow up with outpatient palliative care and home health   Increase activity slowly   Complete by: As directed       Allergies  as of 02/10/2023       Reactions   Penicillins Itching        Medication List     STOP taking these medications    budesonide-formoterol 160-4.5 MCG/ACT inhaler Commonly known as: SYMBICORT   furosemide 40 MG tablet Commonly known as: LASIX   hydrochlorothiazide 25 MG tablet Commonly known as: HYDRODIURIL   ramipril 2.5 MG capsule Commonly known as: ALTACE   spironolactone 25 MG tablet Commonly known as: ALDACTONE       TAKE these medications    acetaminophen 500 MG tablet Commonly known as: TYLENOL Take 2 tablets (1,000 mg total) by mouth every 8 (eight) hours as needed.   albuterol 108 (90 Base) MCG/ACT inhaler Commonly known as: VENTOLIN HFA Inhale 2 puffs into the lungs every 4 (four) hours as needed for wheezing or shortness of breath.   aspirin EC 81 MG tablet Take 81 mg by mouth daily. Swallow whole.   gabapentin 300 MG capsule Commonly known as: NEURONTIN Take 300 mg by mouth 3 (three) times daily.   levothyroxine 75 MCG tablet Commonly known as: SYNTHROID Take 75 mcg by mouth daily. What changed: Another medication with the same name was removed. Continue taking this medication, and follow the directions you see here.   lidocaine 5 % Commonly known as: LIDODERM Place 1 patch onto the skin daily. Remove & Discard patch within 12 hours or as directed by MD Start taking on: February 11, 2023   lidocaine-prilocaine cream Commonly known as: EMLA Apply 1 Application topically as needed (To Port-a-Cath).   LORazepam 1 MG tablet Commonly known as: Ativan Take 1 tablet (1 mg total) by mouth 2 (two) times daily as needed for anxiety.   midodrine 5 MG tablet Commonly known as: PROAMATINE Take 1 tablet (5 mg total) by mouth 3 (three) times daily with meals.   montelukast 10 MG tablet Commonly known as: SINGULAIR Take 1 tablet (10 mg total) by mouth at bedtime.   nitroGLYCERIN 0.4 MG SL tablet Commonly known as: NITROSTAT Place 1 tablet (0.4 mg  total) under the tongue every 5 (five) minutes x 3 doses as needed for chest pain.   Oxycodone HCl 10 MG Tabs Take 1 tablet (10 mg total) by mouth every 6 (six) hours as needed for moderate pain or severe pain (shortness of breath).   polyethylene glycol 17 g packet Commonly known as: MIRALAX / GLYCOLAX Take 17 g by mouth daily. Start taking on: February 11, 2023   predniSONE 10 MG tablet Commonly known as: DELTASONE Take 3 tablets (30 mg total) by mouth daily with breakfast for 2 days. Start taking on: February 11, 2023   predniSONE 20 MG tablet Commonly known as: DELTASONE Take 1 tablet (20 mg total) by mouth daily with breakfast for 2 days. Start taking on: February 13, 2023   predniSONE 10 MG tablet Commonly known as: DELTASONE Take 1 tablet (10 mg total) by mouth daily with breakfast for 3 days. Start taking on: February 16, 2023   rosuvastatin 5 MG tablet Commonly known as: CRESTOR  Take 5 mg by mouth daily.   senna-docusate 8.6-50 MG tablet Commonly known as: Senokot-S Take 2 tablets by mouth 2 (two) times daily.   sodium bicarbonate 650 MG tablet Take 1 tablet (650 mg total) by mouth 2 (two) times daily.   Trelegy Ellipta 200-62.5-25 MCG/ACT Aepb Generic drug: Fluticasone-Umeclidin-Vilant Take 1 puff by mouth daily.   zolpidem 10 MG tablet Commonly known as: AMBIEN Take 10 mg by mouth at bedtime.               Durable Medical Equipment  (From admission, onward)           Start     Ordered   02/07/23 1313  For home use only DME 4 wheeled rolling walker with seat  Once       Question:  Patient needs a walker to treat with the following condition  Answer:  Difficulty in walking, not elsewhere classified   02/07/23 1312            Follow-up Information     [facility] Follow up.   Specialty: Home Health Services Why: Alvis Lemmings will follow up with you at home to provide home health services. Contact information: [address]  [place] [address] [phone]                Allergies  Allergen Reactions   Penicillins Itching    Consultations: Cardiology, palliative care   Procedures/Studies: DG CHEST PORT 1 VIEW  Result Date: 02/08/2023 CLINICAL DATA:  Short of breath EXAM: PORTABLE CHEST 1 VIEW COMPARISON:  02/04/2023 FINDINGS: Single frontal view of the chest demonstrates stable postsurgical changes from left pneumonectomy. Stable left chest wall port, tip overlying superior vena cava. Increased interstitial prominence throughout the right lung is unchanged, with stable patchy consolidation at the right lung base and fiduciary markers again noted. No right effusion or pneumothorax. Stable postsurgical changes from left thoracotomy. IMPRESSION: 1. Chronic interstitial scarring throughout the right lung. No superimposed pneumonia. 2. Continued patchy right basilar consolidation and underlying fiduciary markers, compatible with patient's known history of spiculated right middle lobe nodule seen on recent CT. Electronically Signed   By: Randa Ngo M.D.   On: 02/08/2023 10:06   US RENAL  Result Date: 02/07/2023 CLINICAL DATA:  Renal dysfunction EXAM: RENAL / URINARY TRACT ULTRASOUND COMPLETE COMPARISON:  Abdomen sonogram done on 03/06/2015, CT done on 01/27/2023 FINDINGS: Right Kidney: Renal measurements: 10.1 x 5.5 x 5.5 cm = volume: 147.4 mL. There is no hydronephrosis. There is no focal cortical thinning. There is increased cortical echogenicity. Left Kidney: Renal measurements: 9.6 x 4.7 x 4.6 cm = volume: 108.1 mL. There is increased cortical echogenicity. There is no hydronephrosis or perinephric fluid collection. Bladder: Appears normal for degree of bladder distention. Other: None. IMPRESSION: There is no hydronephrosis. There is increased cortical echogenicity in both kidneys which may be a technical artifact or suggestive medical renal disease. Electronically Signed   By: Elmer Picker  M.D.   On: 02/07/2023 15:08   ECHOCARDIOGRAM COMPLETE  Result Date: 02/06/2023    ECHOCARDIOGRAM REPORT   Patient Name:   ORBRA ARNOTT Date of Exam: 02/06/2023 Medical Rec #:  [id number]         Height:       60.0 in Accession #:    [id number]        Weight:       151.7 lb Date of Birth:  [birth year]  BSA:          1.660 m Patient Age:    41 years          BP:           105/77 mmHg Patient Gender: M                 HR:           86 bpm. Exam Location:  Inpatient Procedure: 2D Echo, Color Doppler and Cardiac Doppler Indications:    CAD Native Vessel I25.10, Congestive Heart Failure I50.9  History:        Patient has prior history of Echocardiogram examinations. CAD                 and Previous Myocardial Infarction, COPD; Risk                 Factors:Hypertension.  Sonographer:    Phineas Douglas Referring Phys: [id number] [place]  1. Left ventricular ejection fraction, by estimation, is 45 to 50%. The left ventricle has mildly decreased function. The left ventricle demonstrates global hypokinesis. The left ventricular internal cavity size was mildly dilated. Left ventricular diastolic parameters are consistent with Grade II diastolic dysfunction (pseudonormalization). Elevated left ventricular end-diastolic pressure.  2. Right ventricular systolic function is moderately reduced. The right ventricular size is moderately enlarged.  3. Left atrial size was moderately dilated.  4. Right atrial size was moderately dilated.  5. The mitral valve is abnormal. Moderate mitral valve regurgitation. No evidence of mitral stenosis.  6. Tricuspid valve regurgitation is moderate.  7. The aortic valve is tricuspid. There is mild calcification of the aortic valve. There is mild thickening of the aortic valve. Aortic valve regurgitation is mild. Aortic valve sclerosis is present, with no evidence of aortic valve stenosis.  8. The inferior vena cava is normal in size with greater than 50% respiratory  variability, suggesting right atrial pressure of 3 mmHg. FINDINGS  Left Ventricle: Left ventricular ejection fraction, by estimation, is 45 to 50%. The left ventricle has mildly decreased function. The left ventricle demonstrates global hypokinesis. The left ventricular internal cavity size was mildly dilated. There is  no left ventricular hypertrophy. Left ventricular diastolic parameters are consistent with Grade II diastolic dysfunction (pseudonormalization). Elevated left ventricular end-diastolic pressure. Right Ventricle: The right ventricular size is moderately enlarged. No increase in right ventricular wall thickness. Right ventricular systolic function is moderately reduced. Left Atrium: Left atrial size was moderately dilated. Right Atrium: Right atrial size was moderately dilated. Pericardium: There is no evidence of pericardial effusion. Mitral Valve: Restricted posterior leaflet motion and calcified sub chordal apparatus. The mitral valve is abnormal. There is mild thickening of the mitral valve leaflet(s). There is mild calcification of the mitral valve leaflet(s). Mild mitral annular calcification. Moderate mitral valve regurgitation. No evidence of mitral valve stenosis. Tricuspid Valve: The tricuspid valve is normal in structure. Tricuspid valve regurgitation is moderate . No evidence of tricuspid stenosis. Aortic Valve: The aortic valve is tricuspid. There is mild calcification of the aortic valve. There is mild thickening of the aortic valve. Aortic valve regurgitation is mild. Aortic regurgitation PHT measures 512 msec. Aortic valve sclerosis is present,  with no evidence of aortic valve stenosis. Pulmonic Valve: The pulmonic valve was normal in structure. Pulmonic valve regurgitation is not visualized. No evidence of pulmonic stenosis. Aorta: The aortic root is normal in size and structure. Venous: The inferior vena cava is normal in size with greater  than 50% respiratory variability, suggesting  right atrial pressure of 3 mmHg. IAS/Shunts: No atrial level shunt detected by color flow Doppler.  LEFT VENTRICLE PLAX 2D LVIDd:         4.50 cm      Diastology LVIDs:         3.70 cm      LV e' medial:    3.97 cm/s LV PW:         1.00 cm      LV E/e' medial:  30.0 LV IVS:        1.00 cm      LV e' lateral:   3.73 cm/s LVOT diam:     2.10 cm      LV E/e' lateral: 31.9 LV SV:         48 LV SV Index:   29 LVOT Area:     3.46 cm  LV Volumes (MOD) LV vol d, MOD A2C: 137.0 ml LV vol d, MOD A4C: 93.8 ml LV vol s, MOD A2C: 70.8 ml LV vol s, MOD A4C: 51.4 ml LV SV MOD A2C:     66.2 ml LV SV MOD A4C:     93.8 ml LV SV MOD BP:      52.2 ml RIGHT VENTRICLE RV Basal diam:  5.40 cm RV S prime:     10.90 cm/s TAPSE (M-mode): 2.0 cm LEFT ATRIUM             Index        RIGHT ATRIUM           Index LA diam:        4.10 cm 2.47 cm/m   RA Area:     21.30 cm LA Vol (A2C):   69.2 ml 41.70 ml/m  RA Volume:   67.90 ml  40.91 ml/m LA Vol (A4C):   51.3 ml 30.91 ml/m LA Biplane Vol: 61.2 ml 36.88 ml/m  AORTIC VALVE LVOT Vmax:   77.20 cm/s LVOT Vmean:  51.800 cm/s LVOT VTI:    0.139 m AI PHT:      512 msec  AORTA Ao Root diam: 3.20 cm Ao Asc diam:  3.20 cm MITRAL VALVE                  TRICUSPID VALVE MV Area (PHT): 4.83 cm       TR Peak grad:   49.8 mmHg MV Decel Time: 157 msec       TR Vmax:        353.00 cm/s MR Peak grad:    88.0 mmHg MR Mean grad:    56.0 mmHg    SHUNTS MR Vmax:         469.00 cm/s  Systemic VTI:  0.14 m MR Vmean:        348.0 cm/s   Systemic Diam: 2.10 cm MR PISA:         2.26 cm MR PISA Eff ROA: 15 mm MR PISA Radius:  0.60 cm MV E velocity: 119.00 cm/s MV A velocity: 92.20 cm/s MV E/A ratio:  1.29 Jenkins Rouge MD Electronically signed by Jenkins Rouge MD Signature Date/Time: 02/06/2023/4:52:00 PM    Final    DG Knee 1-2 Views Left  Result Date: 02/06/2023 CLINICAL DATA:  [id number] Swelling [id number] EXAM: LEFT KNEE - 1-2 VIEW COMPARISON:  None Available. FINDINGS: No acute fracture or dislocation. Mild  degenerative changes of the medial compartment with joint space narrowing. No area of erosion or osseous destruction.  No unexpected radiopaque foreign body. Vascular calcifications. No significant joint effusion. IMPRESSION: 1. No acute fracture or dislocation. 2. Mild degenerative changes of the medial compartment. Electronically Signed   By: Valentino Saxon M.D.   On: 02/06/2023 11:58   CT Angio Chest PE W and/or Wo Contrast  Result Date: 02/04/2023 CLINICAL DATA:  Chest pain shortness of breath EXAM: CT ANGIOGRAPHY CHEST WITH CONTRAST TECHNIQUE: Multidetector CT imaging of the chest was performed using the standard protocol during bolus administration of intravenous contrast. Multiplanar CT image reconstructions and MIPs were obtained to evaluate the vascular anatomy. RADIATION DOSE REDUCTION: This exam was performed according to the departmental dose-optimization program which includes automated exposure control, adjustment of the mA and/or kV according to patient size and/or use of iterative reconstruction technique. CONTRAST:  66mL OMNIPAQUE IOHEXOL 350 MG/ML SOLN COMPARISON:  Chest x-ray 02/04/2023, CT chest 01/27/2023, 12/16/2022 FINDINGS: Cardiovascular: Satisfactory opacification of the pulmonary arteries to the segmental level. No evidence of pulmonary embolism on the right. Cardiomegaly. Moderate aortic atherosclerosis without aneurysm. Coronary stent and calcifications. No significant pericardial effusion. Mediastinum/Nodes: Patent trachea. No suspicious thyroid nodule. Shift of mediastinal contents to the left due to history of left pneumonectomy. Esophagus within normal limits. Enlarged right hilar nodes measuring up to 14 mm not significantly changed. Esophagus within normal limits. Lungs/Pleura: Left pneumonectomy with chronic thick-walled fluid in the left thoracic cavity. Emphysema. Diffuse reticular densities throughout the right lung. Spiculated right middle lobe mass measuring 1.6 by 2  cm, previously 2 x 1.8 cm with adjacent fiducial markers. Increased hazy pulmonary density in the right lower lobe compared to prior. Upper Abdomen: No acute finding. Reflux of contrast into the hepatic veins consistent with elevated right heart pressure. Musculoskeletal: Postsurgical changes of the left ribs. No acute or suspicious osseous abnormality. Review of the MIP images confirms the above findings. IMPRESSION: 1. Status post left pneumonectomy. No evidence for acute right pulmonary embolus. 2. Cardiomegaly 3. Emphysema with diffuse interstitial densities throughout the right thorax, similar to previous exams. Slight increased hazy pulmonary density in the right lower lobe could be due to superimposed infectious or inflammatory process. 4. Grossly stable 2 cm right middle lobe spiculated nodule with adjacent fiducial markers Aortic Atherosclerosis (ICD10-I70.0) and Emphysema (ICD10-J43.9). Electronically Signed   By: Donavan Foil M.D.   On: 02/04/2023 23:30   DG Chest Portable 1 View  Result Date: 02/04/2023 CLINICAL DATA:  Chest pain, shortness of breath EXAM: PORTABLE CHEST 1 VIEW COMPARISON:  Previous studies including the chest radiograph done on 12/27/2022 and CT done on 01/27/2023 FINDINGS: There is previous left pneumonectomy. There are few metallic clips in right lower lung field, possibly fiducial markers from previous biopsy increased interstitial markings are seen in right lower lung field. There is interval improvement in the aeration in right upper lung field since 01/27/2023. There is no focal consolidation. Right lateral CP angle is clear. There is no pneumothorax. Tip of left IJ central venous catheter is seen in superior vena cava. IMPRESSION: Increased interstitial markings in right lower lung field may suggest scarring or interstitial pneumonia. There is no focal consolidation. There is no right pleural effusion or pneumothorax. Previous left pneumonectomy. Electronically Signed   By:  Elmer Picker M.D.   On: 02/04/2023 20:05   CT Angio Chest/Abd/Pel for Dissection W and/or Wo Contrast  Result Date: 01/27/2023 CLINICAL DATA:  Sudden onset periumbilical pain. Ongoing chemo for lung cancer. History of left pneumonectomy in 2005. Aortic aneurysm suspected. EXAM: CT ANGIOGRAPHY CHEST, ABDOMEN  AND PELVIS TECHNIQUE: Non-contrast CT of the chest was initially obtained. Multidetector CT imaging through the chest, abdomen and pelvis was performed using the standard protocol during bolus administration of intravenous contrast. Multiplanar reconstructed images and MIPs were obtained and reviewed to evaluate the vascular anatomy. RADIATION DOSE REDUCTION: This exam was performed according to the departmental dose-optimization program which includes automated exposure control, adjustment of the mA and/or kV according to patient size and/or use of iterative reconstruction technique. CONTRAST:  36mL OMNIPAQUE IOHEXOL 350 MG/ML SOLN COMPARISON:  CT chest abdomen and pelvis 12/16/2022 FINDINGS: CTA CHEST FINDINGS Cardiovascular: Preferential opacification of the thoracic aorta. No evidence of thoracic aortic aneurysm or dissection. Cardiomegaly. Dilated right ventricle with reflux of contrast into the hepatic veins compatible with elevated right heart pressures. Coronary stenting and coronary artery calcification. Mixed density aortic atherosclerotic calcification without significant narrowing. Small pericardial effusion. Left chest wall Port-A-Cath tip in the low SVC. Mediastinum/Nodes: Leftward deviation of the mediastinum secondary to left pneumonectomy. Unremarkable esophagus. 16 mm right hilar node (6/39) is unchanged from 10/14/2013. Lungs/Pleura: Left pneumonectomy. Diffuse interlobular septal thickening and patchy ground-glass opacities in the right lung. Fiducial markers noted next to an unchanged irregularly marginated 2.3 cm pulmonary nodule. There are adjacent clustered micro nodules in the  right middle lobe. No pneumothorax or pleural effusion. Musculoskeletal: Left sixth rib osteotomy. No acute fracture. No destructive osseous lesion. Review of the MIP images confirms the above findings. CTA ABDOMEN AND PELVIS FINDINGS VASCULAR Aorta: No aneurysm or dissection. Advanced mixed density atherosclerotic plaque throughout the abdominal aorta. Infrarenal abdominal aorta penetrating atherosclerotic ulcer (series 6/image 116) at the origin of the IMA with approximately 5 mm neck. The area of ulceration measures 5 x 11 by 13 mm. The maximum diameter of the aorta at the site of ulceration is 26 mm. No associated intramural hematoma. This was not present on the 2019 CT abdomen and pelvis with contrast not well evaluated on CT abdomen pelvis without contrast 12/16/2022. Celiac: Patent without evidence of aneurysm, dissection, vasculitis or significant stenosis. SMA: Patent without evidence of aneurysm, dissection, vasculitis or significant stenosis. Renals: Single bilateral renal arteries without aneurysm or dissection. Mixed density plaque causes moderate right and mild left renal artery narrowing. IMA: Patent. Inflow: No aneurysm or dissection. Mixed density atherosclerotic plaque causes multifocal areas of mild to moderate narrowing in the common and external iliac stent severe narrowing of the internal iliac. Veins: No obvious venous abnormality within the limitations of this arterial phase study. Review of the MIP images confirms the above findings. NON-VASCULAR Hepatobiliary: Unremarkable liver, gallbladder, biliary tree. Pancreas: Unremarkable. Spleen: Unremarkable. Adrenals/Urinary Tract: Normal adrenal glands. No urinary calculi or hydronephrosis. Unremarkable bladder. Stomach/Bowel: Normal caliber large and small bowel. No bowel wall thickening. Normal appendix. Lymphatic: No adenopathy. Reproductive: Unremarkable. Other: No free intraperitoneal fluid or air. Musculoskeletal: No acute fracture or  destructive osseous lesion. Review of the MIP images confirms the above findings. IMPRESSION: 1. 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 The neck measures 5 mm and the area of ulceration measures 5 x 11 x 13 mm. The aorta measures 26 mm at the site of ulceration. No associated intramural hematoma. 2. Diffuse interlobular septal thickening and patchy ground-glass opacities in the right lung, favor edema versus infection. 3. Clustered tree-in-bud nodularity in the right middle lobe likely due to infectious bronchiolitis. This is unchanged from 12/16/2022. Continued attention on follow-up. 4. Unchanged 2.3 cm right upper lobe pulmonary nodule adjacent to  the fiducial markers. 5. Left pneumonectomy. 6. Cardiomegaly and small pericardial effusion. Electronically Signed   By: Placido Sou M.D.   On: 01/27/2023 03:21      Subjective: Patient seen and examined at bedside today.  Hemodynamically stable.  On 2 L of oxygen which is his baseline.  He was sitting in the chair.  He looks tired but is medically stable and is agreeable for going home today.  He did not have any new questions.  Discharge Exam: Vitals:   02/10/23 0816 02/10/23 1152  BP:    Pulse:    Resp:    Temp:    SpO2: 90% 92%   Vitals:   02/09/23 2035 02/10/23 0359 02/10/23 0816 02/10/23 1152  BP: 114/78 117/76    Pulse: 78 76    Resp: 17 18    Temp: 97.6 F (36.4 C) 97.9 F (36.6 C)    TempSrc: Oral Oral    SpO2: 93% 94% 90% 92%  Weight:      Height:        General: Pt is alert, awake, not in acute distress, weak, deconditioned Cardiovascular: RRR, S1/S2 +, no rubs, no gallops Respiratory: CTA bilaterally, no wheezing, no rhonchi Abdominal: Soft, NT, ND, bowel sounds + Extremities: no edema, no cyanosis    The results of significant diagnostics from this hospitalization (including imaging, microbiology, ancillary and laboratory) are listed below for reference.      Microbiology: Recent Results (from the past 240 hour(s))  Resp panel by RT-PCR (RSV, Flu A&B, Covid) Anterior Nasal Swab     Status: None   Collection Time: 02/04/23 11:51 PM   Specimen: Anterior Nasal Swab  Result Value Ref Range Status   SARS Coronavirus 2 by RT PCR NEGATIVE NEGATIVE Final    Comment: (NOTE) SARS-CoV-2 target nucleic acids are NOT DETECTED.  The SARS-CoV-2 RNA is generally detectable in upper respiratory specimens during the acute phase of infection. The lowest concentration of SARS-CoV-2 viral copies this assay can detect is 138 copies/mL. A negative result does not preclude SARS-Cov-2 infection and should not be used as the sole basis for treatment or other patient management decisions. A negative result may occur with  improper specimen collection/handling, submission of specimen other than nasopharyngeal swab, presence of viral mutation(s) within the areas targeted by this assay, and inadequate number of viral copies(<138 copies/mL). A negative result must be combined with clinical observations, patient history, and epidemiological information. The expected result is Negative.  Fact Sheet for Patients:  EntrepreneurPulse.com.au  Fact Sheet for Healthcare Providers:  IncredibleEmployment.be  This test is no t yet approved or cleared by the [place] FDA and  has been authorized for detection and/or diagnosis of SARS-CoV-2 by FDA under an Emergency Use Authorization (EUA). This EUA will remain  in effect (meaning this test can be used) for the duration of the COVID-19 declaration under Section 564(b)(1) of the Act, 21 U.S.C.section 360bbb-3(b)(1), unless the authorization is terminated  or revoked sooner.       Influenza A by PCR NEGATIVE NEGATIVE Final   Influenza B by PCR NEGATIVE NEGATIVE Final    Comment: (NOTE) The Xpert Xpress SARS-CoV-2/FLU/RSV plus assay is intended as an aid in the diagnosis of  influenza from Nasopharyngeal swab specimens and should not be used as a sole basis for treatment. Nasal washings and aspirates are unacceptable for Xpert Xpress SARS-CoV-2/FLU/RSV testing.  Fact Sheet for Patients: EntrepreneurPulse.com.au  Fact Sheet for Healthcare Providers: IncredibleEmployment.be  This test is not yet  approved or cleared by the [place] and has been authorized for detection and/or diagnosis of SARS-CoV-2 by FDA under an Emergency Use Authorization (EUA). This EUA will remain in effect (meaning this test can be used) for the duration of the COVID-19 declaration under Section 564(b)(1) of the Act, 21 U.S.C. section 360bbb-3(b)(1), unless the authorization is terminated or revoked.     Resp Syncytial Virus by PCR NEGATIVE NEGATIVE Final    Comment: (NOTE) Fact Sheet for Patients: EntrepreneurPulse.com.au  Fact Sheet for Healthcare Providers: IncredibleEmployment.be  This test is not yet approved or cleared by the [place] FDA and has been authorized for detection and/or diagnosis of SARS-CoV-2 by FDA under an Emergency Use Authorization (EUA). This EUA will remain in effect (meaning this test can be used) for the duration of the COVID-19 declaration under Section 564(b)(1) of the Act, 21 U.S.C. section 360bbb-3(b)(1), unless the authorization is terminated or revoked.  Performed at [facility], [place] [address], [place] [employer], [place] [address]   Blood culture (routine x 2)     Status: None   Collection Time: 02/05/23  4:01 AM   Specimen: BLOOD  Result Value Ref Range Status   Specimen Description   Final    BLOOD BLOOD RIGHT HAND Performed at [facility] [address], [place], [place] [address]    Special Requests   Final    BOTTLES DRAWN AEROBIC AND ANAEROBIC Blood Culture adequate volume Performed at [facility] [address] [place], [place] [address]    Culture   Final    NO GROWTH 5 DAYS Performed at [facility], [place] [address], [place], [place] [address]    Report Status 02/10/2023 FINAL  Final  Blood culture (routine x 2)     Status: None   Collection Time: 02/05/23  4:03 AM   Specimen: BLOOD  Result Value Ref Range Status   Specimen Description   Final    BLOOD BLOOD RIGHT FOREARM Performed at [facility] [address] [place], [place] [address]    Special Requests   Final    BOTTLES DRAWN AEROBIC AND ANAEROBIC Blood Culture adequate volume Performed at [facility] [address] [place], [place] [address]    Culture   Final    NO GROWTH 5 DAYS Performed at [facility], [place] [address], [place],  [address]    Report Status 02/10/2023 FINAL  Final     Labs: BNP (last 3 results) Recent Labs    12/27/22 0745 02/04/23 1944  BNP 752.6* 0000000*   Basic Metabolic Panel: Recent Labs  Lab 02/06/23 0513 02/07/23 0459 02/08/23 0536 02/09/23 0552 02/10/23 0555  NA 134* 130* 132* 133* 133*  K 4.2 4.9 5.0 5.0 5.1  CL 104 99 105 105 106  CO2 21* 19* 18* 18* 18*  GLUCOSE 116* 191* 143* 122* 97  BUN 24* 38* 41* 45* 38*  CREATININE 1.58* 2.37* 1.44* 1.33* 1.35*  CALCIUM 8.6* 8.2* 8.3* 8.6* 8.6*   Liver Function Tests: Recent Labs  Lab 02/06/23 0513  AST 24  ALT 22  ALKPHOS 66  BILITOT 0.6  PROT 6.9  ALBUMIN 3.3*   No results for input(s): "LIPASE", "AMYLASE" in the last 168 hours. No results for input(s): "AMMONIA" in the last 168 hours. CBC: Recent Labs  Lab 02/04/23 1944 02/06/23 0513 02/07/23 0459 02/08/23 0536  WBC 6.7 6.3 7.4 9.8  HGB 12.8* 11.7* 11.2*  10.8*  HCT 38.5* 35.5* 33.6* 32.2*  MCV 89.7 88.8 88.9 88.7  PLT 306 283 266 277   Cardiac Enzymes: No results for input(s): "CKTOTAL", "CKMB", "CKMBINDEX", "TROPONINI" in the last 168 hours. BNP: Invalid input(s):  "POCBNP" CBG: No results for input(s): "GLUCAP" in the last 168 hours. D-Dimer No results for input(s): "DDIMER" in the last 72 hours. Hgb A1c No results for input(s): "HGBA1C" in the last 72 hours. Lipid Profile No results for input(s): "CHOL", "HDL", "LDLCALC", "TRIG", "CHOLHDL", "LDLDIRECT" in the last 72 hours. Thyroid function studies No results for input(s): "TSH", "T4TOTAL", "T3FREE", "THYROIDAB" in the last 72 hours.  Invalid input(s): "FREET3" Anemia work up No results for input(s): "VITAMINB12", "FOLATE", "FERRITIN", "TIBC", "IRON", "RETICCTPCT" in the last 72 hours. Urinalysis    Component Value Date/Time   COLORURINE YELLOW 01/27/2023 0534   APPEARANCEUR CLEAR 01/27/2023 0534   LABSPEC 1.042 (H) 01/27/2023 0534   PHURINE 5.0 01/27/2023 0534   GLUCOSEU NEGATIVE 01/27/2023 0534   HGBUR NEGATIVE 01/27/2023 0534   BILIRUBINUR NEGATIVE 01/27/2023 0534   KETONESUR NEGATIVE 01/27/2023 0534   PROTEINUR NEGATIVE 01/27/2023 0534   UROBILINOGEN 0.2 07/04/2015 0600   NITRITE NEGATIVE 01/27/2023 0534   LEUKOCYTESUR NEGATIVE 01/27/2023 0534   Sepsis Labs Recent Labs  Lab 02/04/23 1944 02/06/23 0513 02/07/23 0459 02/08/23 0536  WBC 6.7 6.3 7.4 9.8   Microbiology Recent Results (from the past 240 hour(s))  Resp panel by RT-PCR (RSV, Flu A&B, Covid) Anterior Nasal Swab     Status: None   Collection Time: 02/04/23 11:51 PM   Specimen: Anterior Nasal Swab  Result Value Ref Range Status   SARS Coronavirus 2 by RT PCR NEGATIVE NEGATIVE Final    Comment: (NOTE) SARS-CoV-2 target nucleic acids are NOT DETECTED.  The SARS-CoV-2 RNA is generally detectable in upper respiratory specimens during the acute phase of infection. The lowest concentration of SARS-CoV-2 viral copies this assay can detect is 138 copies/mL. A negative result does not preclude SARS-Cov-2 infection and should not be used as the sole basis for treatment or other patient management decisions. A negative  result may occur with  improper specimen collection/handling, submission of specimen other than nasopharyngeal swab, presence of viral mutation(s) within the areas targeted by this assay, and inadequate number of viral copies(<138 copies/mL). A negative result must be combined with clinical observations, patient history, and epidemiological information. The expected result is Negative.  Fact Sheet for Patients:  EntrepreneurPulse.com.au  Fact Sheet for Healthcare Providers:  IncredibleEmployment.be  This test is no t yet approved or cleared by the [place] FDA and  has been authorized for detection and/or diagnosis of SARS-CoV-2 by FDA under an Emergency Use Authorization (EUA). This EUA will remain  in effect (meaning this test can be used) for the duration of the COVID-19 declaration under Section 564(b)(1) of the Act, 21 U.S.C.section 360bbb-3(b)(1), unless the authorization is terminated  or revoked sooner.       Influenza A by PCR NEGATIVE NEGATIVE Final   Influenza B by PCR NEGATIVE NEGATIVE Final    Comment: (NOTE) The Xpert Xpress SARS-CoV-2/FLU/RSV plus assay is intended as an aid in the diagnosis of influenza from Nasopharyngeal swab specimens and should not be used as a sole basis for treatment. Nasal washings and aspirates are unacceptable for Xpert Xpress SARS-CoV-2/FLU/RSV testing.  Fact Sheet for Patients: EntrepreneurPulse.com.au  Fact Sheet for Healthcare Providers: IncredibleEmployment.be  This test is not yet approved or cleared by the [place] and has been authorized for  detection and/or diagnosis of SARS-CoV-2 by FDA under an Emergency Use Authorization (EUA). This EUA will remain in effect (meaning this test can be used) for the duration of the COVID-19 declaration under Section 564(b)(1) of the Act, 21 U.S.C. section 360bbb-3(b)(1), unless the authorization is  terminated or revoked.     Resp Syncytial Virus by PCR NEGATIVE NEGATIVE Final    Comment: (NOTE) Fact Sheet for Patients: EntrepreneurPulse.com.au  Fact Sheet for Healthcare Providers: IncredibleEmployment.be  This test is not yet approved or cleared by the [place] FDA and has been authorized for detection and/or diagnosis of SARS-CoV-2 by FDA under an Emergency Use Authorization (EUA). This EUA will remain in effect (meaning this test can be used) for the duration of the COVID-19 declaration under Section 564(b)(1) of the Act, 21 U.S.C. section 360bbb-3(b)(1), unless the authorization is terminated or revoked.  Performed at [facility], [place] [address], [place], [place] [address]   Blood culture (routine x 2)     Status: None   Collection Time: 02/05/23  4:01 AM   Specimen: BLOOD  Result Value Ref Range Status   Specimen Description   Final    BLOOD BLOOD RIGHT HAND Performed at [facility] [address], [place], [place] [address]    Special Requests   Final    BOTTLES DRAWN AEROBIC AND ANAEROBIC Blood Culture adequate volume Performed at [facility] [address], [place], [place] [address]    Culture   Final    NO GROWTH 5 DAYS Performed at [facility], [place] [address], [place], [place] [address]    Report Status 02/10/2023 FINAL  Final  Blood culture (routine x 2)     Status: None   Collection Time: 02/05/23  4:03 AM   Specimen: BLOOD  Result Value Ref Range Status   Specimen Description   Final    BLOOD BLOOD RIGHT FOREARM Performed at [facility] [address] [place], [place] [address]    Special Requests   Final    BOTTLES DRAWN AEROBIC AND ANAEROBIC Blood Culture adequate volume Performed at [facility] [address], [place], [place] [address]    Culture   Final    NO GROWTH 5 DAYS Performed at  [facility], [place] [address], [place], [place] [address]    Report Status 02/10/2023 FINAL  Final    Please note: You were cared for by a hospitalist during your hospital stay. Once you are discharged, your primary care physician will handle any further medical issues. Please note that NO REFILLS for any discharge medications will be authorized once you are discharged, as it is imperative that you return to your primary care physician (or establish a relationship with a primary care physician if you do not have one) for your post hospital discharge needs so that they can reassess your need for medications and monitor your lab values.    Time coordinating discharge: 40 minutes  SIGNED:   Shelly Coss, MD  [facility] 02/10/2023, 12:32 PM Pager [phone]  If 7PM-7AM, please contact night-coverage [email or web address] Password [email or web address]

## 2023-02-11 ENCOUNTER — Telehealth: Payer: Self-pay

## 2023-02-11 ENCOUNTER — Other Ambulatory Visit (HOSPITAL_COMMUNITY): Payer: Self-pay

## 2023-02-11 NOTE — Telephone Encounter (Signed)
(  10:07 am) PC SW completed a follow-up call to patient to discuss referral for palliative care. SW left a message for patient requesting a call back.

## 2023-02-15 ENCOUNTER — Other Ambulatory Visit: Payer: Self-pay

## 2023-02-16 ENCOUNTER — Inpatient Hospital Stay: Payer: 59 | Attending: Internal Medicine | Admitting: Internal Medicine

## 2023-02-16 ENCOUNTER — Inpatient Hospital Stay: Payer: 59

## 2023-02-16 ENCOUNTER — Other Ambulatory Visit: Payer: 59

## 2023-02-16 ENCOUNTER — Encounter: Payer: Self-pay | Admitting: Internal Medicine

## 2023-02-16 ENCOUNTER — Encounter: Payer: Self-pay | Admitting: Medical Oncology

## 2023-02-16 ENCOUNTER — Telehealth: Payer: Self-pay

## 2023-02-16 ENCOUNTER — Other Ambulatory Visit: Payer: Self-pay

## 2023-02-16 VITALS — BP 102/66 | HR 91 | Temp 98.2°F | Resp 16 | Ht 60.0 in | Wt 147.1 lb

## 2023-02-16 DIAGNOSIS — C3490 Malignant neoplasm of unspecified part of unspecified bronchus or lung: Secondary | ICD-10-CM

## 2023-02-16 DIAGNOSIS — Z95828 Presence of other vascular implants and grafts: Secondary | ICD-10-CM

## 2023-02-16 DIAGNOSIS — J123 Human metapneumovirus pneumonia: Secondary | ICD-10-CM | POA: Diagnosis not present

## 2023-02-16 DIAGNOSIS — J189 Pneumonia, unspecified organism: Secondary | ICD-10-CM | POA: Diagnosis not present

## 2023-02-16 LAB — CMP (CANCER CENTER ONLY)
ALT: 62 U/L — ABNORMAL HIGH (ref 0–44)
AST: 60 U/L — ABNORMAL HIGH (ref 15–41)
Albumin: 3.9 g/dL (ref 3.5–5.0)
Alkaline Phosphatase: 120 U/L (ref 38–126)
Anion gap: 8 (ref 5–15)
BUN: 35 mg/dL — ABNORMAL HIGH (ref 8–23)
CO2: 27 mmol/L (ref 22–32)
Calcium: 8.8 mg/dL — ABNORMAL LOW (ref 8.9–10.3)
Chloride: 102 mmol/L (ref 98–111)
Creatinine: 1.31 mg/dL — ABNORMAL HIGH (ref 0.61–1.24)
GFR, Estimated: >60 mL/min (ref >60)
Glucose, Bld: 98 mg/dL (ref 70–99)
Potassium: 3.7 mmol/L (ref 3.5–5.1)
Sodium: 137 mmol/L (ref 135–145)
Total Bilirubin: 0.6 mg/dL (ref 0.3–1.2)
Total Protein: 7.1 g/dL (ref 6.5–8.1)

## 2023-02-16 LAB — CBC WITH DIFFERENTIAL (CANCER CENTER ONLY)
Abs Immature Granulocytes: 0.07 10*3/uL (ref 0.00–0.07)
Basophils Absolute: 0 10*3/uL (ref 0.0–0.1)
Basophils Relative: 0 %
Eosinophils Absolute: 0 10*3/uL (ref 0.0–0.5)
Eosinophils Relative: 0 %
HCT: 36.6 % — ABNORMAL LOW (ref 39.0–52.0)
Hemoglobin: 12.6 g/dL — ABNORMAL LOW (ref 13.0–17.0)
Immature Granulocytes: 1 %
Lymphocytes Relative: 9 %
Lymphs Abs: 0.8 10*3/uL (ref 0.7–4.0)
MCH: 30.1 pg (ref 26.0–34.0)
MCHC: 34.4 g/dL (ref 30.0–36.0)
MCV: 87.4 fL (ref 80.0–100.0)
Monocytes Absolute: 0.9 10*3/uL (ref 0.1–1.0)
Monocytes Relative: 10 %
Neutro Abs: 7.1 10*3/uL (ref 1.7–7.7)
Neutrophils Relative %: 80 %
Platelet Count: 265 10*3/uL (ref 150–400)
RBC: 4.19 MIL/uL — ABNORMAL LOW (ref 4.22–5.81)
RDW: 18.3 % — ABNORMAL HIGH (ref 11.5–15.5)
WBC Count: 8.9 10*3/uL (ref 4.0–10.5)
nRBC: 0 % (ref 0.0–0.2)

## 2023-02-16 MED ORDER — SODIUM CHLORIDE 0.9% FLUSH
10.0000 mL | INTRAVENOUS | Status: DC | PRN
  Administered 2023-02-16: 10 mL

## 2023-02-16 MED ORDER — SODIUM CHLORIDE 0.9% FLUSH
10.0000 mL | Freq: Once | INTRAVENOUS | Status: AC
  Administered 2023-02-16: 10 mL

## 2023-02-16 MED ORDER — HEPARIN SOD (PORK) LOCK FLUSH 100 UNIT/ML IV SOLN
500.0000 [IU] | Freq: Once | INTRAVENOUS | Status: AC | PRN
  Administered 2023-02-16: 500 [IU]

## 2023-02-16 MED ORDER — SODIUM CHLORIDE 0.9 % IV SOLN: Freq: Once | INTRAVENOUS | Status: AC

## 2023-02-16 MED ORDER — SODIUM CHLORIDE 0.9 % IV SOLN
200.0000 mg | Freq: Once | INTRAVENOUS | Status: AC
  Administered 2023-02-16: 200 mg via INTRAVENOUS
  Filled 2023-02-16: qty 200

## 2023-02-16 NOTE — Progress Notes (Signed)
[place] [phone]   [phone]  OFFICE PROGRESS NOTE  System, Provider Not In No address on file  DIAGNOSIS: 1)  Stage IV (T4, N3, M1 C) non-small cell lung cancer and March 2023 presented with right middle lobe and right upper lobe pulmonary nodules in addition to mediastinal and supraclavicular lymphadenopathy as well as suspicious splenic metastasis.  2) The patient has a history for stage IIIa non-small cell lung cancer diagnosed in 2004 status post neoadjuvant concurrent chemoradiation followed by consolidation chemotherapy followed by left pneumonectomy and has been in observation for almost 19 years before he has the new diagnosis of lung cancer in 2023.   PRIOR THERAPY: Status post 4 cycles of systemic chemotherapy with carboplatin, paclitaxel and Keytruda at [facility] in [place].  He also received 3 cycles of maintenance treatment with Keytruda at the same facility.  CURRENT THERAPY: Maintenance treatment with Keytruda 200 Mg IV every 3 weeks status post 3 cycles.  INTERVAL HISTORY: Ronald [name] 67 y.o. male returns to the clinic today for follow-up visit.  The patient is feeling fine today with no concerning complaints except for the baseline shortness of breath and he is currently on home oxygen.  He denied having any current chest pain, cough or hemoptysis.  He has no nausea, vomiting, diarrhea or constipation.  He has no headache or visual changes.  He denied having any significant weight loss or night sweats.  He has no fever or chills.  He is here today for evaluation before starting cycle #6 of his treatment.   MEDICAL HISTORY: Past Medical History:  Diagnosis Date   CHF (congestive heart failure)    COPD (chronic obstructive pulmonary disease)    Coronary artery disease    Hypertension    lung ca dx'd 2005   chemo/xrt comp 2005, lung ca   Myocardial infarction    Shortness of breath      ALLERGIES:  is allergic to penicillins.  MEDICATIONS:  Current Outpatient Medications  Medication Sig Dispense Refill   acetaminophen (TYLENOL) 500 MG tablet Take 2 tablets (1,000 mg total) by mouth every 8 (eight) hours as needed. 60 tablet 0   albuterol (PROVENTIL HFA;VENTOLIN HFA) 108 (90 BASE) MCG/ACT inhaler Inhale 2 puffs into the lungs every 4 (four) hours as needed for wheezing or shortness of breath. 1 Inhaler 0   aspirin EC 81 MG tablet Take 81 mg by mouth daily. Swallow whole.     gabapentin (NEURONTIN) 300 MG capsule Take 300 mg by mouth 3 (three) times daily.     levothyroxine (SYNTHROID) 75 MCG tablet Take 75 mcg by mouth daily. (Patient not taking: Reported on 02/05/2023)     lidocaine (LIDODERM) 5 % Place 1 patch onto the skin daily. Remove & Discard patch within 12 hours or as directed by MD 30 patch 0   lidocaine-prilocaine (EMLA) cream Apply 1 Application topically as needed (To Port-a-Cath). 30 g 2   LORazepam (ATIVAN) 1 MG tablet Take 1 tablet (1 mg total) by mouth 2 (two) times daily as needed for anxiety. 5 tablet 0   midodrine (PROAMATINE) 5 MG tablet Take 1 tablet (5 mg total) by mouth 3 (three) times daily with meals. 90 tablet 0   montelukast (SINGULAIR) 10 MG tablet Take 1 tablet (10 mg total) by mouth at bedtime.     nitroGLYCERIN (NITROSTAT) 0.4 MG SL tablet Place 1 tablet (0.4 mg total) under the tongue every  5 (five) minutes x 3 doses as needed for chest pain. (Patient not taking: Reported on 02/05/2023) 25 tablet 12   Oxycodone HCl 10 MG TABS Take 1 tablet (10 mg total) by mouth every 6 (six) hours as needed for moderate pain or severe pain (shortness of breath). 30 tablet 0   polyethylene glycol (MIRALAX / GLYCOLAX) 17 g packet Take 17 g by mouth daily. 14 each 0   predniSONE (DELTASONE) 10 MG tablet Take 1 tablet (10 mg total) by mouth daily with breakfast for 3 days. 3 tablet 0   rosuvastatin (CRESTOR) 5 MG tablet Take 5 mg by mouth daily.      senna-docusate (SENOKOT-S) 8.6-50 MG tablet Take 2 tablets by mouth 2 (two) times daily. 60 tablet 0   sodium bicarbonate 650 MG tablet Take 1 tablet (650 mg total) by mouth 2 (two) times daily. 60 tablet 0   TRELEGY ELLIPTA 200-62.5-25 MCG/ACT AEPB Take 1 puff by mouth daily.     zolpidem (AMBIEN) 10 MG tablet Take 10 mg by mouth at bedtime.     No current facility-administered medications for this visit.    SURGICAL HISTORY:  Past Surgical History:  Procedure Laterality Date   CARDIAC CATHETERIZATION N/A 10/13/2015   Procedure: Left Heart Cath and Coronary Angiography;  Surgeon: [name], MD;  Location: [facility];  Service: Cardiovascular;  Laterality: N/A;   CORONARY ANGIOPLASTY WITH STENT PLACEMENT     LEFT HEART CATH AND CORONARY ANGIOGRAPHY N/A 01/03/2018   Procedure: LEFT HEART CATH AND CORONARY ANGIOGRAPHY;  Surgeon: [name], MD;  Location: [facility];  Service: Cardiovascular;  Laterality: N/A;   LEFT HEART CATHETERIZATION WITH CORONARY ANGIOGRAM N/A 04/07/2012   Procedure: LEFT HEART CATHETERIZATION WITH CORONARY ANGIOGRAM;  Surgeon: [name], MD;  Location: [facility];  Service: Cardiovascular;  Laterality: N/A;   PNEUMONECTOMY  2005   left   vocal cord surgery  2005    REVIEW OF SYSTEMS:  A comprehensive review of systems was negative except for: Constitutional: positive for fatigue Respiratory: positive for dyspnea on exertion   PHYSICAL EXAMINATION: General appearance: alert, cooperative, fatigued, and no distress Head: Normocephalic, without obvious abnormality, atraumatic Neck: no adenopathy, no JVD, supple, symmetrical, trachea midline, and thyroid not enlarged, symmetric, no tenderness/mass/nodules Lymph nodes: Cervical, supraclavicular, and axillary nodes normal. Resp: diminished breath sounds LLL and LUL and dullness to percussion LLL and LUL Back: symmetric, no curvature. ROM normal. No CVA tenderness. Cardio: regular rate and  rhythm, S1, S2 normal, no murmur, click, rub or gallop GI: soft, non-tender; bowel sounds normal; no masses,  no organomegaly Extremities: extremities normal, atraumatic, no cyanosis or edema  ECOG PERFORMANCE STATUS: 1 - Symptomatic but completely ambulatory  Blood pressure 102/66, pulse 91, temperature 98.2 F (36.8 C), temperature source Oral, resp. rate 16, height 5' (1.524 m), weight 147 lb 1.6 oz (66.7 kg), SpO2 97 %.  LABORATORY DATA: Lab Results  Component Value Date   WBC 8.9 02/16/2023   HGB 12.6 (L) 02/16/2023   HCT 36.6 (L) 02/16/2023   MCV 87.4 02/16/2023   PLT 265 02/16/2023      Chemistry      Component Value Date/Time   NA 133 (L) 02/10/2023 0555   NA 133 (L) 12/14/2012 0942   K 5.1 02/10/2023 0555   K 4.5 12/14/2012 0942   CL 106 02/10/2023 0555   CL 101 12/14/2012 0942   CO2 18 (L) 02/10/2023 0555   CO2 24 12/14/2012  0942   BUN 38 (H) 02/10/2023 0555   BUN 21.8 12/14/2012 0942   CREATININE 1.35 (H) 02/10/2023 0555   CREATININE 1.70 (H) 01/25/2023 1100   CREATININE 1.0 12/14/2012 0942      Component Value Date/Time   CALCIUM 8.6 (L) 02/10/2023 0555   CALCIUM 9.0 12/14/2012 0942   ALKPHOS 66 02/06/2023 0513   ALKPHOS 108 12/14/2012 0942   AST 24 02/06/2023 0513   AST 30 01/25/2023 1100   AST 31 12/14/2012 0942   ALT 22 02/06/2023 0513   ALT 19 01/25/2023 1100   ALT 42 12/14/2012 0942   BILITOT 0.6 02/06/2023 0513   BILITOT 0.4 01/25/2023 1100   BILITOT 0.63 12/14/2012 0942       RADIOGRAPHIC STUDIES: DG CHEST PORT 1 VIEW  Result Date: 02/08/2023 CLINICAL DATA:  Short of breath EXAM: PORTABLE CHEST 1 VIEW COMPARISON:  02/04/2023 FINDINGS: Single frontal view of the chest demonstrates stable postsurgical changes from left pneumonectomy. Stable left chest wall port, tip overlying superior vena cava. Increased interstitial prominence throughout the right lung is unchanged, with stable patchy consolidation at the right lung base and fiduciary  markers again noted. No right effusion or pneumothorax. Stable postsurgical changes from left thoracotomy. IMPRESSION: 1. Chronic interstitial scarring throughout the right lung. No superimposed pneumonia. 2. Continued patchy right basilar consolidation and underlying fiduciary markers, compatible with patient's known history of spiculated right middle lobe nodule seen on recent CT. Electronically Signed   By: Randa Ngo M.D.   On: 02/08/2023 10:06   US RENAL  Result Date: 02/07/2023 CLINICAL DATA:  Renal dysfunction EXAM: RENAL / URINARY TRACT ULTRASOUND COMPLETE COMPARISON:  Abdomen sonogram done on 03/06/2015, CT done on 01/27/2023 FINDINGS: Right Kidney: Renal measurements: 10.1 x 5.5 x 5.5 cm = volume: 147.4 mL. There is no hydronephrosis. There is no focal cortical thinning. There is increased cortical echogenicity. Left Kidney: Renal measurements: 9.6 x 4.7 x 4.6 cm = volume: 108.1 mL. There is increased cortical echogenicity. There is no hydronephrosis or perinephric fluid collection. Bladder: Appears normal for degree of bladder distention. Other: None. IMPRESSION: There is no hydronephrosis. There is increased cortical echogenicity in both kidneys which may be a technical artifact or suggestive medical renal disease. Electronically Signed   By: Elmer Picker M.D.   On: 02/07/2023 15:08   ECHOCARDIOGRAM COMPLETE  Result Date: 02/06/2023    ECHOCARDIOGRAM REPORT   Patient Name:   DARSHAWN STEWART Date of Exam: 02/06/2023 Medical Rec #:  [id number]         Height:       60.0 in Accession #:    [id number]        Weight:       151.7 lb Date of Birth:  September 15, 1956         BSA:          1.660 m Patient Age:    50 years          BP:           105/77 mmHg Patient Gender: M                 HR:           86 bpm. Exam Location:  Inpatient Procedure: 2D Echo, Color Doppler and Cardiac Doppler Indications:    CAD Native Vessel I25.10, Congestive Heart Failure I50.9  History:        Patient has prior  history of Echocardiogram examinations. CAD  and Previous Myocardial Infarction, COPD; Risk                 Factors:Hypertension.  Sonographer:    Phineas Douglas Referring Phys: [id number] [place]  1. Left ventricular ejection fraction, by estimation, is 45 to 50%. The left ventricle has mildly decreased function. The left ventricle demonstrates global hypokinesis. The left ventricular internal cavity size was mildly dilated. Left ventricular diastolic parameters are consistent with Grade II diastolic dysfunction (pseudonormalization). Elevated left ventricular end-diastolic pressure.  2. Right ventricular systolic function is moderately reduced. The right ventricular size is moderately enlarged.  3. Left atrial size was moderately dilated.  4. Right atrial size was moderately dilated.  5. The mitral valve is abnormal. Moderate mitral valve regurgitation. No evidence of mitral stenosis.  6. Tricuspid valve regurgitation is moderate.  7. The aortic valve is tricuspid. There is mild calcification of the aortic valve. There is mild thickening of the aortic valve. Aortic valve regurgitation is mild. Aortic valve sclerosis is present, with no evidence of aortic valve stenosis.  8. The inferior vena cava is normal in size with greater than 50% respiratory variability, suggesting right atrial pressure of 3 mmHg. FINDINGS  Left Ventricle: Left ventricular ejection fraction, by estimation, is 45 to 50%. The left ventricle has mildly decreased function. The left ventricle demonstrates global hypokinesis. The left ventricular internal cavity size was mildly dilated. There is  no left ventricular hypertrophy. Left ventricular diastolic parameters are consistent with Grade II diastolic dysfunction (pseudonormalization). Elevated left ventricular end-diastolic pressure. Right Ventricle: The right ventricular size is moderately enlarged. No increase in right ventricular wall thickness. Right  ventricular systolic function is moderately reduced. Left Atrium: Left atrial size was moderately dilated. Right Atrium: Right atrial size was moderately dilated. Pericardium: There is no evidence of pericardial effusion. Mitral Valve: Restricted posterior leaflet motion and calcified sub chordal apparatus. The mitral valve is abnormal. There is mild thickening of the mitral valve leaflet(s). There is mild calcification of the mitral valve leaflet(s). Mild mitral annular calcification. Moderate mitral valve regurgitation. No evidence of mitral valve stenosis. Tricuspid Valve: The tricuspid valve is normal in structure. Tricuspid valve regurgitation is moderate . No evidence of tricuspid stenosis. Aortic Valve: The aortic valve is tricuspid. There is mild calcification of the aortic valve. There is mild thickening of the aortic valve. Aortic valve regurgitation is mild. Aortic regurgitation PHT measures 512 msec. Aortic valve sclerosis is present,  with no evidence of aortic valve stenosis. Pulmonic Valve: The pulmonic valve was normal in structure. Pulmonic valve regurgitation is not visualized. No evidence of pulmonic stenosis. Aorta: The aortic root is normal in size and structure. Venous: The inferior vena cava is normal in size with greater than 50% respiratory variability, suggesting right atrial pressure of 3 mmHg. IAS/Shunts: No atrial level shunt detected by color flow Doppler.  LEFT VENTRICLE PLAX 2D LVIDd:         4.50 cm      Diastology LVIDs:         3.70 cm      LV e' medial:    3.97 cm/s LV PW:         1.00 cm      LV E/e' medial:  30.0 LV IVS:        1.00 cm      LV e' lateral:   3.73 cm/s LVOT diam:     2.10 cm      LV E/e' lateral: 31.9 LV  SV:         48 LV SV Index:   29 LVOT Area:     3.46 cm  LV Volumes (MOD) LV vol d, MOD A2C: 137.0 ml LV vol d, MOD A4C: 93.8 ml LV vol s, MOD A2C: 70.8 ml LV vol s, MOD A4C: 51.4 ml LV SV MOD A2C:     66.2 ml LV SV MOD A4C:     93.8 ml LV SV MOD BP:      52.2 ml  RIGHT VENTRICLE RV Basal diam:  5.40 cm RV S prime:     10.90 cm/s TAPSE (M-mode): 2.0 cm LEFT ATRIUM             Index        RIGHT ATRIUM           Index LA diam:        4.10 cm 2.47 cm/m   RA Area:     21.30 cm LA Vol (A2C):   69.2 ml 41.70 ml/m  RA Volume:   67.90 ml  40.91 ml/m LA Vol (A4C):   51.3 ml 30.91 ml/m LA Biplane Vol: 61.2 ml 36.88 ml/m  AORTIC VALVE LVOT Vmax:   77.20 cm/s LVOT Vmean:  51.800 cm/s LVOT VTI:    0.139 m AI PHT:      512 msec  AORTA Ao Root diam: 3.20 cm Ao Asc diam:  3.20 cm MITRAL VALVE                  TRICUSPID VALVE MV Area (PHT): 4.83 cm       TR Peak grad:   49.8 mmHg MV Decel Time: 157 msec       TR Vmax:        353.00 cm/s MR Peak grad:    88.0 mmHg MR Mean grad:    56.0 mmHg    SHUNTS MR Vmax:         469.00 cm/s  Systemic VTI:  0.14 m MR Vmean:        348.0 cm/s   Systemic Diam: 2.10 cm MR PISA:         2.26 cm MR PISA Eff ROA: 15 mm MR PISA Radius:  0.60 cm MV E velocity: 119.00 cm/s MV A velocity: 92.20 cm/s MV E/A ratio:  1.29 Jenkins Rouge MD Electronically signed by Jenkins Rouge MD Signature Date/Time: 02/06/2023/4:52:00 PM    Final    DG Knee 1-2 Views Left  Result Date: 02/06/2023 CLINICAL DATA:  [id number] Sw[place] [id number] EXAM: LEFT KNEE - 1-2 VIEW COMPARISON:  None Available. FINDINGS: No acute fracture or dislocation. Mild degenerative changes of the medial compartment with joint space narrowing. No area of erosion or osseous destruction. No unexpected radiopaque foreign body. Vascular calcifications. No significant joint effusion. IMPRESSION: 1. No acute fracture or dislocation. 2. Mild degenerative changes of the medial compartment. Electronically Signed   By: Valentino Saxon M.D.   On: 02/06/2023 11:58   CT Angio Chest PE W and/or Wo Contrast  Result Date: 02/04/2023 CLINICAL DATA:  Chest pain shortness of breath EXAM: CT ANGIOGRAPHY CHEST WITH CONTRAST TECHNIQUE: Multidetector CT imaging of the chest was performed using the standard protocol during  bolus administration of intravenous contrast. Multiplanar CT image reconstructions and MIPs were obtained to evaluate the vascular anatomy. RADIATION DOSE REDUCTION: This exam was performed according to the departmental dose-optimization program which includes automated exposure control, adjustment of the mA and/or kV according to patient size and/or use  of iterative reconstruction technique. CONTRAST:  53mL OMNIPAQUE IOHEXOL 350 MG/ML SOLN COMPARISON:  Chest x-ray 02/04/2023, CT chest 01/27/2023, 12/16/2022 FINDINGS: Cardiovascular: Satisfactory opacification of the pulmonary arteries to the segmental level. No evidence of pulmonary embolism on the right. Cardiomegaly. Moderate aortic atherosclerosis without aneurysm. Coronary stent and calcifications. No significant pericardial effusion. Mediastinum/Nodes: Patent trachea. No suspicious thyroid nodule. Shift of mediastinal contents to the left due to history of left pneumonectomy. Esophagus within normal limits. Enlarged right hilar nodes measuring up to 14 mm not significantly changed. Esophagus within normal limits. Lungs/Pleura: Left pneumonectomy with chronic thick-walled fluid in the left thoracic cavity. Emphysema. Diffuse reticular densities throughout the right lung. Spiculated right middle lobe mass measuring 1.6 by 2 cm, previously 2 x 1.8 cm with adjacent fiducial markers. Increased hazy pulmonary density in the right lower lobe compared to prior. Upper Abdomen: No acute finding. Reflux of contrast into the hepatic veins consistent with elevated right heart pressure. Musculoskeletal: Postsurgical changes of the left ribs. No acute or suspicious osseous abnormality. Review of the MIP images confirms the above findings. IMPRESSION: 1. Status post left pneumonectomy. No evidence for acute right pulmonary embolus. 2. Cardiomegaly 3. Emphysema with diffuse interstitial densities throughout the right thorax, similar to previous exams. Slight increased hazy  pulmonary density in the right lower lobe could be due to superimposed infectious or inflammatory process. 4. Grossly stable 2 cm right middle lobe spiculated nodule with adjacent fiducial markers Aortic Atherosclerosis (ICD10-I70.0) and Emphysema (ICD10-J43.9). Electronically Signed   By: Donavan Foil M.D.   On: 02/04/2023 23:30   DG Chest Portable 1 View  Result Date: 02/04/2023 CLINICAL DATA:  Chest pain, shortness of breath EXAM: PORTABLE CHEST 1 VIEW COMPARISON:  Previous studies including the chest radiograph done on 12/27/2022 and CT done on 01/27/2023 FINDINGS: There is previous left pneumonectomy. There are few metallic clips in right lower lung field, possibly fiducial markers from previous biopsy increased interstitial markings are seen in right lower lung field. There is interval improvement in the aeration in right upper lung field since 01/27/2023. There is no focal consolidation. Right lateral CP angle is clear. There is no pneumothorax. Tip of left IJ central venous catheter is seen in superior vena cava. IMPRESSION: Increased interstitial markings in right lower lung field may suggest scarring or interstitial pneumonia. There is no focal consolidation. There is no right pleural effusion or pneumothorax. Previous left pneumonectomy. Electronically Signed   By: Elmer Picker M.D.   On: 02/04/2023 20:05   CT Angio Chest/Abd/Pel for Dissection W and/or Wo Contrast  Result Date: 01/27/2023 CLINICAL DATA:  Sudden onset periumbilical pain. Ongoing chemo for lung cancer. History of left pneumonectomy in 2005. Aortic aneurysm suspected. EXAM: CT ANGIOGRAPHY CHEST, ABDOMEN AND PELVIS TECHNIQUE: Non-contrast CT of the chest was initially obtained. Multidetector CT imaging through the chest, abdomen and pelvis was performed using the standard protocol during bolus administration of intravenous contrast. Multiplanar reconstructed images and MIPs were obtained and reviewed to evaluate the vascular  anatomy. RADIATION DOSE REDUCTION: This exam was performed according to the departmental dose-optimization program which includes automated exposure control, adjustment of the mA and/or kV according to patient size and/or use of iterative reconstruction technique. CONTRAST:  10mL OMNIPAQUE IOHEXOL 350 MG/ML SOLN COMPARISON:  CT chest abdomen and pelvis 12/16/2022 FINDINGS: CTA CHEST FINDINGS Cardiovascular: Preferential opacification of the thoracic aorta. No evidence of thoracic aortic aneurysm or dissection. Cardiomegaly. Dilated right ventricle with reflux of contrast into the hepatic veins compatible with elevated  right heart pressures. Coronary stenting and coronary artery calcification. Mixed density aortic atherosclerotic calcification without significant narrowing. Small pericardial effusion. Left chest wall Port-A-Cath tip in the low SVC. Mediastinum/Nodes: Leftward deviation of the mediastinum secondary to left pneumonectomy. Unremarkable esophagus. 16 mm right hilar node (6/39) is unchanged from 10/14/2013. Lungs/Pleura: Left pneumonectomy. Diffuse interlobular septal thickening and patchy ground-glass opacities in the right lung. Fiducial markers noted next to an unchanged irregularly marginated 2.3 cm pulmonary nodule. There are adjacent clustered micro nodules in the right middle lobe. No pneumothorax or pleural effusion. Musculoskeletal: Left sixth rib osteotomy. No acute fracture. No destructive osseous lesion. Review of the MIP images confirms the above findings. CTA ABDOMEN AND PELVIS FINDINGS VASCULAR Aorta: No aneurysm or dissection. Advanced mixed density atherosclerotic plaque throughout the abdominal aorta. Infrarenal abdominal aorta penetrating atherosclerotic ulcer (series 6/image 116) at the origin of the IMA with approximately 5 mm neck. The area of ulceration measures 5 x 11 by 13 mm. The maximum diameter of the aorta at the site of ulceration is 26 mm. No associated intramural hematoma.  This was not present on the 2019 CT abdomen and pelvis with contrast not well evaluated on CT abdomen pelvis without contrast 12/16/2022. Celiac: Patent without evidence of aneurysm, dissection, vasculitis or significant stenosis. SMA: Patent without evidence of aneurysm, dissection, vasculitis or significant stenosis. Renals: Single bilateral renal arteries without aneurysm or dissection. Mixed density plaque causes moderate right and mild left renal artery narrowing. IMA: Patent. Inflow: No aneurysm or dissection. Mixed density atherosclerotic plaque causes multifocal areas of mild to moderate narrowing in the common and external iliac stent severe narrowing of the internal iliac. Veins: No obvious venous abnormality within the limitations of this arterial phase study. Review of the MIP images confirms the above findings. NON-VASCULAR Hepatobiliary: Unremarkable liver, gallbladder, biliary tree. Pancreas: Unremarkable. Spleen: Unremarkable. Adrenals/Urinary Tract: Normal adrenal glands. No urinary calculi or hydronephrosis. Unremarkable bladder. Stomach/Bowel: Normal caliber large and small bowel. No bowel wall thickening. Normal appendix. Lymphatic: No adenopathy. Reproductive: Unremarkable. Other: No free intraperitoneal fluid or air. Musculoskeletal: No acute fracture or destructive osseous lesion. Review of the MIP images confirms the above findings. IMPRESSION: 1. 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 The neck measures 5 mm and the area of ulceration measures 5 x 11 x 13 mm. The aorta measures 26 mm at the site of ulceration. No associated intramural hematoma. 2. Diffuse interlobular septal thickening and patchy ground-glass opacities in the right lung, favor edema versus infection. 3. Clustered tree-in-bud nodularity in the right middle lobe likely due to infectious bronchiolitis. This is unchanged from 12/16/2022. Continued attention on follow-up. 4.  Unchanged 2.3 cm right upper lobe pulmonary nodule adjacent to the fiducial markers. 5. Left pneumonectomy. 6. Cardiomegaly and small pericardial effusion. Electronically Signed   By: Placido Sou M.D.   On: 01/27/2023 03:21    ASSESSMENT AND PLAN: This is a very pleasant 67 years old African-American male with stage IV (T4, N3, M1 C) non-small cell lung cancer and March 2023 presented with right middle lobe and right upper lobe pulmonary nodules in addition to mediastinal and supraclavicular lymphadenopathy as well as suspicious splenic metastasis. The patient has a history for stage IIIa non-small cell lung cancer diagnosed in 2004 status post neoadjuvant concurrent chemoradiation followed by consolidation chemotherapy followed by left pneumonectomy and has been in observation for almost 19 years before he has the new diagnosis of lung cancer in 2023.  The  patient started systemic chemotherapy with carboplatin, paclitaxel and Keytruda for 4 cycles before switching to single agent Keytruda.  He is status post 6 cycles of the maintenance therapy and has been tolerating it fairly well. The patient has been tolerating this treatment well. I recommended for him to proceed with cycle #7 of his treatment today as planned. He will come back for follow-up visit in 3 weeks for evaluation before the next cycle of his treatment. He was advised to call immediately if he has any other concerning symptoms in the interval. The patient voices understanding of current disease status and treatment options and is in agreement with the current care plan.  All questions were answered. The patient knows to call the clinic with any problems, questions or concerns. We can certainly see the patient much sooner if necessary.  The total time spent in the appointment was 20 minutes.  Disclaimer: This note was dictated with voice recognition software. Similar sounding words can inadvertently be transcribed and may not be  corrected upon review.

## 2023-02-16 NOTE — Telephone Encounter (Signed)
(  2:45 pm) PC SW attempted to contact patient and his brother to no avail. SW was unable to leave a message for patient or the brother.

## 2023-02-16 NOTE — Progress Notes (Signed)
Patient seen by Dr. Mohamed  Vitals are within treatment parameters.  Labs reviewed: and are within treatment parameters.  Per physician team, patient is ready for treatment and there are NO modifications to the treatment plan.  

## 2023-02-16 NOTE — Patient Instructions (Signed)
[facility]  Discharge Instructions: Thank you for choosing [facility] to provide your oncology and hematology care.   If you have a lab appointment with the Cancer Center, please go directly to the Cancer Center and check in at the registration area.   Wear comfortable clothing and clothing appropriate for easy access to any Portacath or PICC line.   We strive to give you quality time with your provider. You may need to reschedule your appointment if you arrive late (15 or more minutes).  Arriving late affects you and other patients whose appointments are after yours.  Also, if you miss three or more appointments without notifying the office, you may be dismissed from the clinic at the provider's discretion.      For prescription refill requests, have your pharmacy contact our office and allow 72 hours for refills to be completed.    Today you received the following chemotherapy and/or immunotherapy agents  Pembrolizumab (Keytruda)   To help prevent nausea and vomiting after your treatment, we encourage you to take your nausea medication as directed.  BELOW ARE SYMPTOMS THAT SHOULD BE REPORTED IMMEDIATELY: *FEVER GREATER THAN 100.4 F (38 C) OR HIGHER *CHILLS OR SWEATING *NAUSEA AND VOMITING THAT IS NOT CONTROLLED WITH YOUR NAUSEA MEDICATION *UNUSUAL SHORTNESS OF BREATH *UNUSUAL BRUISING OR BLEEDING *URINARY PROBLEMS (pain or burning when urinating, or frequent urination) *BOWEL PROBLEMS (unusual diarrhea, constipation, pain near the anus) TENDERNESS IN MOUTH AND THROAT WITH OR WITHOUT PRESENCE OF ULCERS (sore throat, sores in mouth, or a toothache) UNUSUAL RASH, SWELLING OR PAIN  UNUSUAL VAGINAL DISCHARGE OR ITCHING   Items with * indicate a potential emergency and should be followed up as soon as possible or go to the Emergency Department if any problems should occur.  Please show the CHEMOTHERAPY ALERT CARD or IMMUNOTHERAPY ALERT  CARD at check-in to the Emergency Department and triage nurse.  Should you have questions after your visit or need to cancel or reschedule your appointment, please contact [facility]  Dept: [phone]  and follow the prompts.  Office hours are 8:00 a.m. to 4:30 p.m. Monday - Friday. Please note that voicemails left after 4:00 p.m. may not be returned until the following business day.  We are closed weekends and major holidays. You have access to a nurse at all times for urgent questions. Please call the main number to the clinic Dept: [phone] and follow the prompts.   For any non-urgent questions, you may also contact your provider using MyChart. We now offer e-Visits for anyone 18 and older to request care online for non-urgent symptoms. For details visit [email or web address].   Also download the MyChart app! Go to the app store, search "MyChart", open the app, select [facility], and log in with your MyChart username and password.  Pembrolizumab Injection What is this medication? PEMBROLIZUMAB (PEM broe LIZ ue mab) treats some types of cancer. It works by helping your immune system slow or stop the spread of cancer cells. It is a monoclonal antibody. This medicine may be used for other purposes; ask your health care provider or pharmacist if you have questions. COMMON BRAND NAME(S): Keytruda What should I tell my care team before I take this medication? They need to know if you have any of these conditions: Allogeneic stem cell transplant (uses someone else's stem cells) Autoimmune diseases, such as Crohn disease, ulcerative colitis, lupus History of chest radiation Nervous system   problems, such as Guillain-Barre syndrome, myasthenia gravis Organ transplant An unusual or allergic reaction to pembrolizumab, other medications, foods, dyes, or preservatives Pregnant or trying to get pregnant Breast-feeding How should I use this medication? This  medication is injected into a vein. It is given by your care team in a hospital or clinic setting. A special MedGuide will be given to you before each treatment. Be sure to read this information carefully each time. Talk to your care team about the use of this medication in children. While it may be prescribed for children as young as 6 months for selected conditions, precautions do apply. Overdosage: If you think you have taken too much of this medicine contact a poison control center or emergency room at once. NOTE: This medicine is only for you. Do not share this medicine with others. What if I miss a dose? Keep appointments for follow-up doses. It is important not to miss your dose. Call your care team if you are unable to keep an appointment. What may interact with this medication? Interactions have not been studied. This list may not describe all possible interactions. Give your health care provider a list of all the medicines, herbs, non-prescription drugs, or dietary supplements you use. Also tell them if you smoke, drink alcohol, or use illegal drugs. Some items may interact with your medicine. What should I watch for while using this medication? Your condition will be monitored carefully while you are receiving this medication. You may need blood work while taking this medication. This medication may cause serious skin reactions. They can happen weeks to months after starting the medication. Contact your care team right away if you notice fevers or flu-like symptoms with a rash. The rash may be red or purple and then turn into blisters or peeling of the skin. You may also notice a red rash with swelling of the face, lips, or lymph nodes in your neck or under your arms. Tell your care team right away if you have any change in your eyesight. Talk to your care team if you may be pregnant. Serious birth defects can occur if you take this medication during pregnancy and for 4 months after the last  dose. You will need a negative pregnancy test before starting this medication. Contraception is recommended while taking this medication and for 4 months after the last dose. Your care team can help you find the option that works for you. Do not breastfeed while taking this medication and for 4 months after the last dose. What side effects may I notice from receiving this medication? Side effects that you should report to your care team as soon as possible: Allergic reactions--skin rash, itching, hives, swelling of the face, lips, tongue, or throat Dry cough, shortness of breath or trouble breathing Eye pain, redness, irritation, or discharge with blurry or decreased vision Heart muscle inflammation--unusual weakness or fatigue, shortness of breath, chest pain, fast or irregular heartbeat, dizziness, swelling of the ankles, feet, or hands Hormone gland problems--headache, sensitivity to light, unusual weakness or fatigue, dizziness, fast or irregular heartbeat, increased sensitivity to cold or heat, excessive sweating, constipation, hair loss, increased thirst or amount of urine, tremors or shaking, irritability Infusion reactions--chest pain, shortness of breath or trouble breathing, feeling faint or lightheaded Kidney injury (glomerulonephritis)--decrease in the amount of urine, red or dark brown urine, foamy or bubbly urine, swelling of the ankles, hands, or feet Liver injury--right upper belly pain, loss of appetite, nausea, light-colored stool, dark yellow or brown   urine, yellowing skin or eyes, unusual weakness or fatigue Pain, tingling, or numbness in the hands or feet, muscle weakness, change in vision, confusion or trouble speaking, loss of balance or coordination, trouble walking, seizures Rash, fever, and swollen lymph nodes Redness, blistering, peeling, or loosening of the skin, including inside the mouth Sudden or severe stomach pain, bloody diarrhea, fever, nausea, vomiting Side effects  that usually do not require medical attention (report to your care team if they continue or are bothersome): Bone, joint, or muscle pain Diarrhea Fatigue Loss of appetite Nausea Skin rash This list may not describe all possible side effects. Call your doctor for medical advice about side effects. You may report side effects to FDA at 1-800-FDA-1088. Where should I keep my medication? This medication is given in a hospital or clinic. It will not be stored at home. NOTE: This sheet is a summary. It may not cover all possible information. If you have questions about this medicine, talk to your doctor, pharmacist, or health care provider.  2023 Elsevier/Gold Standard (2022-03-16 00:00:00)   

## 2023-02-18 ENCOUNTER — Emergency Department (HOSPITAL_COMMUNITY): Payer: 59

## 2023-02-18 ENCOUNTER — Encounter (HOSPITAL_COMMUNITY): Payer: Self-pay

## 2023-02-18 ENCOUNTER — Inpatient Hospital Stay (HOSPITAL_COMMUNITY)
Admission: EM | Admit: 2023-02-18 | Discharge: 2023-03-16 | DRG: 208 | Disposition: E | Payer: 59 | Attending: Family Medicine | Admitting: Family Medicine

## 2023-02-18 ENCOUNTER — Other Ambulatory Visit: Payer: Self-pay

## 2023-02-18 DIAGNOSIS — Z833 Family history of diabetes mellitus: Secondary | ICD-10-CM

## 2023-02-18 DIAGNOSIS — E871 Hypo-osmolality and hyponatremia: Secondary | ICD-10-CM | POA: Diagnosis present

## 2023-02-18 DIAGNOSIS — J189 Pneumonia, unspecified organism: Secondary | ICD-10-CM | POA: Diagnosis present

## 2023-02-18 DIAGNOSIS — G9341 Metabolic encephalopathy: Secondary | ICD-10-CM | POA: Diagnosis present

## 2023-02-18 DIAGNOSIS — J441 Chronic obstructive pulmonary disease with (acute) exacerbation: Secondary | ICD-10-CM | POA: Diagnosis present

## 2023-02-18 DIAGNOSIS — I509 Heart failure, unspecified: Secondary | ICD-10-CM

## 2023-02-18 DIAGNOSIS — C3411 Malignant neoplasm of upper lobe, right bronchus or lung: Secondary | ICD-10-CM | POA: Diagnosis present

## 2023-02-18 DIAGNOSIS — C3491 Malignant neoplasm of unspecified part of right bronchus or lung: Secondary | ICD-10-CM | POA: Diagnosis not present

## 2023-02-18 DIAGNOSIS — C7889 Secondary malignant neoplasm of other digestive organs: Secondary | ICD-10-CM | POA: Diagnosis present

## 2023-02-18 DIAGNOSIS — I468 Cardiac arrest due to other underlying condition: Secondary | ICD-10-CM | POA: Diagnosis not present

## 2023-02-18 DIAGNOSIS — Z923 Personal history of irradiation: Secondary | ICD-10-CM

## 2023-02-18 DIAGNOSIS — J9601 Acute respiratory failure with hypoxia: Secondary | ICD-10-CM | POA: Diagnosis present

## 2023-02-18 DIAGNOSIS — E039 Hypothyroidism, unspecified: Secondary | ICD-10-CM | POA: Diagnosis present

## 2023-02-18 DIAGNOSIS — Z515 Encounter for palliative care: Secondary | ICD-10-CM | POA: Diagnosis not present

## 2023-02-18 DIAGNOSIS — N1831 Chronic kidney disease, stage 3a: Secondary | ICD-10-CM | POA: Diagnosis present

## 2023-02-18 DIAGNOSIS — E785 Hyperlipidemia, unspecified: Secondary | ICD-10-CM | POA: Diagnosis present

## 2023-02-18 DIAGNOSIS — Z880 Allergy status to penicillin: Secondary | ICD-10-CM

## 2023-02-18 DIAGNOSIS — J440 Chronic obstructive pulmonary disease with acute lower respiratory infection: Secondary | ICD-10-CM | POA: Diagnosis present

## 2023-02-18 DIAGNOSIS — C771 Secondary and unspecified malignant neoplasm of intrathoracic lymph nodes: Secondary | ICD-10-CM | POA: Diagnosis present

## 2023-02-18 DIAGNOSIS — Z1152 Encounter for screening for COVID-19: Secondary | ICD-10-CM | POA: Diagnosis not present

## 2023-02-18 DIAGNOSIS — Z91148 Patient's other noncompliance with medication regimen for other reason: Secondary | ICD-10-CM

## 2023-02-18 DIAGNOSIS — Z8249 Family history of ischemic heart disease and other diseases of the circulatory system: Secondary | ICD-10-CM

## 2023-02-18 DIAGNOSIS — J9621 Acute and chronic respiratory failure with hypoxia: Secondary | ICD-10-CM | POA: Diagnosis present

## 2023-02-18 DIAGNOSIS — I2489 Other forms of acute ischemic heart disease: Secondary | ICD-10-CM | POA: Diagnosis present

## 2023-02-18 DIAGNOSIS — R1310 Dysphagia, unspecified: Secondary | ICD-10-CM | POA: Diagnosis present

## 2023-02-18 DIAGNOSIS — Z955 Presence of coronary angioplasty implant and graft: Secondary | ICD-10-CM

## 2023-02-18 DIAGNOSIS — I5021 Acute systolic (congestive) heart failure: Secondary | ICD-10-CM | POA: Diagnosis not present

## 2023-02-18 DIAGNOSIS — I5043 Acute on chronic combined systolic (congestive) and diastolic (congestive) heart failure: Secondary | ICD-10-CM | POA: Diagnosis present

## 2023-02-18 DIAGNOSIS — Z720 Tobacco use: Secondary | ICD-10-CM | POA: Diagnosis present

## 2023-02-18 DIAGNOSIS — K219 Gastro-esophageal reflux disease without esophagitis: Secondary | ICD-10-CM | POA: Diagnosis present

## 2023-02-18 DIAGNOSIS — J123 Human metapneumovirus pneumonia: Principal | ICD-10-CM | POA: Insufficient documentation

## 2023-02-18 DIAGNOSIS — I2510 Atherosclerotic heart disease of native coronary artery without angina pectoris: Secondary | ICD-10-CM | POA: Diagnosis present

## 2023-02-18 DIAGNOSIS — Z9981 Dependence on supplemental oxygen: Secondary | ICD-10-CM

## 2023-02-18 DIAGNOSIS — Z7951 Long term (current) use of inhaled steroids: Secondary | ICD-10-CM

## 2023-02-18 DIAGNOSIS — I130 Hypertensive heart and chronic kidney disease with heart failure and stage 1 through stage 4 chronic kidney disease, or unspecified chronic kidney disease: Secondary | ICD-10-CM | POA: Diagnosis present

## 2023-02-18 DIAGNOSIS — Z85118 Personal history of other malignant neoplasm of bronchus and lung: Secondary | ICD-10-CM

## 2023-02-18 DIAGNOSIS — E872 Acidosis, unspecified: Secondary | ICD-10-CM | POA: Diagnosis present

## 2023-02-18 DIAGNOSIS — Z79899 Other long term (current) drug therapy: Secondary | ICD-10-CM

## 2023-02-18 DIAGNOSIS — Z87891 Personal history of nicotine dependence: Secondary | ICD-10-CM

## 2023-02-18 DIAGNOSIS — Z91199 Patient's noncompliance with other medical treatment and regimen due to unspecified reason: Secondary | ICD-10-CM

## 2023-02-18 DIAGNOSIS — I9589 Other hypotension: Secondary | ICD-10-CM | POA: Diagnosis present

## 2023-02-18 DIAGNOSIS — E861 Hypovolemia: Secondary | ICD-10-CM | POA: Diagnosis present

## 2023-02-18 DIAGNOSIS — Z902 Acquired absence of lung [part of]: Secondary | ICD-10-CM

## 2023-02-18 DIAGNOSIS — Z66 Do not resuscitate: Secondary | ICD-10-CM | POA: Diagnosis present

## 2023-02-18 DIAGNOSIS — Z9221 Personal history of antineoplastic chemotherapy: Secondary | ICD-10-CM

## 2023-02-18 DIAGNOSIS — I252 Old myocardial infarction: Secondary | ICD-10-CM | POA: Diagnosis not present

## 2023-02-18 DIAGNOSIS — Z9889 Other specified postprocedural states: Secondary | ICD-10-CM

## 2023-02-18 DIAGNOSIS — Z79890 Hormone replacement therapy (postmenopausal): Secondary | ICD-10-CM

## 2023-02-18 DIAGNOSIS — Z7189 Other specified counseling: Secondary | ICD-10-CM | POA: Diagnosis not present

## 2023-02-18 DIAGNOSIS — Z7982 Long term (current) use of aspirin: Secondary | ICD-10-CM

## 2023-02-18 DIAGNOSIS — C3490 Malignant neoplasm of unspecified part of unspecified bronchus or lung: Secondary | ICD-10-CM | POA: Diagnosis present

## 2023-02-18 DIAGNOSIS — G4710 Hypersomnia, unspecified: Secondary | ICD-10-CM | POA: Diagnosis present

## 2023-02-18 LAB — CBC WITH DIFFERENTIAL/PLATELET
Abs Immature Granulocytes: 0.05 10*3/uL (ref 0.00–0.07)
Basophils Absolute: 0 10*3/uL (ref 0.0–0.1)
Basophils Relative: 0 %
Eosinophils Absolute: 0 10*3/uL (ref 0.0–0.5)
Eosinophils Relative: 0 %
HCT: 41.6 % (ref 39.0–52.0)
Hemoglobin: 14 g/dL (ref 13.0–17.0)
Immature Granulocytes: 1 %
Lymphocytes Relative: 9 %
Lymphs Abs: 0.6 10*3/uL — ABNORMAL LOW (ref 0.7–4.0)
MCH: 29.9 pg (ref 26.0–34.0)
MCHC: 33.7 g/dL (ref 30.0–36.0)
MCV: 88.9 fL (ref 80.0–100.0)
Monocytes Absolute: 0.9 10*3/uL (ref 0.1–1.0)
Monocytes Relative: 14 %
Neutro Abs: 5.3 10*3/uL (ref 1.7–7.7)
Neutrophils Relative %: 76 %
Platelets: 221 10*3/uL (ref 150–400)
RBC: 4.68 MIL/uL (ref 4.22–5.81)
RDW: 18.3 % — ABNORMAL HIGH (ref 11.5–15.5)
WBC: 6.9 10*3/uL (ref 4.0–10.5)
nRBC: 0 % (ref 0.0–0.2)

## 2023-02-18 LAB — RESP PANEL BY RT-PCR (RSV, FLU A&B, COVID)  RVPGX2
Influenza A by PCR: NEGATIVE
Influenza B by PCR: NEGATIVE
Resp Syncytial Virus by PCR: NEGATIVE
SARS Coronavirus 2 by RT PCR: NEGATIVE

## 2023-02-18 LAB — COMPREHENSIVE METABOLIC PANEL
ALT: 43 U/L (ref 0–44)
AST: 38 U/L (ref 15–41)
Albumin: 3.7 g/dL (ref 3.5–5.0)
Alkaline Phosphatase: 86 U/L (ref 38–126)
Anion gap: 10 (ref 5–15)
BUN: 27 mg/dL — ABNORMAL HIGH (ref 8–23)
CO2: 20 mmol/L — ABNORMAL LOW (ref 22–32)
Calcium: 8.3 mg/dL — ABNORMAL LOW (ref 8.9–10.3)
Chloride: 104 mmol/L (ref 98–111)
Creatinine, Ser: 1.18 mg/dL (ref 0.61–1.24)
GFR, Estimated: >60 mL/min (ref >60)
Glucose, Bld: 77 mg/dL (ref 70–99)
Potassium: 4 mmol/L (ref 3.5–5.1)
Sodium: 134 mmol/L — ABNORMAL LOW (ref 135–145)
Total Bilirubin: 1.3 mg/dL — ABNORMAL HIGH (ref 0.3–1.2)
Total Protein: 6.6 g/dL (ref 6.5–8.1)

## 2023-02-18 LAB — TROPONIN I (HIGH SENSITIVITY)
Troponin I (High Sensitivity): 69 ng/L — ABNORMAL HIGH (ref <18)
Troponin I (High Sensitivity): 89 ng/L — ABNORMAL HIGH (ref <18)

## 2023-02-18 LAB — D-DIMER, QUANTITATIVE: D-Dimer, Quant: 1.97 ug/mL-FEU — ABNORMAL HIGH (ref 0.00–0.50)

## 2023-02-18 LAB — BRAIN NATRIURETIC PEPTIDE: B Natriuretic Peptide: 1206.6 pg/mL — ABNORMAL HIGH (ref 0.0–100.0)

## 2023-02-18 MED ORDER — MELATONIN 5 MG PO TABS
5.0000 mg | ORAL_TABLET | Freq: Every evening | ORAL | Status: DC | PRN
  Administered 2023-02-21: 5 mg via ORAL
  Filled 2023-02-18: qty 1

## 2023-02-18 MED ORDER — FLUTICASONE FUROATE-VILANTEROL 200-25 MCG/ACT IN AEPB
1.0000 | INHALATION_SPRAY | Freq: Every day | RESPIRATORY_TRACT | Status: DC
  Administered 2023-02-19 – 2023-02-21 (×3): 1 via RESPIRATORY_TRACT
  Filled 2023-02-18: qty 28

## 2023-02-18 MED ORDER — ONDANSETRON HCL 4 MG/2ML IJ SOLN
4.0000 mg | Freq: Once | INTRAMUSCULAR | Status: AC
  Administered 2023-02-18: 4 mg via INTRAVENOUS
  Filled 2023-02-18: qty 2

## 2023-02-18 MED ORDER — SODIUM CHLORIDE 0.9 % IV SOLN
500.0000 mg | INTRAVENOUS | Status: DC
  Administered 2023-02-18 – 2023-02-20 (×3): 500 mg via INTRAVENOUS
  Filled 2023-02-18 (×3): qty 5

## 2023-02-18 MED ORDER — PROCHLORPERAZINE EDISYLATE 10 MG/2ML IJ SOLN: 5.0000 mg | Freq: Four times a day (QID) | INTRAMUSCULAR | Status: DC | PRN

## 2023-02-18 MED ORDER — FUROSEMIDE 10 MG/ML IJ SOLN
40.0000 mg | Freq: Once | INTRAMUSCULAR | Status: AC
  Administered 2023-02-18: 40 mg via INTRAVENOUS
  Filled 2023-02-18: qty 4

## 2023-02-18 MED ORDER — SODIUM CHLORIDE 0.9 % IV SOLN
2.0000 g | Freq: Two times a day (BID) | INTRAVENOUS | Status: DC
  Administered 2023-02-19 – 2023-02-22 (×8): 2 g via INTRAVENOUS
  Filled 2023-02-18 (×8): qty 12.5

## 2023-02-18 MED ORDER — IOHEXOL 350 MG/ML SOLN
75.0000 mL | Freq: Once | INTRAVENOUS | Status: AC | PRN
  Administered 2023-02-18: 75 mL via INTRAVENOUS

## 2023-02-18 MED ORDER — ACETAMINOPHEN 325 MG PO TABS
650.0000 mg | ORAL_TABLET | Freq: Four times a day (QID) | ORAL | Status: DC | PRN
  Administered 2023-02-19: 650 mg via ORAL
  Filled 2023-02-18: qty 2

## 2023-02-18 MED ORDER — MONTELUKAST SODIUM 10 MG PO TABS
10.0000 mg | ORAL_TABLET | Freq: Every day | ORAL | Status: DC
  Administered 2023-02-18 – 2023-02-20 (×3): 10 mg via ORAL
  Filled 2023-02-18 (×3): qty 1

## 2023-02-18 MED ORDER — POLYETHYLENE GLYCOL 3350 17 G PO PACK
17.0000 g | PACK | Freq: Every day | ORAL | Status: DC
  Administered 2023-02-19 – 2023-02-21 (×3): 17 g via ORAL
  Filled 2023-02-18 (×3): qty 1

## 2023-02-18 MED ORDER — ENOXAPARIN SODIUM 40 MG/0.4ML IJ SOSY
40.0000 mg | PREFILLED_SYRINGE | INTRAMUSCULAR | Status: DC
  Administered 2023-02-18 – 2023-02-22 (×5): 40 mg via SUBCUTANEOUS
  Filled 2023-02-18 (×5): qty 0.4

## 2023-02-18 MED ORDER — POLYETHYLENE GLYCOL 3350 17 G PO PACK: 17.0000 g | PACK | Freq: Every day | ORAL | Status: DC | PRN

## 2023-02-18 MED ORDER — SODIUM BICARBONATE 650 MG PO TABS
650.0000 mg | ORAL_TABLET | Freq: Two times a day (BID) | ORAL | Status: DC
  Administered 2023-02-18 – 2023-02-21 (×6): 650 mg via ORAL
  Filled 2023-02-18 (×6): qty 1

## 2023-02-18 MED ORDER — LORAZEPAM 1 MG PO TABS
1.0000 mg | ORAL_TABLET | Freq: Two times a day (BID) | ORAL | Status: DC | PRN
  Administered 2023-02-21: 1 mg via ORAL
  Filled 2023-02-18: qty 1

## 2023-02-18 MED ORDER — IPRATROPIUM-ALBUTEROL 0.5-2.5 (3) MG/3ML IN SOLN
3.0000 mL | Freq: Four times a day (QID) | RESPIRATORY_TRACT | Status: DC
  Administered 2023-02-18 – 2023-02-19 (×3): 3 mL via RESPIRATORY_TRACT
  Filled 2023-02-18 (×4): qty 3

## 2023-02-18 MED ORDER — MORPHINE SULFATE (PF) 4 MG/ML IV SOLN
4.0000 mg | Freq: Once | INTRAVENOUS | Status: AC
  Administered 2023-02-18: 4 mg via INTRAVENOUS
  Filled 2023-02-18: qty 1

## 2023-02-18 MED ORDER — ASPIRIN 81 MG PO TBEC
81.0000 mg | DELAYED_RELEASE_TABLET | Freq: Every day | ORAL | Status: DC
  Administered 2023-02-19 – 2023-02-21 (×3): 81 mg via ORAL
  Filled 2023-02-18 (×3): qty 1

## 2023-02-18 MED ORDER — UMECLIDINIUM BROMIDE 62.5 MCG/ACT IN AEPB
1.0000 | INHALATION_SPRAY | Freq: Every day | RESPIRATORY_TRACT | Status: DC
  Administered 2023-02-19 – 2023-02-21 (×3): 1 via RESPIRATORY_TRACT
  Filled 2023-02-18: qty 7

## 2023-02-18 MED ORDER — MIDODRINE HCL 5 MG PO TABS
5.0000 mg | ORAL_TABLET | Freq: Three times a day (TID) | ORAL | Status: DC
  Administered 2023-02-19 – 2023-02-22 (×10): 5 mg via ORAL
  Filled 2023-02-18 (×10): qty 1

## 2023-02-18 MED ORDER — ROSUVASTATIN CALCIUM 10 MG PO TABS
5.0000 mg | ORAL_TABLET | Freq: Every day | ORAL | Status: DC
  Administered 2023-02-19 – 2023-02-21 (×3): 5 mg via ORAL
  Filled 2023-02-18 (×3): qty 1

## 2023-02-18 MED ORDER — OXYCODONE HCL 5 MG PO TABS
10.0000 mg | ORAL_TABLET | Freq: Four times a day (QID) | ORAL | Status: DC | PRN
  Administered 2023-02-19 – 2023-02-21 (×4): 10 mg via ORAL
  Filled 2023-02-18 (×4): qty 2

## 2023-02-18 MED ORDER — IPRATROPIUM-ALBUTEROL 0.5-2.5 (3) MG/3ML IN SOLN
3.0000 mL | Freq: Once | RESPIRATORY_TRACT | Status: AC
  Administered 2023-02-18: 3 mL via RESPIRATORY_TRACT
  Filled 2023-02-18: qty 3

## 2023-02-18 MED ORDER — SENNOSIDES-DOCUSATE SODIUM 8.6-50 MG PO TABS
2.0000 | ORAL_TABLET | Freq: Two times a day (BID) | ORAL | Status: DC
  Administered 2023-02-18 – 2023-02-21 (×6): 2 via ORAL
  Filled 2023-02-18 (×6): qty 2

## 2023-02-18 MED ORDER — SODIUM CHLORIDE 0.9 % IV SOLN
1.0000 g | Freq: Once | INTRAVENOUS | Status: AC
  Administered 2023-02-18: 1 g via INTRAVENOUS
  Filled 2023-02-18: qty 10

## 2023-02-18 NOTE — ED Provider Notes (Signed)
[place] EMERGENCY DEPARTMENT AT [facility] Provider Note   CSN: [id number] Arrival date & time: 03/03/2023  1711     History  Chief Complaint  Patient presents with   Chest Pain   Shortness of Breath    Ronald [name] is a 67 y.o. 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   Patient was examined and from 02/04/2023 to 02/10/2023 after presenting with pleuritic chest pain dyspnea wheezing and productive cough, had chest x-ray and lower lung fields suspicious for pneumonia and CTA showed possible right lower lobe infiltrate.  Treated with ceftriaxone and azithromycin.  Patient is here for evaluation of right-sided chest pain/lung pain, constant, rated 8/10. Reports that he has been short of breath X 1 hour. Diagnosed with stage 4 lung cancer and s/p L pneumonectomy. Wears home O2 @ 2L.  Patient reports he received chemotherapy this week. Also endorses increased cough productive of sputum, denies hemoptysis.    Chest Pain Associated symptoms: shortness of breath   Shortness of Breath Associated symptoms: chest pain        Home Medications Prior to Admission medications   Medication Sig Start Date End Date Taking? Authorizing Provider  acetaminophen (TYLENOL) 500 MG tablet Take 2 tablets (1,000 mg total) by mouth every 8 (eight) hours as needed. 02/10/23   Burnadette Pop, MD  albuterol (PROVENTIL HFA;VENTOLIN HFA) 108 (90 BASE) MCG/ACT inhaler Inhale 2 puffs into the lungs every 4 (four) hours as needed for wheezing or shortness of breath. 10/14/13   Ward, Layla Maw, DO  aspirin EC 81 MG tablet Take 81 mg by mouth daily. Swallow whole.    [provider]  gabapentin (NEURONTIN) 300 MG capsule Take 300 mg by mouth 3 (three) times daily.    [provider]  levothyroxine (SYNTHROID) 75 MCG tablet Take 75 mcg by  mouth daily. Patient not taking: Reported on 02/05/2023 01/19/23   [provider]  lidocaine (LIDODERM) 5 % Place 1 patch onto the skin daily. Remove & Discard patch within 12 hours or as directed by MD 02/11/23   Burnadette Pop, MD  lidocaine-prilocaine (EMLA) cream Apply 1 Application topically as needed (To Port-a-Cath). 12/10/22   Si Gaul, MD  LORazepam (ATIVAN) 1 MG tablet Take 1 tablet (1 mg total) by mouth 2 (two) times daily as needed for anxiety. 12/27/22   Sloan Leiter, DO  midodrine (PROAMATINE) 5 MG tablet Take 1 tablet (5 mg total) by mouth 3 (three) times daily with meals. 02/10/23   Burnadette Pop, MD  montelukast (SINGULAIR) 10 MG tablet Take 1 tablet (10 mg total) by mouth at bedtime. 05/02/15   Withrow, Everardo All, FNP  nitroGLYCERIN (NITROSTAT) 0.4 MG SL tablet Place 1 tablet (0.4 mg total) under the tongue every 5 (five) minutes x 3 doses as needed for chest pain. Patient not taking: Reported on 02/05/2023 11/01/13   Rinaldo Cloud, MD  Oxycodone HCl 10 MG TABS Take 1 tablet (10 mg total) by mouth every 6 (six) hours as needed for moderate pain or severe pain (shortness of breath). 02/10/23   Burnadette Pop, MD  polyethylene glycol (MIRALAX / GLYCOLAX) 17 g packet Take 17 g by mouth daily. 02/11/23   Burnadette Pop, MD  predniSONE (DELTASONE) 10 MG tablet Take 1 tablet (10 mg total) by mouth daily with breakfast for 3 days. 02/16/23 02/19/23  Burnadette PopAdhikari, Amrit, MD  rosuvastatin (CRESTOR) 5 MG tablet Take 5 mg by mouth daily. 12/03/22   [provider]  senna-docusate (SENOKOT-S) 8.6-50 MG tablet Take 2 tablets by mouth 2 (two) times daily. 02/10/23   Burnadette PopAdhikari, Amrit, MD  sodium bicarbonate 650 MG tablet Take 1 tablet (650 mg total) by mouth 2 (two) times daily. 02/10/23   Burnadette PopAdhikari, Amrit, MD  TRELEGY ELLIPTA 200-62.5-25 MCG/ACT AEPB Take 1 puff by mouth daily. 12/02/22   [provider]  zolpidem (AMBIEN) 10 MG tablet Take 10 mg by mouth at bedtime. 01/21/23    [provider]      Allergies    Penicillins    Review of Systems   Review of Systems  Respiratory:  Positive for shortness of breath.   Cardiovascular:  Positive for chest pain.   Review of systems Negative for f/c.  A 10 point review of systems was performed and is negative unless otherwise reported in HPI.  Physical Exam Updated Vital Signs BP 127/76   Pulse 94   Temp 98.9 F (37.2 C) (Oral)   Resp (!) 23   Ht 5' (1.524 m)   Wt 65.8 kg   SpO2 96%   BMI 28.32 kg/m  Physical Exam General: Chronically ill-appearing male, lying in bed.  HEENT: PERRLA, Sclera anicteric, MMM, trachea midline.  Cardiology: RRR, no murmurs/rubs/gallops. BL radial and DP pulses equal bilaterally.  Resp: Normal respiratory rate and effort. Expiratory wheezes heard on R side. no rhonchi, crackles.  Abd: Soft, non-tender, non-distended. No rebound tenderness or guarding.  GU: Deferred. MSK: No peripheral edema or signs of trauma. Extremities without deformity or TTP. No cyanosis or clubbing. Skin: warm, dry. Neuro: A&Ox4, CNs II-XII grossly intact. MAEs. Sensation grossly intact.  Psych: Normal mood and affect.   ED Results / Procedures / Treatments   Labs (all labs ordered are listed, but only abnormal results are displayed) Labs Reviewed  CBC WITH DIFFERENTIAL/PLATELET - Abnormal; Notable for the following components:      Result Value   RDW 18.3 (*)    Lymphs Abs 0.6 (*)    All other components within normal limits  COMPREHENSIVE METABOLIC PANEL - Abnormal; Notable for the following components:   Sodium 134 (*)    CO2 20 (*)    BUN 27 (*)    Calcium 8.3 (*)    Total Bilirubin 1.3 (*)    All other components within normal limits  BRAIN NATRIURETIC PEPTIDE - Abnormal; Notable for the following components:   B Natriuretic Peptide 1,206.6 (*)    All other components within normal limits  D-DIMER, QUANTITATIVE - Abnormal; Notable for the following components:   D-Dimer, Quant  1.97 (*)    All other components within normal limits  TROPONIN I (HIGH SENSITIVITY) - Abnormal; Notable for the following components:   Troponin I (High Sensitivity) 69 (*)    All other components within normal limits  TROPONIN I (HIGH SENSITIVITY) - Abnormal; Notable for the following components:   Troponin I (High Sensitivity) 89 (*)    All other components within normal limits  RESP PANEL BY RT-PCR (RSV, FLU A&B, COVID)  RVPGX2  MRSA NEXT GEN BY PCR, NASAL  CBC  COMPREHENSIVE METABOLIC PANEL  MAGNESIUM  PHOSPHORUS  TROPONIN I (HIGH SENSITIVITY)    EKG EKG Interpretation  Date/Time:  Friday February 18 2023 17:32:32 EDT Ventricular Rate:  101 PR Interval:  170 QRS Duration: 128 QT Interval:  375 QTC Calculation: 487 R Axis:   -  77 Text Interpretation: Sinus tachycardia Atrial premature complex Probable left atrial enlargement Right bundle branch block Similar to prior Baseline wander in lead(s) V2 Confirmed by Vivi Barrack [phone]) on 2023/03/16 9:59:58 PM  Radiology CT Angio Chest PE W and/or Wo Contrast  Result Date: 03-16-2023 CLINICAL DATA:  Patient is here for evaluation of right chest/lung pain and shortness of breath. Stage IV lung cancer status post left pneumonectomy. EXAM: CT ANGIOGRAPHY CHEST WITH CONTRAST TECHNIQUE: Multidetector CT imaging of the chest was performed using the standard protocol during bolus administration of intravenous contrast. Multiplanar CT image reconstructions and MIPs were obtained to evaluate the vascular anatomy. RADIATION DOSE REDUCTION: This exam was performed according to the departmental dose-optimization program which includes automated exposure control, adjustment of the mA and/or kV according to patient size and/or use of iterative reconstruction technique. CONTRAST:  24mL OMNIPAQUE IOHEXOL 350 MG/ML SOLN COMPARISON:  Radiographs 03/16/23 and CT chest 02/04/2023 FINDINGS: Cardiovascular: Satisfactory opacification of the pulmonary arteries to  the segmental level. No evidence of pulmonary embolism. Normal heart size. Trace pericardial effusion. Coronary stenting and atherosclerotic calcification. Aortic calcification. Reflux of contrast into the IVC and hepatic veins compatible with elevated right heart pressures. The main pulmonary artery is mildly dilated measuring 34 mm in maximum diameter. Mediastinum/Nodes: Unchanged 1.5 cm right hilar node. No mediastinal or axillary adenopathy. Unremarkable esophagus. Lungs/Pleura: Left pneumonectomy. Diffuse interlobular septal thickening. Patchy airspace opacities in the right upper lobe are new since 02/04/2023 (6/41 and 6/51). Unchanged spiculated right middle lobe mass measuring 2.0 cm (6/90). Fiducial markers are present adjacent to the mass. No pleural effusion or pneumothorax. Upper Abdomen: No acute abnormality. Musculoskeletal: Postsurgical changes left ribs. No acute osseous findings. Review of the MIP images confirms the above findings. IMPRESSION: 1. No evidence of pulmonary embolism. 2. Patchy airspace opacities in the right upper lobe, new since 02/04/2023, concerning for pneumonia. Recommend follow-up chest CT in 3 months to ensure resolution. 3. Diffuse interlobular septal thickening throughout the right lung. Favor edema though infection or lymphangitic spread of carcinoma could appear similarly. 4. Unchanged spiculated right middle lobe mass measuring 2.0 cm. Unchanged 1.5 cm right hilar node. 5. Aortic atherosclerosis. Aortic Atherosclerosis (ICD10-I70.0). Electronically Signed   By: Minerva Fester M.D.   On: Mar 16, 2023 21:25   DG Chest 2 View  Result Date: 03/16/23 CLINICAL DATA:  Shortness of breath and chest pain EXAM: CHEST - 2 VIEW COMPARISON:  Chest x-ray dated February 08, 2023 FINDINGS: Stable findings of prior left pneumonectomy. Limited evaluation of the cardiac and mediastinal contours due to mediastinal shift. Stable left chest wall port. Increased interstitial opacities of the  right hemithorax. No evidence of pleural effusion or pneumothorax. IMPRESSION: Increased interstitial opacities of the right hemithorax, concerning for pulmonary edema or infection. Electronically Signed   By: Allegra Lai M.D.   On: 2023-03-16 19:01    Procedures Procedures    Medications Ordered in ED Medications  azithromycin (ZITHROMAX) 500 mg in sodium chloride 0.9 % 250 mL IVPB (500 mg Intravenous New Bag/Given 16-Mar-2023 2301)  ipratropium-albuterol (DUONEB) 0.5-2.5 (3) MG/3ML nebulizer solution 3 mL (3 mLs Nebulization Given 03/16/2023 2305)  sodium bicarbonate tablet 650 mg (650 mg Oral Given 2023-03-16 2305)  morphine (PF) 4 MG/ML injection 4 mg (4 mg Intravenous Given Mar 16, 2023 1957)  ondansetron (ZOFRAN) injection 4 mg (4 mg Intravenous Given Mar 16, 2023 1957)  ipratropium-albuterol (DUONEB) 0.5-2.5 (3) MG/3ML nebulizer solution 3 mL (3 mLs Nebulization Given 2023/03/16 1957)  furosemide (LASIX) injection 40 mg (40 mg Intravenous  Given 03/05/2023 2039)  iohexol (OMNIPAQUE) 350 MG/ML injection 75 mL (75 mLs Intravenous Contrast Given 02/28/2023 2105)  cefTRIAXone (ROCEPHIN) 1 g in sodium chloride 0.9 % 100 mL IVPB (0 g Intravenous Stopped 02/21/2023 2251)    ED Course/ Medical Decision Making/ A&P                          Medical Decision Making Amount and/or Complexity of Data Reviewed Labs: ordered. Decision-making details documented in ED Course. Radiology: ordered. Decision-making details documented in ED Course.  Risk Prescription drug management. Decision regarding hospitalization.    This patient presents to the ED for concern of CP/SOB/cough, this involves an extensive number of treatment options, and is a complaint that carries with it a high risk of complications and morbidity.  I considered the following differential and admission for this acute, potentially life threatening condition.   MDM:    DDX for chest pain/SOB includes but is not limited to:  CHF or COPD exacerbation given  patient's dyspnea/wheezing. Given extensive CAD history, must consider ACS/arrhythmia, and with active cancer must consider PE esp with rapid onset. Also consider recurrent PNA given recent admission/cough/sputum production. Considered aortic dissection, PTX, biliary disease, pericarditis, GERD/PUD/gastritis, or musculoskeletal pain, but believe this is less likely. Also consider anemia, sepsis. Will obtain labs/imaging.   Clinical Course as of 03/15/2023 2328  Fri Feb 18, 2023  1833 CBC with Differential(!) Unremarkable, no leukocytosis or neutropenia [HN]  2009 Troponin I (High Sensitivity)(!): 69 [HN]  2010 D-Dimer, Quant(!): 1.97 [HN]  2010 B Natriuretic Peptide(!): 1,206.6 Likely w/ pulm edema/HF exacerbation. Will give lasix. CTA ordered. [HN]  2011 DG Chest 2 View IMPRESSION: Increased interstitial opacities of the right hemithorax, concerning for pulmonary edema or infection.   [HN]  2011 Was recently treated for PNA of RLL, will further eval w/ CT [HN]  2151 Troponin I (High Sensitivity)(!): 89 Trop 69-89 [HN]  2152 CT Angio Chest PE W and/or Wo Contrast IMPRESSION: 1. No evidence of pulmonary embolism. 2. Patchy airspace opacities in the right upper lobe, new since 02/04/2023, concerning for pneumonia. Recommend follow-up chest CT in 3 months to ensure resolution. 3. Diffuse interlobular septal thickening throughout the right lung. Favor edema though infection or lymphangitic spread of carcinoma could appear similarly. 4. Unchanged spiculated right middle lobe mass measuring 2.0 cm. Unchanged 1.5 cm right hilar node. 5. Aortic atherosclerosis.  Aortic Atherosclerosis (ICD10-I70.0).   [HN]  2152 CTA concerning for RUL pneumonia. No PE. W/ trop increase will consult to cardiology, likely admit for PNA. Abx ordered. [HN]  2158 D/w cardiology who states likely chronic myocardial injury for trop bump would explain that.  [HN]  2224 Admitted to medicine w/ cards following [HN]     Clinical Course User Index [HN] Loetta Rough, MD    Labs: I Ordered, and personally interpreted labs.  The pertinent results include:  those listed above  Imaging Studies ordered: I ordered imaging studies including CXR I independently visualized and interpreted imaging. I agree with the radiologist interpretation  Additional history obtained from chart review.    Cardiac Monitoring: The patient was maintained on a cardiac monitor.  I personally viewed and interpreted the cardiac monitored which showed an underlying rhythm of: NSR, sinus tachycardia  Reevaluation: After the interventions noted above, I reevaluated the patient and found that they have :stayed the same  Social Determinants of Health: Patient lives independently   Disposition: Admitted to medicine  Co morbidities  that complicate the patient evaluation  Past Medical History:  Diagnosis Date   CHF (congestive heart failure)    COPD (chronic obstructive pulmonary disease)    Coronary artery disease    Hypertension    lung ca dx'd 2005   chemo/xrt comp 2005, lung ca   Myocardial infarction    Shortness of breath      Medicines Meds ordered this encounter  Medications   morphine (PF) 4 MG/ML injection 4 mg   ondansetron (ZOFRAN) injection 4 mg   ipratropium-albuterol (DUONEB) 0.5-2.5 (3) MG/3ML nebulizer solution 3 mL   furosemide (LASIX) injection 40 mg   iohexol (OMNIPAQUE) 350 MG/ML injection 75 mL   cefTRIAXone (ROCEPHIN) 1 g in sodium chloride 0.9 % 100 mL IVPB    Order Specific Question:   Antibiotic Indication:    Answer:   CAP   azithromycin (ZITHROMAX) 500 mg in sodium chloride 0.9 % 250 mL IVPB    Order Specific Question:   Antibiotic Indication:    Answer:   CAP   enoxaparin (LOVENOX) injection 40 mg   aspirin EC tablet 81 mg    Swallow whole.     LORazepam (ATIVAN) tablet 1 mg   midodrine (PROAMATINE) tablet 5 mg   montelukast (SINGULAIR) tablet 10 mg   polyethylene glycol  (MIRALAX / GLYCOLAX) packet 17 g   oxyCODONE (Oxy IR/ROXICODONE) immediate release tablet 10 mg   rosuvastatin (CRESTOR) tablet 5 mg   senna-docusate (Senokot-S) tablet 2 tablet   AND Linked Order Group    fluticasone furoate-vilanterol (BREO ELLIPTA) 200-25 MCG/ACT 1 puff    umeclidinium bromide (INCRUSE ELLIPTA) 62.5 MCG/ACT 1 puff   prochlorperazine (COMPAZINE) injection 5 mg   acetaminophen (TYLENOL) tablet 650 mg   polyethylene glycol (MIRALAX / GLYCOLAX) packet 17 g   melatonin tablet 5 mg   ceFEPIme (MAXIPIME) 2 g in sodium chloride 0.9 % 100 mL IVPB    Order Specific Question:   Antibiotic Indication:    Answer:   HCAP   ipratropium-albuterol (DUONEB) 0.5-2.5 (3) MG/3ML nebulizer solution 3 mL   sodium bicarbonate tablet 650 mg    I have reviewed the patients home medicines and have made adjustments as needed  Problem List / ED Course: Problem List Items Addressed This Visit       Cardiovascular and Mediastinum   Acute on chronic congestive heart failure   Relevant Medications   enoxaparin (LOVENOX) injection 40 mg   aspirin EC tablet 81 mg (Start on 02/19/2023 10:00 AM)   midodrine (PROAMATINE) tablet 5 mg (Start on 02/19/2023  8:00 AM)   rosuvastatin (CRESTOR) tablet 5 mg (Start on 02/19/2023 10:00 AM)     Respiratory   * (Principal) Pneumonia - Primary   Relevant Medications   azithromycin (ZITHROMAX) 500 mg in sodium chloride 0.9 % 250 mL IVPB   montelukast (SINGULAIR) tablet 10 mg   fluticasone furoate-vilanterol (BREO ELLIPTA) 200-25 MCG/ACT 1 puff (Start on 02/19/2023  8:00 AM)   umeclidinium bromide (INCRUSE ELLIPTA) 62.5 MCG/ACT 1 puff (Start on 02/19/2023  8:00 AM)   ceFEPIme (MAXIPIME) 2 g in sodium chloride 0.9 % 100 mL IVPB (Start on 02/19/2023  4:00 AM)   ipratropium-albuterol (DUONEB) 0.5-2.5 (3) MG/3ML nebulizer solution 3 mL                This note was created using dictation software, which may contain spelling or grammatical errors.    Loetta Rough, MD 03/15/23 [phone]

## 2023-02-18 NOTE — ED Triage Notes (Addendum)
Patient is here for evaluation of right chest pain/lung pain. Reports that he has been short of breath X 1 hour. Diagnosed with stage 4 lung cancer and lost entire left lung. Wears home O2 @ 1L. Machine died in triage, placed on wall oxygen.

## 2023-02-18 NOTE — Progress Notes (Signed)
Pharmacy Antibiotic Note  Ronald [name] is a 67 y.o. male admitted on 03/01/2023 with evaluation of right chest pain/lung pain. Reports that he has been short of breath X 1 hour. Diagnosed with stage 4 lung cancer and lost entire left lung .  Pharmacy has been consulted to dose cefepime for HCAP  Plan: Cefepime 2gm IV q12h Follow renal function, cultures and clinical course  Height: 5' (152.4 cm) Weight: 65.8 kg (145 lb) IBW/kg (Calculated) : 50  Temp (24hrs), Avg:98.8 F (37.1 C), Min:98.6 F (37 C), Max:98.9 F (37.2 C)  Recent Labs  Lab 02/16/23 1013 02/20/2023 1813  WBC 8.9 6.9  CREATININE 1.31* 1.18    Estimated Creatinine Clearance: 49.7 mL/min (by C-G formula based on SCr of 1.18 mg/dL).    Allergies  Allergen Reactions   Penicillins Itching     Thank you for allowing pharmacy to be a part of this patient's care.  Arley Phenix RPh 02/25/2023, 10:47 PM

## 2023-02-18 NOTE — H&P (Addendum)
History and Physical  Ronald [name]:[id number] DOB: 20-May-1956 DOA: 02/17/2023  Referring physician: Dr. Jearld Fenton, EDP  PCP: Estevan Oaks, NP  Outpatient Specialists: Medical oncology Patient coming from: Home  Chief Complaint: Shortness of breath, chest pain   HPI: Ronald [name] is a 67 y.o. male with medical history significant for chronic combined diastolic and systolic CHF, non-small cell lung cancer status post left pneumonectomy/chemotherapy/radiation, moderate mitral and tricuspid valves regurgitation, hyperlipidemia, recently admitted for right lower lobe pneumonia who presented to [facility] ED from home with complaints of chest pain and shortness of breath.  Onset of symptoms hours prior to presentation in the ED. associated with a productive cough with yellow sputum.  Denies dysphagia to solids or liquids.  Denies subjective fevers but admits to chills.  While in the ED, no leukocytosis, afebrile.  No evidence of acute ischemia on twelve-lead EKG.  High-sensitivity troponin mildly elevated.  EDP discussed the case with cardiology who recommended trending troponin and close monitoring for now.  Suspected demand ischemia at this time.  Chest x-ray and subsequent chest CT scan revealed right upper lobe infiltrates suggestive of pneumonia.  No evidence of pulmonary embolism on CT angio chest.  The patient was started on empiric IV antibiotics Rocephin and azithromycin in the ED.  TR[facility], hospitalist service, was asked to admit.  ED Course: Tmax 98.9.  BP 127/76, pulse 94, respiratory rate 23, O2 saturation 96% on 2 L.  Lab studies markable for serum sodium 134, serum bicarb 20, creatinine 1.18 with GFR greater than 60.  T. bili Ruben 1.3.  Review of Systems: Review of systems as noted in the HPI. All other systems reviewed and are negative.   Past Medical History:  Diagnosis Date   CHF (congestive heart failure)    COPD (chronic obstructive pulmonary disease)    Coronary artery  disease    Hypertension    lung ca dx'd 2005   chemo/xrt comp 2005, lung ca   Myocardial infarction    Shortness of breath    Past Surgical History:  Procedure Laterality Date   CARDIAC CATHETERIZATION N/A 10/13/2015   Procedure: Left Heart Cath and Coronary Angiography;  Surgeon: Rinaldo Cloud, MD;  Location: [facility] INVASIVE CV LAB;  Service: Cardiovascular;  Laterality: N/A;   CORONARY ANGIOPLASTY WITH STENT PLACEMENT     LEFT HEART CATH AND CORONARY ANGIOGRAPHY N/A 01/03/2018   Procedure: LEFT HEART CATH AND CORONARY ANGIOGRAPHY;  Surgeon: Rinaldo Cloud, MD;  Location: [facility] INVASIVE CV LAB;  Service: Cardiovascular;  Laterality: N/A;   LEFT HEART CATHETERIZATION WITH CORONARY ANGIOGRAM N/A 04/07/2012   Procedure: LEFT HEART CATHETERIZATION WITH CORONARY ANGIOGRAM;  Surgeon: Robynn Pane, MD;  Location: [facility] CATH LAB;  Service: Cardiovascular;  Laterality: N/A;   PNEUMONECTOMY  2005   left   vocal cord surgery  2005    Social History:  reports that he quit smoking about 9 years ago. His smoking use included cigarettes. He has never used smokeless tobacco. He reports that he does not drink alcohol and does not use drugs.   Allergies  Allergen Reactions   Penicillins Itching    Family History  Problem Relation Age of Onset   Hypertension Other    Diabetes Other       Prior to Admission medications   Medication Sig Start Date End Date Taking? Authorizing Provider  acetaminophen (TYLENOL) 500 MG tablet Take 2 tablets (1,000 mg total) by mouth every 8 (eight) hours as needed. 02/10/23   Burnadette Pop,  MD  albuterol (PROVENTIL HFA;VENTOLIN HFA) 108 (90 BASE) MCG/ACT inhaler Inhale 2 puffs into the lungs every 4 (four) hours as needed for wheezing or shortness of breath. 10/14/13   Ward, Layla Maw, DO  aspirin EC 81 MG tablet Take 81 mg by mouth daily. Swallow whole.    [provider]  gabapentin (NEURONTIN) 300 MG capsule Take 300 mg by mouth 3 (three) times daily.     [provider]  levothyroxine (SYNTHROID) 75 MCG tablet Take 75 mcg by mouth daily. Patient not taking: Reported on 02/05/2023 01/19/23   [provider]  lidocaine (LIDODERM) 5 % Place 1 patch onto the skin daily. Remove & Discard patch within 12 hours or as directed by MD 02/11/23   Burnadette Pop, MD  lidocaine-prilocaine (EMLA) cream Apply 1 Application topically as needed (To Port-a-Cath). 12/10/22   Si Gaul, MD  LORazepam (ATIVAN) 1 MG tablet Take 1 tablet (1 mg total) by mouth 2 (two) times daily as needed for anxiety. 12/27/22   Sloan Leiter, DO  midodrine (PROAMATINE) 5 MG tablet Take 1 tablet (5 mg total) by mouth 3 (three) times daily with meals. 02/10/23   Burnadette Pop, MD  montelukast (SINGULAIR) 10 MG tablet Take 1 tablet (10 mg total) by mouth at bedtime. 05/02/15   Withrow, Everardo All, FNP  nitroGLYCERIN (NITROSTAT) 0.4 MG SL tablet Place 1 tablet (0.4 mg total) under the tongue every 5 (five) minutes x 3 doses as needed for chest pain. Patient not taking: Reported on 02/05/2023 11/01/13   Rinaldo Cloud, MD  Oxycodone HCl 10 MG TABS Take 1 tablet (10 mg total) by mouth every 6 (six) hours as needed for moderate pain or severe pain (shortness of breath). 02/10/23   Burnadette Pop, MD  polyethylene glycol (MIRALAX / GLYCOLAX) 17 g packet Take 17 g by mouth daily. 02/11/23   Burnadette Pop, MD  predniSONE (DELTASONE) 10 MG tablet Take 1 tablet (10 mg total) by mouth daily with breakfast for 3 days. 02/16/23 02/19/23  Burnadette Pop, MD  rosuvastatin (CRESTOR) 5 MG tablet Take 5 mg by mouth daily. 12/03/22   [provider]  senna-docusate (SENOKOT-S) 8.6-50 MG tablet Take 2 tablets by mouth 2 (two) times daily. 02/10/23   Burnadette Pop, MD  sodium bicarbonate 650 MG tablet Take 1 tablet (650 mg total) by mouth 2 (two) times daily. 02/10/23   Burnadette Pop, MD  TRELEGY ELLIPTA 200-62.5-25 MCG/ACT AEPB Take 1 puff by mouth daily. 12/02/22   [provider]  zolpidem (AMBIEN) 10 MG tablet Take 10 mg by mouth at bedtime. 01/21/23   [provider]    Physical Exam: BP (!) 125/93   Pulse 94   Temp 98.9 F (37.2 C) (Oral)   Resp (!) 24   Ht 5' (1.524 m)   Wt 65.8 kg   SpO2 100%   BMI 28.32 kg/m   General: 67 y.o. year-old male well developed well nourished in no acute distress.  Alert and oriented x3. Cardiovascular: Right JVD.  Anterior chest wall tenderness with palpation.  Regular rate and rhythm with no rubs or gallops.  No thyromegaly.  No lower extremity edema. 2/4 pulses in all 4 extremities. Respiratory: Diffuse rales right lung.  No wheezing noted.  Poor inspiratory effort. Abdomen: Soft nontender nondistended with normal bowel sounds x4 quadrants. Muskuloskeletal: No cyanosis, clubbing or edema noted bilaterally Neuro: CN II-XII intact, strength, sensation, reflexes Skin: No ulcerative lesions noted or rashes Psychiatry: Judgement and insight appear  normal. Mood is appropriate for condition and setting          Labs on Admission:  Basic Metabolic Panel: Recent Labs  Lab 02/16/23 1013 02/20/2023 1813  NA 137 134*  K 3.7 4.0  CL 102 104  CO2 27 20*  GLUCOSE 98 77  BUN 35* 27*  CREATININE 1.31* 1.18  CALCIUM 8.8* 8.3*   Liver Function Tests: Recent Labs  Lab 02/16/23 1013 03/14/2023 1813  AST 60* 38  ALT 62* 43  ALKPHOS 120 86  BILITOT 0.6 1.3*  PROT 7.1 6.6  ALBUMIN 3.9 3.7   No results for input(s): "LIPASE", "AMYLASE" in the last 168 hours. No results for input(s): "AMMONIA" in the last 168 hours. CBC: Recent Labs  Lab 02/16/23 1013 02/22/2023 1813  WBC 8.9 6.9  NEUTROABS 7.1 5.3  HGB 12.6* 14.0  HCT 36.6* 41.6  MCV 87.4 88.9  PLT 265 221   Cardiac Enzymes: No results for input(s): "CKTOTAL", "CKMB", "CKMBINDEX", "TROPONINI" in the last 168 hours.  BNP (last 3 results) Recent Labs    12/27/22 0745 02/04/23 1944 02/17/2023 1814  BNP 752.6* 754.7* 1,206.6*    ProBNP (last 3  results) No results for input(s): "PROBNP" in the last 8760 hours.  CBG: No results for input(s): "GLUCAP" in the last 168 hours.  Radiological Exams on Admission: CT Angio Chest PE W and/or Wo Contrast  Result Date: 02/17/2023 CLINICAL DATA:  Patient is here for evaluation of right chest/lung pain and shortness of breath. Stage IV lung cancer status post left pneumonectomy. EXAM: CT ANGIOGRAPHY CHEST WITH CONTRAST TECHNIQUE: Multidetector CT imaging of the chest was performed using the standard protocol during bolus administration of intravenous contrast. Multiplanar CT image reconstructions and MIPs were obtained to evaluate the vascular anatomy. RADIATION DOSE REDUCTION: This exam was performed according to the departmental dose-optimization program which includes automated exposure control, adjustment of the mA and/or kV according to patient size and/or use of iterative reconstruction technique. CONTRAST:  75mL OMNIPAQUE IOHEXOL 350 MG/ML SOLN COMPARISON:  Radiographs 03/10/2023 and CT chest 02/04/2023 FINDINGS: Cardiovascular: Satisfactory opacification of the pulmonary arteries to the segmental level. No evidence of pulmonary embolism. Normal heart size. Trace pericardial effusion. Coronary stenting and atherosclerotic calcification. Aortic calcification. Reflux of contrast into the IVC and hepatic veins compatible with elevated right heart pressures. The main pulmonary artery is mildly dilated measuring 34 mm in maximum diameter. Mediastinum/Nodes: Unchanged 1.5 cm right hilar node. No mediastinal or axillary adenopathy. Unremarkable esophagus. Lungs/Pleura: Left pneumonectomy. Diffuse interlobular septal thickening. Patchy airspace opacities in the right upper lobe are new since 02/04/2023 (6/41 and 6/51). Unchanged spiculated right middle lobe mass measuring 2.0 cm (6/90). Fiducial markers are present adjacent to the mass. No pleural effusion or pneumothorax. Upper Abdomen: No acute abnormality.  Musculoskeletal: Postsurgical changes left ribs. No acute osseous findings. Review of the MIP images confirms the above findings. IMPRESSION: 1. No evidence of pulmonary embolism. 2. Patchy airspace opacities in the right upper lobe, new since 02/04/2023, concerning for pneumonia. Recommend follow-up chest CT in 3 months to ensure resolution. 3. Diffuse interlobular septal thickening throughout the right lung. Favor edema though infection or lymphangitic spread of carcinoma could appear similarly. 4. Unchanged spiculated right middle lobe mass measuring 2.0 cm. Unchanged 1.5 cm right hilar node. 5. Aortic atherosclerosis. Aortic Atherosclerosis (ICD10-I70.0). Electronically Signed   By: Minerva Fester M.D.   On: 02/28/2023 21:25   DG Chest 2 View  Result Date: 03/06/2023 CLINICAL DATA:  Shortness of breath  and chest pain EXAM: CHEST - 2 VIEW COMPARISON:  Chest x-ray dated February 08, 2023 FINDINGS: Stable findings of prior left pneumonectomy. Limited evaluation of the cardiac and mediastinal contours due to mediastinal shift. Stable left chest wall port. Increased interstitial opacities of the right hemithorax. No evidence of pleural effusion or pneumothorax. IMPRESSION: Increased interstitial opacities of the right hemithorax, concerning for pulmonary edema or infection. Electronically Signed   By: Allegra LaiLeah  Strickland M.D.   On: 2023/10/30 19:01    EKG: I independently viewed the EKG done and my findings are as followed: Sinus tachycardia rate of 101.  Nonspecific ST-T changes.  QTc 47.  Assessment/Plan Present on Admission:  Pneumonia  Principal Problem:   Pneumonia Right upper lobe pneumonia, POA Recent admission for right lower lobe pneumonia, discharged about a week ago Obtain MRSA screening test Start cefepime Bronchodilators Early mobilization as tolerated  Acute on chronic combined diastolic and systolic CHF Presented with elevated BNP greater than 1200 Right JVD on physical exam Recent 2D  echo done on 02/06/2023 showed LVEF 45 to 50% with grade 2 diastolic dysfunction Received a dose of IV Lasix in the ED 40 mg x 1. Start strict I's and O's and daily weight Obtain limited 2D echo and consult cardiology in the morning  Atypical chest pain Elevated troponin History of coronary artery disease status post PCI with stent placement Anterior chest wall tenderness with palpation on exam EDP discussed the case with cardiology, recommended monitoring for now Troponins 69, 89 Trend troponins Closely monitor on telemetry Resume home aspirin and Crestor.  Acute on chronic hypoxic respiratory failure secondary to pneumonia and heart failure exacerbation. At baseline on 1 L nasal cannula continuously Currently on 2-3 L to maintain O2 saturation greater than 92% Maintain O2 saturation above 92%  History of lung cancer status post left pneumonectomy Follows with Dr. Arbutus PedMohamed outpatient  Hypovolemic hyponatremia Serum sodium 134 Restrict fluid intake Repeat BMP in the morning  Isolated elevated T bilirubin To bilirubin 1.3 Rest of LFTs normal Monitor for now  Mild none anion gap metabolic acidosis Serum bicarb 20, anion gap 10 Appears to be a chronic issue, on p.o. sodium bicarbonate prior to admission Resume home p.o. sodium bicarbonate.  History of chronic hypotension on midodrine Resume home midodrine Maintain MAP greater than 65. Closely monitor vital signs    DVT prophylaxis: SQ Lovenox daily   Code Status: Full code   Family Communication: None at bedside   Disposition Plan: Admitted to stepdown unit   Consults called: None    Admission status: Inpatient status    Status is: Inpatient The patient requires at least 2 midnights for further evaluation and treatment of present condition.   Darlin Droparole N Kennley Schwandt MD [facility] Pager [phone]  If 7PM-7AM, please contact night-coverage [email or web address] Password [email or web address]  03/02/2023, 10:20 PM

## 2023-02-19 ENCOUNTER — Inpatient Hospital Stay (HOSPITAL_COMMUNITY): Payer: 59

## 2023-02-19 DIAGNOSIS — I509 Heart failure, unspecified: Secondary | ICD-10-CM

## 2023-02-19 DIAGNOSIS — J189 Pneumonia, unspecified organism: Secondary | ICD-10-CM | POA: Diagnosis not present

## 2023-02-19 DIAGNOSIS — Z515 Encounter for palliative care: Secondary | ICD-10-CM | POA: Diagnosis not present

## 2023-02-19 DIAGNOSIS — Z7189 Other specified counseling: Secondary | ICD-10-CM | POA: Diagnosis not present

## 2023-02-19 DIAGNOSIS — C3491 Malignant neoplasm of unspecified part of right bronchus or lung: Secondary | ICD-10-CM

## 2023-02-19 DIAGNOSIS — I5021 Acute systolic (congestive) heart failure: Secondary | ICD-10-CM | POA: Diagnosis not present

## 2023-02-19 LAB — COMPREHENSIVE METABOLIC PANEL
ALT: 41 U/L (ref 0–44)
AST: 33 U/L (ref 15–41)
Albumin: 3.8 g/dL (ref 3.5–5.0)
Alkaline Phosphatase: 93 U/L (ref 38–126)
Anion gap: 11 (ref 5–15)
BUN: 29 mg/dL — ABNORMAL HIGH (ref 8–23)
CO2: 24 mmol/L (ref 22–32)
Calcium: 8.5 mg/dL — ABNORMAL LOW (ref 8.9–10.3)
Chloride: 100 mmol/L (ref 98–111)
Creatinine, Ser: 1.34 mg/dL — ABNORMAL HIGH (ref 0.61–1.24)
GFR, Estimated: 59 mL/min — ABNORMAL LOW (ref >60)
Glucose, Bld: 82 mg/dL (ref 70–99)
Potassium: 3.5 mmol/L (ref 3.5–5.1)
Sodium: 135 mmol/L (ref 135–145)
Total Bilirubin: 1.2 mg/dL (ref 0.3–1.2)
Total Protein: 6.9 g/dL (ref 6.5–8.1)

## 2023-02-19 LAB — TROPONIN I (HIGH SENSITIVITY): Troponin I (High Sensitivity): 109 ng/L (ref <18)

## 2023-02-19 LAB — ECHOCARDIOGRAM LIMITED
Area-P 1/2: 3.27 cm2
Height: 60 in
S' Lateral: 3.5 cm
Single Plane A4C EF: 57 %
Weight: 2176.38 oz

## 2023-02-19 LAB — CBC
HCT: 40.8 % (ref 39.0–52.0)
Hemoglobin: 13.7 g/dL (ref 13.0–17.0)
MCH: 30.2 pg (ref 26.0–34.0)
MCHC: 33.6 g/dL (ref 30.0–36.0)
MCV: 89.9 fL (ref 80.0–100.0)
Platelets: 235 10*3/uL (ref 150–400)
RBC: 4.54 MIL/uL (ref 4.22–5.81)
RDW: 18.6 % — ABNORMAL HIGH (ref 11.5–15.5)
WBC: 8.2 10*3/uL (ref 4.0–10.5)
nRBC: 0 % (ref 0.0–0.2)

## 2023-02-19 LAB — MRSA NEXT GEN BY PCR, NASAL: MRSA by PCR Next Gen: NOT DETECTED

## 2023-02-19 LAB — MAGNESIUM: Magnesium: 2 mg/dL (ref 1.7–2.4)

## 2023-02-19 LAB — PHOSPHORUS: Phosphorus: 3.1 mg/dL (ref 2.5–4.6)

## 2023-02-19 LAB — TSH: TSH: 40.657 u[IU]/mL — ABNORMAL HIGH (ref 0.350–4.500)

## 2023-02-19 MED ORDER — MIDODRINE HCL 5 MG PO TABS
10.0000 mg | ORAL_TABLET | ORAL | Status: AC
  Administered 2023-02-19: 10 mg via ORAL
  Filled 2023-02-19: qty 2

## 2023-02-19 MED ORDER — IPRATROPIUM-ALBUTEROL 0.5-2.5 (3) MG/3ML IN SOLN
3.0000 mL | Freq: Two times a day (BID) | RESPIRATORY_TRACT | Status: DC
  Administered 2023-02-19 – 2023-02-21 (×4): 3 mL via RESPIRATORY_TRACT
  Filled 2023-02-19 (×4): qty 3

## 2023-02-19 MED ORDER — CHLORHEXIDINE GLUCONATE CLOTH 2 % EX PADS
6.0000 | MEDICATED_PAD | Freq: Every day | CUTANEOUS | Status: DC
  Administered 2023-02-19 – 2023-02-22 (×4): 6 via TOPICAL

## 2023-02-19 MED ORDER — ORAL CARE MOUTH RINSE: 15.0000 mL | OROMUCOSAL | Status: DC | PRN

## 2023-02-19 MED ORDER — LEVOTHYROXINE SODIUM 75 MCG PO TABS
75.0000 ug | ORAL_TABLET | Freq: Every day | ORAL | Status: DC
  Administered 2023-02-19 – 2023-02-21 (×3): 75 ug via ORAL
  Filled 2023-02-19 (×3): qty 1

## 2023-02-19 MED ORDER — ALBUMIN HUMAN 25 % IV SOLN
50.0000 g | Freq: Once | INTRAVENOUS | Status: AC
  Administered 2023-02-19: 50 g via INTRAVENOUS
  Filled 2023-02-19: qty 200

## 2023-02-19 MED ORDER — TAMSULOSIN HCL 0.4 MG PO CAPS
0.4000 mg | ORAL_CAPSULE | Freq: Every day | ORAL | Status: DC
  Administered 2023-02-19 – 2023-02-21 (×3): 0.4 mg via ORAL
  Filled 2023-02-19 (×3): qty 1

## 2023-02-19 NOTE — Progress Notes (Signed)
PROGRESS NOTE    Ronald [name]  WUJ:[id number] DOB: Apr 15, 1956 DOA: 03/07/2023 PCP: Estevan OaksBrown, Beverly Ann, NP    Brief Narrative:  Ronald EstimableMichael R Windhorst is a 67 y.o. male with medical history significant for chronic combined diastolic and systolic CHF, non-small cell lung cancer status post left pneumonectomy/chemotherapy/radiation, moderate mitral and tricuspid valves regurgitation, hyperlipidemia, recently admitted for right lower lobe pneumonia who presented to [facility] from home with complaints of chest pain and shortness of breath.  Onset of symptoms hours prior to presentation in the ED. associated with a productive cough with yellow sputum.  Denies dysphagia to solids or liquids.  Denies subjective fevers but admits to chills.   While in the ED, no leukocytosis, afebrile.  No evidence of acute ischemia on twelve-lead EKG.  High-sensitivity troponin mildly elevated.  EDP discussed the case with cardiology who recommended trending troponin and close monitoring for now.  Suspected demand ischemia at this time.  Chest x-ray and subsequent chest CT scan revealed right upper lobe infiltrates suggestive of pneumonia.  No evidence of pulmonary embolism on CT angio chest.   The patient was started on empiric IV antibiotics Rocephin and azithromycin in the ED.  TRH, hospitalist service, was asked to admit.     Assessment and Plan: Right upper lobe pneumonia, POA Recent admission for right lower lobe pneumonia, discharged about a week ago - cefepime Bronchodilators Early mobilization as tolerated -re-consult palliative care   Acute on chronic combined diastolic and systolic CHF Presented with elevated BNP greater than 1200 Recent 2D echo done on 02/06/2023 showed LVEF 45 to 50% with grade 2 diastolic dysfunction Received a dose of IV Lasix in the ED 40 mg x  with resultant low BP needing midodrine -strict I's and O's and daily weight  Atypical chest pain Elevated troponin History of coronary  artery disease status post PCI with stent placement Anterior chest wall tenderness with palpation on exam EDP discussed the case with cardiology, recommended monitoring for now Trend troponins Closely monitor on telemetry Resume home aspirin and Crestor.   Acute on chronic hypoxic respiratory failure secondary to pneumonia and heart failure exacerbation. At baseline on 1 L nasal cannula continuously Currently on 2-3 L to maintain O2 saturation greater than 92% Maintain O2 saturation above 92%   History of lung cancer status post left pneumonectomy Follows with Dr. Arbutus PedMohamed outpatient -added to treatment team   Hypovolemic hyponatremia -mild and resolved    History of chronic hypotension on midodrine Resume home midodrine Maintain MAP greater than 65. Closely monitor vital signs   Non-compliance with synthroid for hypothyroid -recheck TSH -resume synthroid    DVT prophylaxis: enoxaparin (LOVENOX) injection 40 mg Start: 06-Mar-2023 2230    Code Status: Full Code   Disposition Plan:  Level of care: Stepdown Status is: Inpatient Remains inpatient appropriate     Consultants:  Palliative care   Subjective: Just wants to sleep  Objective: Vitals:   02/19/23 0730 02/19/23 0749 02/19/23 0751 02/19/23 0800  BP:    (!) 94/53  Pulse:    76  Resp:    18  Temp: (!) 96.9 F (36.1 C)     TempSrc: Axillary     SpO2:  97% 96% 98%  Weight:      Height:        Intake/Output Summary (Last 24 hours) at 02/19/2023 1115 Last data filed at 02/19/2023 0800 Gross per 24 hour  Intake 1275.45 ml  Output 400 ml  Net 875.45 ml  Filed Weights   02/19/2023 1720 02/19/23 0200  Weight: 65.8 kg 61.7 kg    Examination:   General: Appearance:     Overweight male in no acute distress     Lungs:     Diminished, respirations unlabored  Heart:    Normal heart rate.    MS:   All extremities are intact.    Neurologic:   Awake       Data Reviewed: I have personally reviewed  following labs and imaging studies  CBC: Recent Labs  Lab 02/16/23 1013 02/15/2023 1813 02/19/23 0214  WBC 8.9 6.9 8.2  NEUTROABS 7.1 5.3  --   HGB 12.6* 14.0 13.7  HCT 36.6* 41.6 40.8  MCV 87.4 88.9 89.9  PLT 265 221 235   Basic Metabolic Panel: Recent Labs  Lab 02/16/23 1013 02/25/2023 1813 02/19/23 0214  NA 137 134* 135  K 3.7 4.0 3.5  CL 102 104 100  CO2 27 20* 24  GLUCOSE 98 77 82  BUN 35* 27* 29*  CREATININE 1.31* 1.18 1.34*  CALCIUM 8.8* 8.3* 8.5*  MG  --   --  2.0  PHOS  --   --  3.1   GFR: Estimated Creatinine Clearance: 42.5 mL/min (A) (by C-G formula based on SCr of 1.34 mg/dL (H)). Liver Function Tests: Recent Labs  Lab 02/16/23 1013 03/04/2023 1813 02/19/23 0214  AST 60* 38 33  ALT 62* 43 41  ALKPHOS 120 86 93  BILITOT 0.6 1.3* 1.2  PROT 7.1 6.6 6.9  ALBUMIN 3.9 3.7 3.8   No results for input(s): "LIPASE", "AMYLASE" in the last 168 hours. No results for input(s): "AMMONIA" in the last 168 hours. Coagulation Profile: No results for input(s): "INR", "PROTIME" in the last 168 hours. Cardiac Enzymes: No results for input(s): "CKTOTAL", "CKMB", "CKMBINDEX", "TROPONINI" in the last 168 hours. BNP (last 3 results) No results for input(s): "PROBNP" in the last 8760 hours. HbA1C: No results for input(s): "HGBA1C" in the last 72 hours. CBG: No results for input(s): "GLUCAP" in the last 168 hours. Lipid Profile: No results for input(s): "CHOL", "HDL", "LDLCALC", "TRIG", "CHOLHDL", "LDLDIRECT" in the last 72 hours. Thyroid Function Tests: No results for input(s): "TSH", "T4TOTAL", "FREET4", "T3FREE", "THYROIDAB" in the last 72 hours. Anemia Panel: No results for input(s): "VITAMINB12", "FOLATE", "FERRITIN", "TIBC", "IRON", "RETICCTPCT" in the last 72 hours. Sepsis Labs: No results for input(s): "PROCALCITON", "LATICACIDVEN" in the last 168 hours.  Recent Results (from the past 240 hour(s))  Resp panel by RT-PCR (RSV, Flu A&B, Covid) Anterior Nasal Swab      Status: None   Collection Time: 03/06/2023  6:26 PM   Specimen: Anterior Nasal Swab  Result Value Ref Range Status   SARS Coronavirus 2 by RT PCR NEGATIVE NEGATIVE Final    Comment: (NOTE) SARS-CoV-2 target nucleic acids are NOT DETECTED.  The SARS-CoV-2 RNA is generally detectable in upper respiratory specimens during the acute phase of infection. The lowest concentration of SARS-CoV-2 viral copies this assay can detect is 138 copies/mL. A negative result does not preclude SARS-Cov-2 infection and should not be used as the sole basis for treatment or other patient management decisions. A negative result may occur with  improper specimen collection/handling, submission of specimen other than nasopharyngeal swab, presence of viral mutation(s) within the areas targeted by this assay, and inadequate number of viral copies(<138 copies/mL). A negative result must be combined with clinical observations, patient history, and epidemiological information. The expected result is Negative.  Fact Sheet for  Patients:  BloggerCourse.com  Fact Sheet for Healthcare Providers:  SeriousBroker.it  This test is no t yet approved or cleared by the [place] FDA and  has been authorized for detection and/or diagnosis of SARS-CoV-2 by FDA under an Emergency Use Authorization (EUA). This EUA will remain  in effect (meaning this test can be used) for the duration of the COVID-19 declaration under Section 564(b)(1) of the Act, 21 U.S.C.section 360bbb-3(b)(1), unless the authorization is terminated  or revoked sooner.       Influenza A by PCR NEGATIVE NEGATIVE Final   Influenza B by PCR NEGATIVE NEGATIVE Final    Comment: (NOTE) The Xpert Xpress SARS-CoV-2/FLU/RSV plus assay is intended as an aid in the diagnosis of influenza from Nasopharyngeal swab specimens and should not be used as a sole basis for treatment. Nasal washings and aspirates are  unacceptable for Xpert Xpress SARS-CoV-2/FLU/RSV testing.  Fact Sheet for Patients: BloggerCourse.com  Fact Sheet for Healthcare Providers: SeriousBroker.it  This test is not yet approved or cleared by the [place] FDA and has been authorized for detection and/or diagnosis of SARS-CoV-2 by FDA under an Emergency Use Authorization (EUA). This EUA will remain in effect (meaning this test can be used) for the duration of the COVID-19 declaration under Section 564(b)(1) of the Act, 21 U.S.C. section 360bbb-3(b)(1), unless the authorization is terminated or revoked.     Resp Syncytial Virus by PCR NEGATIVE NEGATIVE Final    Comment: (NOTE) Fact Sheet for Patients: BloggerCourse.com  Fact Sheet for Healthcare Providers: SeriousBroker.it  This test is not yet approved or cleared by the [place] FDA and has been authorized for detection and/or diagnosis of SARS-CoV-2 by FDA under an Emergency Use Authorization (EUA). This EUA will remain in effect (meaning this test can be used) for the duration of the COVID-19 declaration under Section 564(b)(1) of the Act, 21 U.S.C. section 360bbb-3(b)(1), unless the authorization is terminated or revoked.  Performed at [facility], [address], [place], [place] [address]   MRSA Next Gen by PCR, Nasal     Status: None   Collection Time: 02/19/23  2:11 AM   Specimen: Nasal Mucosa; Nasal Swab  Result Value Ref Range Status   MRSA by PCR Next Gen NOT DETECTED NOT DETECTED Final    Comment: (NOTE) The GeneXpert MRSA Assay (FDA approved for NASAL specimens only), is one component of a comprehensive MRSA colonization surveillance program. It is not intended to diagnose MRSA infection nor to guide or monitor treatment for MRSA infections. Test performance is not FDA approved in patients less than 61 years old. Performed  at [facility], [address], [place], [place] [address]          Radiology Studies: CT Angio Chest PE W and/or Wo Contrast  Result Date: 02/24/2023 CLINICAL DATA:  Patient is here for evaluation of right chest/lung pain and shortness of breath. Stage IV lung cancer status post left pneumonectomy. EXAM: CT ANGIOGRAPHY CHEST WITH CONTRAST TECHNIQUE: Multidetector CT imaging of the chest was performed using the standard protocol during bolus administration of intravenous contrast. Multiplanar CT image reconstructions and MIPs were obtained to evaluate the vascular anatomy. RADIATION DOSE REDUCTION: This exam was performed according to the departmental dose-optimization program which includes automated exposure control, adjustment of the mA and/or kV according to patient size and/or use of iterative reconstruction technique. CONTRAST:  38mL OMNIPAQUE IOHEXOL 350 MG/ML SOLN COMPARISON:  Radiographs 02/22/2023 and CT chest 02/04/2023 FINDINGS: Cardiovascular: Satisfactory opacification  of the pulmonary arteries to the segmental level. No evidence of pulmonary embolism. Normal heart size. Trace pericardial effusion. Coronary stenting and atherosclerotic calcification. Aortic calcification. Reflux of contrast into the IVC and hepatic veins compatible with elevated right heart pressures. The main pulmonary artery is mildly dilated measuring 34 mm in maximum diameter. Mediastinum/Nodes: Unchanged 1.5 cm right hilar node. No mediastinal or axillary adenopathy. Unremarkable esophagus. Lungs/Pleura: Left pneumonectomy. Diffuse interlobular septal thickening. Patchy airspace opacities in the right upper lobe are new since 02/04/2023 (6/41 and 6/51). Unchanged spiculated right middle lobe mass measuring 2.0 cm (6/90). Fiducial markers are present adjacent to the mass. No pleural effusion or pneumothorax. Upper Abdomen: No acute abnormality. Musculoskeletal: Postsurgical changes left ribs. No acute  osseous findings. Review of the MIP images confirms the above findings. IMPRESSION: 1. No evidence of pulmonary embolism. 2. Patchy airspace opacities in the right upper lobe, new since 02/04/2023, concerning for pneumonia. Recommend follow-up chest CT in 3 months to ensure resolution. 3. Diffuse interlobular septal thickening throughout the right lung. Favor edema though infection or lymphangitic spread of carcinoma could appear similarly. 4. Unchanged spiculated right middle lobe mass measuring 2.0 cm. Unchanged 1.5 cm right hilar node. 5. Aortic atherosclerosis. Aortic Atherosclerosis (ICD10-I70.0). Electronically Signed   By: Minerva Fester M.D.   On: 03/04/2023 21:25   DG Chest 2 View  Result Date: 02/14/2023 CLINICAL DATA:  Shortness of breath and chest pain EXAM: CHEST - 2 VIEW COMPARISON:  Chest x-ray dated February 08, 2023 FINDINGS: Stable findings of prior left pneumonectomy. Limited evaluation of the cardiac and mediastinal contours due to mediastinal shift. Stable left chest wall port. Increased interstitial opacities of the right hemithorax. No evidence of pleural effusion or pneumothorax. IMPRESSION: Increased interstitial opacities of the right hemithorax, concerning for pulmonary edema or infection. Electronically Signed   By: Allegra Lai M.D.   On: 03/06/2023 19:01        Scheduled Meds:  aspirin EC  81 mg Oral Daily   Chlorhexidine Gluconate Cloth  6 each Topical Daily   enoxaparin (LOVENOX) injection  40 mg Subcutaneous Q24H   fluticasone furoate-vilanterol  1 puff Inhalation Daily   And   umeclidinium bromide  1 puff Inhalation Daily   ipratropium-albuterol  3 mL Nebulization BID   levothyroxine  75 mcg Oral Q0600   midodrine  5 mg Oral TID WC   montelukast  10 mg Oral QHS   polyethylene glycol  17 g Oral Daily   rosuvastatin  5 mg Oral Daily   senna-docusate  2 tablet Oral BID   sodium bicarbonate  650 mg Oral BID   Continuous Infusions:  azithromycin Stopped  (02/19/23 0656)   ceFEPime (MAXIPIME) IV Stopped (02/19/23 0650)     LOS: 1 day    Time spent: 45 minutes spent on chart review, discussion with nursing staff, consultants, updating family and interview/physical exam; more than 50% of that time was spent in counseling and/or coordination of care.    Joseph Art, DO [facility] Available via Epic secure chat 7am-7pm After these hours, please refer to coverage provider listed on [email or web address] 02/19/2023, 11:15 AM

## 2023-02-19 NOTE — Consult Note (Signed)
Palliative Care Consult Note                                  Date: 02/19/2023   Patient Name: Ronald [name]  DOB: 1956-07-19  MRN: [id number]  Age / Sex: 67 y.o., male  PCP: Estevan OaksBrown, Beverly Ann, NP Referring Physician: Joseph ArtVann, Jessica U, DO  Reason for Consultation: Establishing goals of care  HPI/Patient Profile: 67 y.o. male  with past medical history of chronic combined diastolic and systolic CHF, non-small cell lung cancer status post left pneumonectomy/chemotherapy/radiation, moderate mitral and tricuspid valves regurgitation, hyperlipidemia, recently admitted for right lower lobe pneumonia who [facility] from home with complaints of chest pain and shortness of breath. He was admitted on 02/16/2023 with right upper lobe pneumonia, and acute on chronic combined diastolic and systolic CHF, chest pain, acute on chronic hypoxic respiratory failure secondary to pneumonia and heart failure exacerbation, history of lung cancer status post left pneumonectomy, and others.   Of note the patient was previous seen by palliative medicine at his last admission.  At that time he requested full code, full scope of care and named his son Ronald [name] as surrogate.  PMT was consulted for GOC conversations.  Past Medical History:  Diagnosis Date   CHF (congestive heart failure)    COPD (chronic obstructive pulmonary disease)    Coronary artery disease    Hypertension    lung ca dx'd 2005   chemo/xrt comp 2005, lung ca   Myocardial infarction    Shortness of breath     Subjective:   This NP Wynne DustEric Tarrie Mcmichen reviewed medical records, received report from team, assessed the patient and then meet at the patient's bedside to discuss diagnosis, prognosis, GOC, EOL wishes disposition and options.  I met with the patient at bedside.  Also present were sisters Ronald [name] and Ronald [name].   Concept of Palliative Care was introduced as specialized medical care for people  and their families living with serious illness.  If focuses on providing relief from the symptoms and stress of a serious illness.  The goal is to improve quality of life for both the patient and the family. Values and goals of care important to patient and family were attempted to be elicited.  Created space and opportunity for patient  and family to explore thoughts and feelings regarding current medical situation   Natural trajectory and current clinical status were discussed. Questions and concerns addressed. Patient  encouraged to call with questions or concerns.    Patient/Family Understanding of Illness: Patient understands he has a history of lung cancer.  He came into the hospital because of worsening cough and "the pneumonia is back".  We had significant discussion about his chronic comorbidities and acute presentations.  He has a good understanding of his current situation.  Life Review: Per previous palliative care notes: "Spent time exploring what patient likes to do when he is feeling well.  Patient notes that he enjoys fishing and grilling.  Patient enjoys spending time with his grandchildren.  Patient notes he had 3 sons though 1 has passed away.  His son, Ronald [name], currently lives in [place] so he does not get to see him as much as he would like.  Patient currently lives with his sister and any [place]ie [place]."  Patient Values: Family, faith  Goals: "To do what needs to be done to help me live"  Today's Discussion: In addition to  discussion described above we had substantial discussion on various topics.  In addition reviewing his chronic comorbidities and acute presentations we talked about how he is feeling today.  He is having chest pain, although the oxycodone seems to help.  His nurse just recently gave him a dose and it is helping.  He does have some urinary retention and nursing tried in and out cath but met resistance.  They received an order for a Coude catheter.  He states his  breathing is doing well, does have intermittent cough.  Denies any other abdominal pain besides the chest pain.  Denies nausea and vomiting.  We had a discussion on CODE STATUS with the patient's sisters present.  The patient states he wants to be a full code to do what needs to be done for him to continue to survive.  We discussed his recent cancer treatment and he feels that he tolerates it well.  He wants to continue cancer treatments to try to beat the cancer again.  After further discussion he is elected full scope of care.  We discussed who he would want to be his decision-maker if he could not make decisions and he states he would want his Ronald [name], who he lives with, to be his Social research officer, government.  He agrees that this is different than previously discussed (son Ronald [name]) because his son lives in [place].  I recommended completion of HCPOA paperwork to get this legally enacted and he is agreeable.  I recommended outpatient palliative care.  This was previously recommended and referred to [facility] collective after last discharge.  However, notes intussusceptum indicate 2 attempts to schedule a visit without success.  Subsequently the patient was recently readmitted likely limiting the ability for palliative care to get involved.  The patient sister also expressed concern that home health care nursing never contacted them to get started or to come see the patient.  She is worried that if he does not have outpatient services that he could result in coming back to the hospital.  I told him I would pass the message on to [facility].  I provided emotional and general support through therapeutic listening, empathy, sharing of stories, and other techniques. I answered all questions and addressed all concerns to the best of my ability.  Review of Systems  Constitutional:  Positive for fatigue.  Respiratory:  Positive for cough. Negative for shortness of breath.   Cardiovascular:  Positive for chest  pain (improved with medications).  Gastrointestinal:  Negative for abdominal pain, nausea and vomiting.    Objective:   Primary Diagnoses: Present on Admission:  Pneumonia   Physical Exam Vitals and nursing note reviewed.  Constitutional:      General: He is not in acute distress.    Appearance: He is ill-appearing.  HENT:     Head: Normocephalic and atraumatic.  Cardiovascular:     Rate and Rhythm: Normal rate.  Pulmonary:     Effort: Pulmonary effort is normal. No respiratory distress.  Abdominal:     General: Abdomen is flat.  Skin:    General: Skin is warm and dry.  Neurological:     General: No focal deficit present.     Mental Status: He is alert and oriented to person, place, and time.  Psychiatric:        Mood and Affect: Mood normal.        Behavior: Behavior normal.     Vital Signs:  BP (!) 93/55   Pulse 74  Temp 98.3 F (36.8 C) (Oral)   Resp (!) 26   Ht 5' (1.524 m)   Wt 61.7 kg   SpO2 98%   BMI 26.57 kg/m   Palliative Assessment/Data: 40%    Advanced Care Planning:   Existing Vynca/ACP Documentation: None  Primary Decision Maker: PATIENT  Code Status/Advance Care Planning: Full code  A discussion was had today regarding advanced directives. Concepts specific to code status, artifical feeding and hydration, continued IV antibiotics and rehospitalization was had.  The difference between a aggressive medical intervention path and a palliative comfort care path for this patient at this time was had.   Decisions/Changes to ACP: Full Code, Full Scope of Care  Assessment & Plan:   Impression: 67 year old male with chronic comorbidities and acute presentations as described above.  He is admitted again for right upper lobe pneumonia and was recently discharged about a week ago after admission for right lower lobe pneumonia.  He is a bit handicapped and that he only has 1 lung remaining after pneumonectomy for cancer.  His cancer recently  returned in 2023 and he has undergone systemic chemotherapy and is now on Keytruda immunotherapy, appears to be tolerating well per him.  After discussion he wishes to remain a full code, full scope of care.  He wants to continue cancer treatments in an attempt to fight his cancer.  Overall prognosis is poor.  SUMMARY OF RECOMMENDATIONS   Remain full code Full scope of care Spiritual care consult for HCPOA completion Recommend reattempt at establishing with outpatient palliative care after discharge PMT will follow-up on Monday 4/8 Please notify us if significant clinical change or new palliative needs prior to then  Symptom Management:  Per primary team PMT is available to assist as needed  Prognosis:  Unable to determine  Discharge Planning:  To Be Determined   Discussed with: Patient, patient's family, medical team, nursing team, [facility] team    Thank you for allowing [place] to participate in the care of DESHANE COTRONEO PMT will continue to support holistically.  Time Total: 75 min  Greater than 50%  of this time was spent counseling and coordinating care related to the above assessment and plan.  Signed by: Wynne Dust, NP Palliative Medicine Team  Team Phone # [phone] (Nights/Weekends)  02/19/2023, 1:27 PM

## 2023-02-19 NOTE — Progress Notes (Signed)
    Patient Name: Ronald [name]           DOB: [birth year]  MRN: [id number]      Admission Date: 03/11/2023  Attending Provider: Darlin Drop, DO  Primary Diagnosis: Pneumonia   Level of care: Stepdown    CROSS COVER NOTE   Date of Service   02/19/2023   Ronald [name], 67 y.o. male, was admitted on 02/26/2023 for Pneumonia.    HPI/Events of Note   Bedside RN reports hypotensive episodes, BP 80/40 with MAP mid 50s.  Patient is alert and oriented x 2, following commands.  BNP elevated greater than 1200.  IV Lasix 40 mg x 1 was given in the ED.  Orders have been placed for albumin and oral midodrine.   Interventions/ Plan   IV albumin Midodrine         Anthoney Harada, DNP, [employer] [facility] [place]

## 2023-02-19 NOTE — Progress Notes (Signed)
Pt is A&O x 2 (person and place only), he is able to follow commands and can answer simple questions but unable to discuss pt med list at this time d/t being drowsy and forgetful.

## 2023-02-20 DIAGNOSIS — I509 Heart failure, unspecified: Secondary | ICD-10-CM | POA: Diagnosis not present

## 2023-02-20 DIAGNOSIS — J189 Pneumonia, unspecified organism: Secondary | ICD-10-CM | POA: Diagnosis not present

## 2023-02-20 LAB — BASIC METABOLIC PANEL
Anion gap: 7 (ref 5–15)
BUN: 28 mg/dL — ABNORMAL HIGH (ref 8–23)
CO2: 23 mmol/L (ref 22–32)
Calcium: 8.2 mg/dL — ABNORMAL LOW (ref 8.9–10.3)
Chloride: 102 mmol/L (ref 98–111)
Creatinine, Ser: 1.46 mg/dL — ABNORMAL HIGH (ref 0.61–1.24)
GFR, Estimated: 53 mL/min — ABNORMAL LOW (ref >60)
Glucose, Bld: 101 mg/dL — ABNORMAL HIGH (ref 70–99)
Potassium: 4 mmol/L (ref 3.5–5.1)
Sodium: 132 mmol/L — ABNORMAL LOW (ref 135–145)

## 2023-02-20 LAB — RESPIRATORY PANEL BY PCR

## 2023-02-20 LAB — CBC
HCT: 34.3 % — ABNORMAL LOW (ref 39.0–52.0)
Hemoglobin: 11.5 g/dL — ABNORMAL LOW (ref 13.0–17.0)
MCH: 30.3 pg (ref 26.0–34.0)
MCHC: 33.5 g/dL (ref 30.0–36.0)
MCV: 90.3 fL (ref 80.0–100.0)
Platelets: 177 10*3/uL (ref 150–400)
RBC: 3.8 MIL/uL — ABNORMAL LOW (ref 4.22–5.81)
RDW: 18 % — ABNORMAL HIGH (ref 11.5–15.5)
WBC: 5.6 10*3/uL (ref 4.0–10.5)
nRBC: 0 % (ref 0.0–0.2)

## 2023-02-20 LAB — PROCALCITONIN: Procalcitonin: 0.98 ng/mL

## 2023-02-20 MED ORDER — POLYVINYL ALCOHOL 1.4 % OP SOLN
1.0000 [drp] | OPHTHALMIC | Status: DC | PRN
  Filled 2023-02-20: qty 15

## 2023-02-20 MED ORDER — SODIUM CHLORIDE 0.9% FLUSH
10.0000 mL | Freq: Two times a day (BID) | INTRAVENOUS | Status: DC
  Administered 2023-02-20 – 2023-02-22 (×6): 10 mL

## 2023-02-20 MED ORDER — SODIUM CHLORIDE 0.9% FLUSH: 10.0000 mL | INTRAVENOUS | Status: DC | PRN

## 2023-02-20 MED ORDER — ALBUTEROL SULFATE (2.5 MG/3ML) 0.083% IN NEBU
2.5000 mg | INHALATION_SOLUTION | RESPIRATORY_TRACT | Status: DC | PRN
  Administered 2023-02-20 – 2023-02-22 (×4): 2.5 mg via RESPIRATORY_TRACT
  Filled 2023-02-20 (×3): qty 3

## 2023-02-20 MED ORDER — ALBUMIN HUMAN 25 % IV SOLN
50.0000 g | Freq: Once | INTRAVENOUS | Status: AC
  Administered 2023-02-20: 50 g via INTRAVENOUS
  Filled 2023-02-20: qty 200

## 2023-02-20 MED ORDER — ALBUMIN HUMAN 25 % IV SOLN
25.0000 g | Freq: Four times a day (QID) | INTRAVENOUS | Status: AC
  Administered 2023-02-20 – 2023-02-21 (×6): 25 g via INTRAVENOUS
  Filled 2023-02-20 (×6): qty 100

## 2023-02-20 MED ORDER — MIDODRINE HCL 5 MG PO TABS
5.0000 mg | ORAL_TABLET | ORAL | Status: AC
  Administered 2023-02-20: 5 mg via ORAL
  Filled 2023-02-20: qty 1

## 2023-02-20 NOTE — Progress Notes (Signed)
    Patient Name: Ronald [name]           DOB: 12/23/1955  MRN: [id number]      Admission Date: 02/20/2023  Attending Provider: Joseph Art, DO  Primary Diagnosis: Pneumonia   Level of care: Stepdown    CROSS COVER NOTE   Date of Service   02/20/2023   Ronald [name], 67 y.o. male, was admitted on 03/14/2023 for Pneumonia.    HPI/Events of Note   hypotension reported by bedside RN, BP 80/40 with MAP 50s.  Patient has a known history of chronic hypotension, takes oral midodrine at home.  Goal is to maintain MAP> 65, will place order for IV albumin and additional midodrine dose.  Nursing staff to report back if MAP<65.   Interventions/ Plan   IV albumin Additional midodrine dose        Anthoney Harada, DNP, [employer] [facility] [place]

## 2023-02-20 NOTE — Progress Notes (Signed)
PROGRESS NOTE    Ronald [name]  ZOX:[id number] DOB: 1956-05-03 DOA: 28-Feb-2023 PCP: Estevan Oaks, NP    Brief Narrative:  Ronald [name] is a 67 y.o. male with medical history significant for chronic combined diastolic and systolic CHF, non-small cell lung cancer status post left pneumonectomy/chemotherapy/radiation, moderate mitral and tricuspid valves regurgitation, hyperlipidemia, recently admitted for right lower lobe pneumonia who presented to [facility] ED from home with complaints of chest pain and shortness of breath.  Onset of symptoms hours prior to presentation in the ED. associated with a productive cough with yellow sputum.  Denies dysphagia to solids or liquids.  Denies subjective fevers but admits to chills.   While in the ED, no leukocytosis, afebrile.  No evidence of acute ischemia on twelve-lead EKG.  High-sensitivity troponin mildly elevated.  EDP discussed the case with cardiology who recommended trending troponin and close monitoring for now.  Suspected demand ischemia at this time.  Chest x-ray and subsequent chest CT scan revealed right upper lobe infiltrates suggestive of pneumonia.  No evidence of pulmonary embolism on CT angio chest.   The patient was started on empiric IV antibiotics Rocephin and azithromycin in the ED.  TR[facility], hospitalist service, was asked to admit.     Assessment and Plan: Right upper lobe pneumonia, POA Recent admission for right lower lobe pneumonia, discharged about a week ago with similar - cefepime Bronchodilators Early mobilization as tolerated -re-consult palliative care- -continue current management   Acute on chronic combined diastolic and systolic CHF Presented with elevated BNP greater than 1200 Recent 2D echo done on 02/06/2023 showed LVEF 45 to 50% with grade 2 diastolic dysfunction Received a dose of IV Lasix in the ED 40 mg x  with resultant low BP needing midodrine so hold on further diuresis -scheduled albumin for now for  BP support -strict I's and O's and daily weight  Atypical chest pain Elevated troponin History of coronary artery disease status post PCI with stent placement Anterior chest wall tenderness with palpation on exam EDP discussed the case with cardiology, recommended monitoring for now Trend troponins Closely monitor on telemetry Resume home aspirin and Crestor.   Acute on chronic hypoxic respiratory failure secondary to pneumonia and heart failure exacerbation. At baseline on 1 L nasal cannula continuously Currently on 2-3 L to maintain O2 saturation greater than 92% Maintain O2 saturation above 92%   History of lung cancer status post left pneumonectomy Follows with Dr. [name] outpatient -added to treatment team   Hypovolemic hyponatremia -mild and resolved    History of chronic hypotension on midodrine Resume home midodrine Maintain MAP greater than 65. Closely monitor vital signs   Non-compliance with synthroid for hypothyroid - TSH still elevated -resume synthroid    DVT prophylaxis: enoxaparin (LOVENOX) injection 40 mg Start: 02-28-2023 2230    Code Status: Full Code   Disposition Plan:  Level of care: Stepdown Status is: Inpatient Remains inpatient appropriate     Consultants:  Palliative care   Subjective: Feeling slightly better  Objective: Vitals:   02/20/23 0600 02/20/23 0700 02/20/23 0703 02/20/23 0800  BP: (!) 107/58  (!) 93/50 (!) 91/51  Pulse: 87 89 91 86  Resp: 20 (!) 21 (!) 24 (!) 21  Temp:    100.1 F (37.8 C)  TempSrc:    Axillary  SpO2: 94% 92% 91% 100%  Weight:      Height:        Intake/Output Summary (Last 24 hours) at 02/20/2023 1106 Last data  filed at 02/20/2023 0600 Gross per 24 hour  Intake 1022.24 ml  Output 855 ml  Net 167.24 ml   Filed Weights   02/25/2023 1720 02/19/23 0200 02/20/23 0141  Weight: 65.8 kg 61.7 kg 63.3 kg    Examination:    General: Appearance:     Overweight male in no acute distress     Lungs:      On [place], respirations unlabored, diminished  Heart:    Normal heart rate. Normal rhythm.    MS:   All extremities are intact.   Neurologic:   Awake, alert, irritable        Data Reviewed: I have personally reviewed following labs and imaging studies  CBC: Recent Labs  Lab 02/16/23 1013 03/05/2023 1813 02/19/23 0214 02/20/23 0603  WBC 8.9 6.9 8.2 5.6  NEUTROABS 7.1 5.3  --   --   HGB 12.6* 14.0 13.7 11.5*  HCT 36.6* 41.6 40.8 34.3*  MCV 87.4 88.9 89.9 90.3  PLT 265 221 235 177   Basic Metabolic Panel: Recent Labs  Lab 02/16/23 1013 02/22/2023 1813 02/19/23 0214 02/20/23 0603  NA 137 134* 135 132*  K 3.7 4.0 3.5 4.0  CL 102 104 100 102  CO2 27 20* 24 23  GLUCOSE 98 77 82 101*  BUN 35* 27* 29* 28*  CREATININE 1.31* 1.18 1.34* 1.46*  CALCIUM 8.8* 8.3* 8.5* 8.2*  MG  --   --  2.0  --   PHOS  --   --  3.1  --    GFR: Estimated Creatinine Clearance: 39.5 mL/min (A) (by C-G formula based on SCr of 1.46 mg/dL (H)). Liver Function Tests: Recent Labs  Lab 02/16/23 1013 03/13/2023 1813 02/19/23 0214  AST 60* 38 33  ALT 62* 43 41  ALKPHOS 120 86 93  BILITOT 0.6 1.3* 1.2  PROT 7.1 6.6 6.9  ALBUMIN 3.9 3.7 3.8   No results for input(s): "LIPASE", "AMYLASE" in the last 168 hours. No results for input(s): "AMMONIA" in the last 168 hours. Coagulation Profile: No results for input(s): "INR", "PROTIME" in the last 168 hours. Cardiac Enzymes: No results for input(s): "CKTOTAL", "CKMB", "CKMBINDEX", "TROPONINI" in the last 168 hours. BNP (last 3 results) No results for input(s): "PROBNP" in the last 8760 hours. HbA1C: No results for input(s): "HGBA1C" in the last 72 hours. CBG: No results for input(s): "GLUCAP" in the last 168 hours. Lipid Profile: No results for input(s): "CHOL", "HDL", "LDLCALC", "TRIG", "CHOLHDL", "LDLDIRECT" in the last 72 hours. Thyroid Function Tests: Recent Labs    02/19/23 1127  TSH 40.657*   Anemia Panel: No results for input(s):  "VITAMINB12", "FOLATE", "FERRITIN", "TIBC", "IRON", "RETICCTPCT" in the last 72 hours. Sepsis Labs: Recent Labs  Lab 02/20/23 0602  PROCALCITON 0.98    Recent Results (from the past 240 hour(s))  Resp panel by RT-PCR (RSV, Flu A&B, Covid) Anterior Nasal Swab     Status: None   Collection Time: 02/25/2023  6:26 PM   Specimen: Anterior Nasal Swab  Result Value Ref Range Status   SARS Coronavirus 2 by RT PCR NEGATIVE NEGATIVE Final    Comment: (NOTE) SARS-CoV-2 target nucleic acids are NOT DETECTED.  The SARS-CoV-2 RNA is generally detectable in upper respiratory specimens during the acute phase of infection. The lowest concentration of SARS-CoV-2 viral copies this assay can detect is 138 copies/mL. A negative result does not preclude SARS-Cov-2 infection and should not be used as the sole basis for treatment or other patient management decisions. A  negative result may occur with  improper specimen collection/handling, submission of specimen other than nasopharyngeal swab, presence of viral mutation(s) within the areas targeted by this assay, and inadequate number of viral copies(<138 copies/mL). A negative result must be combined with clinical observations, patient history, and epidemiological information. The expected result is Negative.  Fact Sheet for Patients:  BloggerCourse.com  Fact Sheet for Healthcare Providers:  SeriousBroker.it  This test is no t yet approved or cleared by the [place] FDA and  has been authorized for detection and/or diagnosis of SARS-CoV-2 by FDA under an Emergency Use Authorization (EUA). This EUA will remain  in effect (meaning this test can be used) for the duration of the COVID-19 declaration under Section 564(b)(1) of the Act, 21 U.S.C.section 360bbb-3(b)(1), unless the authorization is terminated  or revoked sooner.       Influenza A by PCR NEGATIVE NEGATIVE Final   Influenza B by PCR  NEGATIVE NEGATIVE Final    Comment: (NOTE) The Xpert Xpress SARS-CoV-2/FLU/RSV plus assay is intended as an aid in the diagnosis of influenza from Nasopharyngeal swab specimens and should not be used as a sole basis for treatment. Nasal washings and aspirates are unacceptable for Xpert Xpress SARS-CoV-2/FLU/RSV testing.  Fact Sheet for Patients: BloggerCourse.com  Fact Sheet for Healthcare Providers: SeriousBroker.it  This test is not yet approved or cleared by the [place] FDA and has been authorized for detection and/or diagnosis of SARS-CoV-2 by FDA under an Emergency Use Authorization (EUA). This EUA will remain in effect (meaning this test can be used) for the duration of the COVID-19 declaration under Section 564(b)(1) of the Act, 21 U.S.C. section 360bbb-3(b)(1), unless the authorization is terminated or revoked.     Resp Syncytial Virus by PCR NEGATIVE NEGATIVE Final    Comment: (NOTE) Fact Sheet for Patients: BloggerCourse.com  Fact Sheet for Healthcare Providers: SeriousBroker.it  This test is not yet approved or cleared by the [place] FDA and has been authorized for detection and/or diagnosis of SARS-CoV-2 by FDA under an Emergency Use Authorization (EUA). This EUA will remain in effect (meaning this test can be used) for the duration of the COVID-19 declaration under Section 564(b)(1) of the Act, 21 U.S.C. section 360bbb-3(b)(1), unless the authorization is terminated or revoked.  Performed at [facility], [address], [place], [place] [address]   MRSA Next Gen by PCR, Nasal     Status: None   Collection Time: 02/19/23  2:11 AM   Specimen: Nasal Mucosa; Nasal Swab  Result Value Ref Range Status   MRSA by PCR Next Gen NOT DETECTED NOT DETECTED Final    Comment: (NOTE) The GeneXpert MRSA Assay (FDA approved for NASAL specimens  only), is one component of a comprehensive MRSA colonization surveillance program. It is not intended to diagnose MRSA infection nor to guide or monitor treatment for MRSA infections. Test performance is not FDA approved in patients less than 47 years old. Performed at [facility], [address] [place], [place] [address]          Radiology Studies: ECHOCARDIOGRAM LIMITED  Result Date: 02/19/2023    ECHOCARDIOGRAM LIMITED REPORT   Patient Name:   Ronald [name] Date of Exam: 02/19/2023 Medical Rec #:  [id number]         Height:       60.0 in Accession #:    [id number]        Weight:       136.0 lb Date of  Birth:  October 31, 1956         BSA:          1.585 m Patient Age:    66 years          BP:           103/52 mmHg Patient Gender: M                 HR:           73 bpm. Exam Location:  Inpatient Procedure: Limited Echo, Limited Color Doppler and Cardiac Doppler Indications:    I50.21 CHF  History:        Patient has prior history of Echocardiogram examinations, most                 recent 02/06/2023. CHF, Previous Myocardial Infarction and CAD,                 COPD; Risk Factors:Hypertension.  Sonographer:    Dondra Prader RVT RCS Referring Phys: [id number] CAROLE N HALL IMPRESSIONS  1. Limited study; hypokinesis of the inferolateral wall; overall low normal LV function.  2. Left ventricular ejection fraction, by estimation, is 50 to 55%. The left ventricle has low normal function. The left ventricle demonstrates regional wall motion abnormalities (see scoring diagram/findings for description). Left ventricular diastolic  parameters are consistent with Grade II diastolic dysfunction (pseudonormalization). Elevated left atrial pressure.  3. Right ventricular systolic function is moderately reduced. The right ventricular size is moderately enlarged.  4. Left atrial size was moderately dilated.  5. Right atrial size was moderately dilated.  6. The mitral valve is normal in structure.  Moderate mitral valve regurgitation. No evidence of mitral stenosis.  7. Tricuspid valve regurgitation is moderate.  8. The aortic valve is tricuspid. Aortic valve regurgitation is mild. Aortic valve sclerosis is present, with no evidence of aortic valve stenosis.  9. The inferior vena cava is normal in size with greater than 50% respiratory variability, suggesting right atrial pressure of 3 mmHg. FINDINGS  Left Ventricle: Left ventricular ejection fraction, by estimation, is 50 to 55%. The left ventricle has low normal function. The left ventricle demonstrates regional wall motion abnormalities. The left ventricular internal cavity size was normal in size. There is no left ventricular hypertrophy. Left ventricular diastolic parameters are consistent with Grade II diastolic dysfunction (pseudonormalization). Elevated left atrial pressure. Right Ventricle: The right ventricular size is moderately enlarged. Right ventricular systolic function is moderately reduced. Left Atrium: Left atrial size was moderately dilated. Right Atrium: Right atrial size was moderately dilated. Pericardium: There is no evidence of pericardial effusion. Mitral Valve: The mitral valve is normal in structure. Moderate mitral valve regurgitation. No evidence of mitral valve stenosis. Tricuspid Valve: The tricuspid valve is normal in structure. Tricuspid valve regurgitation is moderate . No evidence of tricuspid stenosis. Aortic Valve: The aortic valve is tricuspid. Aortic valve regurgitation is mild. Aortic valve sclerosis is present, with no evidence of aortic valve stenosis. Pulmonic Valve: The pulmonic valve was grossly normal. Aorta: The aortic root is normal in size and structure. Venous: The inferior vena cava is normal in size with greater than 50% respiratory variability, suggesting right atrial pressure of 3 mmHg. Additional Comments: Limited study; hypokinesis of the inferolateral wall; overall low normal LV function.  LEFT VENTRICLE  PLAX 2D LVIDd:         4.90 cm     Diastology LVIDs:         3.50 cm  LV e' medial:    3.83 cm/s LV PW:         1.10 cm     LV E/e' medial:  26.4 LV IVS:        0.80 cm     LV e' lateral:   5.23 cm/s                            LV E/e' lateral: 19.3  LV Volumes (MOD) LV vol d, MOD A4C: 81.0 ml LV vol s, MOD A4C: 34.8 ml LV SV MOD A4C:     81.0 ml RIGHT VENTRICLE            IVC RV S prime:     7.90 cm/s  IVC diam: 1.40 cm TAPSE (M-mode): 1.8 cm LEFT ATRIUM         Index LA diam:    3.70 cm 2.34 cm/m  MITRAL VALVE                TRICUSPID VALVE MV Area (PHT): 3.27 cm     TR Peak grad:   38.9 mmHg MV Decel Time: 232 msec     TR Vmax:        312.00 cm/s MV E velocity: 101.00 cm/s MV A velocity: 89.20 cm/s MV E/A ratio:  1.13 Olga Millers MD Electronically signed by Olga Millers MD Signature Date/Time: 02/19/2023/12:30:59 PM    Final    CT Angio Chest PE W and/or Wo Contrast  Result Date: 02/25/2023 CLINICAL DATA:  Patient is here for evaluation of right chest/lung pain and shortness of breath. Stage IV lung cancer status post left pneumonectomy. EXAM: CT ANGIOGRAPHY CHEST WITH CONTRAST TECHNIQUE: Multidetector CT imaging of the chest was performed using the standard protocol during bolus administration of intravenous contrast. Multiplanar CT image reconstructions and MIPs were obtained to evaluate the vascular anatomy. RADIATION DOSE REDUCTION: This exam was performed according to the departmental dose-optimization program which includes automated exposure control, adjustment of the mA and/or kV according to patient size and/or use of iterative reconstruction technique. CONTRAST:  75mL OMNIPAQUE IOHEXOL 350 MG/ML SOLN COMPARISON:  Radiographs 02/24/2023 and CT chest 02/04/2023 FINDINGS: Cardiovascular: Satisfactory opacification of the pulmonary arteries to the segmental level. No evidence of pulmonary embolism. Normal heart size. Trace pericardial effusion. Coronary stenting and atherosclerotic calcification.  Aortic calcification. Reflux of contrast into the IVC and hepatic veins compatible with elevated right heart pressures. The main pulmonary artery is mildly dilated measuring 34 mm in maximum diameter. Mediastinum/Nodes: Unchanged 1.5 cm right hilar node. No mediastinal or axillary adenopathy. Unremarkable esophagus. Lungs/Pleura: Left pneumonectomy. Diffuse interlobular septal thickening. Patchy airspace opacities in the right upper lobe are new since 02/04/2023 (6/41 and 6/51). Unchanged spiculated right middle lobe mass measuring 2.0 cm (6/90). Fiducial markers are present adjacent to the mass. No pleural effusion or pneumothorax. Upper Abdomen: No acute abnormality. Musculoskeletal: Postsurgical changes left ribs. No acute osseous findings. Review of the MIP images confirms the above findings. IMPRESSION: 1. No evidence of pulmonary embolism. 2. Patchy airspace opacities in the right upper lobe, new since 02/04/2023, concerning for pneumonia. Recommend follow-up chest CT in 3 months to ensure resolution. 3. Diffuse interlobular septal thickening throughout the right lung. Favor edema though infection or lymphangitic spread of carcinoma could appear similarly. 4. Unchanged spiculated right middle lobe mass measuring 2.0 cm. Unchanged 1.5 cm right hilar node. 5. Aortic atherosclerosis. Aortic Atherosclerosis (ICD10-I70.0). Electronically Signed   By: Joselyn Glassman  Stutzman M.D.   On: 03/12/2023 21:25   DG Chest 2 View  Result Date: 03/01/2023 CLINICAL DATA:  Shortness of breath and chest pain EXAM: CHEST - 2 VIEW COMPARISON:  Chest x-ray dated February 08, 2023 FINDINGS: Stable findings of prior left pneumonectomy. Limited evaluation of the cardiac and mediastinal contours due to mediastinal shift. Stable left chest wall port. Increased interstitial opacities of the right hemithorax. No evidence of pleural effusion or pneumothorax. IMPRESSION: Increased interstitial opacities of the right hemithorax, concerning for  pulmonary edema or infection. Electronically Signed   By: Allegra Lai M.D.   On: 02/26/2023 19:01        Scheduled Meds:  aspirin EC  81 mg Oral Daily   Chlorhexidine Gluconate Cloth  6 each Topical Daily   enoxaparin (LOVENOX) injection  40 mg Subcutaneous Q24H   fluticasone furoate-vilanterol  1 puff Inhalation Daily   And   umeclidinium bromide  1 puff Inhalation Daily   ipratropium-albuterol  3 mL Nebulization BID   levothyroxine  75 mcg Oral Q0600   midodrine  5 mg Oral TID WC   montelukast  10 mg Oral QHS   polyethylene glycol  17 g Oral Daily   rosuvastatin  5 mg Oral Daily   senna-docusate  2 tablet Oral BID   sodium bicarbonate  650 mg Oral BID   sodium chloride flush  10-40 mL Intracatheter Q12H   tamsulosin  0.4 mg Oral Daily   Continuous Infusions:  albumin human 25 g (02/20/23 1004)   azithromycin Stopped (02/19/23 2258)   ceFEPime (MAXIPIME) IV Stopped (02/20/23 0441)     LOS: 2 days    Time spent: 45 minutes spent on chart review, discussion with nursing staff, consultants, updating family and interview/physical exam; more than 50% of that time was spent in counseling and/or coordination of care.    Joseph Art, DO [facility] Available via Epic secure chat 7am-7pm After these hours, please refer to coverage provider listed on [email or web address] 02/20/2023, 11:06 AM

## 2023-02-20 NOTE — Evaluation (Signed)
Physical Therapy Evaluation Patient Details Name: Ronald [name] MRN: [id number] DOB: 19-Nov-1955 Today's Date: 02/20/2023  History of Present Illness  67 year old male with history of  COPD (on 2L home O2), coronary artery disease, lung cancer status post left pneumonectomy/chemo/radiation who presented with SOB and dx with acute on chronic respiratory failure 2* ongoing PNA and heart failure exacerbation.  Clinical Impression  Pt admitted as above and presenting with functional mobility limitations 2* generalized weakness/deconditioning, balance deficits, and limited endurance.  Pt should progress to dc home with assist of family and would benefit from acute stay PT intervention and appropriate follow up PT post dc to maximize IND and safety.     Recommendations for follow up therapy are one component of a multi-disciplinary discharge planning process, led by the attending physician.  Recommendations may be updated based on patient status, additional functional criteria and insurance authorization.  Follow Up Recommendations       Assistance Recommended at Discharge Intermittent Supervision/Assistance  Patient can return home with the following  A little help with bathing/dressing/bathroom;Assist for transportation;Help with stairs or ramp for entrance;A little help with walking and/or transfers    Equipment Recommendations None recommended by PT  Recommendations for Other Services       Functional Status Assessment Patient has had a recent decline in their functional status and demonstrates the ability to make significant improvements in function in a reasonable and predictable amount of time.     Precautions / Restrictions Precautions Precautions: Fall Precaution Comments: monitor O2 Restrictions Weight Bearing Restrictions: No      Mobility  Bed Mobility Overal bed mobility: Needs Assistance Bed Mobility: Supine to Sit     Supine to sit: Min assist     General bed  mobility comments: min assist to bring trunk to upright    Transfers Overall transfer level: Needs assistance Equipment used: Rolling walker (2 wheels) Transfers: Sit to/from Stand Sit to Stand: Min guard           General transfer comment: steady assist only    Ambulation/Gait Ambulation/Gait assistance: Min guard Gait Distance (Feet): 50 Feet (twice) Assistive device: Rolling walker (2 wheels) Gait Pattern/deviations: Decreased stride length, Trunk flexed Gait velocity: decreased     General Gait Details: pt ambulated 39ft x2 with seated rest break between due to SOB/fatigue. Pt took x1 standing rest break during each boutof 4ft prior to seated rest.  Stairs            Wheelchair Mobility    Modified Rankin (Stroke Patients Only)       Balance Overall balance assessment: Needs assistance Sitting-balance support: Feet supported Sitting balance-Leahy Scale: Good     Standing balance support: Bilateral upper extremity supported, During functional activity Standing balance-Leahy Scale: Fair                               Pertinent Vitals/Pain Pain Assessment Pain Assessment: Faces Faces Pain Scale: Hurts little more Pain Location: low back pain Pain Descriptors / Indicators: Sore Pain Intervention(s): Limited activity within patient's tolerance, Monitored during session    Home Living Family/patient expects to be discharged to:: Private residence Living Arrangements: Other relatives Available Help at Discharge: Family;Available 24 hours/day Type of Home: Apartment Home Access: Level entry       Home Layout: One level Home Equipment: None (Pt states no equipment at home but PT note from hospital stay last month indicated rollator delivered to  room and adjusted to pt) Additional Comments: lives with sister who is home 24/7    Prior Function Prior Level of Function : Independent/Modified Independent                     Hand  Dominance   Dominant Hand: Right    Extremity/Trunk Assessment   Upper Extremity Assessment Upper Extremity Assessment: Generalized weakness    Lower Extremity Assessment Lower Extremity Assessment: Generalized weakness;RLE deficits/detail;LLE deficits/detail RLE Deficits / Details: 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 RLE Sensation: decreased light touch;history of peripheral neuropathy LLE Deficits / Details: 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 LLE Sensation: decreased light touch;history of peripheral neuropathy    Cervical / Trunk Assessment Cervical / Trunk Assessment: Normal  Communication   Communication: No difficulties  Cognition Arousal/Alertness: Awake/alert Behavior During Therapy: WFL for tasks assessed/performed Overall Cognitive Status: Within Functional Limits for tasks assessed                                          General Comments      Exercises     Assessment/Plan    PT Assessment Patient needs continued PT services  PT Problem List Decreased activity tolerance;Pain;Decreased balance       PT Treatment Interventions Functional mobility training;Therapeutic activities;Gait training;DME instruction;Therapeutic exercise;Balance training;Patient/family education    PT Goals (Current goals can be found in the Care Plan section)  Acute Rehab PT Goals Patient Stated Goal: Regain as much IND as possible PT Goal Formulation: With patient Time For Goal Achievement: 03/06/23 Potential to Achieve Goals: Good    Frequency Min 3X/week     Co-evaluation               AM-PAC PT "6 Clicks" Mobility  Outcome Measure Help needed turning from your back to your side while in a flat bed without using bedrails?: A Little Help needed moving from lying on your back to sitting on the side of a flat bed without using bedrails?: A Little Help needed moving to and from a bed to a chair (including a wheelchair)?: A Little Help  needed standing up from a chair using your arms (e.g., wheelchair or bedside chair)?: A Little Help needed to walk in hospital room?: A Little Help needed climbing 3-5 steps with a railing? : A Lot 6 Click Score: 17    End of Session Equipment Utilized During Treatment: Gait belt;Oxygen Activity Tolerance: Patient limited by fatigue Patient left: in chair;with call bell/phone within reach;with chair alarm set Nurse Communication: Mobility status PT Visit Diagnosis: Difficulty in walking, not elsewhere classified (R26.2)    Time: 1194-1740 PT Time Calculation (min) (ACUTE ONLY): 29 min   Charges:   PT Evaluation $PT Eval Low Complexity: 1 Low PT Treatments $Gait Training: 8-22 mins        Mauro Kaufmann PT Acute Rehabilitation Services Pager [phone] Office [phone]   Ronald [name] 02/20/2023, 4:56 PM

## 2023-02-21 ENCOUNTER — Inpatient Hospital Stay (HOSPITAL_COMMUNITY): Payer: 59

## 2023-02-21 DIAGNOSIS — J123 Human metapneumovirus pneumonia: Secondary | ICD-10-CM

## 2023-02-21 DIAGNOSIS — J189 Pneumonia, unspecified organism: Secondary | ICD-10-CM | POA: Diagnosis not present

## 2023-02-21 DIAGNOSIS — I509 Heart failure, unspecified: Secondary | ICD-10-CM | POA: Diagnosis not present

## 2023-02-21 DIAGNOSIS — Z515 Encounter for palliative care: Secondary | ICD-10-CM | POA: Diagnosis not present

## 2023-02-21 DIAGNOSIS — J441 Chronic obstructive pulmonary disease with (acute) exacerbation: Secondary | ICD-10-CM

## 2023-02-21 DIAGNOSIS — Z7189 Other specified counseling: Secondary | ICD-10-CM | POA: Diagnosis not present

## 2023-02-21 DIAGNOSIS — J9621 Acute and chronic respiratory failure with hypoxia: Secondary | ICD-10-CM | POA: Diagnosis not present

## 2023-02-21 MED ORDER — METHYLPREDNISOLONE SODIUM SUCC 125 MG IJ SOLR
125.0000 mg | Freq: Every day | INTRAMUSCULAR | Status: DC
  Administered 2023-02-21: 125 mg via INTRAVENOUS
  Filled 2023-02-21: qty 2

## 2023-02-21 MED ORDER — FUROSEMIDE 10 MG/ML IJ SOLN: 40.0000 mg | Freq: Once | INTRAMUSCULAR | Status: AC

## 2023-02-21 MED ORDER — IPRATROPIUM-ALBUTEROL 0.5-2.5 (3) MG/3ML IN SOLN: 3.0000 mL | Freq: Three times a day (TID) | RESPIRATORY_TRACT | Status: DC

## 2023-02-21 MED ORDER — SODIUM CHLORIDE 0.9 % IV SOLN
500.0000 mg | INTRAVENOUS | Status: DC
  Administered 2023-02-21 – 2023-02-22 (×2): 500 mg via INTRAVENOUS
  Filled 2023-02-21 (×2): qty 5

## 2023-02-21 MED ORDER — REVEFENACIN 175 MCG/3ML IN SOLN
175.0000 ug | Freq: Every day | RESPIRATORY_TRACT | Status: DC
  Administered 2023-02-22: 175 ug via RESPIRATORY_TRACT

## 2023-02-21 MED ORDER — ARFORMOTEROL TARTRATE 15 MCG/2ML IN NEBU
15.0000 ug | INHALATION_SOLUTION | Freq: Two times a day (BID) | RESPIRATORY_TRACT | Status: DC
  Administered 2023-02-21 – 2023-02-22 (×3): 15 ug via RESPIRATORY_TRACT
  Filled 2023-02-21 (×4): qty 2

## 2023-02-21 MED ORDER — BUDESONIDE 0.5 MG/2ML IN SUSP
0.5000 mg | Freq: Two times a day (BID) | RESPIRATORY_TRACT | Status: DC
  Administered 2023-02-21 – 2023-02-22 (×3): 0.5 mg via RESPIRATORY_TRACT
  Filled 2023-02-21 (×4): qty 2

## 2023-02-21 MED ORDER — FUROSEMIDE 10 MG/ML IJ SOLN
INTRAMUSCULAR | Status: AC
  Administered 2023-02-21: 40 mg via INTRAVENOUS
  Filled 2023-02-21: qty 4

## 2023-02-21 MED ORDER — METHYLPREDNISOLONE SODIUM SUCC 40 MG IJ SOLR
40.0000 mg | Freq: Two times a day (BID) | INTRAMUSCULAR | Status: DC
  Administered 2023-02-22 (×2): 40 mg via INTRAVENOUS
  Filled 2023-02-21 (×2): qty 1

## 2023-02-21 MED ORDER — LORAZEPAM 2 MG/ML IJ SOLN
INTRAMUSCULAR | Status: AC
  Filled 2023-02-21: qty 1

## 2023-02-21 MED ORDER — LORAZEPAM 2 MG/ML IJ SOLN
2.0000 mg | Freq: Once | INTRAMUSCULAR | Status: AC
  Administered 2023-02-21: 2 mg via INTRAVENOUS

## 2023-02-21 MED ORDER — LORAZEPAM 2 MG/ML IJ SOLN
2.0000 mg | INTRAMUSCULAR | Status: DC | PRN
  Administered 2023-02-21 – 2023-02-23 (×4): 2 mg via INTRAVENOUS
  Filled 2023-02-21 (×4): qty 1

## 2023-02-21 MED ORDER — AZITHROMYCIN 250 MG PO TABS: 500.0000 mg | ORAL_TABLET | Freq: Every day | ORAL | Status: DC

## 2023-02-21 NOTE — Progress Notes (Signed)
Pharmacy Antibiotic Note  Ronald [name] is a 67 y.o. male admitted on 03/02/2023 with evaluation of right chest pain/lung pain. Reports that he has been short of breath X 1 hour. Diagnosed with stage 4 lung cancer and lost entire left lung .  Pharmacy has been consulted to dose cefepime for HCAP 02/21/2023 Day #4 azithromycin  Day #3 cefepime 4/7 WBC 5.6, SCr 1.46, PCT 0.98 RVP + for metapneumovirus   Plan: Continue Cefepime 2gm IV q12h- ? De-escalate to PO? Azithromycin IV > PO per protocol Follow renal function, cultures and clinical course  Height: 5' (152.4 cm) Weight: 58.5 kg (128 lb 15.5 oz) IBW/kg (Calculated) : 50  Temp (24hrs), Avg:98.7 F (37.1 C), Min:98 F (36.7 C), Max:99.6 F (37.6 C)  Recent Labs  Lab 02/16/23 1013 03/03/2023 1813 02/19/23 0214 02/20/23 0603  WBC 8.9 6.9 8.2 5.6  CREATININE 1.31* 1.18 1.34* 1.46*     Estimated Creatinine Clearance: 35.7 mL/min (A) (by C-G formula based on SCr of 1.46 mg/dL (H)).    Allergies  Allergen Reactions   Penicillins Itching   Antimicrobials this admission:  4/5 CTX x 1 4/5 Azith >> 4/6 Cefepime >>  Dose adjustments this admission:   Microbiology results:  4/6 MRSA PCR: neg 4/7 RVP: + metapneumovirus   Thank you for allowing pharmacy to be a part of this patient's care.   Herby Abraham, Pharm.D Use secure chat for questions 02/21/2023 10:21 AM

## 2023-02-21 NOTE — Consult Note (Signed)
NAME:  Ronald [name], MRN:  [id number], DOB:  Feb 05, 1956, LOS: 3 ADMISSION DATE:  03/07/2023, CONSULTATION DATE:  02/21/23 REFERRING MD:  Dr. Benjamine Mola, CHIEF COMPLAINT:  Agitation, SOB   History of Present Illness:  67 y/o M who presented to [facility] on 4/5 with reports of shortness of breath and chest pain.    Patient was recently admitted from 3/22-3/28 in the setting of wheezing and productive cough.  Viral workup at that time was negative.  CTA of the chest showed emphysematous changes, possible infiltrate in the right lower lobe and stable right middle lobe spiculated nodule.  He was admitted and treated for community-acquired pneumonia with ceftriaxone and azithromycin.  Palliative care was consulted during that hospitalization.  He had a new reduction in his ejection fraction to 45 to 50%.  Cardiology was consulted.  Home medications held due to soft blood pressures.  On presentation the patient reported rather acute onset of chest pain and shortness of breath.  He reported associated productive cough with yellow sputum.  Initial cardiac workup with a negative EKG, high-sensitivity troponin mildly elevated.  Elevated troponin was thought secondary to demand ischemia in the setting of respiratory distress.  CXR 4/5 showed increased interstitial opacities in the right hemithorax concerning for edema versus infection.  Subsequent CT angio chest was negative for PE but demonstrated patchy airspace opacities in the right upper lobe new since 3/22 exam, diffuse interlobular septal thickening throughout the right lung, unchanged spiculated right middle lobe mass measuring 2 cm, and right hilar node measuring 1.5 cm.  Patient was admitted per [facility] and placed on empiric antibiotics with Rocephin and azithromycin.  He was positive for meta-pneumovirus. Hospital course complicated by hypotension in the setting of diuresis.  He required albumin and midodrine.  On 4/8 the patient looked well and was  pending transfer to medical floor.  He received a bath while in stepdown and subsequently became agitated and had respiratory distress.  He was given IV Ativan, Lasix and IV steroids for respiratory distress and wheezing.  PCCM consulted for evaluation.   Pertinent  Medical History  Tobacco abuse  COPD  Non-small cell lung cancer -Dx 2005, s/p chemo & XRT, left pneumonectomy.  Followed by Dr. [name]. Vocal cord surgery CAD s/p MI Moderate mitral and tricuspid valve regurgitation HLD HTN CHF  Significant Hospital Events: Including procedures, antibiotic start and stop dates in addition to other pertinent events   4/5 admitted with shortness of breath 4/8 agitated and hypoxic with upper airway wheezing, PCCM consulted   Interim History / Subjective:  RN reports pt became agitated, hypoxic during bath Treated with IV ativan, lasix and steroids per [facility]  Objective   Blood pressure 107/64, pulse 97, temperature 97.8 F (36.6 C), temperature source Oral, resp. rate (!) 23, height 5' (1.524 m), weight 58.5 kg, SpO2 95 %.        Intake/Output Summary (Last 24 hours) at 02/21/2023 1739 Last data filed at 02/21/2023 0540 Gross per 24 hour  Intake 548.06 ml  Output 225 ml  Net 323.06 ml   Filed Weights   02/19/23 0200 02/20/23 0141 02/21/23 0500  Weight: 61.7 kg 63.3 kg 58.5 kg    Examination: General: chronically ill appearing adult male lying in bed HENT: MM pink/dry, [place] O2, anicteric Lungs: dyspnea / abd accessory muscle use, upper airway wheezing with referred wheeze, coarse rhonchi on right  Cardiovascular: s1s2 RRR, difficult to assess for murmur due to adventitious breath sounds Abdomen: non-distended,  bsx4 active  Extremities: warm/dry, no edema  Neuro: drowsy after ativan but awakens to voice, follows commands    Resolved Hospital Problem list      Assessment & Plan:   Acute Respiratory Failure with Hypoxia  Intralobular Septal Thickening  Suspect respiratory  failure in setting of metapneumovirus and recent CAP. However, must also consider pneumonitis from [facility], excess volume, COPD & lymphangitic spread of cancer.  -now BiPAP -note hx of L Pneumectomy, recommended Vt 4-6 cc/kg  -lasix x1 now  -ativan x1 now -IV steroids x1 now > suspect wheezing is upper airway  -discontinue Breo, change to brovana, pulmicort, yupelri  -continue singulari -concerned he is high risk respiratory decompensation, at risk intubation.  He would likely be difficult to liberate from mechanical ventilation given his surgical history  RUL PNA (POA) Metapneumovirus Positive (POA) Discharged 3/28 for same  -continue cefepime, azithromycin per [facility]  -pulmonary hygiene -IS, mobilize   Stage IV Lung Cancer s/p L Pneumectomy  RUL Spiculated Mass Reportedly in remission after pneumonectomy for 19 years, then recurrent disease in 2023 -follows with Dr. 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 ECHO 3/24 with LVEF 45-50%, G2DD -continue albumin  -continue midodrine  -follow hemodynamics in SDU   Atypical Chest Pain  Demand Ischemia  Hx CAD, s/p MI with PCI + Stent  MVR/TVR -continue ASA, crestor  -not a candidate for valvular surgery per [facility]   Hypothyroidism with Non-Compliance  -Synthroid per [facility]  Best Practice (right click and "Reselect all SmartList Selections" daily)  Per [facility]   Labs   CBC: Recent Labs  Lab 02/16/23 1013 02/19/2023 1813 02/19/23 0214 02/20/23 0603  WBC 8.9 6.9 8.2 5.6  NEUTROABS 7.1 5.3  --   --   HGB 12.6* 14.0 13.7 11.5*  HCT 36.6* 41.6 40.8 34.3*  MCV 87.4 88.9 89.9 90.3  PLT 265 221 235 177    Basic Metabolic Panel: Recent Labs  Lab 02/16/23 1013 02/26/2023 1813 02/19/23 0214 02/20/23 0603  NA 137 134* 135 132*  K 3.7 4.0 3.5 4.0  CL 102 104 100 102  CO2 27 20* 24 23  GLUCOSE  98 77 82 101*  BUN 35* 27* 29* 28*  CREATININE 1.31* 1.18 1.34* 1.46*  CALCIUM 8.8* 8.3* 8.5* 8.2*  MG  --   --  2.0  --   PHOS  --   --  3.1  --    GFR: Estimated Creatinine Clearance: 35.7 mL/min (A) (by C-G formula based on SCr of 1.46 mg/dL (H)). Recent Labs  Lab 02/16/23 1013 02/20/2023 1813 02/19/23 0214 02/20/23 0602 02/20/23 0603  PROCALCITON  --   --   --  0.98  --   WBC 8.9 6.9 8.2  --  5.6    Liver Function Tests: Recent Labs  Lab 02/16/23 1013 03/02/2023 1813 02/19/23 0214  AST 60* 38 33  ALT 62* 43 41  ALKPHOS 120 86 93  BILITOT 0.6 1.3* 1.2  PROT 7.1 6.6 6.9  ALBUMIN 3.9 3.7 3.8   No results for input(s): "LIPASE", "AMYLASE" in the last 168 hours. No results for input(s): "AMMONIA" in the last 168 hours.  ABG    Component Value Date/Time   PHART 7.414 04/06/2012 0120   PCO2ART 32.2 (L) 04/06/2012 0120   PO2ART 72.4 (L) 04/06/2012 0120   HCO3 27.2 12/27/2022 1109  TCO2 24 12/29/2017 2300   ACIDBASEDEF 1.6 04/30/2015 2235   O2SAT 76.9 12/27/2022 1109     Coagulation Profile: No results for input(s): "INR", "PROTIME" in the last 168 hours.  Cardiac Enzymes: No results for input(s): "CKTOTAL", "CKMB", "CKMBINDEX", "TROPONINI" in the last 168 hours.  HbA1C: No results found for: "HGBA1C"  CBG: No results for input(s): "GLUCAP" in the last 168 hours.  Review of Systems:   Unable to complete as patient is on bipap. Information obtained from prior medical documentaion and staff at bedside.   Past Medical History:  He,  has a past medical history of CHF (congestive heart failure), COPD (chronic obstructive pulmonary disease), Coronary artery disease, Hypertension, lung ca (dx'd 2005), Myocardial infarction, and Shortness of breath.   Surgical History:   Past Surgical History:  Procedure Laterality Date   CARDIAC CATHETERIZATION N/A 10/13/2015   Procedure: Left Heart Cath and Coronary Angiography;  Surgeon: Rinaldo Cloud, MD;  Location: [facility]  INVASIVE CV LAB;  Service: Cardiovascular;  Laterality: N/A;   CORONARY ANGIOPLASTY WITH STENT PLACEMENT     LEFT HEART CATH AND CORONARY ANGIOGRAPHY N/A 01/03/2018   Procedure: LEFT HEART CATH AND CORONARY ANGIOGRAPHY;  Surgeon: Rinaldo Cloud, MD;  Location: [facility] INVASIVE CV LAB;  Service: Cardiovascular;  Laterality: N/A;   LEFT HEART CATHETERIZATION WITH CORONARY ANGIOGRAM N/A 04/07/2012   Procedure: LEFT HEART CATHETERIZATION WITH CORONARY ANGIOGRAM;  Surgeon: Robynn Pane, MD;  Location: [facility] CATH LAB;  Service: Cardiovascular;  Laterality: N/A;   PNEUMONECTOMY  2005   left   vocal cord surgery  2005     Social History:   reports that he quit smoking about 9 years ago. His smoking use included cigarettes. He has never used smokeless tobacco. He reports that he does not drink alcohol and does not use drugs.   Family History:  His family history includes Diabetes in an other family member; Hypertension in an other family member.   Allergies Allergies  Allergen Reactions   Penicillins Itching     Home Medications  Prior to Admission medications   Medication Sig Start Date End Date Taking? Authorizing Provider  acetaminophen (TYLENOL) 500 MG tablet Take 2 tablets (1,000 mg total) by mouth every 8 (eight) hours as needed. Patient taking differently: Take 1,000 mg by mouth every 8 (eight) hours as needed for mild pain or headache. 02/10/23  Yes Adhikari, Willia Craze, MD  albuterol (PROVENTIL HFA;VENTOLIN HFA) 108 (90 BASE) MCG/ACT inhaler Inhale 2 puffs into the lungs every 4 (four) hours as needed for wheezing or shortness of breath. 10/14/13  Yes Ward, Layla Maw, DO  aspirin EC 81 MG tablet Take 81 mg by mouth daily. Swallow whole.   Yes [provider]  celecoxib (CELEBREX) 200 MG capsule Take 200 mg by mouth daily as needed (for pain).   Yes [provider]  dicyclomine (BENTYL) 10 MG capsule Take 10 mg by mouth every 6 (six) hours as needed (for abdominal pain).   Yes  [provider]  furosemide (LASIX) 40 MG tablet Take 40 mg by mouth 2 (two) times daily.   Yes [provider]  gabapentin (NEURONTIN) 300 MG capsule Take 300 mg by mouth 3 (three) times daily.   Yes [provider]  hydrochlorothiazide (HYDRODIURIL) 25 MG tablet Take 25 mg by mouth daily.   Yes [provider]  levothyroxine (SYNTHROID) 75 MCG tablet Take 75 mcg by mouth daily before breakfast. 01/19/23  Yes [provider]  lidocaine-prilocaine (EMLA) cream Apply  1 Application topically as needed (To Port-a-Cath). 12/10/22  Yes Si GaulMohamed, Mohamed, MD  midodrine (PROAMATINE) 5 MG tablet Take 1 tablet (5 mg total) by mouth 3 (three) times daily with meals. 02/10/23  Yes Burnadette PopAdhikari, Amrit, MD  nitroGLYCERIN (NITROSTAT) 0.4 MG SL tablet Place 1 tablet (0.4 mg total) under the tongue every 5 (five) minutes x 3 doses as needed for chest pain. 11/01/13  Yes Rinaldo CloudHarwani, Mohan, MD  Oxycodone HCl 10 MG TABS Take 1 tablet (10 mg total) by mouth every 6 (six) hours as needed for moderate pain or severe pain (shortness of breath). 02/10/23  Yes Burnadette PopAdhikari, Amrit, MD  OXYGEN Inhale 1-2 L/min into the lungs continuous.   Yes [provider]  polyethylene glycol (MIRALAX / GLYCOLAX) 17 g packet Take 17 g by mouth daily. 02/11/23  Yes Burnadette PopAdhikari, Amrit, MD  ramipril (ALTACE) 2.5 MG capsule Take 2.5 mg by mouth daily.   Yes [provider]  rosuvastatin (CRESTOR) 5 MG tablet Take 5 mg by mouth at bedtime. 12/03/22  Yes [provider]  Dwyane LuoRELEGY ELLIPTA 200-62.5-25 MCG/ACT AEPB Take 1 puff by mouth daily. 12/02/22  Yes [provider]  zolpidem (AMBIEN) 10 MG tablet Take 10 mg by mouth at bedtime. 01/21/23  Yes [provider]  lidocaine (LIDODERM) 5 % Place 1 patch onto the skin daily. Remove & Discard patch within 12 hours or as directed by MD Patient not taking: Reported on 02/19/2023 02/11/23   Burnadette PopAdhikari, Amrit, MD  LORazepam (ATIVAN) 1 MG tablet  Take 1 tablet (1 mg total) by mouth 2 (two) times daily as needed for anxiety. 12/27/22   Sloan LeiterGray, Samuel A, DO  montelukast (SINGULAIR) 10 MG tablet Take 1 tablet (10 mg total) by mouth at bedtime. 05/02/15   Withrow, Everardo AllJohn C, FNP  senna-docusate (SENOKOT-S) 8.6-50 MG tablet Take 2 tablets by mouth 2 (two) times daily. Patient not taking: Reported on 02/19/2023 02/10/23   Burnadette PopAdhikari, Amrit, MD  sodium bicarbonate 650 MG tablet Take 1 tablet (650 mg total) by mouth 2 (two) times daily. 02/10/23   Burnadette PopAdhikari, Amrit, MD     Critical care time: 34 minutes    Canary BrimBrandi Ricard Faulkner, MSN, APRN, NP-C, AGACNP-BC [facility] 02/21/2023, 5:39 PM   Please see [email or web address] for pager details.   From 7A-7P if no response, please call [phone] After hours, please call ELink [phone]

## 2023-02-21 NOTE — Progress Notes (Signed)
PROGRESS NOTE    Ronald [name]  WUJ:[id number] DOB: January 27, 1956 DOA: 03/07/2023 PCP: Estevan Oaks, NP    Brief Narrative:  Ronald [name] is a 67 y.o. male with medical history significant for chronic combined diastolic and systolic CHF, non-small cell lung cancer status post left pneumonectomy/chemotherapy/radiation, moderate mitral and tricuspid valves regurgitation, hyperlipidemia, recently admitted for right lower lobe pneumonia who presented to [facility] ED from home with complaints of chest pain and shortness of breath.  Onset of symptoms hours prior to presentation in the ED. associated with a productive cough with yellow sputum.  Denies dysphagia to solids or liquids.  Denies subjective fevers but admits to chills.   While in the ED, no leukocytosis, afebrile.  No evidence of acute ischemia on twelve-lead EKG.  High-sensitivity troponin mildly elevated.  EDP discussed the case with cardiology who recommended trending troponin and close monitoring for now.  Suspected demand ischemia at this time.  Chest x-ray and subsequent chest CT scan revealed right upper lobe infiltrates suggestive of pneumonia.  No evidence of pulmonary embolism on CT angio chest.   The patient was started on empiric IV antibiotics Rocephin and azithromycin in the ED.  TRH, hospitalist service, was asked to admit.   Slow to improved, +meta pneumovirus  Assessment and Plan: Right upper lobe pneumonia, POA as well as metapneumovirus Recent admission for right lower lobe pneumonia, discharged about a week ago with similar - cefepime Bronchodilators Early mobilization as tolerated -re-consult palliative care- -continue current management for now -droplet/contact precuations   Acute on chronic combined diastolic and systolic CHF Presented with elevated BNP greater than 1200 Recent 2D echo done on 02/06/2023 showed LVEF 45 to 50% with grade 2 diastolic dysfunction Received a dose of IV Lasix in the ED 40 mg x   with resultant low BP needing midodrine so hold on further diuresis -scheduled albumin for now for BP support -strict I's and O's and daily weight  Atypical chest pain Elevated troponin History of coronary artery disease status post PCI with stent placement Anterior chest wall tenderness with palpation on exam EDP discussed the case with cardiology, recommended monitoring for now Closely monitor on telemetry Resume home aspirin and Crestor.   Acute on chronic hypoxic respiratory failure secondary to pneumonia and heart failure exacerbation. At baseline on 1 L nasal cannula continuously Currently on 2-3 L to maintain O2 saturation greater than 92% Maintain O2 saturation above 92%   History of lung cancer status post left pneumonectomy Follows with Dr. [name] outpatient -added to treatment team   Hypovolemic hyponatremia -mild and resolved    History of chronic hypotension on midodrine Resume home midodrine Maintain MAP greater than 65. Closely monitor vital signs   Non-compliance with synthroid for hypothyroid - TSH still elevated -resume synthroid    DVT prophylaxis: enoxaparin (LOVENOX) injection 40 mg Start: 03/01/2023 2230    Code Status: Full Code   Disposition Plan:  Level of care: Stepdown Status is: Inpatient Remains inpatient appropriate     Consultants:  Palliative care   Subjective: No voiced complaints-- wet sounding cough  Objective: Vitals:   02/21/23 0731 02/21/23 0800 02/21/23 0837 02/21/23 0900  BP:  103/60  (!) 107/55  Pulse:  90  87  Resp:  (!) 21  20  Temp: 98.5 F (36.9 C)     TempSrc: Oral     SpO2:  95% 98% 96%  Weight:      Height:        Intake/Output  Summary (Last 24 hours) at 02/21/2023 1006 Last data filed at 02/21/2023 0540 Gross per 24 hour  Intake 759.39 ml  Output 575 ml  Net 184.39 ml   Filed Weights   02/19/23 0200 02/20/23 0141 02/21/23 0500  Weight: 61.7 kg 63.3 kg 58.5 kg    Examination:    General:  Appearance:     Overweight male in no acute distress     Lungs:     On [place], respirations unlabored, ronchi  Heart:    Normal heart rate. Normal rhythm.    MS:   All extremities are intact.   Neurologic:   Awake, alert, irritable        Data Reviewed: I have personally reviewed following labs and imaging studies  CBC: Recent Labs  Lab 02/16/23 1013 02/20/2023 1813 02/19/23 0214 02/20/23 0603  WBC 8.9 6.9 8.2 5.6  NEUTROABS 7.1 5.3  --   --   HGB 12.6* 14.0 13.7 11.5*  HCT 36.6* 41.6 40.8 34.3*  MCV 87.4 88.9 89.9 90.3  PLT 265 221 235 177   Basic Metabolic Panel: Recent Labs  Lab 02/16/23 1013 03/15/2023 1813 02/19/23 0214 02/20/23 0603  NA 137 134* 135 132*  K 3.7 4.0 3.5 4.0  CL 102 104 100 102  CO2 27 20* 24 23  GLUCOSE 98 77 82 101*  BUN 35* 27* 29* 28*  CREATININE 1.31* 1.18 1.34* 1.46*  CALCIUM 8.8* 8.3* 8.5* 8.2*  MG  --   --  2.0  --   PHOS  --   --  3.1  --    GFR: Estimated Creatinine Clearance: 35.7 mL/min (A) (by C-G formula based on SCr of 1.46 mg/dL (H)). Liver Function Tests: Recent Labs  Lab 02/16/23 1013 02/16/2023 1813 02/19/23 0214  AST 60* 38 33  ALT 62* 43 41  ALKPHOS 120 86 93  BILITOT 0.6 1.3* 1.2  PROT 7.1 6.6 6.9  ALBUMIN 3.9 3.7 3.8   No results for input(s): "LIPASE", "AMYLASE" in the last 168 hours. No results for input(s): "AMMONIA" in the last 168 hours. Coagulation Profile: No results for input(s): "INR", "PROTIME" in the last 168 hours. Cardiac Enzymes: No results for input(s): "CKTOTAL", "CKMB", "CKMBINDEX", "TROPONINI" in the last 168 hours. BNP (last 3 results) No results for input(s): "PROBNP" in the last 8760 hours. HbA1C: No results for input(s): "HGBA1C" in the last 72 hours. CBG: No results for input(s): "GLUCAP" in the last 168 hours. Lipid Profile: No results for input(s): "CHOL", "HDL", "LDLCALC", "TRIG", "CHOLHDL", "LDLDIRECT" in the last 72 hours. Thyroid Function Tests: Recent Labs    02/19/23 1127   TSH 40.657*   Anemia Panel: No results for input(s): "VITAMINB12", "FOLATE", "FERRITIN", "TIBC", "IRON", "RETICCTPCT" in the last 72 hours. Sepsis Labs: Recent Labs  Lab 02/20/23 0602  PROCALCITON 0.98    Recent Results (from the past 240 hour(s))  Resp panel by RT-PCR (RSV, Flu A&B, Covid) Anterior Nasal Swab     Status: None   Collection Time: 03/08/2023  6:26 PM   Specimen: Anterior Nasal Swab  Result Value Ref Range Status   SARS Coronavirus 2 by RT PCR NEGATIVE NEGATIVE Final    Comment: (NOTE) SARS-CoV-2 target nucleic acids are NOT DETECTED.  The SARS-CoV-2 RNA is generally detectable in upper respiratory specimens during the acute phase of infection. The lowest concentration of SARS-CoV-2 viral copies this assay can detect is 138 copies/mL. A negative result does not preclude SARS-Cov-2 infection and should not be used as the sole  basis for treatment or other patient management decisions. A negative result may occur with  improper specimen collection/handling, submission of specimen other than nasopharyngeal swab, presence of viral mutation(s) within the areas targeted by this assay, and inadequate number of viral copies(<138 copies/mL). A negative result must be combined with clinical observations, patient history, and epidemiological information. The expected result is Negative.  Fact Sheet for Patients:  BloggerCourse.com  Fact Sheet for Healthcare Providers:  SeriousBroker.it  This test is no t yet approved or cleared by the [place] FDA and  has been authorized for detection and/or diagnosis of SARS-CoV-2 by FDA under an Emergency Use Authorization (EUA). This EUA will remain  in effect (meaning this test can be used) for the duration of the COVID-19 declaration under Section 564(b)(1) of the Act, 21 U.S.C.section 360bbb-3(b)(1), unless the authorization is terminated  or revoked sooner.        Influenza A by PCR NEGATIVE NEGATIVE Final   Influenza B by PCR NEGATIVE NEGATIVE Final    Comment: (NOTE) The Xpert Xpress SARS-CoV-2/FLU/RSV plus assay is intended as an aid in the diagnosis of influenza from Nasopharyngeal swab specimens and should not be used as a sole basis for treatment. Nasal washings and aspirates are unacceptable for Xpert Xpress SARS-CoV-2/FLU/RSV testing.  Fact Sheet for Patients: BloggerCourse.com  Fact Sheet for Healthcare Providers: SeriousBroker.it  This test is not yet approved or cleared by the [place] FDA and has been authorized for detection and/or diagnosis of SARS-CoV-2 by FDA under an Emergency Use Authorization (EUA). This EUA will remain in effect (meaning this test can be used) for the duration of the COVID-19 declaration under Section 564(b)(1) of the Act, 21 U.S.C. section 360bbb-3(b)(1), unless the authorization is terminated or revoked.     Resp Syncytial Virus by PCR NEGATIVE NEGATIVE Final    Comment: (NOTE) Fact Sheet for Patients: BloggerCourse.com  Fact Sheet for Healthcare Providers: SeriousBroker.it  This test is not yet approved or cleared by the [place] FDA and has been authorized for detection and/or diagnosis of SARS-CoV-2 by FDA under an Emergency Use Authorization (EUA). This EUA will remain in effect (meaning this test can be used) for the duration of the COVID-19 declaration under Section 564(b)(1) of the Act, 21 U.S.C. section 360bbb-3(b)(1), unless the authorization is terminated or revoked.  Performed at [facility], [address] [place], [place] [address]   MRSA Next Gen by PCR, Nasal     Status: None   Collection Time: 02/19/23  2:11 AM   Specimen: Nasal Mucosa; Nasal Swab  Result Value Ref Range Status   MRSA by PCR Next Gen NOT DETECTED NOT DETECTED Final    Comment:  (NOTE) The GeneXpert MRSA Assay (FDA approved for NASAL specimens only), is one component of a comprehensive MRSA colonization surveillance program. It is not intended to diagnose MRSA infection nor to guide or monitor treatment for MRSA infections. Test performance is not FDA approved in patients less than 58 years old. Performed at [facility], [address], [place], [place] [address]   Respiratory (~20 pathogens) panel by PCR     Status: Abnormal   Collection Time: 02/20/23 11:39 AM   Specimen: Nasopharyngeal Swab; Respiratory  Result Value Ref Range Status   Adenovirus NOT DETECTED NOT DETECTED Final   Coronavirus 229E NOT DETECTED NOT DETECTED Final    Comment: (NOTE) The Coronavirus on the Respiratory Panel, DOES NOT test for the novel  Coronavirus (2019 nCoV)  Coronavirus HKU1 NOT DETECTED NOT DETECTED Final   Coronavirus NL63 NOT DETECTED NOT DETECTED Final   Coronavirus OC43 NOT DETECTED NOT DETECTED Final   Metapneumovirus DETECTED (A) NOT DETECTED Final   Rhinovirus / Enterovirus NOT DETECTED NOT DETECTED Final   Influenza A NOT DETECTED NOT DETECTED Final   Influenza B NOT DETECTED NOT DETECTED Final   Parainfluenza Virus 1 NOT DETECTED NOT DETECTED Final   Parainfluenza Virus 2 NOT DETECTED NOT DETECTED Final   Parainfluenza Virus 3 NOT DETECTED NOT DETECTED Final   Parainfluenza Virus 4 NOT DETECTED NOT DETECTED Final   Respiratory Syncytial Virus NOT DETECTED NOT DETECTED Final   Bordetella pertussis NOT DETECTED NOT DETECTED Final   Bordetella Parapertussis NOT DETECTED NOT DETECTED Final   Chlamydophila pneumoniae NOT DETECTED NOT DETECTED Final   Mycoplasma pneumoniae NOT DETECTED NOT DETECTED Final    Comment: Performed at [facility], [address] [place], [place] [address]         Radiology Studies: No results found.      Scheduled Meds:  aspirin EC  81 mg Oral Daily   Chlorhexidine Gluconate Cloth  6 each  Topical Daily   enoxaparin (LOVENOX) injection  40 mg Subcutaneous Q24H   fluticasone furoate-vilanterol  1 puff Inhalation Daily   And   umeclidinium bromide  1 puff Inhalation Daily   ipratropium-albuterol  3 mL Nebulization BID   levothyroxine  75 mcg Oral Q0600   midodrine  5 mg Oral TID WC   montelukast  10 mg Oral QHS   polyethylene glycol  17 g Oral Daily   rosuvastatin  5 mg Oral Daily   senna-docusate  2 tablet Oral BID   sodium bicarbonate  650 mg Oral BID   sodium chloride flush  10-40 mL Intracatheter Q12H   tamsulosin  0.4 mg Oral Daily   Continuous Infusions:  albumin human 25 g (02/21/23 0914)   azithromycin Stopped (02/20/23 2316)   ceFEPime (MAXIPIME) IV Stopped (02/21/23 0535)     LOS: 3 days    Time spent: 45 minutes spent on chart review, discussion with nursing staff, consultants, updating family and interview/physical exam; more than 50% of that time was spent in counseling and/or coordination of care.    Joseph ArtJessica U Aldean Pipe, DO [facility] Available via Epic secure chat 7am-7pm After these hours, please refer to coverage provider listed on [email or web address] 02/21/2023, 10:06 AM

## 2023-02-21 NOTE — Progress Notes (Signed)
  Daily Progress Note   Patient Name: Ronald [name]       Date: 02/21/2023 DOB: 05/22/1956  Age: 67 y.o. MRN#: [id number] Attending Physician: Joseph Art, DO Primary Care Physician: Estevan Oaks, NP Admit Date: 03/01/2023 Length of Stay: 3 days  Reason for Consultation/Follow-up: {Reason for Consult:23484}  HPI/Patient Profile:  ***  Subjective:   Subjective: Chart Reviewed. Updates received. Patient Assessed. Created space and opportunity for patient  and family to explore thoughts and feelings regarding current medical situation.  Today's Discussion: ***  Review of Systems  Objective:   Vital Signs:  BP (!) 107/55   Pulse 87   Temp 98.5 F (36.9 C) (Oral)   Resp 20   Ht 5' (1.524 m)   Wt 58.5 kg   SpO2 96%   BMI 25.19 kg/m   Physical Exam: Physical Exam  Palliative Assessment/Data: ***    Existing Vynca/ACP Documentation: ***  Assessment & Plan:   Impression: Present on Admission:  Pneumonia  ***  SUMMARY OF RECOMMENDATIONS   ***  Symptom Management:  ***  Code Status: {Palliative Code status:23503}  Prognosis: {Palliative Care Prognosis:23504}  Discharge Planning: {Palliative dispostion:23505}  Discussed with: ***  Thank you for allowing [place] to participate in the care of Ronald [name] PMT will continue to support holistically.  Time Total: ***  Visit consisted of counseling and education dealing with the complex and emotionally intense issues of symptom management and palliative care in the setting of serious and potentially life-threatening illness. Greater than 50%  of this time was spent counseling and coordinating care related to the above assessment and plan.  Wynne Dust, NP Palliative Medicine Team  Team Phone # [phone] (Nights/Weekends)  07/14/2021, 8:17 AM

## 2023-02-21 NOTE — Progress Notes (Signed)
Chaplain will engage in visit with Ronald [name] for [employer]. Chaplain was able to speak with physician that because Ronald [name] has precautions, it is harder to utilize witnesses who can't work at the hospital. Lunette Stands will work to find solution to completing paperwork.   Physician noted that Ronald [name] wants his sister to be his healthcare agent. His son is not as involved in his care and lives out of state.   Hipolito Bayley, MDiv  02/21/23 1100  Spiritual Encounters  Type of Visit Initial  Conversation partners present during encounter Physician  Reason for visit Advance directives

## 2023-02-21 NOTE — Progress Notes (Signed)
Called to patient room to evaluate patient for agitation, wheezing.  Patient given: ativan IV, IV lasix and solumedrol.  DG x ray pending.  Placed on NRB with some improvement.  PCCM consulted as patient remains a full code and concern for decompensation.  Marlin Canary DO

## 2023-02-21 NOTE — Progress Notes (Signed)
eLink Physician-Brief Progress Note Patient Name: Ronald [name] DOB: 02-Jun-1956 MRN: [id number]   Date of Service  02/21/2023  HPI/Events of Note  67 year old male that was recent admission for pneumonia and discharged about a week ago that has right upper lobe infiltrates and associated hypoxic respiratory failure.  He has had marginal blood pressures today, he was in more respiratory distress, but was started on BiPAP and appears to be much more comfortable.  Received Ativan earlier which caused some somnolence, but again this is better now.  eICU Interventions  He has a port, could initiate norepinephrine, maintain systolic greater than 100.  Hypersomnolence is improved as Ativan is worn off.  Seems to tolerate the Ativan, although would prefer alternative agent if it persists.  No intervention indicated currently, will continue to monitor.     Intervention Category Minor Interventions: Clinical assessment - ordering diagnostic tests  Liana Camerer 02/21/2023, 9:01 PM

## 2023-02-22 ENCOUNTER — Inpatient Hospital Stay (HOSPITAL_COMMUNITY): Payer: 59

## 2023-02-22 DIAGNOSIS — J123 Human metapneumovirus pneumonia: Secondary | ICD-10-CM | POA: Diagnosis not present

## 2023-02-22 DIAGNOSIS — J9601 Acute respiratory failure with hypoxia: Secondary | ICD-10-CM

## 2023-02-22 DIAGNOSIS — J189 Pneumonia, unspecified organism: Secondary | ICD-10-CM | POA: Diagnosis not present

## 2023-02-22 DIAGNOSIS — I509 Heart failure, unspecified: Secondary | ICD-10-CM | POA: Diagnosis not present

## 2023-02-22 LAB — CBC
HCT: 32.1 % — ABNORMAL LOW (ref 39.0–52.0)
HCT: 37.5 % — ABNORMAL LOW (ref 39.0–52.0)
Hemoglobin: 10.9 g/dL — ABNORMAL LOW (ref 13.0–17.0)
Hemoglobin: 12.1 g/dL — ABNORMAL LOW (ref 13.0–17.0)
MCH: 30.3 pg (ref 26.0–34.0)
MCH: 30.3 pg (ref 26.0–34.0)
MCHC: 34 g/dL (ref 30.0–36.0)
MCHC: 32.3 g/dL (ref 30.0–36.0)
MCV: 89.2 fL (ref 80.0–100.0)
MCV: 93.8 fL (ref 80.0–100.0)
Platelets: 164 10*3/uL (ref 150–400)
Platelets: 177 10*3/uL (ref 150–400)
RBC: 3.6 MIL/uL — ABNORMAL LOW (ref 4.22–5.81)
RBC: 4 MIL/uL — ABNORMAL LOW (ref 4.22–5.81)
RDW: 17.9 % — ABNORMAL HIGH (ref 11.5–15.5)
RDW: 18.6 % — ABNORMAL HIGH (ref 11.5–15.5)
WBC: 3.3 10*3/uL — ABNORMAL LOW (ref 4.0–10.5)
WBC: 6.2 10*3/uL (ref 4.0–10.5)
nRBC: 0 % (ref 0.0–0.2)
nRBC: 0.3 % — ABNORMAL HIGH (ref 0.0–0.2)

## 2023-02-22 LAB — BASIC METABOLIC PANEL
Anion gap: 12 (ref 5–15)
Anion gap: 18 — ABNORMAL HIGH (ref 5–15)
BUN: 33 mg/dL — ABNORMAL HIGH (ref 8–23)
BUN: 43 mg/dL — ABNORMAL HIGH (ref 8–23)
CO2: 21 mmol/L — ABNORMAL LOW (ref 22–32)
CO2: 18 mmol/L — ABNORMAL LOW (ref 22–32)
Calcium: 8.9 mg/dL (ref 8.9–10.3)
Calcium: 8.9 mg/dL (ref 8.9–10.3)
Chloride: 102 mmol/L (ref 98–111)
Chloride: 102 mmol/L (ref 98–111)
Creatinine, Ser: 1.51 mg/dL — ABNORMAL HIGH (ref 0.61–1.24)
Creatinine, Ser: 1.86 mg/dL — ABNORMAL HIGH (ref 0.61–1.24)
GFR, Estimated: 51 mL/min — ABNORMAL LOW (ref >60)
GFR, Estimated: 40 mL/min — ABNORMAL LOW (ref >60)
Glucose, Bld: 188 mg/dL — ABNORMAL HIGH (ref 70–99)
Glucose, Bld: 172 mg/dL — ABNORMAL HIGH (ref 70–99)
Potassium: 4.2 mmol/L (ref 3.5–5.1)
Potassium: 4.7 mmol/L (ref 3.5–5.1)
Sodium: 135 mmol/L (ref 135–145)
Sodium: 138 mmol/L (ref 135–145)

## 2023-02-22 LAB — BLOOD GAS, VENOUS
Acid-Base Excess: 0.3 mmol/L (ref 0.0–2.0)
Bicarbonate: 25.4 mmol/L (ref 20.0–28.0)
O2 Saturation: 58 %
Patient temperature: 37
pCO2, Ven: 42 mmHg — ABNORMAL LOW (ref 44–60)
pH, Ven: 7.39 (ref 7.25–7.43)
pO2, Ven: 38 mmHg (ref 32–45)

## 2023-02-22 LAB — MAGNESIUM: Magnesium: 2.8 mg/dL — ABNORMAL HIGH (ref 1.7–2.4)

## 2023-02-22 MED ORDER — NOREPINEPHRINE 4 MG/250ML-% IV SOLN: 0.0000 ug/min | INTRAVENOUS | Status: DC

## 2023-02-22 MED ORDER — ETOMIDATE 2 MG/ML IV SOLN
INTRAVENOUS | Status: AC
  Administered 2023-02-22: 20 mg via INTRAVENOUS
  Filled 2023-02-22: qty 10

## 2023-02-22 MED ORDER — ETOMIDATE 2 MG/ML IV SOLN: 20.0000 mg | Freq: Once | INTRAVENOUS | Status: DC

## 2023-02-22 MED ORDER — ROCURONIUM BROMIDE 50 MG/5ML IV SOLN
100.0000 mg | Freq: Once | INTRAVENOUS | Status: AC
  Filled 2023-02-22: qty 10

## 2023-02-22 MED ORDER — PHENYLEPHRINE 80 MCG/ML (10ML) SYRINGE FOR IV PUSH (FOR BLOOD PRESSURE SUPPORT)
PREFILLED_SYRINGE | INTRAVENOUS | Status: AC
  Filled 2023-02-22: qty 10

## 2023-02-22 MED ORDER — SUCCINYLCHOLINE CHLORIDE 200 MG/10ML IV SOSY: 100.0000 mg | PREFILLED_SYRINGE | Freq: Once | INTRAVENOUS | Status: DC

## 2023-02-22 MED ORDER — PHENYLEPHRINE HCL-NACL 20-0.9 MG/250ML-% IV SOLN
INTRAVENOUS | Status: AC
  Filled 2023-02-22: qty 250

## 2023-02-22 MED ORDER — NOREPINEPHRINE 4 MG/250ML-% IV SOLN
INTRAVENOUS | Status: AC
  Administered 2023-02-22: 4 mg
  Filled 2023-02-22: qty 250

## 2023-02-22 MED ORDER — PROPOFOL 1000 MG/100ML IV EMUL
INTRAVENOUS | Status: AC
  Administered 2023-02-22: 10 ug/kg/min via INTRAVENOUS
  Filled 2023-02-22: qty 100

## 2023-02-22 MED ORDER — ETOMIDATE 2 MG/ML IV SOLN: 20.0000 mg | Freq: Once | INTRAVENOUS | Status: AC

## 2023-02-22 MED ORDER — ROCURONIUM BROMIDE 10 MG/ML (PF) SYRINGE
PREFILLED_SYRINGE | INTRAVENOUS | Status: AC
  Administered 2023-02-22: 100 mg via INTRAVENOUS
  Filled 2023-02-22: qty 10

## 2023-02-22 MED ORDER — PROPOFOL 1000 MG/100ML IV EMUL: 5.0000 ug/kg/min | INTRAVENOUS | Status: DC

## 2023-02-22 NOTE — Progress Notes (Signed)
PT Cancellation Note  Patient Details Name: Ronald [name] MRN: [id number] DOB: October 28, 1956   Cancelled Treatment:    Reason Eval/Treat Not Completed: Medical issues which prohibited therapy Per RN, resp. Distress with  transfers, now on BiPAP. [name] PT Acute Rehabilitation Services Office [phone] Weekend pager-[phone]   [name] 02/22/2023, 11:18 AM

## 2023-02-22 NOTE — Progress Notes (Signed)
This IV team RN responded to CODE BLUE. Pt has left chest PAC that is currently accessed and being used during code. Once ROSC achieved labs drawn from [facility] as documented. No further access needed at this time. Advised unit staff to consult again if needed.

## 2023-02-22 NOTE — Progress Notes (Signed)
RT placed Mepilex on bridge of nose to protect skin integrity while on BiPAP.

## 2023-02-22 NOTE — Progress Notes (Signed)
Per RN, PT placed back on BiPAP for work of breathing post ADL's. Confirmed 6/4- 16- Fio2 up to 50% (MD Spo2 goal 88-95%).

## 2023-02-22 NOTE — Progress Notes (Addendum)
PROGRESS NOTE    Ronald [name]  SHF:[id number] DOB: 04-12-1956 DOA: 03/11/2023 PCP: Estevan Oaks, NP    Brief Narrative:  Ronald [name] is a 66 y.o. male with medical history significant for chronic combined diastolic and systolic CHF, non-small cell lung cancer status post left pneumonectomy/chemotherapy/radiation, moderate mitral and tricuspid valves regurgitation, hyperlipidemia, recently admitted for right lower lobe pneumonia who presented to [facility] ED from home with complaints of chest pain and shortness of breath.     High-sensitivity troponin mildly elevated.  EDP discussed the case with cardiology who recommended trending troponin and close monitoring for now.  Suspected demand ischemia at this time.  Chest x-ray and subsequent chest CT scan revealed right upper lobe infiltrates suggestive of pneumonia.  No evidence of pulmonary embolism on CT angio chest.   The patient was started on empiric IV antibiotics Rocephin and azithromycin in the ED.  TRH, hospitalist service, was asked to admit.   Slow to improved, +meta pneumovirus, requiring PRN bipap. PCCM consulted   Assessment and Plan: Right upper lobe pneumonia, POA as well as metapneumovirus Recent admission for right lower lobe pneumonia, discharged about a week ago with similar -cefepime nebs Early mobilization as tolerated -re-consulted palliative care- -continue current management for now -droplet/contact precautions -IV steroids -PRN bipap -PCCM consult appreciated    Acute on chronic combined diastolic and systolic CHF Presented with elevated BNP greater than 1200 Recent 2D echo done on 02/06/2023 showed LVEF 45 to 50% with grade 2 diastolic dysfunction Received a dose of IV Lasix in the ED 40 mg x  with resultant low BP needing midodrine -given another dose of IV lasix after respiratory distress on 4/8- with 1L out -strict I's and O's and daily weight  Atypical chest pain Elevated troponin History of  coronary artery disease status post PCI with stent placement Anterior chest wall tenderness with palpation on exam EDP discussed the case with cardiology, recommended monitoring for now Closely monitor on telemetry Resume home aspirin and Crestor.   Acute on chronic hypoxic respiratory failure secondary to pneumonia and heart failure exacerbation. At baseline on 1-2 L nasal cannula continuously -PRN bipap   History of lung cancer status post left pneumonectomy Follows with Dr. [name] outpatient -added to treatment team   Hypovolemic hyponatremia -mild and resolved    History of chronic hypotension on midodrine Resume home midodrine Maintain MAP greater than 65. Closely monitor vital signs   Non-compliance with synthroid for hypothyroid - TSH still elevated -resume synthroid  CKD stage 3a -trend   DVT prophylaxis: enoxaparin (LOVENOX) injection 40 mg Start: 02/26/2023 2230    Code Status: Full Code   Disposition Plan:  Level of care: Stepdown Status is: Inpatient Remains inpatient appropriate     Consultants:  Palliative care PCCM   Subjective: Weaned off Bipap to [place]-- still c/o SOB  Objective: Vitals:   02/22/23 0600 02/22/23 0700 02/22/23 0754 02/22/23 0806  BP: 103/61 102/63    Pulse: 85 85    Resp: (!) 23 (!) 25    Temp:   (!) 97.3 F (36.3 C)   TempSrc:   Axillary   SpO2: 99% 98% 98% 98%  Weight:      Height:        Intake/Output Summary (Last 24 hours) at 02/22/2023 0938 Last data filed at 02/22/2023 0500 Gross per 24 hour  Intake 440.53 ml  Output 1050 ml  Net -609.47 ml   Filed Weights   02/20/23 0141 02/21/23 0500 02/22/23  0500  Weight: 63.3 kg 58.5 kg 58.1 kg    Examination:     General: Appearance:     Overweight male in no acute distress     Lungs:     respirations mildly labored, rhonchus sounds on right  Heart:    Normal heart rate.   MS:   All extremities are intact.   Neurologic:   Awake, alert- irritable       Data  Reviewed: I have personally reviewed following labs and imaging studies  CBC: Recent Labs  Lab 02/16/23 1013 03/13/2023 1813 02/19/23 0214 02/20/23 0603 02/22/23 0542  WBC 8.9 6.9 8.2 5.6 3.3*  NEUTROABS 7.1 5.3  --   --   --   HGB 12.6* 14.0 13.7 11.5* 10.9*  HCT 36.6* 41.6 40.8 34.3* 32.1*  MCV 87.4 88.9 89.9 90.3 89.2  PLT 265 221 235 177 164   Basic Metabolic Panel: Recent Labs  Lab 02/16/23 1013 02/21/2023 1813 02/19/23 0214 02/20/23 0603 02/22/23 0542  NA 137 134* 135 132* 135  K 3.7 4.0 3.5 4.0 4.2  CL 102 104 100 102 102  CO2 27 20* 24 23 21*  GLUCOSE 98 77 82 101* 188*  BUN 35* 27* 29* 28* 33*  CREATININE 1.31* 1.18 1.34* 1.46* 1.51*  CALCIUM 8.8* 8.3* 8.5* 8.2* 8.9  MG  --   --  2.0  --   --   PHOS  --   --  3.1  --   --    GFR: Estimated Creatinine Clearance: 34.5 mL/min (A) (by C-G formula based on SCr of 1.51 mg/dL (H)). Liver Function Tests: Recent Labs  Lab 02/16/23 1013 02/25/2023 1813 02/19/23 0214  AST 60* 38 33  ALT 62* 43 41  ALKPHOS 120 86 93  BILITOT 0.6 1.3* 1.2  PROT 7.1 6.6 6.9  ALBUMIN 3.9 3.7 3.8   No results for input(s): "LIPASE", "AMYLASE" in the last 168 hours. No results for input(s): "AMMONIA" in the last 168 hours. Coagulation Profile: No results for input(s): "INR", "PROTIME" in the last 168 hours. Cardiac Enzymes: No results for input(s): "CKTOTAL", "CKMB", "CKMBINDEX", "TROPONINI" in the last 168 hours. BNP (last 3 results) No results for input(s): "PROBNP" in the last 8760 hours. HbA1C: No results for input(s): "HGBA1C" in the last 72 hours. CBG: No results for input(s): "GLUCAP" in the last 168 hours. Lipid Profile: No results for input(s): "CHOL", "HDL", "LDLCALC", "TRIG", "CHOLHDL", "LDLDIRECT" in the last 72 hours. Thyroid Function Tests: Recent Labs    02/19/23 1127  TSH 40.657*   Anemia Panel: No results for input(s): "VITAMINB12", "FOLATE", "FERRITIN", "TIBC", "IRON", "RETICCTPCT" in the last 72  hours. Sepsis Labs: Recent Labs  Lab 02/20/23 0602  PROCALCITON 0.98    Recent Results (from the past 240 hour(s))  Resp panel by RT-PCR (RSV, Flu A&B, Covid) Anterior Nasal Swab     Status: None   Collection Time: 03/07/2023  6:26 PM   Specimen: Anterior Nasal Swab  Result Value Ref Range Status   SARS Coronavirus 2 by RT PCR NEGATIVE NEGATIVE Final    Comment: (NOTE) SARS-CoV-2 target nucleic acids are NOT DETECTED.  The SARS-CoV-2 RNA is generally detectable in upper respiratory specimens during the acute phase of infection. The lowest concentration of SARS-CoV-2 viral copies this assay can detect is 138 copies/mL. A negative result does not preclude SARS-Cov-2 infection and should not be used as the sole basis for treatment or other patient management decisions. A negative result may occur with  improper specimen collection/handling, submission of specimen other than nasopharyngeal swab, presence of viral mutation(s) within the areas targeted by this assay, and inadequate number of viral copies(<138 copies/mL). A negative result must be combined with clinical observations, patient history, and epidemiological information. The expected result is Negative.  Fact Sheet for Patients:  BloggerCourse.com  Fact Sheet for Healthcare Providers:  SeriousBroker.it  This test is no t yet approved or cleared by the [place] FDA and  has been authorized for detection and/or diagnosis of SARS-CoV-2 by FDA under an Emergency Use Authorization (EUA). This EUA will remain  in effect (meaning this test can be used) for the duration of the COVID-19 declaration under Section 564(b)(1) of the Act, 21 U.S.C.section 360bbb-3(b)(1), unless the authorization is terminated  or revoked sooner.       Influenza A by PCR NEGATIVE NEGATIVE Final   Influenza B by PCR NEGATIVE NEGATIVE Final    Comment: (NOTE) The Xpert Xpress SARS-CoV-2/FLU/RSV  plus assay is intended as an aid in the diagnosis of influenza from Nasopharyngeal swab specimens and should not be used as a sole basis for treatment. Nasal washings and aspirates are unacceptable for Xpert Xpress SARS-CoV-2/FLU/RSV testing.  Fact Sheet for Patients: BloggerCourse.com  Fact Sheet for Healthcare Providers: SeriousBroker.it  This test is not yet approved or cleared by the [place] FDA and has been authorized for detection and/or diagnosis of SARS-CoV-2 by FDA under an Emergency Use Authorization (EUA). This EUA will remain in effect (meaning this test can be used) for the duration of the COVID-19 declaration under Section 564(b)(1) of the Act, 21 U.S.C. section 360bbb-3(b)(1), unless the authorization is terminated or revoked.     Resp Syncytial Virus by PCR NEGATIVE NEGATIVE Final    Comment: (NOTE) Fact Sheet for Patients: BloggerCourse.com  Fact Sheet for Healthcare Providers: SeriousBroker.it  This test is not yet approved or cleared by the [place] FDA and has been authorized for detection and/or diagnosis of SARS-CoV-2 by FDA under an Emergency Use Authorization (EUA). This EUA will remain in effect (meaning this test can be used) for the duration of the COVID-19 declaration under Section 564(b)(1) of the Act, 21 U.S.C. section 360bbb-3(b)(1), unless the authorization is terminated or revoked.  Performed at [facility], [address], [place], [place] [address]   MRSA Next Gen by PCR, Nasal     Status: None   Collection Time: 02/19/23  2:11 AM   Specimen: Nasal Mucosa; Nasal Swab  Result Value Ref Range Status   MRSA by PCR Next Gen NOT DETECTED NOT DETECTED Final    Comment: (NOTE) The GeneXpert MRSA Assay (FDA approved for NASAL specimens only), is one component of a comprehensive MRSA colonization  surveillance program. It is not intended to diagnose MRSA infection nor to guide or monitor treatment for MRSA infections. Test performance is not FDA approved in patients less than 68 years old. Performed at [facility], [address], [place], [place] [address]   Respiratory (~20 pathogens) panel by PCR     Status: Abnormal   Collection Time: 02/20/23 11:39 AM   Specimen: Nasopharyngeal Swab; Respiratory  Result Value Ref Range Status   Adenovirus NOT DETECTED NOT DETECTED Final   Coronavirus 229E NOT DETECTED NOT DETECTED Final    Comment: (NOTE) The Coronavirus on the Respiratory Panel, DOES NOT test for the novel  Coronavirus (2019 nCoV)    Coronavirus HKU1 NOT DETECTED NOT DETECTED Final   Coronavirus NL63 NOT DETECTED NOT  DETECTED Final   Coronavirus OC43 NOT DETECTED NOT DETECTED Final   Metapneumovirus DETECTED (A) NOT DETECTED Final   Rhinovirus / Enterovirus NOT DETECTED NOT DETECTED Final   Influenza A NOT DETECTED NOT DETECTED Final   Influenza B NOT DETECTED NOT DETECTED Final   Parainfluenza Virus 1 NOT DETECTED NOT DETECTED Final   Parainfluenza Virus 2 NOT DETECTED NOT DETECTED Final   Parainfluenza Virus 3 NOT DETECTED NOT DETECTED Final   Parainfluenza Virus 4 NOT DETECTED NOT DETECTED Final   Respiratory Syncytial Virus NOT DETECTED NOT DETECTED Final   Bordetella pertussis NOT DETECTED NOT DETECTED Final   Bordetella Parapertussis NOT DETECTED NOT DETECTED Final   Chlamydophila pneumoniae NOT DETECTED NOT DETECTED Final   Mycoplasma pneumoniae NOT DETECTED NOT DETECTED Final    Comment: Performed at [facility], [address] [place], [place] [address]         Radiology Studies: DG CHEST PORT 1 VIEW  Result Date: 02/21/2023 CLINICAL DATA:  Hypoxia EXAM: PORTABLE CHEST 1 VIEW COMPARISON:  2023-09-24 FINDINGS: Cardiac shadow is stable. Left chest wall port is again seen in satisfactory position. The right lung is mildly  hyperinflated. Fiducial markers are again noted. Increasing interstitial changes are noted. Volume loss and opacification of left hemithorax is again noted. IMPRESSION: Increased interstitial changes throughout the right lung favoring edema although the possibility of lymphangitic spread could not be totally excluded. Postsurgical changes on the left. Electronically Signed   By: Alcide CleverMark  Lukens M.D.   On: 02/21/2023 20:32        Scheduled Meds:  arformoterol  15 mcg Nebulization BID   aspirin EC  81 mg Oral Daily   budesonide (PULMICORT) nebulizer solution  0.5 mg Nebulization BID   Chlorhexidine Gluconate Cloth  6 each Topical Daily   enoxaparin (LOVENOX) injection  40 mg Subcutaneous Q24H   levothyroxine  75 mcg Oral Q0600   methylPREDNISolone (SOLU-MEDROL) injection  40 mg Intravenous Q12H   midodrine  5 mg Oral TID WC   montelukast  10 mg Oral QHS   polyethylene glycol  17 g Oral Daily   revefenacin  175 mcg Nebulization Daily   rosuvastatin  5 mg Oral Daily   senna-docusate  2 tablet Oral BID   sodium bicarbonate  650 mg Oral BID   sodium chloride flush  10-40 mL Intracatheter Q12H   tamsulosin  0.4 mg Oral Daily   Continuous Infusions:  azithromycin Stopped (02/21/23 2243)   ceFEPime (MAXIPIME) IV Stopped (02/22/23 0355)     LOS: 4 days    Time spent: 45 minutes spent on chart review, discussion with nursing staff, consultants, updating family and interview/physical exam; more than 50% of that time was spent in counseling and/or coordination of care.    Joseph ArtJessica U Mirtha Jain, DO [facility] Available via Epic secure chat 7am-7pm After these hours, please refer to coverage provider listed on [email or web address] 02/22/2023, 9:38 AM

## 2023-02-22 NOTE — Progress Notes (Signed)
eLink Physician-Brief Progress Note Patient Name: [name] DOB: [birth year] MRN: [id number]   Date of Service  02/22/2023  HPI/Events of Note  Patient post-code with ROSC, intubated by ED physician.  eICU Interventions  Ventilator settings adjusted taking into account single lung, Propofol gtt for sedation, on minimal dose Levo which is being titrated to maintain MAP > 65, CXR reviewed.        Thomasene Lot Naylea Wigington 02/22/2023, 11:01 PM

## 2023-02-22 NOTE — Progress Notes (Signed)
Ventilator settings discussed with Dr. Juanetta Snow, CCM via E-Link to address the patient's O2 saturation concerns.  Settings at this time are PRVC / Vt= 400, RR= 35, FiO2= 100%, PEEP= 8.0.

## 2023-02-22 NOTE — ED Provider Notes (Signed)
Department of Emergency Medicine CODE BLUE CONSULTATION    Code Blue CONSULT NOTE  Chief Complaint: Cardiac arrest/unresponsive   Level V Caveat: Unresponsive  History of present illness: I was contacted by the hospital for a CODE BLUE cardiac arrest upstairs and presented to the patient's bedside.    ROS: Unable to obtain, Level V caveat  Scheduled Meds:  arformoterol  15 mcg Nebulization BID   aspirin EC  81 mg Oral Daily   budesonide (PULMICORT) nebulizer solution  0.5 mg Nebulization BID   Chlorhexidine Gluconate Cloth  6 each Topical Daily   enoxaparin (LOVENOX) injection  40 mg Subcutaneous Q24H   etomidate       levothyroxine  75 mcg Oral Q0600   methylPREDNISolone (SOLU-MEDROL) injection  40 mg Intravenous Q12H   midodrine  5 mg Oral TID WC   montelukast  10 mg Oral QHS   phenylephrine       phenylephrine       phenylephrine       polyethylene glycol  17 g Oral Daily   revefenacin  175 mcg Nebulization Daily   rocuronium bromide       rosuvastatin  5 mg Oral Daily   senna-docusate  2 tablet Oral BID   sodium bicarbonate  650 mg Oral BID   sodium chloride flush  10-40 mL Intracatheter Q12H   tamsulosin  0.4 mg Oral Daily   Continuous Infusions:  azithromycin Stopped (02/22/23 2256)   ceFEPime (MAXIPIME) IV Stopped (02/22/23 1658)   propofol     PRN Meds:.acetaminophen, albuterol, etomidate, LORazepam, mouth rinse, oxyCODONE, phenylephrine, phenylephrine, phenylephrine, polyethylene glycol, polyvinyl alcohol, prochlorperazine, propofol, rocuronium bromide, sodium chloride flush Past Medical History:  Diagnosis Date   CHF (congestive heart failure)    COPD (chronic obstructive pulmonary disease)    Coronary artery disease    Hypertension    lung ca dx'd 2005   chemo/xrt comp 2005, lung ca   Myocardial infarction    Shortness of breath    Past Surgical History:  Procedure Laterality Date   CARDIAC CATHETERIZATION N/A 10/13/2015   Procedure: Left  Heart Cath and Coronary Angiography;  Surgeon: Rinaldo Cloud, MD;  Location: [facility] INVASIVE CV LAB;  Service: Cardiovascular;  Laterality: N/A;   CORONARY ANGIOPLASTY WITH STENT PLACEMENT     LEFT HEART CATH AND CORONARY ANGIOGRAPHY N/A 01/03/2018   Procedure: LEFT HEART CATH AND CORONARY ANGIOGRAPHY;  Surgeon: Rinaldo Cloud, MD;  Location: [facility] INVASIVE CV LAB;  Service: Cardiovascular;  Laterality: N/A;   LEFT HEART CATHETERIZATION WITH CORONARY ANGIOGRAM N/A 04/07/2012   Procedure: LEFT HEART CATHETERIZATION WITH CORONARY ANGIOGRAM;  Surgeon: Robynn Pane, MD;  Location: [facility] CATH LAB;  Service: Cardiovascular;  Laterality: N/A;   PNEUMONECTOMY  2005   left   vocal cord surgery  2005   Social History   Socioeconomic History   Marital status: Married    Spouse name: Not on file   Number of children: Not on file   Years of education: Not on file   Highest education level: Not on file  Occupational History   Not on file  Tobacco Use   Smoking status: Former    Types: Cigarettes    Quit date: 09/30/2013    Years since quitting: 9.4   Smokeless tobacco: Never  Substance and Sexual Activity   Alcohol use: No    Comment: former   Drug use: No    Types: Cocaine   Sexual activity: Not Currently  Other Topics Concern  Not on file  Social History Narrative   Not on file   Social Determinants of Health   Financial Resource Strain: Not on file  Food Insecurity: No Food Insecurity (02/19/2023)   Hunger Vital Sign    Worried About Running Out of Food in the Last Year: Never true    Ran Out of Food in the Last Year: Never true  Recent Concern: Food Insecurity - Food Insecurity Present (02/05/2023)   Hunger Vital Sign    Worried About Running Out of Food in the Last Year: Sometimes true    Ran Out of Food in the Last Year: Sometimes true  Transportation Needs: No Transportation Needs (02/19/2023)   PRAPARE - Administrator, Civil Service (Medical): No    Lack of Transportation  (Non-Medical): No  Physical Activity: Not on file  Stress: Not on file  Social Connections: Not on file  Intimate Partner Violence: Not At Risk (02/19/2023)   Humiliation, Afraid, Rape, and Kick questionnaire    Fear of Current or Ex-Partner: No    Emotionally Abused: No    Physically Abused: No    Sexually Abused: No   Allergies  Allergen Reactions   Penicillins Itching    Last set of Vital Signs (not current) Vitals:   02/22/23 2222 02/22/23 2305  BP:  95/75  Pulse: (!) 124 (!) 113  Resp: (!) 30 (!) 35  Temp:    SpO2: (!) 9% 97%      Physical Exam Gen: unresponsive Cardiovascular: pulseless  Resp: apneic. Breath sounds equal bilaterally with bagging  Abd: nondistended  Neuro: GCS 3, unresponsive to pain  HEENT: No blood in posterior pharynx, gag reflex absent  Neck: No crepitus  Musculoskeletal: No deformity  Skin: warm  Procedures  INTUBATION Performed by: Richardean Canal Required items: required blood products, implants, devices, and special equipment available Patient identity confirmed: provided demographic data and hospital-assigned identification number Time out: Immediately prior to procedure a "time out" was called to verify the correct patient, procedure, equipment, support staff and site/side marked as required. Indications: hypoxia, cardiac arrest  Intubation method: direct laryngoscopy Preoxygenation: BVM Sedatives: 20 mg etomidate  Paralytic: 100 mg rocuronium Tube Size: 7.5 cuffed Post-procedure assessment: chest rise and ETCO2 monitor Breath sounds: equal and absent over the epigastrium Tube secured by Respiratory Therapy Patient tolerated the procedure well with no immediate complications.  CRITICAL CARE Performed by: Richardean Canal Total critical care time: 70 Critical care time was exclusive of separately billable procedures and treating other patients. Critical care was necessary to treat or prevent imminent or life-threatening  deterioration. Critical care was time spent personally by me on the following activities: development of treatment plan with patient and/or surrogate as well as nursing, discussions with consultants, evaluation of patient's response to treatment, examination of patient, obtaining history from patient or surrogate, ordering and performing treatments and interventions, ordering and review of laboratory studies, ordering and review of radiographic studies, pulse oximetry and re-evaluation of patient's condition.  Cardiopulmonary Resuscitation (CPR) Procedure Note Directed/Performed by: Richardean Canal I personally directed ancillary staff and/or performed CPR in an effort to regain return of spontaneous circulation and to maintain cardiac, neuro and systemic perfusion.    Medical Decision making  I was called to the bedside around 10 PM because patient apparently lost pulses.  Per the nurse, patient was on BiPAP and took off his BiPAP and refused to be put back on it.  He then became very hypoxic and  lost pulses.  ACLS was initiated prior to my arrival.  Assessment and Plan  On my arrival, patient was apneic and very difficult to bag.  We had to paralyze the patient and I was able to intubate patient with 7.5 ET tube.  Patient also regain ROSC within several doses of epinephrine.  Total downtime was less than 20 minutes.  Repeat EKG shows sinus tachycardia with no obvious arrhythmia.  Chest x-ray confirmed ET tube placement.  Patient also was started on propofol drip as well as Levophed.  Critical care to assume care of patient.     Charlynne PanderYao, Lorretta Kerce Hsienta, MD 02/22/23 [phone]

## 2023-02-22 NOTE — Progress Notes (Addendum)
Code blue called overhead for the patient.  Also informed by [facility] floor coverage colleague of Code blue in progress.  Presented to the patient's room, EDP Dr. Silverio Lay is running the code. Advanced care team present in the room with ongoing ACLS resuscitative efforts.  Critical care medicine e-link is also connected.  The patient achieved ROSC and is intubated.  Time: 15 minutes.

## 2023-02-22 NOTE — Progress Notes (Signed)
Skin integrity on bridge of nose is WNL at this time. PT denies need for cushion application at this time. Materials are at bedside when / if needed.

## 2023-02-22 NOTE — Progress Notes (Signed)
Removed PT from BiPAP and placed on 2 lpm [place] (2 LPM Dep).  PT does not appear to be in respiratory distress at this time and aware of self, time and place. RN aware.

## 2023-02-22 NOTE — Progress Notes (Signed)
  Daily Progress Note   Patient Name: Ronald [name]       Date: 02/22/2023 DOB: 01/29/1956  Age: 67 y.o. MRN#: [id number] Attending Physician: Joseph Art, DO Primary Care Physician: Estevan Oaks, NP Admit Date: 03/07/2023 Length of Stay: 4 days  Reason for Consultation/Follow-up: {Reason for Consult:23484}  HPI/Patient Profile:  ***  Subjective:   Subjective: Chart Reviewed. Updates received. Patient Assessed. Created space and opportunity for patient  and family to explore thoughts and feelings regarding current medical situation.  Today's Discussion: ***  Review of Systems  Objective:   Vital Signs:  BP 102/63   Pulse (!) 110   Temp (!) 97.3 F (36.3 C) (Axillary)   Resp (!) 23   Ht 5' (1.524 m)   Wt 58.1 kg   SpO2 (!) 76%   BMI 25.02 kg/m   Physical Exam: Physical Exam  Palliative Assessment/Data: ***    Existing Vynca/ACP Documentation: ***  Assessment & Plan:   Impression: Present on Admission:  Pneumonia  ***  SUMMARY OF RECOMMENDATIONS   ***  Symptom Management:  ***  Code Status: {Palliative Code status:23503}  Prognosis: {Palliative Care Prognosis:23504}  Discharge Planning: {Palliative dispostion:23505}  Discussed with: ***  Thank you for allowing [place] to participate in the care of Ronald [name] PMT will continue to support holistically.  Time Total: ***  Visit consisted of counseling and education dealing with the complex and emotionally intense issues of symptom management and palliative care in the setting of serious and potentially life-threatening illness. Greater than 50%  of this time was spent counseling and coordinating care related to the above assessment and plan.  Wynne Dust, NP Palliative Medicine Team  Team Phone # [phone] (Nights/Weekends)  07/14/2021, 8:17 AM

## 2023-02-22 NOTE — Progress Notes (Signed)
eLink Physician-Brief Progress Note Patient Name: RANGER MIDURA DOB: 24-Mar-1956 MRN: [id number]   Date of Service  02/22/2023  HPI/Events of Note  Called into the room due to impending code blue, on arrival in the room CPR commenced, RT instructed to bag the patient, first dose of Epinephrine ordered, ACLS protocol for PEA, ED physician arrived in the room and took over running the code.  eICU Interventions  Code Blue management.        Thomasene Lot Aleece Loyd 02/22/2023, 10:28 PM

## 2023-02-22 NOTE — TOC Initial Note (Addendum)
Transition of Care [facility]) - Initial/Assessment Note    Patient Details  Name: Ronald [name] MRN: [id number] Date of Birth: 1956/03/16  Transition of Care [facility]) CM/SW Contact:    Ronald Atlas, RN Phone Number: 02/22/2023, 4:55 PM  Clinical Narrative:    Per chart review patient seen/dc from [facility] on 02/08/23 with HHPT with [facility] Patient currently on [facility] SDU for Pneumonia. PT has recommended HHPT, this RNCM notified Ronald [name] with [facility] of patient being readmitted to the hospital, awaiting a response. Palliative has been consulted this visit. This RNCM spoke with patient's wife  Ronald [name] who reports patient lives with his sister Ronald [name].    TOC will continue to monitor for dc needs.                Expected Discharge Plan: Home w Home Health Services Barriers to Discharge: Continued Medical Work up   Patient Goals and CMS Choice Patient states their goals for this hospitalization and ongoing recovery are:: return home CMS Medicare.gov Compare Post Acute Care list provided to:: Patient Represenative (must comment) Choice offered to / list presented to : Spouse Nunda ownership interest in [facility].provided to:: Spouse    Expected Discharge Plan and Services In-house Referral: NA Discharge Planning Services: CM Consult Post Acute Care Choice: Home Health Living arrangements for the past 2 months: Apartment                 DME Arranged: N/A DME Agency: NA                  Prior Living Arrangements/Services Living arrangements for the past 2 months: Apartment Lives with:: Siblings Patient language and need for interpreter reviewed:: Yes        Need for Family Participation in Patient Care: No (Comment) Care giver support system in place?: Yes (comment) Current home services: DME (cane, w/c, home O2) Criminal Activity/Legal Involvement Pertinent to Current Situation/Hospitalization: No - Comment as needed  Activities of Daily Living Home Assistive  Devices/Equipment: Oxygen ADL Screening (condition at time of admission) Patient's cognitive ability adequate to safely complete daily activities?: Yes Is the patient deaf or have difficulty hearing?: No Does the patient have difficulty seeing, even when wearing glasses/contacts?: No Does the patient have difficulty concentrating, remembering, or making decisions?: Yes Patient able to express need for assistance with ADLs?: Yes Does the patient have difficulty dressing or bathing?: No Independently performs ADLs?: Yes (appropriate for developmental age) Does the patient have difficulty walking or climbing stairs?: No Weakness of Legs: None Weakness of Arms/Hands: None  Permission Sought/Granted Permission sought to share information with : Case Manager Permission granted to share information with : Yes, Verbal Permission Granted  Share Information with NAME: Case manager           Emotional Assessment Appearance:: Appears stated age Attitude/Demeanor/Rapport: Gracious   Orientation: : Oriented to Self, Oriented to Place, Oriented to  Time Alcohol / Substance Use: Not Applicable Psych Involvement: No (comment)  Admission diagnosis:  Pneumonia [J18.9] Pneumonia of right upper lobe due to infectious organism [J18.9] Acute on chronic congestive heart failure, unspecified heart failure type [I50.9] Patient Active Problem List   Diagnosis Date Noted   Pneumonia 03/06/2023   Need for emotional support 02/09/2023   Acute on chronic combined systolic and diastolic CHF (congestive heart failure) 02/09/2023   Palliative care encounter 02/08/2023   SOB (shortness of breath) 02/08/2023   Goals of care, counseling/discussion 02/08/2023   Cancer associated pain  02/08/2023   High risk medication use 02/08/2023   Counseling and coordination of care 02/08/2023   Port-A-Cath in place 12/15/2022   Gastroesophageal reflux disease without esophagitis 10/15/2022   Mixed hyperlipidemia  10/15/2022   Hypothyroidism (acquired) 10/09/2022   Nonrheumatic aortic valve stenosis 08/27/2022   Peripheral neuropathy 08/07/2022   Carcinoma in situ of eye 09/07/2021   Acute on chronic congestive heart failure 12/30/2017   Hypotension 12/30/2017   History of pneumonectomy    Odynophagia    CAP (community acquired pneumonia) 01/28/2017   AKI (acute kidney injury) 01/28/2017   Unstable angina 10/10/2015   COPD exacerbation 07/04/2015   Essential hypertension 07/04/2015   Acute respiratory failure with hypoxia 07/04/2015   lung ca    Major depressive disorder, single episode 05/01/2015   Drug overdose, intentional    Chronic systolic CHF (congestive heart failure) 10/30/2013   COPD with acute exacerbation (HCC) 09/16/2012   Tobacco use 09/16/2012   COPD (chronic obstructive pulmonary disease) (HCC) 09/15/2012   Status post cardiac catheterization 04/08/2012   Acute systolic CHF (congestive heart failure) 04/08/2012   Tobacco abuse 04/08/2012   Acute bronchitis 04/08/2012   CAD (coronary artery disease) 04/06/2012   Malignant neoplasm of unspecified part of unspecified bronchus or lung 04/06/2012   Mitral regurgitation 04/06/2012   Cocaine abuse 04/06/2012   PCP:  Estevan Oaks, NP Pharmacy:   [id number] - [place], [place] - [address] AT [address] [place] [address] Phone: [phone] Fax: [phone]     Social Determinants of Health (SDOH) Social History: SDOH Screenings   Food Insecurity: No Food Insecurity (02/19/2023)  Recent Concern: Food Insecurity - Food Insecurity Present (02/05/2023)  Housing: Low Risk  (02/19/2023)  Transportation Needs: No Transportation Needs (02/19/2023)  Utilities: Not At Risk (02/19/2023)  Tobacco Use: Medium Risk (02/24/2023)   SDOH Interventions: Food Insecurity Interventions: Intervention Not Indicated Housing Interventions: Intervention Not Indicated Transportation Interventions:  Intervention Not Indicated Utilities Interventions: Intervention Not Indicated   Readmission Risk Interventions    02/22/2023    4:33 PM 02/09/2023   11:15 AM 02/07/2023    2:23 PM  Readmission Risk Prevention Plan  Transportation Screening Complete Complete Complete  Medication Review Oceanographer) Complete Complete Complete  PCP or Specialist appointment within 3-5 days of discharge Complete Complete Complete  HRI or Home Care Consult Complete  Complete  SW Recovery Care/Counseling Consult Complete  Complete  Palliative Care Screening Complete  Not Applicable  Skilled Nursing Facility Not Applicable  Not Applicable

## 2023-02-22 NOTE — Progress Notes (Signed)
SLP Cancellation Note  Patient Details Name: Ronald [name] MRN: [id number] DOB: Aug 03, 1956   Cancelled treatment:       Reason Eval/Treat Not Completed: Other (comment);Medical issues which prohibited therapy (RN reports pt put back on Bipap for respiratory support)  Rolena Infante, MS [facility] SLP Acute Rehab Services Office [phone]   Chales Abrahams 02/22/2023, 10:50 AM

## 2023-02-22 NOTE — Progress Notes (Signed)
NAME:  Ronald [name], MRN:  [id number], DOB:  [birth year], LOS: 4 ADMISSION DATE:  03/07/2023, CONSULTATION DATE:  02/21/23 REFERRING MD:  Dr. Benjamine Mola, CHIEF COMPLAINT:  Agitation, SOB   History of Present Illness:  67 y/o M who presented to [facility] on 4/5 with reports of shortness of breath and chest pain.    Patient was recently admitted from 3/22-3/28 in the setting of wheezing and productive cough.  Viral workup at that time was negative.  CTA of the chest showed emphysematous changes, possible infiltrate in the right lower lobe and stable right middle lobe spiculated nodule.  He was admitted and treated for community-acquired pneumonia with ceftriaxone and azithromycin.  Palliative care was consulted during that hospitalization.  He had a new reduction in his ejection fraction to 45 to 50%.  Cardiology was consulted.  Home medications held due to soft blood pressures.  On presentation the patient reported rather acute onset of chest pain and shortness of breath.  He reported associated productive cough with yellow sputum.  Initial cardiac workup with a negative EKG, high-sensitivity troponin mildly elevated.  Elevated troponin was thought secondary to demand ischemia in the setting of respiratory distress.  CXR 4/5 showed increased interstitial opacities in the right hemithorax concerning for edema versus infection.  Subsequent CT angio chest was negative for PE but demonstrated patchy airspace opacities in the right upper lobe new since 3/22 exam, diffuse interlobular septal thickening throughout the right lung, unchanged spiculated right middle lobe mass measuring 2 cm, and right hilar node measuring 1.5 cm.  Patient was admitted per [facility] and placed on empiric antibiotics with Rocephin and azithromycin.  He was positive for meta-pneumovirus. Hospital course complicated by hypotension in the setting of diuresis.  He required albumin and midodrine.  On 4/8 the patient looked well and was  pending transfer to medical floor.  He received a bath while in stepdown and subsequently became agitated and had respiratory distress.  He was given IV Ativan, Lasix and IV steroids for respiratory distress and wheezing.  PCCM consulted for evaluation.  Pertinent  Medical History  Tobacco abuse, COPD, Non-small cell lung cancer -Dx 2005, s/p chemo & XRT s/p left pneumonectomy and followed by Dr. [name], Vocal cord surgery, CAD s/p MI, Moderate mitral and tricuspid valve regurgitation. HLD, HTN, CHF  Significant Hospital Events: Including procedures, antibiotic start and stop dates in addition to other pertinent events   4/05 admitted with shortness of breath 4/08 agitated and hypoxic with upper airway wheezing, started on Bipap, PCCM consulted   Interim History / Subjective:  He doesn't remember what happened yesterday afternoon.  Wore Bipap overnight w/o difficulty.  Reports he always makes some wheezy noises from his throat.  Objective   Blood pressure 102/63, pulse 85, temperature (!) 96 F (35.6 C), temperature source Axillary, resp. rate (!) 25, height 5' (1.524 m), weight 58.1 kg, SpO2 98 %.    FiO2 (%):  [35 %] 35 %   Intake/Output Summary (Last 24 hours) at 02/22/2023 0801 Last data filed at 02/22/2023 0500 Gross per 24 hour  Intake 440.53 ml  Output 1050 ml  Net -609.47 ml   Filed Weights   02/20/23 0141 02/21/23 0500 02/22/23 0500  Weight: 63.3 kg 58.5 kg 58.1 kg    Examination:  General - alert Eyes - pupils reactive ENT - wearing Bipap, mild stridor Cardiac - regular rate/rhythm, 2/6 murmur Chest - faint wheeze in Rt chest, absent BS in Lt chest Abdomen -  soft, non tender, + bowel sounds Extremities - no cyanosis, clubbing, or edema Skin - no rashes Neuro - normal strength, moves extremities, follows commands  Resolved Hospital Problem list      Assessment & Plan:   Acute on chronic hypoxic respiratory failure. - from Viral/Bacterial pneumonia, stridor,  COPD exacerbation, and ?pneumonitis from [place] (seems less likely) - can transition back to nasal cannula oxygen with prn Bipap (target Vt 4 to 6 cc/kg in setting of Lt pneumonectomy) - continue to monitor need for intubation in ICU - goal SpO2 > 92%   Meta-pneumovirus pneumonia with right upper lobe bacterial pneumonia superinfection. - day 5 of Abx, currently on cefepime and zithromax   COPD exacerbation. - continue yupelri, brovana, pulmicort - continue singulair - prn albuterol - continue solumedrol 40 mg q12h  Stridor with hx of vocal cord surgery. - continue solumedrol - prn Bipap -might need ENT assessment at some point  Elevated troponin likely from demand ischemia. Mod/severe MR. Acute on chronic diastolic CHF. Chronic hypotension. HLD. - Echo 02/19/23 >> EF 50 to 55%, grade 2 DD, mod RV dysfunction, mod RA/LA dilation, mod MR, mod TR, mild AR - [facility] by cardiothoracic surgery >> not a candidate for valve surgery - continue ASA, midodrine, crestor   Hypothyroidism. - reportedly non compliant with medication - per primary team   Acute metabolic encephalopathy from hypoxia, pneumonia. Hx of neuropathy. - prn ativan - hold gabapentin  Lung cancer. - Stage 3a NSCLC in 2004 s/p neoadjuvant concurrent chemoradiation and Lt pneumonectomy - Stage 4 NSCLC in March 2023 in [facility] and [facility] with mediastinal and [place] nodal involvement and splenic metastasis - currently treated with keytruda 200 mg IV q3weeks and s/p 5 cycles - followed by Dr. Arbutus PedMohamed with oncology  Hypothyroidism with Non-Compliance  -Synthroid per [facility]  Dysphagia. - speech therapy to assess, and then advance diet  Goals of care. - palliative care consulted  Best Practice (right click and "Reselect all SmartList Selections" daily)   Diet/type: NPO DVT prophylaxis: LMWH GI prophylaxis: N/A Lines: Central line >> has a port Foley:  Yes, and it is still needed Code Status:  full  code Last date of multidisciplinary goals of care discussion [x]   Labs       Latest Ref Rng & Units 02/22/2023    5:42 AM 02/20/2023    6:03 AM 02/19/2023    2:14 AM  CMP  Glucose 70 - 99 mg/dL 409188  811101  82   BUN 8 - 23 mg/dL 33  28  29   Creatinine 0.61 - 1.24 mg/dL 9.141.51  7.821.46  9.561.34   Sodium 135 - 145 mmol/L 135  132  135   Potassium 3.5 - 5.1 mmol/L 4.2  4.0  3.5   Chloride 98 - 111 mmol/L 102  102  100   CO2 22 - 32 mmol/L 21  23  24    Calcium 8.9 - 10.3 mg/dL 8.9  8.2  8.5   Total Protein 6.5 - 8.1 g/dL   6.9   Total Bilirubin 0.3 - 1.2 mg/dL   1.2   Alkaline Phos 38 - 126 U/L   93   AST 15 - 41 U/L   33   ALT 0 - 44 U/L   41        Latest Ref Rng & Units 02/22/2023    5:42 AM 02/20/2023    6:03 AM 02/19/2023    2:14 AM  CBC  WBC 4.0 -  10.5 K/uL 3.3  5.6  8.2   Hemoglobin 13.0 - 17.0 g/dL 32.2  02.5  42.7   Hematocrit 39.0 - 52.0 % 32.1  34.3  40.8   Platelets 150 - 400 K/uL 164  177  235     ABG    Component Value Date/Time   PHART 7.414 04/06/2012 0120   PCO2ART 32.2 (L) 04/06/2012 0120   PO2ART 72.4 (L) 04/06/2012 0120   HCO3 25.4 02/22/2023 0542   TCO2 24 12/29/2017 2300   ACIDBASEDEF 1.6 04/30/2015 2235   O2SAT 58 02/22/2023 0542    CBG (last 3)  No results for input(s): "GLUCAP" in the last 72 hours.    Critical care time: 32 minutes  Coralyn Helling, MD [facility] Pulmonary/Critical Care Pager - [phone] or [phone] 02/22/2023, 8:01 AM

## 2023-02-22 NOTE — Evaluation (Signed)
SLP Cancellation Note  Patient Details Name: Ronald [name] MRN: [id number] DOB: [birth year]   Cancelled treatment:       Reason Eval/Treat Not Completed: Other (comment) (pt needing bowel movement)   Chales Abrahams 02/22/2023, 9:28 AM   Rolena Infante, MS [facility] SLP Acute Rehab Services Office [phone]

## 2023-02-23 LAB — BLOOD GAS, ARTERIAL
Acid-base deficit: 7.4 mmol/L — ABNORMAL HIGH (ref 0.0–2.0)
Bicarbonate: 18.1 mmol/L — ABNORMAL LOW (ref 20.0–28.0)
Drawn by: 422461
FIO2: 100 %
MECHVT: 400 mL
O2 Saturation: 100 %
PEEP: 8 cmH2O
Patient temperature: 36.3
RATE: 35 resp/min
pCO2 arterial: 35 mmHg (ref 32–48)
pH, Arterial: 7.32 — ABNORMAL LOW (ref 7.35–7.45)
pO2, Arterial: 394 mmHg — ABNORMAL HIGH (ref 83–108)

## 2023-02-23 MED ORDER — ACETAMINOPHEN 650 MG RE SUPP: 650.0000 mg | Freq: Four times a day (QID) | RECTAL | Status: DC | PRN

## 2023-02-23 MED ORDER — LORAZEPAM 2 MG/ML IJ SOLN
0.5000 mg | INTRAMUSCULAR | Status: DC | PRN
  Administered 2023-02-23: 2 mg via INTRAVENOUS
  Filled 2023-02-23: qty 1

## 2023-02-23 MED ORDER — HALOPERIDOL LACTATE 2 MG/ML PO CONC: 0.5000 mg | ORAL | Status: DC | PRN

## 2023-02-23 MED ORDER — SODIUM CHLORIDE 0.9% FLUSH: 3.0000 mL | Freq: Two times a day (BID) | INTRAVENOUS | Status: DC

## 2023-02-23 MED ORDER — ONDANSETRON 4 MG PO TBDP: 4.0000 mg | ORAL_TABLET | Freq: Four times a day (QID) | ORAL | Status: DC | PRN

## 2023-02-23 MED ORDER — ACETAMINOPHEN 325 MG PO TABS: 650.0000 mg | ORAL_TABLET | Freq: Four times a day (QID) | ORAL | Status: DC | PRN

## 2023-02-23 MED ORDER — MORPHINE SULFATE (PF) 2 MG/ML IV SOLN
1.0000 mg | INTRAVENOUS | Status: DC | PRN
  Administered 2023-02-23: 4 mg via INTRAVENOUS
  Filled 2023-02-23: qty 2

## 2023-02-23 MED ORDER — GLYCOPYRROLATE 0.2 MG/ML IJ SOLN
0.2000 mg | INTRAMUSCULAR | Status: DC | PRN
  Administered 2023-02-23: 0.2 mg via INTRAVENOUS
  Filled 2023-02-23: qty 1

## 2023-02-23 MED ORDER — SODIUM CHLORIDE 0.9 % IV SOLN: 250.0000 mL | INTRAVENOUS | Status: DC | PRN

## 2023-02-23 MED ORDER — GLYCOPYRROLATE 0.2 MG/ML IJ SOLN: 0.2000 mg | INTRAMUSCULAR | Status: DC | PRN

## 2023-02-23 MED ORDER — ONDANSETRON HCL 4 MG/2ML IJ SOLN: 4.0000 mg | Freq: Four times a day (QID) | INTRAMUSCULAR | Status: DC | PRN

## 2023-02-23 MED ORDER — HALOPERIDOL 1 MG PO TABS: 0.5000 mg | ORAL_TABLET | ORAL | Status: DC | PRN

## 2023-02-23 MED ORDER — GLYCOPYRROLATE 1 MG PO TABS: 1.0000 mg | ORAL_TABLET | ORAL | Status: DC | PRN

## 2023-02-23 MED ORDER — POLYVINYL ALCOHOL 1.4 % OP SOLN: 1.0000 [drp] | Freq: Four times a day (QID) | OPHTHALMIC | Status: DC | PRN

## 2023-02-23 MED ORDER — HALOPERIDOL LACTATE 5 MG/ML IJ SOLN: 0.5000 mg | INTRAMUSCULAR | Status: DC | PRN

## 2023-02-23 MED ORDER — SODIUM CHLORIDE 0.9% FLUSH: 3.0000 mL | INTRAVENOUS | Status: DC | PRN

## 2023-02-23 MED ORDER — BIOTENE DRY MOUTH MT LIQD: 15.0000 mL | OROMUCOSAL | Status: DC | PRN

## 2023-02-23 MED FILL — Medication: Qty: 1 | Status: AC

## 2023-02-27 DIAGNOSIS — J123 Human metapneumovirus pneumonia: Secondary | ICD-10-CM | POA: Insufficient documentation

## 2023-03-09 ENCOUNTER — Ambulatory Visit: Payer: 59 | Admitting: Internal Medicine

## 2023-03-09 ENCOUNTER — Other Ambulatory Visit: Payer: 59

## 2023-03-09 ENCOUNTER — Ambulatory Visit: Payer: 59

## 2023-03-16 NOTE — Progress Notes (Signed)
Contacted by E-Link CCM Dr. Warrick Parisian regarding working the patient's Vt incrementally down from to no that he is more stable.  Tidal volume adjustment made over the span of 30 minutes.  Per MD instructions, will get the ABG about 1 hour following tidal volume adjustment.

## 2023-03-16 NOTE — Death Summary Note (Signed)
DEATH SUMMARY   Patient Details  Name: Ronald [name] MRN: [id number] DOB: Aug 04, 1956 Ronald [name]:[id number], Ronald Nieves, NP Admission/Discharge Information   Admit Date:  03-03-2023  Date of Death: Date of Death: 2023-03-08  Time of Death: Time of Death: 0406  Length of Stay: 5   Principle Cause of death: acute respiratory failure  Hospital Diagnoses: Principal Problem:   Pneumonia Active Problems:   Malignant neoplasm of unspecified part of unspecified bronchus or lung   Tobacco abuse   COPD with acute exacerbation (HCC)   Acute respiratory failure with hypoxia   History of pneumonectomy   Pneumonia due to human metapneumovirus     Assessment and Plan: [name] is a 67 y.o. male with medical history significant for chronic combined diastolic and systolic CHF, non-small cell lung cancer status post left pneumonectomy/chemotherapy/radiation, moderate mitral and tricuspid valves regurgitation, hyperlipidemia, recently admitted for right lower lobe pneumonia who presented to [facility] ED from home with complaints of chest pain and shortness of breath.  Found to have human meta pneumovirus.  Requiring Bipap due to respiratory distress.  PCCM consulted.    Night of 4/13: ER MD was called to the bedside around 10 PM because patient apparently lost pulses.  Per the nurse, patient was on BiPAP and took off his BiPAP and refused to be put back on it.  He then became very hypoxic and lost pulses.  ACLS was initiated prior to my arrival. Upon arrival, patient was apneic and very difficult to bag.  Patient was intubated.  Patient also regain ROSC within several doses of epinephrine.  Total downtime was less than 20 minutes.  Repeat EKG shows sinus tachycardia with no obvious arrhythmia.  Chest x-ray confirmed ET tube placement.  Patient also was started on propofol drip as well as Levophed.  Patient was then made DNR by wife and transitioned to comfort care.     Consultations: PCCM, palliative  care  The results of significant diagnostics from this hospitalization (including imaging, microbiology, ancillary and laboratory) are listed below for reference.   Significant Diagnostic Studies: DG CHEST PORT 1 VIEW  Result Date: 02/22/2023 CLINICAL DATA:  Intubated EXAM: PORTABLE CHEST 1 VIEW COMPARISON:  02/21/2023, CT 03-03-2023, 12/29/2017 FINDINGS: Interval intubation, tip of the endotracheal tube is about 1 cm superior to the carina. Left-sided central venous port tip over the SVC. Volume loss on the left with diffuse opacity corresponding to history of pneumonectomy. Fiducial markers in the right infrahilar lung, adjacent to CT demonstrated right middle lobe mass. Right hilar fullness corresponding to enlarged hilar nodes on CT. Diffuse interstitial and mild ground-glass opacity in the right thorax slightly improved. IMPRESSION: 1. Endotracheal tube tip about 1 cm superior to the carina. 2. Diffuse interstitial and mild ground-glass opacity in the right thorax, slightly improved compared to most recent prior. 3. Pneumonectomy changes on the left. Electronically Signed   By: Jasmine Pang M.D.   On: 02/22/2023 23:07   DG CHEST PORT 1 VIEW  Result Date: 02/21/2023 CLINICAL DATA:  Hypoxia EXAM: PORTABLE CHEST 1 VIEW COMPARISON:  03/03/2023 FINDINGS: Cardiac shadow is stable. Left chest wall port is again seen in satisfactory position. The right lung is mildly hyperinflated. Fiducial markers are again noted. Increasing interstitial changes are noted. Volume loss and opacification of left hemithorax is again noted. IMPRESSION: Increased interstitial changes throughout the right lung favoring edema although the possibility of lymphangitic spread could not be totally excluded. Postsurgical changes on the left. Electronically Signed  By: Alcide Clever M.D.   On: 02/21/2023 20:32   ECHOCARDIOGRAM LIMITED  Result Date: 02/19/2023    ECHOCARDIOGRAM LIMITED REPORT   Patient Name:   Ronald [name] Date of  Exam: 02/19/2023 Medical Rec #:  [id number]         Height:       60.0 in Accession #:    [id number]        Weight:       136.0 lb Date of Birth:  1956-09-20         BSA:          1.585 m Patient Age:    66 years          BP:           103/52 mmHg Patient Gender: M                 HR:           73 bpm. Exam Location:  Inpatient Procedure: Limited Echo, Limited Color Doppler and Cardiac Doppler Indications:    I50.21 CHF  History:        Patient has prior history of Echocardiogram examinations, most                 recent 02/06/2023. CHF, Previous Myocardial Infarction and CAD,                 COPD; Risk Factors:Hypertension.  Sonographer:    Dondra Prader RVT RCS Referring Phys: [id number] CAROLE N HALL IMPRESSIONS  1. Limited study; hypokinesis of the inferolateral wall; overall low normal LV function.  2. Left ventricular ejection fraction, by estimation, is 50 to 55%. The left ventricle has low normal function. The left ventricle demonstrates regional wall motion abnormalities (see scoring diagram/findings for description). Left ventricular diastolic  parameters are consistent with Grade II diastolic dysfunction (pseudonormalization). Elevated left atrial pressure.  3. Right ventricular systolic function is moderately reduced. The right ventricular size is moderately enlarged.  4. Left atrial size was moderately dilated.  5. Right atrial size was moderately dilated.  6. The mitral valve is normal in structure. Moderate mitral valve regurgitation. No evidence of mitral stenosis.  7. Tricuspid valve regurgitation is moderate.  8. The aortic valve is tricuspid. Aortic valve regurgitation is mild. Aortic valve sclerosis is present, with no evidence of aortic valve stenosis.  9. The inferior vena cava is normal in size with greater than 50% respiratory variability, suggesting right atrial pressure of 3 mmHg. FINDINGS  Left Ventricle: Left ventricular ejection fraction, by estimation, is 50 to 55%. The left ventricle has low  normal function. The left ventricle demonstrates regional wall motion abnormalities. The left ventricular internal cavity size was normal in size. There is no left ventricular hypertrophy. Left ventricular diastolic parameters are consistent with Grade II diastolic dysfunction (pseudonormalization). Elevated left atrial pressure. Right Ventricle: The right ventricular size is moderately enlarged. Right ventricular systolic function is moderately reduced. Left Atrium: Left atrial size was moderately dilated. Right Atrium: Right atrial size was moderately dilated. Pericardium: There is no evidence of pericardial effusion. Mitral Valve: The mitral valve is normal in structure. Moderate mitral valve regurgitation. No evidence of mitral valve stenosis. Tricuspid Valve: The tricuspid valve is normal in structure. Tricuspid valve regurgitation is moderate . No evidence of tricuspid stenosis. Aortic Valve: The aortic valve is tricuspid. Aortic valve regurgitation is mild. Aortic valve sclerosis is present, with no evidence of aortic valve stenosis. Pulmonic Valve: The pulmonic valve  was grossly normal. Aorta: The aortic root is normal in size and structure. Venous: The inferior vena cava is normal in size with greater than 50% respiratory variability, suggesting right atrial pressure of 3 mmHg. Additional Comments: Limited study; hypokinesis of the inferolateral wall; overall low normal LV function.  LEFT VENTRICLE PLAX 2D LVIDd:         4.90 cm     Diastology LVIDs:         3.50 cm     LV e' medial:    3.83 cm/s LV PW:         1.10 cm     LV E/e' medial:  26.4 LV IVS:        0.80 cm     LV e' lateral:   5.23 cm/s                            LV E/e' lateral: 19.3  LV Volumes (MOD) LV vol d, MOD A4C: 81.0 ml LV vol s, MOD A4C: 34.8 ml LV SV MOD A4C:     81.0 ml RIGHT VENTRICLE            IVC RV S prime:     7.90 cm/s  IVC diam: 1.40 cm TAPSE (M-mode): 1.8 cm LEFT ATRIUM         Index LA diam:    3.70 cm 2.34 cm/m  MITRAL  VALVE                TRICUSPID VALVE MV Area (PHT): 3.27 cm     TR Peak grad:   38.9 mmHg MV Decel Time: 232 msec     TR Vmax:        312.00 cm/s MV E velocity: 101.00 cm/s MV A velocity: 89.20 cm/s MV E/A ratio:  1.13 Olga Millers MD Electronically signed by Olga Millers MD Signature Date/Time: 02/19/2023/12:30:59 PM    Final    CT Angio Chest PE W and/or Wo Contrast  Result Date: 03/14/2023 CLINICAL DATA:  Patient is here for evaluation of right chest/lung pain and shortness of breath. Stage IV lung cancer status post left pneumonectomy. EXAM: CT ANGIOGRAPHY CHEST WITH CONTRAST TECHNIQUE: Multidetector CT imaging of the chest was performed using the standard protocol during bolus administration of intravenous contrast. Multiplanar CT image reconstructions and MIPs were obtained to evaluate the vascular anatomy. RADIATION DOSE REDUCTION: This exam was performed according to the departmental dose-optimization program which includes automated exposure control, adjustment of the mA and/or kV according to patient size and/or use of iterative reconstruction technique. CONTRAST:  75mL OMNIPAQUE IOHEXOL 350 MG/ML SOLN COMPARISON:  Radiographs 03/10/2023 and CT chest 02/04/2023 FINDINGS: Cardiovascular: Satisfactory opacification of the pulmonary arteries to the segmental level. No evidence of pulmonary embolism. Normal heart size. Trace pericardial effusion. Coronary stenting and atherosclerotic calcification. Aortic calcification. Reflux of contrast into the IVC and hepatic veins compatible with elevated right heart pressures. The main pulmonary artery is mildly dilated measuring 34 mm in maximum diameter. Mediastinum/Nodes: Unchanged 1.5 cm right hilar node. No mediastinal or axillary adenopathy. Unremarkable esophagus. Lungs/Pleura: Left pneumonectomy. Diffuse interlobular septal thickening. Patchy airspace opacities in the right upper lobe are new since 02/04/2023 (6/41 and 6/51). Unchanged spiculated right  middle lobe mass measuring 2.0 cm (6/90). Fiducial markers are present adjacent to the mass. No pleural effusion or pneumothorax. Upper Abdomen: No acute abnormality. Musculoskeletal: Postsurgical changes left ribs. No acute osseous findings. Review of the MIP images  confirms the above findings. IMPRESSION: 1. No evidence of pulmonary embolism. 2. Patchy airspace opacities in the right upper lobe, new since 02/04/2023, concerning for pneumonia. Recommend follow-up chest CT in 3 months to ensure resolution. 3. Diffuse interlobular septal thickening throughout the right lung. Favor edema though infection or lymphangitic spread of carcinoma could appear similarly. 4. Unchanged spiculated right middle lobe mass measuring 2.0 cm. Unchanged 1.5 cm right hilar node. 5. Aortic atherosclerosis. Aortic Atherosclerosis (ICD10-I70.0). Electronically Signed   By: Minerva Fester M.D.   On: 02/25/2023 21:25   DG Chest 2 View  Result Date: 03/14/2023 CLINICAL DATA:  Shortness of breath and chest pain EXAM: CHEST - 2 VIEW COMPARISON:  Chest x-ray dated February 08, 2023 FINDINGS: Stable findings of prior left pneumonectomy. Limited evaluation of the cardiac and mediastinal contours due to mediastinal shift. Stable left chest wall port. Increased interstitial opacities of the right hemithorax. No evidence of pleural effusion or pneumothorax. IMPRESSION: Increased interstitial opacities of the right hemithorax, concerning for pulmonary edema or infection. Electronically Signed   By: Allegra Lai M.D.   On: 03/01/2023 19:01   DG CHEST PORT 1 VIEW  Result Date: 02/08/2023 CLINICAL DATA:  Short of breath EXAM: PORTABLE CHEST 1 VIEW COMPARISON:  02/04/2023 FINDINGS: Single frontal view of the chest demonstrates stable postsurgical changes from left pneumonectomy. Stable left chest wall port, tip overlying superior vena cava. Increased interstitial prominence throughout the right lung is unchanged, with stable patchy consolidation  at the right lung base and fiduciary markers again noted. No right effusion or pneumothorax. Stable postsurgical changes from left thoracotomy. IMPRESSION: 1. Chronic interstitial scarring throughout the right lung. No superimposed pneumonia. 2. Continued patchy right basilar consolidation and underlying fiduciary markers, compatible with patient's known history of spiculated right middle lobe nodule seen on recent CT. Electronically Signed   By: Sharlet Salina M.D.   On: 02/08/2023 10:06   US RENAL  Result Date: 02/07/2023 CLINICAL DATA:  Renal dysfunction EXAM: RENAL / URINARY TRACT ULTRASOUND COMPLETE COMPARISON:  Abdomen sonogram done on 03/06/2015, CT done on 01/27/2023 FINDINGS: Right Kidney: Renal measurements: 10.1 x 5.5 x 5.5 cm = volume: 147.4 mL. There is no hydronephrosis. There is no focal cortical thinning. There is increased cortical echogenicity. Left Kidney: Renal measurements: 9.6 x 4.7 x 4.6 cm = volume: 108.1 mL. There is increased cortical echogenicity. There is no hydronephrosis or perinephric fluid collection. Bladder: Appears normal for degree of bladder distention. Other: None. IMPRESSION: There is no hydronephrosis. There is increased cortical echogenicity in both kidneys which may be a technical artifact or suggestive medical renal disease. Electronically Signed   By: Ernie Avena M.D.   On: 02/07/2023 15:08   ECHOCARDIOGRAM COMPLETE  Result Date: 02/06/2023    ECHOCARDIOGRAM REPORT   Patient Name:   KAINOAH BARTOSIEWICZ Date of Exam: 02/06/2023 Medical Rec #:  [id number]         Height:       60.0 in Accession #:    [id number]        Weight:       151.7 lb Date of Birth:  [birth year]         BSA:          1.660 m Patient Age:    66 years          BP:           105/77 mmHg Patient Gender: M  HR:           86 bpm. Exam Location:  Inpatient Procedure: 2D Echo, Color Doppler and Cardiac Doppler Indications:    CAD Native Vessel I25.10, Congestive Heart Failure I50.9   History:        Patient has prior history of Echocardiogram examinations. CAD                 and Previous Myocardial Infarction, COPD; Risk                 Factors:Hypertension.  Sonographer:    Mike Gip Referring Phys: [id number] DAVID MANUEL ORTIZ IMPRESSIONS  1. Left ventricular ejection fraction, by estimation, is 45 to 50%. The left ventricle has mildly decreased function. The left ventricle demonstrates global hypokinesis. The left ventricular internal cavity size was mildly dilated. Left ventricular diastolic parameters are consistent with Grade II diastolic dysfunction (pseudonormalization). Elevated left ventricular end-diastolic pressure.  2. Right ventricular systolic function is moderately reduced. The right ventricular size is moderately enlarged.  3. Left atrial size was moderately dilated.  4. Right atrial size was moderately dilated.  5. The mitral valve is abnormal. Moderate mitral valve regurgitation. No evidence of mitral stenosis.  6. Tricuspid valve regurgitation is moderate.  7. The aortic valve is tricuspid. There is mild calcification of the aortic valve. There is mild thickening of the aortic valve. Aortic valve regurgitation is mild. Aortic valve sclerosis is present, with no evidence of aortic valve stenosis.  8. The inferior vena cava is normal in size with greater than 50% respiratory variability, suggesting right atrial pressure of 3 mmHg. FINDINGS  Left Ventricle: Left ventricular ejection fraction, by estimation, is 45 to 50%. The left ventricle has mildly decreased function. The left ventricle demonstrates global hypokinesis. The left ventricular internal cavity size was mildly dilated. There is  no left ventricular hypertrophy. Left ventricular diastolic parameters are consistent with Grade II diastolic dysfunction (pseudonormalization). Elevated left ventricular end-diastolic pressure. Right Ventricle: The right ventricular size is moderately enlarged. No increase in right  ventricular wall thickness. Right ventricular systolic function is moderately reduced. Left Atrium: Left atrial size was moderately dilated. Right Atrium: Right atrial size was moderately dilated. Pericardium: There is no evidence of pericardial effusion. Mitral Valve: Restricted posterior leaflet motion and calcified sub chordal apparatus. The mitral valve is abnormal. There is mild thickening of the mitral valve leaflet(s). There is mild calcification of the mitral valve leaflet(s). Mild mitral annular calcification. Moderate mitral valve regurgitation. No evidence of mitral valve stenosis. Tricuspid Valve: The tricuspid valve is normal in structure. Tricuspid valve regurgitation is moderate . No evidence of tricuspid stenosis. Aortic Valve: The aortic valve is tricuspid. There is mild calcification of the aortic valve. There is mild thickening of the aortic valve. Aortic valve regurgitation is mild. Aortic regurgitation PHT measures 512 msec. Aortic valve sclerosis is present,  with no evidence of aortic valve stenosis. Pulmonic Valve: The pulmonic valve was normal in structure. Pulmonic valve regurgitation is not visualized. No evidence of pulmonic stenosis. Aorta: The aortic root is normal in size and structure. Venous: The inferior vena cava is normal in size with greater than 50% respiratory variability, suggesting right atrial pressure of 3 mmHg. IAS/Shunts: No atrial level shunt detected by color flow Doppler.  LEFT VENTRICLE PLAX 2D LVIDd:         4.50 cm      Diastology LVIDs:         3.70 cm  LV e' medial:    3.97 cm/s LV PW:         1.00 cm      LV E/e' medial:  30.0 LV IVS:        1.00 cm      LV e' lateral:   3.73 cm/s LVOT diam:     2.10 cm      LV E/e' lateral: 31.9 LV SV:         48 LV SV Index:   29 LVOT Area:     3.46 cm  LV Volumes (MOD) LV vol d, MOD A2C: 137.0 ml LV vol d, MOD A4C: 93.8 ml LV vol s, MOD A2C: 70.8 ml LV vol s, MOD A4C: 51.4 ml LV SV MOD A2C:     66.2 ml LV SV MOD A4C:      93.8 ml LV SV MOD BP:      52.2 ml RIGHT VENTRICLE RV Basal diam:  5.40 cm RV S prime:     10.90 cm/s TAPSE (M-mode): 2.0 cm LEFT ATRIUM             Index        RIGHT ATRIUM           Index LA diam:        4.10 cm 2.47 cm/m   RA Area:     21.30 cm LA Vol (A2C):   69.2 ml 41.70 ml/m  RA Volume:   67.90 ml  40.91 ml/m LA Vol (A4C):   51.3 ml 30.91 ml/m LA Biplane Vol: 61.2 ml 36.88 ml/m  AORTIC VALVE LVOT Vmax:   77.20 cm/s LVOT Vmean:  51.800 cm/s LVOT VTI:    0.139 m AI PHT:      512 msec  AORTA Ao Root diam: 3.20 cm Ao Asc diam:  3.20 cm MITRAL VALVE                  TRICUSPID VALVE MV Area (PHT): 4.83 cm       TR Peak grad:   49.8 mmHg MV Decel Time: 157 msec       TR Vmax:        353.00 cm/s MR Peak grad:    88.0 mmHg MR Mean grad:    56.0 mmHg    SHUNTS MR Vmax:         469.00 cm/s  Systemic VTI:  0.14 m MR Vmean:        348.0 cm/s   Systemic Diam: 2.10 cm MR PISA:         2.26 cm MR PISA Eff ROA: 15 mm MR PISA Radius:  0.60 cm MV E velocity: 119.00 cm/s MV A velocity: 92.20 cm/s MV E/A ratio:  1.29 Charlton Haws MD Electronically signed by Charlton Haws MD Signature Date/Time: 02/06/2023/4:52:00 PM    Final    DG Knee 1-2 Views Left  Result Date: 02/06/2023 CLINICAL DATA:  [id number] Swelling [id number] EXAM: LEFT KNEE - 1-2 VIEW COMPARISON:  None Available. FINDINGS: No acute fracture or dislocation. Mild degenerative changes of the medial compartment with joint space narrowing. No area of erosion or osseous destruction. No unexpected radiopaque foreign body. Vascular calcifications. No significant joint effusion. IMPRESSION: 1. No acute fracture or dislocation. 2. Mild degenerative changes of the medial compartment. Electronically Signed   By: Meda Klinefelter M.D.   On: 02/06/2023 11:58   CT Angio Chest PE W and/or Wo Contrast  Result Date: 02/04/2023 CLINICAL DATA:  Chest pain shortness  of breath EXAM: CT ANGIOGRAPHY CHEST WITH CONTRAST TECHNIQUE: Multidetector CT imaging of the chest was performed  using the standard protocol during bolus administration of intravenous contrast. Multiplanar CT image reconstructions and MIPs were obtained to evaluate the vascular anatomy. RADIATION DOSE REDUCTION: This exam was performed according to the departmental dose-optimization program which includes automated exposure control, adjustment of the mA and/or kV according to patient size and/or use of iterative reconstruction technique. CONTRAST:  75mL OMNIPAQUE IOHEXOL 350 MG/ML SOLN COMPARISON:  Chest x-ray 02/04/2023, CT chest 01/27/2023, 12/16/2022 FINDINGS: Cardiovascular: Satisfactory opacification of the pulmonary arteries to the segmental level. No evidence of pulmonary embolism on the right. Cardiomegaly. Moderate aortic atherosclerosis without aneurysm. Coronary stent and calcifications. No significant pericardial effusion. Mediastinum/Nodes: Patent trachea. No suspicious thyroid nodule. Shift of mediastinal contents to the left due to history of left pneumonectomy. Esophagus within normal limits. Enlarged right hilar nodes measuring up to 14 mm not significantly changed. Esophagus within normal limits. Lungs/Pleura: Left pneumonectomy with chronic thick-walled fluid in the left thoracic cavity. Emphysema. Diffuse reticular densities throughout the right lung. Spiculated right middle lobe mass measuring 1.6 by 2 cm, previously 2 x 1.8 cm with adjacent fiducial markers. Increased hazy pulmonary density in the right lower lobe compared to prior. Upper Abdomen: No acute finding. Reflux of contrast into the hepatic veins consistent with elevated right heart pressure. Musculoskeletal: Postsurgical changes of the left ribs. No acute or suspicious osseous abnormality. Review of the MIP images confirms the above findings. IMPRESSION: 1. Status post left pneumonectomy. No evidence for acute right pulmonary embolus. 2. Cardiomegaly 3. Emphysema with diffuse interstitial densities throughout the right thorax, similar to  previous exams. Slight increased hazy pulmonary density in the right lower lobe could be due to superimposed infectious or inflammatory process. 4. Grossly stable 2 cm right middle lobe spiculated nodule with adjacent fiducial markers Aortic Atherosclerosis (ICD10-I70.0) and Emphysema (ICD10-J43.9). Electronically Signed   By: Jasmine Pang M.D.   On: 02/04/2023 23:30   DG Chest Portable 1 View  Result Date: 02/04/2023 CLINICAL DATA:  Chest pain, shortness of breath EXAM: PORTABLE CHEST 1 VIEW COMPARISON:  Previous studies including the chest radiograph done on 12/27/2022 and CT done on 01/27/2023 FINDINGS: There is previous left pneumonectomy. There are few metallic clips in right lower lung field, possibly fiducial markers from previous biopsy increased interstitial markings are seen in right lower lung field. There is interval improvement in the aeration in right upper lung field since 01/27/2023. There is no focal consolidation. Right lateral CP angle is clear. There is no pneumothorax. Tip of left IJ central venous catheter is seen in superior vena cava. IMPRESSION: Increased interstitial markings in right lower lung field may suggest scarring or interstitial pneumonia. There is no focal consolidation. There is no right pleural effusion or pneumothorax. Previous left pneumonectomy. Electronically Signed   By: Ernie Avena M.D.   On: 02/04/2023 20:05    Microbiology: Recent Results (from the past 240 hour(s))  Resp panel by RT-PCR (RSV, Flu A&B, Covid) Anterior Nasal Swab     Status: None   Collection Time: 03/13/2023  6:26 PM   Specimen: Anterior Nasal Swab  Result Value Ref Range Status   SARS Coronavirus 2 by RT PCR NEGATIVE NEGATIVE Final    Comment: (NOTE) SARS-CoV-2 target nucleic acids are NOT DETECTED.  The SARS-CoV-2 RNA is generally detectable in upper respiratory specimens during the acute phase of infection. The lowest concentration of SARS-CoV-2 viral copies this assay can  detect  is 138 copies/mL. A negative result does not preclude SARS-Cov-2 infection and should not be used as the sole basis for treatment or other patient management decisions. A negative result may occur with  improper specimen collection/handling, submission of specimen other than nasopharyngeal swab, presence of viral mutation(s) within the areas targeted by this assay, and inadequate number of viral copies(<138 copies/mL). A negative result must be combined with clinical observations, patient history, and epidemiological information. The expected result is Negative.  Fact Sheet for Patients:  BloggerCourse[employer]com  Fact Sheet for Healthcare Providers:  [employer]  This test is no t yet approved or cleared by the [place] FDA and  has been authorized for detection and/or diagnosis of SARS-CoV-2 by FDA under an Emergency Use Authorization (EUA). This EUA will remain  in effect (meaning this test can be used) for the duration of the COVID-19 declaration under Section 564(b)(1) of the Act, 21 U.S.C.section 360bbb-3(b)(1), unless the authorization is terminated  or revoked sooner.       Influenza A by PCR NEGATIVE NEGATIVE Final   Influenza B by PCR NEGATIVE NEGATIVE Final    Comment: (NOTE) The Xpert Xpress SARS-CoV-2/FLU/RSV plus assay is intended as an aid in the diagnosis of influenza from Nasopharyngeal swab specimens and should not be used as a sole basis for treatment. Nasal washings and aspirates are unacceptable for Xpert Xpress SARS-CoV-2/FLU/RSV testing.  Fact Sheet for Patients: BloggerCourse.com  Fact Sheet for Healthcare Providers: [employer]iousBroker[employer]it  This test is not yet approved or cleared by the [place] FDA and has been authorized for detection and/or diagnosis of SARS-CoV-2 by FDA under an Emergency Use Authorization (EUA). This EUA will remain in  effect (meaning this test can be used) for the duration of the COVID-19 declaration under Section 564(b)(1) of the Act, 21 U.S.C. section 360bbb-3(b)(1), unless the authorization is terminated or revoked.     Resp Syncytial Virus by PCR NEGATIVE NEGATIVE Final    Comment: (NOTE) Fact Sheet for Patients: BloggerCourse.com  Fact Sheet for Healthcare Providers: [employer]iousBroker[employer]it  This test is not yet approved or cleared by the [place] FDA and has been authorized for detection and/or diagnosis of SARS-CoV-2 by FDA under an Emergency Use Authorization (EUA). This EUA will remain in effect (meaning this test can be used) for the duration of the COVID-19 declaration under Section 564(b)(1) of the Act, 21 U.S.C. section 360bbb-3(b)(1), unless the authorization is terminated or revoked.  Performed at [facility], [address] [place], [place] [address]   MRSA Next Gen by PCR, Nasal     Status: None   Collection Time: 02/19/23  2:11 AM   Specimen: Nasal Mucosa; Nasal Swab  Result Value Ref Range Status   MRSA by PCR Next Gen NOT DETECTED NOT DETECTED Final    Comment: (NOTE) The GeneXpert MRSA Assay (FDA approved for NASAL specimens only), is one component of a comprehensive MRSA colonization surveillance program. It is not intended to diagnose MRSA infection nor to guide or monitor treatment for MRSA infections. Test performance is not FDA approved in patients less than 20 years old. Performed at [facility], [address] [place], [place] [address]   Respiratory (~20 pathogens) panel by PCR     Status: Abnormal   Collection Time: 02/20/23 11:39 AM   Specimen: Nasopharyngeal Swab; Respiratory  Result Value Ref Range Status   Adenovirus NOT DETECTED NOT DETECTED Final   Coronavirus 229E NOT DETECTED NOT DETECTED Final    Comment: (NOTE)  The Coronavirus on the Respiratory Panel, DOES  NOT test for the novel  Coronavirus (2019 nCoV)    Coronavirus HKU1 NOT DETECTED NOT DETECTED Final   Coronavirus NL63 NOT DETECTED NOT DETECTED Final   Coronavirus OC43 NOT DETECTED NOT DETECTED Final   Metapneumovirus DETECTED (A) NOT DETECTED Final   Rhinovirus / Enterovirus NOT DETECTED NOT DETECTED Final   Influenza A NOT DETECTED NOT DETECTED Final   Influenza B NOT DETECTED NOT DETECTED Final   Parainfluenza Virus 1 NOT DETECTED NOT DETECTED Final   Parainfluenza Virus 2 NOT DETECTED NOT DETECTED Final   Parainfluenza Virus 3 NOT DETECTED NOT DETECTED Final   Parainfluenza Virus 4 NOT DETECTED NOT DETECTED Final   Respiratory Syncytial Virus NOT DETECTED NOT DETECTED Final   Bordetella pertussis NOT DETECTED NOT DETECTED Final   Bordetella Parapertussis NOT DETECTED NOT DETECTED Final   Chlamydophila pneumoniae NOT DETECTED NOT DETECTED Final   Mycoplasma pneumoniae NOT DETECTED NOT DETECTED Final    Comment: Performed at [facility], [address], [place], [place] [address]    Time spent: 45 minutes  Signed: Joseph Art, DO 02/27/23

## 2023-03-16 NOTE — Progress Notes (Signed)
Time of death 54, 2 RN verified per order. Wife called, family notified. Lines and tubes removed for family to patient.

## 2023-03-16 NOTE — Progress Notes (Signed)
eLink Physician-Brief Progress Note Patient Name: Ronald [name] DOB: September 06, 1956 MRN: [id number]   Date of Service  02/17/2023  HPI/Events of Note  Wife called the bedside RN and requested that patient be transitioned to comfort measures, I called her back and verified the decision.  eICU Interventions  Comfort measures order entered.        [name] 02/20/2023, 1:58 AM

## 2023-03-16 NOTE — Progress Notes (Signed)
eLink Physician-Brief Progress Note Patient Name: Ronald [name] DOB: 11-Aug-1956 MRN: [id number]   Date of Service  02/18/2023  HPI/Events of Note  Family ready for terminal extubation.  eICU Interventions  Comfort orders entered along with terminal extubation order.        Thomasene Lot Aadit Hagood 03/12/2023, 3:28 AM

## 2023-03-16 NOTE — Progress Notes (Signed)
Patient became very restless and agitated, yelling pulling Bipap off. This RN went into the room to try to talk to the patient and deescalate situation. Another RN called into the room and the Elink button pushed. Patient had received ativan less than an hour before agitation episode, see MAR. Both Rns in the room were trying to assist with placing Bipap back on the patient. Patient was combative, swinging arms, and pulling the bipap mask apart. After minutes of unsuccessfully being able to place bipap mask on patient we called for help to grab the code cart. The patient became unresponsive, but still had a pulse. This RN began bagging the patient to try to get O2 sats up. Patient lost pulse at 22:16, code button pushed by RN as other Rn began chest compressions. ED doc came up to assist with code. See code documentation for more info.

## 2023-03-16 NOTE — Progress Notes (Addendum)
eLink Physician-Brief Progress Note Patient Name: Ronald [name] DOB: 08-Jan-1956 MRN: [id number]   Date of Service  03/06/2023  HPI/Events of Note  I  Spoke with Mrs Rachlin and updated her on events of the past evening, she clarified that her husband had indicated that if he were to end up on life support that he would not want to be resuscitated in the event of cardio-pulmonary arrest, she therefore requested that a DNR order be entered, while  continuing to offer treatment as appropriate up to the time of cardiac arrest. I have asked his bedside RN to call Mrs. Wiesner to confirm her wishes.  eICU Interventions  Will continue all indicated treatment until cardiac arrest occurs. DNR order entered.        Thomasene Lot Cliford Sequeira 03/03/2023, 12:07 AM

## 2023-03-16 NOTE — Progress Notes (Addendum)
       Overnight   NAME: Ronald [name] MRN: [id number] DOB : 1956/01/16    Date of Service   03/08/2023   HPI/Events of Note   While rounding in the ICU, patient experienced sudden cardiac arrest. Code was called CPR/ACLS was initiated immediately. ER physician Dr. Silverio Lay arrived and intubated patient quickly.  Patient recovered pulse (please see code sheet for timing and medications) E-Link physician Dr. Warrick Parisian was available on in room monitor.  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  Patient was made comfort care and extubated at the wishes of spouse via Dr Warrick Parisian post code.  2 RN verified.  Family was available to RN.  Ronald [name] MSNA ACNPC-AG Acute Care Nurse Practitioner [facility]        Ronald [name] BSN MSNA MSN ACNPC-AG Acute Care Nurse Practitioner [facility]

## 2023-03-16 NOTE — Progress Notes (Signed)
PT was transported to the morgue by charge nurse and myself via bed. Patient post mortem form completed by night shift. PT placement notified of transfer to morgue. PT was moved from bed onto morgue stretcher and placed in the cooler.

## 2023-03-16 NOTE — Progress Notes (Signed)
Mrs. Folan called to confirm her wishes regarding making her husband a DNR. She stated that in the event of his heart stopping that she did not want [place] to do CPR. She would like [place] to continue what we are doing to maintain him on life support such as the ventilator and medication to support his BP.

## 2023-03-16 NOTE — Progress Notes (Signed)
Patient extubated to 3Lpm nasal cannula.  Comfort care measures initiated prior to extubation.  Patient not in any noticeable discomfort. Family informed the RN that they did not want to be in the room at the time of passing.

## 2023-03-16 NOTE — Progress Notes (Signed)
Spoke with wife, she wants patient to be comfort care and will not be able to be at bedside tonight. Elink called, awaiting compassionate extubation and comfort med orders from ED. Other family present in lobby waiting for passing of the patient.

## 2023-03-16 NOTE — Progress Notes (Signed)
ABG drawn and sent to Lab.  Lab called and notified.

## 2023-03-16 DEATH — deceased

## 2023-03-30 ENCOUNTER — Other Ambulatory Visit: Payer: 59

## 2023-03-30 ENCOUNTER — Ambulatory Visit: Payer: 59 | Admitting: Physician Assistant

## 2023-03-30 ENCOUNTER — Ambulatory Visit: Payer: 59

## 2023-04-20 ENCOUNTER — Ambulatory Visit: Payer: 59 | Admitting: Internal Medicine

## 2023-04-20 ENCOUNTER — Other Ambulatory Visit: Payer: 59

## 2023-04-20 ENCOUNTER — Ambulatory Visit: Payer: 59

## 2023-04-29 ENCOUNTER — Ambulatory Visit: Payer: 59 | Admitting: Internal Medicine
